# Patient Record
Sex: Male | Born: 1935 | Race: White | Hispanic: No | Marital: Married | Smoking: Former smoker
Health system: Southern US, Community
[De-identification: ages and names within clinical notes are randomized; demographics above are authoritative.]

## PROBLEM LIST (undated history)

## (undated) DIAGNOSIS — C61 Malignant neoplasm of prostate: Secondary | ICD-10-CM

## (undated) DIAGNOSIS — E1039 Type 1 diabetes mellitus with other diabetic ophthalmic complication: Secondary | ICD-10-CM

## (undated) DIAGNOSIS — I2510 Atherosclerotic heart disease of native coronary artery without angina pectoris: Secondary | ICD-10-CM

## (undated) DIAGNOSIS — I10 Essential (primary) hypertension: Secondary | ICD-10-CM

## (undated) DIAGNOSIS — E785 Hyperlipidemia, unspecified: Secondary | ICD-10-CM

## (undated) DIAGNOSIS — I6350 Cerebral infarction due to unspecified occlusion or stenosis of unspecified cerebral artery: Secondary | ICD-10-CM

## (undated) DIAGNOSIS — E875 Hyperkalemia: Secondary | ICD-10-CM

## (undated) DIAGNOSIS — K7689 Other specified diseases of liver: Secondary | ICD-10-CM

## (undated) DIAGNOSIS — N289 Disorder of kidney and ureter, unspecified: Secondary | ICD-10-CM

## (undated) DIAGNOSIS — I951 Orthostatic hypotension: Secondary | ICD-10-CM

## (undated) DIAGNOSIS — E11319 Type 2 diabetes mellitus with unspecified diabetic retinopathy without macular edema: Secondary | ICD-10-CM

## (undated) HISTORY — DX: Atherosclerotic heart disease of native coronary artery without angina pectoris: I25.10

## (undated) HISTORY — DX: Type 2 diabetes mellitus with unspecified diabetic retinopathy without macular edema: E11.319

## (undated) HISTORY — DX: Disorder of kidney and ureter, unspecified: N28.9

## (undated) HISTORY — PX: CATARACT EXTRACTION: SUR2

## (undated) HISTORY — PX: CHOLECYSTECTOMY: SHX55

## (undated) HISTORY — DX: Cerebral infarction due to unspecified occlusion or stenosis of unspecified cerebral artery: I63.50

## (undated) HISTORY — DX: Hyperlipidemia, unspecified: E78.5

## (undated) HISTORY — DX: Malignant neoplasm of prostate: C61

## (undated) HISTORY — PX: HERNIA REPAIR: SHX51

## (undated) HISTORY — DX: Hyperkalemia: E87.5

## (undated) HISTORY — DX: Type 1 diabetes mellitus with other diabetic ophthalmic complication: E10.39

## (undated) HISTORY — DX: Essential (primary) hypertension: I10

## (undated) HISTORY — DX: Other specified diseases of liver: K76.89

---

## 1991-06-28 HISTORY — PX: ESOPHAGOGASTRODUODENOSCOPY: SHX1529

## 1999-06-05 ENCOUNTER — Ambulatory Visit (HOSPITAL_COMMUNITY): Admission: RE | Admit: 1999-06-05 | Discharge: 1999-06-05 | Payer: Self-pay | Admitting: Cardiovascular Disease

## 2001-01-22 HISTORY — PX: OTHER SURGICAL HISTORY: SHX169

## 2001-04-10 ENCOUNTER — Encounter: Admission: RE | Admit: 2001-04-10 | Discharge: 2001-04-10 | Payer: Self-pay | Admitting: *Deleted

## 2002-12-03 ENCOUNTER — Encounter: Payer: Self-pay | Admitting: Endocrinology

## 2002-12-03 ENCOUNTER — Encounter: Admission: RE | Admit: 2002-12-03 | Discharge: 2002-12-03 | Payer: Self-pay | Admitting: Endocrinology

## 2002-12-15 ENCOUNTER — Encounter: Admission: RE | Admit: 2002-12-15 | Discharge: 2003-03-15 | Payer: Self-pay | Admitting: Endocrinology

## 2004-09-12 HISTORY — PX: OTHER SURGICAL HISTORY: SHX169

## 2004-10-23 ENCOUNTER — Ambulatory Visit: Payer: Self-pay | Admitting: Cardiology

## 2004-10-26 ENCOUNTER — Ambulatory Visit (HOSPITAL_COMMUNITY): Admission: RE | Admit: 2004-10-26 | Discharge: 2004-10-26 | Payer: Self-pay | Admitting: Cardiology

## 2004-10-29 ENCOUNTER — Ambulatory Visit (HOSPITAL_COMMUNITY): Admission: RE | Admit: 2004-10-29 | Discharge: 2004-10-29 | Payer: Self-pay | Admitting: Cardiology

## 2005-02-05 ENCOUNTER — Ambulatory Visit: Payer: Self-pay | Admitting: Endocrinology

## 2005-02-08 ENCOUNTER — Ambulatory Visit: Payer: Self-pay | Admitting: Endocrinology

## 2005-03-14 ENCOUNTER — Ambulatory Visit: Payer: Self-pay | Admitting: Internal Medicine

## 2005-04-03 ENCOUNTER — Ambulatory Visit: Payer: Self-pay | Admitting: Endocrinology

## 2005-05-15 ENCOUNTER — Ambulatory Visit: Payer: Self-pay | Admitting: Endocrinology

## 2005-05-27 ENCOUNTER — Ambulatory Visit: Admission: RE | Admit: 2005-05-27 | Discharge: 2005-08-28 | Payer: Self-pay | Admitting: Radiation Oncology

## 2005-06-26 ENCOUNTER — Ambulatory Visit: Payer: Self-pay | Admitting: Internal Medicine

## 2005-08-26 ENCOUNTER — Ambulatory Visit: Payer: Self-pay | Admitting: Internal Medicine

## 2005-09-02 ENCOUNTER — Ambulatory Visit: Payer: Self-pay | Admitting: Internal Medicine

## 2005-09-24 ENCOUNTER — Ambulatory Visit: Payer: Self-pay | Admitting: Endocrinology

## 2005-12-09 ENCOUNTER — Ambulatory Visit: Payer: Self-pay | Admitting: Endocrinology

## 2005-12-24 ENCOUNTER — Ambulatory Visit: Payer: Self-pay | Admitting: Endocrinology

## 2006-02-11 ENCOUNTER — Emergency Department (HOSPITAL_COMMUNITY): Admission: EM | Admit: 2006-02-11 | Discharge: 2006-02-11 | Payer: Self-pay | Admitting: Emergency Medicine

## 2006-02-14 ENCOUNTER — Ambulatory Visit: Payer: Self-pay | Admitting: Internal Medicine

## 2006-03-03 ENCOUNTER — Ambulatory Visit: Payer: Self-pay | Admitting: Internal Medicine

## 2006-04-08 ENCOUNTER — Ambulatory Visit: Payer: Self-pay | Admitting: Endocrinology

## 2006-04-16 ENCOUNTER — Ambulatory Visit: Payer: Self-pay | Admitting: Internal Medicine

## 2006-04-18 ENCOUNTER — Ambulatory Visit: Payer: Self-pay | Admitting: Endocrinology

## 2006-06-24 ENCOUNTER — Ambulatory Visit: Payer: Self-pay | Admitting: Endocrinology

## 2006-07-23 ENCOUNTER — Ambulatory Visit: Payer: Self-pay | Admitting: Internal Medicine

## 2006-08-20 ENCOUNTER — Ambulatory Visit: Payer: Self-pay | Admitting: Endocrinology

## 2006-09-29 ENCOUNTER — Ambulatory Visit: Payer: Self-pay | Admitting: Endocrinology

## 2006-09-29 LAB — CONVERTED CEMR LAB
ALT: 35 units/L (ref 0–40)
AST: 49 units/L — ABNORMAL HIGH (ref 0–37)
Albumin: 3.6 g/dL (ref 3.5–5.2)
Alkaline Phosphatase: 87 units/L (ref 39–117)
BUN: 34 mg/dL — ABNORMAL HIGH (ref 6–23)
Bilirubin, Direct: 0.2 mg/dL (ref 0.0–0.3)
CO2: 26 meq/L (ref 19–32)
Calcium: 8.9 mg/dL (ref 8.4–10.5)
Chloride: 112 meq/L (ref 96–112)
Chol/HDL Ratio, serum: 3
Cholesterol: 107 mg/dL (ref 0–200)
Creatinine, Ser: 1.7 mg/dL — ABNORMAL HIGH (ref 0.4–1.5)
GFR calc non Af Amer: 41 mL/min
Glomerular Filtration Rate, Af Am: 50 mL/min/{1.73_m2}
Glucose, Bld: 176 mg/dL — ABNORMAL HIGH (ref 70–99)
HDL: 35.7 mg/dL — ABNORMAL LOW (ref >39.0)
Hgb A1c MFr Bld: 7.4 % — ABNORMAL HIGH (ref 4.6–6.0)
LDL Cholesterol: 61 mg/dL (ref 0–99)
Potassium: 4.8 meq/L (ref 3.5–5.1)
Sodium: 143 meq/L (ref 135–145)
Total Bilirubin: 0.8 mg/dL (ref 0.3–1.2)
Total Protein: 7 g/dL (ref 6.0–8.3)
Triglyceride fasting, serum: 51 mg/dL (ref 0–149)
VLDL: 10 mg/dL (ref 0–40)

## 2006-10-08 ENCOUNTER — Ambulatory Visit: Payer: Self-pay | Admitting: Endocrinology

## 2006-10-31 ENCOUNTER — Ambulatory Visit: Payer: Self-pay | Admitting: Internal Medicine

## 2007-01-14 ENCOUNTER — Ambulatory Visit: Payer: Self-pay | Admitting: Endocrinology

## 2007-01-14 LAB — CONVERTED CEMR LAB
ALT: 27 units/L (ref 0–40)
Bilirubin, Direct: 0.1 mg/dL (ref 0.0–0.3)
HCV Ab: NEGATIVE
Total Bilirubin: 0.9 mg/dL (ref 0.3–1.2)
Total Protein: 7.2 g/dL (ref 6.0–8.3)

## 2007-01-27 ENCOUNTER — Ambulatory Visit: Payer: Self-pay | Admitting: Endocrinology

## 2007-04-15 ENCOUNTER — Ambulatory Visit: Payer: Self-pay | Admitting: Endocrinology

## 2007-04-27 ENCOUNTER — Ambulatory Visit: Payer: Self-pay | Admitting: Endocrinology

## 2007-06-13 ENCOUNTER — Encounter: Payer: Self-pay | Admitting: Endocrinology

## 2007-06-13 DIAGNOSIS — I10 Essential (primary) hypertension: Secondary | ICD-10-CM | POA: Insufficient documentation

## 2007-06-13 DIAGNOSIS — I2510 Atherosclerotic heart disease of native coronary artery without angina pectoris: Secondary | ICD-10-CM

## 2007-06-13 DIAGNOSIS — E785 Hyperlipidemia, unspecified: Secondary | ICD-10-CM

## 2007-06-13 HISTORY — DX: Hyperlipidemia, unspecified: E78.5

## 2007-06-13 HISTORY — DX: Atherosclerotic heart disease of native coronary artery without angina pectoris: I25.10

## 2007-06-13 HISTORY — DX: Essential (primary) hypertension: I10

## 2007-07-21 ENCOUNTER — Ambulatory Visit: Payer: Self-pay | Admitting: Endocrinology

## 2007-07-27 ENCOUNTER — Ambulatory Visit: Payer: Self-pay | Admitting: Endocrinology

## 2007-07-27 LAB — CONVERTED CEMR LAB
Bilirubin Urine: NEGATIVE
Leukocytes, UA: NEGATIVE
Specific Gravity, Urine: 1.01 (ref 1.000–1.03)
Total Protein, Urine: NEGATIVE mg/dL
Urobilinogen, UA: 1 (ref 0.0–1.0)
pH: 6.5 (ref 5.0–8.0)

## 2007-08-04 ENCOUNTER — Ambulatory Visit: Payer: Self-pay | Admitting: Endocrinology

## 2007-10-21 ENCOUNTER — Telehealth: Payer: Self-pay | Admitting: Endocrinology

## 2008-02-01 ENCOUNTER — Ambulatory Visit: Payer: Self-pay | Admitting: Endocrinology

## 2008-02-03 ENCOUNTER — Telehealth (INDEPENDENT_AMBULATORY_CARE_PROVIDER_SITE_OTHER): Payer: Self-pay | Admitting: *Deleted

## 2008-03-03 ENCOUNTER — Ambulatory Visit: Payer: Self-pay | Admitting: Endocrinology

## 2008-03-04 LAB — CONVERTED CEMR LAB
BUN: 34 mg/dL — ABNORMAL HIGH (ref 6–23)
CO2: 26 meq/L (ref 19–32)
Calcium: 9.3 mg/dL (ref 8.4–10.5)
Cholesterol: 114 mg/dL (ref 0–200)
Creatinine, Ser: 1.4 mg/dL (ref 0.4–1.5)
GFR calc Af Amer: 63 mL/min
Glucose, Bld: 114 mg/dL — ABNORMAL HIGH (ref 70–99)
HDL: 31.7 mg/dL — ABNORMAL LOW (ref >39.0)
Triglycerides: 98 mg/dL (ref 0–149)
VLDL: 20 mg/dL (ref 0–40)

## 2008-03-07 ENCOUNTER — Telehealth: Payer: Self-pay | Admitting: Endocrinology

## 2008-05-02 ENCOUNTER — Encounter: Payer: Self-pay | Admitting: Endocrinology

## 2008-06-01 ENCOUNTER — Ambulatory Visit: Payer: Self-pay | Admitting: Endocrinology

## 2008-06-01 DIAGNOSIS — C44309 Unspecified malignant neoplasm of skin of other parts of face: Secondary | ICD-10-CM | POA: Insufficient documentation

## 2008-06-01 DIAGNOSIS — Z8673 Personal history of transient ischemic attack (TIA), and cerebral infarction without residual deficits: Secondary | ICD-10-CM | POA: Insufficient documentation

## 2008-06-01 DIAGNOSIS — E1039 Type 1 diabetes mellitus with other diabetic ophthalmic complication: Secondary | ICD-10-CM

## 2008-06-01 DIAGNOSIS — E875 Hyperkalemia: Secondary | ICD-10-CM

## 2008-06-01 DIAGNOSIS — C61 Malignant neoplasm of prostate: Secondary | ICD-10-CM | POA: Insufficient documentation

## 2008-06-01 DIAGNOSIS — C44300 Unspecified malignant neoplasm of skin of unspecified part of face: Secondary | ICD-10-CM | POA: Insufficient documentation

## 2008-06-01 DIAGNOSIS — N289 Disorder of kidney and ureter, unspecified: Secondary | ICD-10-CM

## 2008-06-01 DIAGNOSIS — I6350 Cerebral infarction due to unspecified occlusion or stenosis of unspecified cerebral artery: Secondary | ICD-10-CM

## 2008-06-01 HISTORY — DX: Cerebral infarction due to unspecified occlusion or stenosis of unspecified cerebral artery: I63.50

## 2008-06-01 HISTORY — DX: Type 1 diabetes mellitus with other diabetic ophthalmic complication: E10.39

## 2008-06-01 HISTORY — DX: Malignant neoplasm of prostate: C61

## 2008-06-01 HISTORY — DX: Hyperkalemia: E87.5

## 2008-06-01 HISTORY — DX: Disorder of kidney and ureter, unspecified: N28.9

## 2008-06-01 LAB — CONVERTED CEMR LAB
Hgb A1c MFr Bld: 7.1 % — ABNORMAL HIGH (ref 4.6–6.0)
PSA: 0.3 ng/mL (ref 0.10–4.00)

## 2008-06-23 ENCOUNTER — Telehealth (INDEPENDENT_AMBULATORY_CARE_PROVIDER_SITE_OTHER): Payer: Self-pay | Admitting: *Deleted

## 2008-06-27 ENCOUNTER — Telehealth (INDEPENDENT_AMBULATORY_CARE_PROVIDER_SITE_OTHER): Payer: Self-pay | Admitting: *Deleted

## 2008-06-30 ENCOUNTER — Ambulatory Visit: Payer: Self-pay | Admitting: Endocrinology

## 2008-08-30 ENCOUNTER — Ambulatory Visit: Payer: Self-pay | Admitting: Endocrinology

## 2008-08-30 LAB — CONVERTED CEMR LAB: Hgb A1c MFr Bld: 7.2 % — ABNORMAL HIGH (ref 4.6–6.0)

## 2008-11-29 ENCOUNTER — Ambulatory Visit: Payer: Self-pay | Admitting: Endocrinology

## 2009-03-01 ENCOUNTER — Ambulatory Visit: Payer: Self-pay | Admitting: Endocrinology

## 2009-05-31 ENCOUNTER — Ambulatory Visit: Payer: Self-pay | Admitting: Endocrinology

## 2009-05-31 LAB — CONVERTED CEMR LAB
ALT: 36 units/L (ref 0–53)
BUN: 28 mg/dL — ABNORMAL HIGH (ref 6–23)
Bilirubin Urine: NEGATIVE
Bilirubin, Direct: 0.1 mg/dL (ref 0.0–0.3)
Chloride: 107 meq/L (ref 96–112)
Cholesterol: 125 mg/dL (ref 0–200)
Creatinine, Ser: 1.4 mg/dL (ref 0.4–1.5)
Creatinine,U: 102 mg/dL
Eosinophils Absolute: 0.1 10*3/uL (ref 0.0–0.7)
Eosinophils Relative: 2.5 % (ref 0.0–5.0)
HDL: 29 mg/dL — ABNORMAL LOW (ref >39.00)
Hgb A1c MFr Bld: 7.6 % — ABNORMAL HIGH (ref 4.6–6.5)
Ketones, ur: NEGATIVE mg/dL
LDL Cholesterol: 78 mg/dL (ref 0–99)
Leukocytes, UA: NEGATIVE
MCV: 92.4 fL (ref 78.0–100.0)
Microalb Creat Ratio: 108.8 mg/g — ABNORMAL HIGH (ref 0.0–30.0)
Microalb, Ur: 11.1 mg/dL — ABNORMAL HIGH (ref 0.0–1.9)
Monocytes Absolute: 0.4 10*3/uL (ref 0.1–1.0)
Neutrophils Relative %: 59.8 % (ref 43.0–77.0)
PSA: 0.34 ng/mL (ref 0.10–4.00)
Platelets: 116 10*3/uL — ABNORMAL LOW (ref 150.0–400.0)
Total Bilirubin: 0.7 mg/dL (ref 0.3–1.2)
Triglycerides: 90 mg/dL (ref 0.0–149.0)
Urobilinogen, UA: 1 (ref 0.0–1.0)
VLDL: 18 mg/dL (ref 0.0–40.0)
WBC: 4.5 10*3/uL (ref 4.5–10.5)

## 2009-06-01 ENCOUNTER — Telehealth (INDEPENDENT_AMBULATORY_CARE_PROVIDER_SITE_OTHER): Payer: Self-pay

## 2009-06-05 ENCOUNTER — Encounter: Payer: Self-pay | Admitting: Cardiovascular Disease

## 2009-06-05 ENCOUNTER — Ambulatory Visit: Payer: Self-pay

## 2009-06-05 ENCOUNTER — Encounter (INDEPENDENT_AMBULATORY_CARE_PROVIDER_SITE_OTHER): Payer: Self-pay

## 2009-06-19 ENCOUNTER — Encounter: Payer: Self-pay | Admitting: Internal Medicine

## 2009-06-19 ENCOUNTER — Ambulatory Visit: Payer: Self-pay | Admitting: Internal Medicine

## 2009-06-26 ENCOUNTER — Ambulatory Visit: Payer: Self-pay | Admitting: Internal Medicine

## 2009-06-27 LAB — CONVERTED CEMR LAB
BUN: 43 mg/dL — ABNORMAL HIGH (ref 6–23)
CO2: 28 meq/L (ref 19–32)
Chloride: 107 meq/L (ref 96–112)
Creatinine, Ser: 1.7 mg/dL — ABNORMAL HIGH (ref 0.4–1.5)

## 2009-07-03 ENCOUNTER — Ambulatory Visit: Payer: Self-pay | Admitting: Internal Medicine

## 2009-07-06 LAB — CONVERTED CEMR LAB
BUN: 40 mg/dL — ABNORMAL HIGH (ref 6–23)
CO2: 27 meq/L (ref 19–32)
Calcium: 8.9 mg/dL (ref 8.4–10.5)
GFR calc non Af Amer: 41.1 mL/min (ref >60)
Glucose, Bld: 134 mg/dL — ABNORMAL HIGH (ref 70–99)
Potassium: 5 meq/L (ref 3.5–5.1)

## 2009-07-10 ENCOUNTER — Telehealth (INDEPENDENT_AMBULATORY_CARE_PROVIDER_SITE_OTHER): Payer: Self-pay | Admitting: *Deleted

## 2009-08-30 ENCOUNTER — Ambulatory Visit: Payer: Self-pay | Admitting: Endocrinology

## 2009-11-25 ENCOUNTER — Emergency Department (HOSPITAL_COMMUNITY): Admission: EM | Admit: 2009-11-25 | Discharge: 2009-11-25 | Payer: Self-pay | Admitting: Family Medicine

## 2009-11-25 ENCOUNTER — Encounter: Payer: Self-pay | Admitting: Internal Medicine

## 2009-11-27 ENCOUNTER — Ambulatory Visit: Payer: Self-pay | Admitting: Internal Medicine

## 2009-11-27 DIAGNOSIS — J189 Pneumonia, unspecified organism: Secondary | ICD-10-CM | POA: Insufficient documentation

## 2009-12-13 ENCOUNTER — Ambulatory Visit: Payer: Self-pay | Admitting: Endocrinology

## 2010-02-06 ENCOUNTER — Encounter: Payer: Self-pay | Admitting: Endocrinology

## 2010-02-06 ENCOUNTER — Ambulatory Visit: Payer: Self-pay | Admitting: Endocrinology

## 2010-02-06 DIAGNOSIS — R05 Cough: Secondary | ICD-10-CM

## 2010-02-12 ENCOUNTER — Telehealth: Payer: Self-pay | Admitting: Endocrinology

## 2010-04-12 ENCOUNTER — Ambulatory Visit: Payer: Self-pay | Admitting: Endocrinology

## 2010-04-12 LAB — CONVERTED CEMR LAB
Microalb Creat Ratio: 4.3 mg/g (ref 0.0–30.0)
Microalb, Ur: 1.7 mg/dL (ref 0.0–1.9)

## 2010-04-24 ENCOUNTER — Telehealth: Payer: Self-pay | Admitting: Endocrinology

## 2010-05-04 ENCOUNTER — Encounter (INDEPENDENT_AMBULATORY_CARE_PROVIDER_SITE_OTHER): Payer: Self-pay | Admitting: *Deleted

## 2010-07-17 ENCOUNTER — Ambulatory Visit: Payer: Self-pay | Admitting: Endocrinology

## 2010-07-17 ENCOUNTER — Encounter: Payer: Self-pay | Admitting: Endocrinology

## 2010-07-17 DIAGNOSIS — K7689 Other specified diseases of liver: Secondary | ICD-10-CM

## 2010-07-17 DIAGNOSIS — K7581 Nonalcoholic steatohepatitis (NASH): Secondary | ICD-10-CM

## 2010-07-17 HISTORY — DX: Other specified diseases of liver: K76.89

## 2010-07-24 ENCOUNTER — Ambulatory Visit: Payer: Self-pay | Admitting: Pulmonary Disease

## 2010-10-16 ENCOUNTER — Ambulatory Visit: Payer: Self-pay | Admitting: Endocrinology

## 2010-10-16 LAB — CONVERTED CEMR LAB
BUN: 36 mg/dL — ABNORMAL HIGH (ref 6–23)
CO2: 29 meq/L (ref 19–32)
Calcium: 8.8 mg/dL (ref 8.4–10.5)
GFR calc non Af Amer: 44.91 mL/min (ref >60)
Glucose, Bld: 123 mg/dL — ABNORMAL HIGH (ref 70–99)
Potassium: 4.6 meq/L (ref 3.5–5.1)
Sodium: 140 meq/L (ref 135–145)

## 2010-11-26 ENCOUNTER — Ambulatory Visit
Admission: RE | Admit: 2010-11-26 | Discharge: 2010-11-26 | Payer: Self-pay | Source: Home / Self Care | Attending: Internal Medicine | Admitting: Internal Medicine

## 2010-12-16 LAB — CONVERTED CEMR LAB
BUN: 48 mg/dL — ABNORMAL HIGH (ref 6–23)
Basophils Absolute: 0 10*3/uL (ref 0.0–0.1)
Bilirubin Urine: NEGATIVE
Bilirubin, Direct: 0.2 mg/dL (ref 0.0–0.3)
Chloride: 102 meq/L (ref 96–112)
Cholesterol: 124 mg/dL (ref 0–200)
Creatinine, Ser: 2.2 mg/dL — ABNORMAL HIGH (ref 0.4–1.5)
Creatinine,U: 106.3 mg/dL
Eosinophils Absolute: 0.1 10*3/uL (ref 0.0–0.7)
Eosinophils Relative: 0.9 % (ref 0.0–5.0)
Glucose, Bld: 108 mg/dL — ABNORMAL HIGH (ref 70–99)
HCT: 39.5 % (ref 39.0–52.0)
Hemoglobin, Urine: NEGATIVE
Hgb A1c MFr Bld: 6.7 % — ABNORMAL HIGH (ref 4.6–6.5)
Ketones, ur: NEGATIVE mg/dL
LDL Cholesterol: 68 mg/dL (ref 0–99)
Leukocytes, UA: NEGATIVE
Lymphs Abs: 1.4 10*3/uL (ref 0.7–4.0)
MCHC: 34.3 g/dL (ref 30.0–36.0)
MCV: 91.6 fL (ref 78.0–100.0)
Microalb Creat Ratio: 2.5 mg/g (ref 0.0–30.0)
Microalb, Ur: 2.7 mg/dL — ABNORMAL HIGH (ref 0.0–1.9)
Monocytes Absolute: 0.6 10*3/uL (ref 0.1–1.0)
Neutrophils Relative %: 70.4 % (ref 43.0–77.0)
Nitrite: NEGATIVE
Platelets: 161 10*3/uL (ref 150.0–400.0)
RDW: 14.2 % (ref 11.5–14.6)
TSH: 2.52 microintl units/mL (ref 0.35–5.50)
Total Bilirubin: 0.7 mg/dL (ref 0.3–1.2)
Triglycerides: 93 mg/dL (ref 0.0–149.0)
WBC: 6.9 10*3/uL (ref 4.5–10.5)
pH: 5 (ref 5.0–8.0)

## 2010-12-20 NOTE — Assessment & Plan Note (Signed)
Summary: F1Y   Visit Type:  Follow-up Primary Provider:  ellison  CC:  no complaints.  History of Present Illness: Mr. Chad Mcmillan is an 77 year old gentleman. He has a known history of CAD. I last saw him back in 2007. He is normally followed by [name].  Since I saw him he says he has been doing okay. He denies chest pain. No significant shortness of breath. Note his last cardiac catheterization was back in 2000 (50% LAD; 70% LAD; 90% diagonal; 70-80% diffuse mid/distal circumflex; 40% p.o. SA. LVEF at that time was 70%.  Adenosine myoview showed small inferior infarct in the base/mid level and no ischemia.  LVEF was calculated at 45%  Current Medications (verified): 1)  Aspirin 325 Mg  Tbec (Aspirin) .... Take 1 By Mouth Qd 2)  Humulin N 100 Unit/ml  Susp (Insulin Isophane Human) .... 25 Units At Bedtime 3)  Pravastatin Sodium 40 Mg  Tabs (Pravastatin Sodium) .... Take 1 By Mouth Two Times A Day 4)  Nitrostat 0.4 Mg  Subl (Nitroglycerin) .... Take Prn 5)  Onetouch Ultrasoft Lancets  Misc (Lancets) .... Use Asd Two Times A Day As Needed 6)  Novolog 100 Unit/ml Soln (Insulin Aspart) .... Use Tid (Qac) 13-0-6 Units 7)  Cvs Insulin Syringe 30g X 1/2" 0.5 Ml Misc (Insulin Syringe-Needle U-100) .... Use Qid 8)  Furosemide 20 Mg Tabs (Furosemide) .Marland Kitchen.. 1 Qd 9)  Onetouch Test  Strp (Glucose Blood) .... Three Times A Day, and Lancets 250.01.  Variable Glucoses 10)  Amlodipine Besylate 2.5 Mg Tabs (Amlodipine Besylate) .Marland Kitchen.. 1 Tab Once Daily 11)  Hydrochlorothiazide 12.5 Mg Tabs (Hydrochlorothiazide) .Marland Kitchen.. 1 Tab Once Daily 12)  Metoprolol Tartrate 25 Mg Tabs (Metoprolol Tartrate) .... 1/2 Tab Two Times A Day  Allergies (verified): No Known Drug Allergies  Past History:  Past medical, surgical, family and social histories (including risk factors) reviewed, and no changes noted (except as noted below).  Past Medical History: Reviewed history from 06/13/2007 and no changes required. Coronary  artery disease Diabetes mellitus, type I Hyperlipidemia Hypertension DM Nepharopathy Increase potassium due to DM neparopathy DM Retinopathy Increase creatinine, no glucophage Prostate cancer Left neck skin cancer CVA x 2  Past Surgical History: Reviewed history from 06/13/2007 and no changes required. Cataract extraction Cholecystectomy Right Ing. hernia EDG (06/28/1991) Rest/Stress Cardiolite (01/22/2001) Carotid duplex (09/12/04) EKG (10/31/2006)  Family History: Reviewed history and no changes required.  Social History: Reviewed history from 07/17/2010 and no changes required. Social History: non-smoker, non-drinker Retired married no illegal drugs  Review of Systems       Reviewed.  Neg to the above problem except as noted above.  Vital Signs:  Patient profile:   77 year old male Height:      72 inches Weight:      200 pounds BMI:     27.22 Pulse rate:   65 / minute BP sitting:   168 / 78  (left arm) Cuff size:   regular  Vitals Entered By: Mignon Pine, RMA (November 26, 2010 2:34 PM)  Physical Exam  Additional Exam:  Patient is in NAD HEENT:  Normocephalic, atraumatic. EOMI, PERRLA.  Neck: JVP is normal. No thyromegaly. No bruits.  Lungs: clear to auscultation. No rales no wheezes.  Heart: Regular rate and rhythm. Normal S1, S2. No S3.   No significant murmurs. PMI not displaced.  Abdomen:  Supple, nontender. Normal bowel sounds. No masses. No hepatomegaly.  Extremities:   Good distal pulses throughout. No lower extremity  edema.  Musculoskeletal :moving all extremities.  Neuro:   alert and oriented x3.    Impression & Recommendations:  Problem # 1:  CORONARY ARTERY DISEASE (ICD-414.00) No symptoism to suggest angina.  His last myoview was Fall 2010.  He is remote from CABG.   I would wait to see him in 1 year.  Prob plan screening myoview at that time to eval graft patency.  Sooner if symptoms develop.  Problem # 2:  HYPERLIPIDEMIA  (UDA-370.4) LDL was 68, HDL 37.  On pravastatin  Problem # 3:  HYPERTENSION (ICD-401.9) BP is up some today.  Usually better.  I would not change regimen.  Note last cr was 1.6 His updated medication list for this problem includes:    Aspirin 325 Mg Tbec (Aspirin) .Marland Kitchen... Take 1 by mouth qd    Furosemide 20 Mg Tabs (Furosemide) .Marland Kitchen... 1 qd    Amlodipine Besylate 2.5 Mg Tabs (Amlodipine besylate) .Marland Kitchen... 1 tab once daily    Hydrochlorothiazide 12.5 Mg Tabs (Hydrochlorothiazide) .Marland Kitchen... 1 tab once daily    Metoprolol Tartrate 25 Mg Tabs (Metoprolol tartrate) .Marland Kitchen... 1/2 tab two times a day

## 2010-12-20 NOTE — Assessment & Plan Note (Signed)
Summary: YEARLY WELLNESS/MEDICARE/ TO COME FASTING/NWS  #   Vital Signs:  Patient profile:   77 year old male Height:      72 inches (182.88 cm) Weight:      202 pounds (91.82 kg) BMI:     27.50 O2 Sat:      96 % on Room air Temp:     97.6 degrees F (36.44 degrees C) oral Pulse rate:   60 / minute Pulse rhythm:   regular BP sitting:   100 / 62  (left arm) Cuff size:   large  Vitals Entered By: Rebeca Alert MA (July 17, 2010 8:43 AM)  O2 Flow:  Room air CC: Yearly wellness/pt is is no longer taking Mucinex or using Freestyle Lancets/aj Is Patient Diabetic? Yes   Primary Linnet Bottari:  ellison  CC:  Yearly wellness/pt is is no longer taking Mucinex or using Freestyle Lancets/aj.  History of Present Illness: pt is here for medicare welllness visit.  he denies depression.  he has mild memory loss.  he says he is able to perform activities of daily living without assistance.  he has no limitations to physical activity.   Current Medications (verified): 1)  Aspirin 325 Mg  Tbec (Aspirin) .... Take 1 By Mouth Qd 2)  Humulin N 100 Unit/ml  Susp (Insulin Isophane Human) .... 25 Units At Bedtime 3)  Hydrochlorothiazide 25 Mg  Tabs (Hydrochlorothiazide) .... Take 1 By Mouth Qd 4)  Pravastatin Sodium 40 Mg  Tabs (Pravastatin Sodium) .... Take 1 By Mouth Two Times A Day 5)  Nitrostat 0.4 Mg  Subl (Nitroglycerin) .... Take Prn 6)  Freestyle Lancets  Misc (Lancets) 7)  Onetouch Ultrasoft Lancets  Misc (Lancets) .... Use Asd Two Times A Day As Needed 8)  Norvasc 5 Mg  Tabs (Amlodipine Besylate) .... Qd 9)  Metoprolol Succinate 50 Mg Xr24h-Tab (Metoprolol Succinate) .... Take 1/2 By Mouth Qd 10)  Novolog 100 Unit/ml Soln (Insulin Aspart) .... Use Tid (Qac) 13-0-8 Units 11)  Cvs Insulin Syringe 30g X 1/2" 0.5 Ml Misc (Insulin Syringe-Needle U-100) .... Use Qid 12)  Furosemide 20 Mg Tabs (Furosemide) .Marland Kitchen.. 1 Qd 13)  Onetouch Test  Strp (Glucose Blood) .... Three Times A Day, and Lancets  250.01.  Variable Glucoses 14)  Mucinex  Allergies (verified): No Known Drug Allergies  Past History:  Past Medical History: Last updated: 06/13/2007 Coronary artery disease Diabetes mellitus, type I Hyperlipidemia Hypertension DM Nepharopathy Increase potassium due to DM neparopathy DM Retinopathy Increase creatinine, no glucophage Prostate cancer Left neck skin cancer CVA x 2  Social History: Reviewed history from 11/27/2009 and no changes required. Social History: non-smoker, non-drinker Retired married no illegal drugs  Physical Exam  Eyes:  sees opthal, who does visual acuity Rectal:  sees urology  Genitalia:  sees urology  Prostate:  sees urology  Msk:  pt easily and quickly performs "get-up-and-go" from a sitting position  Psych:  remembers 3/3 at 5 minutes.  excellent recall.  can easily read and write a sentence.  alert and oriented x 3  Additional Exam:  SEPARATE EVALUATION FOLLOWS--EACH PROBLEM HERE IS NEW, NOT RESPONDING TO TREATMENT, OR POSES SIGNIFICANT RISK TO THE PATIENT'S HEALTH: HISTORY OF THE PRESENT ILLNESS: pt has renal insufficiency.  creat has been running in the mid-1's, but pt has no sxs from this he takes bp meds as rx'ed.  no dizziness he brings a record of his cbg's which i have reviewed today.  all are in the low-100's.  it  is lowest at hs.  pt states he feels well in general. [place] reviewed and up to date today REVIEW OF SYSTEMS: no hypoglycemia.  he has lost a few lbs, due to his efforts PHYSICAL EXAMINATION: see vs page dorsalis pedis intact bilat.   no deformity.  no ulcer on the feet.  feet are of normal color and temp.  trace bilat leg edema.  there are bilat heavy callouses. LAB/XRAY RESULTS: IMPRESSION: renal insufficiency, worse dm, overcontrolled htn, overcontrolled PLAN: see instruction sheet   Impression & Recommendations:  Problem # 1:  ROUTINE GENERAL MEDICAL EXAM@HEALTH  CARE FACL  (ICD-V70.0)  Medications Added to Medication List This Visit: 1)  Novolog 100 Unit/ml Soln (Insulin aspart) .... Use tid (qac) 13-0-6 units 2)  Amlodipine Besylate 2.5 Mg Tabs (Amlodipine besylate) .Marland Kitchen.. 1 tab once daily 3)  Hydrochlorothiazide 12.5 Mg Tabs (Hydrochlorothiazide) .Marland Kitchen.. 1 tab once daily  Other Orders: EKG w/ Interpretation (93000) TLB-Lipid Panel (80061-LIPID) TLB-BMP (Basic Metabolic Panel-BMET) (29562-ZHYQMVH) TLB-CBC Platelet - w/Differential (85025-CBCD) TLB-Hepatic/Liver Function Pnl (80076-HEPATIC) TLB-TSH (Thyroid Stimulating Hormone) (84443-TSH) TLB-A1C / Hgb A1C (Glycohemoglobin) (83036-A1C) TLB-Microalbumin/Creat Ratio, Urine (82043-MALB) TLB-Udip w/ Micro (81001-URINE) TLB-PSA (Prostate Specific Antigen) (84153-PSA) Est. Patient Level IV (84696) Medicare -1st Annual Wellness Visit [phone])   Patient Instructions: 1)  cc labs dr Serita Butcher. 2)  reduce amlodipine to 2.5 mg once daily. 3)  please see dr Harrington Challenger soon. 4)  blood tests are being ordered for you today.  please call [phone] to hear your test results. 5)  pending the test results, please continue the same insulin for now. 6)  Please schedule a follow-up appointment in 3 months. 7)  (update: i left message on phone-tree:  reduce hctz to 12.5 mg once daily.  we'll recheck bmet upon return.  decrease supper novolog to 6 units). Prescriptions: HYDROCHLOROTHIAZIDE 12.5 MG TABS (HYDROCHLOROTHIAZIDE) 1 tab once daily  #90 x 3   Entered and Authorized by:   Donavan Foil MD   Signed by:   Donavan Foil MD on 07/17/2010   Method used:   [facility] to        [facility] [address] [phone]* (retail)       [address]       [place], [place]  [address]       Ph: [phone] or [phone]       Fax: [phone]   RxID:   [id number] AMLODIPINE BESYLATE 2.5 MG TABS (AMLODIPINE BESYLATE) 1 tab once daily  #30 x 11   Entered and Authorized by:   Donavan Foil MD   Signed by:    Donavan Foil MD on 07/17/2010   Method used:   [facility]        [facility] [address] [phone]* (retail)       [place] [address]       [place], [place]  [address]       Ph: [phone] or [phone]       Fax: [phone]   RxID:   [id number]

## 2010-12-20 NOTE — Assessment & Plan Note (Signed)
Summary: 3 MTH FU  STC   Vital Signs:  Patient profile:   77 year old male Height:      72 inches (182.88 cm) Weight:      205.50 pounds (93.41 kg) BMI:     27.97 O2 Sat:      98 % on Room air Temp:     97.8 degrees F (36.56 degrees C) oral Pulse rate:   61 / minute Pulse rhythm:   regular BP sitting:   132 / 70  (left arm) Cuff size:   large  Vitals Entered By: Rebeca Alert CMA Deborra Medina) (October 16, 2010 1:16 PM)  O2 Flow:  Room air CC: Follow-up visit/aj Is Patient Diabetic? Yes   Primary Provider:  ellison  CC:  Follow-up visit/aj.  History of Present Illness: no cbg record, but states cbg's are occasionally mildly low before supper, but otherwise well-controlled.  no dizziness.  he wants cheaper alternative to metoprolol-xr denies sob.  Current Medications (verified): 1)  Aspirin 325 Mg  Tbec (Aspirin) .... Take 1 By Mouth Qd 2)  Humulin N 100 Unit/ml  Susp (Insulin Isophane Human) .... 25 Units At Bedtime 3)  Pravastatin Sodium 40 Mg  Tabs (Pravastatin Sodium) .... Take 1 By Mouth Two Times A Day 4)  Nitrostat 0.4 Mg  Subl (Nitroglycerin) .... Take Prn 5)  Onetouch Ultrasoft Lancets  Misc (Lancets) .... Use Asd Two Times A Day As Needed 6)  Metoprolol Succinate 50 Mg Xr24h-Tab (Metoprolol Succinate) .... Take 1/2 By Mouth Qd 7)  Novolog 100 Unit/ml Soln (Insulin Aspart) .... Use Tid (Qac) 13-0-6 Units 8)  Cvs Insulin Syringe 30g X 1/2" 0.5 Ml Misc (Insulin Syringe-Needle U-100) .... Use Qid 9)  Furosemide 20 Mg Tabs (Furosemide) .Marland Kitchen.. 1 Qd 10)  Onetouch Test  Strp (Glucose Blood) .... Three Times A Day, and Lancets 250.01.  Variable Glucoses 11)  Amlodipine Besylate 2.5 Mg Tabs (Amlodipine Besylate) .Marland Kitchen.. 1 Tab Once Daily 12)  Hydrochlorothiazide 12.5 Mg Tabs (Hydrochlorothiazide) .Marland Kitchen.. 1 Tab Once Daily  Allergies (verified): No Known Drug Allergies  Past History:  Past Medical History: Last updated: 06/13/2007 Coronary artery disease Diabetes mellitus, type  I Hyperlipidemia Hypertension DM Nepharopathy Increase potassium due to DM neparopathy DM Retinopathy Increase creatinine, no glucophage Prostate cancer Left neck skin cancer CVA x 2  Review of Systems  The patient denies chest pain and syncope.    Physical Exam  General:  obese.  no distress  Extremities:  1+ right pedal edema and 1+ left pedal edema.   Additional Exam:  Hemoglobin A1C       [H]  6.6 %     Impression & Recommendations:  Problem # 1:  DIABETES MELLITUS, TYPE I, WITH OPHTHALMIC COMPLICATIONS (HUD-149.70) well-controlled  Problem # 2:  HYPERTENSION (ICD-401.9) well-controlled, but wants cheaper med.  Medications Added to Medication List This Visit: 1)  Metoprolol Tartrate 25 Mg Tabs (Metoprolol tartrate) .... 1/2 tab two times a day  Other Orders: TLB-BMP (Basic Metabolic Panel-BMET) (26378-HYIFOYD) TLB-A1C / Hgb A1C (Glycohemoglobin) (83036-A1C) Est. Patient Level III (74128)  Patient Instructions: 1)  blood tests are being ordered for you today.  please call [phone] to hear your test results. 2)  pending the test results, please change metoprolol to immediate-release, 1/2 of 25 mg two times a day. 3)  Please schedule a follow-up appointment in 3 months. 4)  (update: i left message on phone-tree:  rx as we discussed) Prescriptions: METOPROLOL TARTRATE 25 MG TABS (METOPROLOL TARTRATE) 1/2 tab  two times a day  #30 x 11   Entered and Authorized by:   Donavan Foil MD   Signed by:   Donavan Foil MD on 10/16/2010   Method used:   [facility]        [facility] [address] [phone]* (retail)       [address]       [place], [place]  [address]       Ph: [phone] or [phone]       Fax: [phone]   RxID:   [id number]    Orders Added: 1)  TLB-BMP (Basic Metabolic Panel-BMET) [42998-SYHNPMV] 2)  TLB-A1C / Hgb A1C (Glycohemoglobin) [83036-A1C] 3)  Est. Patient Level III [22773]

## 2010-12-20 NOTE — Assessment & Plan Note (Signed)
Summary: FLU vaccine given  Nurse Visit   Allergies: No Known Drug Allergies  Orders Added: 1)  Flu Vaccine 19yrs + MEDICARE PATIENTS [Q2039] 2)  Administration Flu vaccine - MCR [id number]   Flu Vaccine Consent Questions     Do you have a history of severe allergic reactions to this vaccine? no    Any prior history of allergic reactions to egg and/or gelatin? no    Do you have a sensitivity to the preservative Thimersol? no    Do you have a past history of Guillan-Barre Syndrome? no    Do you currently have an acute febrile illness? no    Have you ever had a severe reaction to latex? no    Vaccine information given and explained to patient? yes    Are you currently pregnant? no    Lot Number:[id number]   Exp Date:05/18/2011   Site Given  Left Deltoid IMu  Parke Poisson CNA/MA  July 24, 2010 11:51 AM

## 2010-12-20 NOTE — Assessment & Plan Note (Signed)
Summary: 3 MTH FU  STC   Vital Signs:  Patient profile:   77 year old male Height:      72 inches (182.88 cm) Weight:      215.25 pounds (97.84 kg) O2 Sat:      98 % on Room air Temp:     96.8 degrees F (36 degrees C) oral Pulse rate:   65 / minute BP sitting:   132 / 58  (left arm) Cuff size:   large  Vitals Entered By: Gardenia Phlegm CMA (December 13, 2009 10:25 AM)  O2 Flow:  Room air CC: 3 month follow up/ pt had pneumonia on 11/25/09 and would like an xray today to make sure its gone/ CF Is Patient Diabetic? Yes   Primary Provider:  Terran Hollenkamp  CC:  3 month follow up/ pt had pneumonia on 11/25/09 and would like an xray today to make sure its gone/ CF.  History of Present Illness: pt says he feels much better since his recent pneumonia. he brings a record of his cbg's which i have reviewed today.  it varies from 100 (afternoon) up to 180 (am).  Current Medications (verified): 1)  Aspirin 325 Mg  Tbec (Aspirin) .... Take 1 By Mouth Qd 2)  Humulin N 100 Unit/ml  Susp (Insulin Isophane Human) .... 20 Units Qhs 3)  Hydrochlorothiazide 25 Mg  Tabs (Hydrochlorothiazide) .... Take 1 By Mouth Qd 4)  Pravastatin Sodium 40 Mg  Tabs (Pravastatin Sodium) .... Take 1 By Mouth Two Times A Day 5)  Nitrostat 0.4 Mg  Subl (Nitroglycerin) .... Take Prn 6)  Freestyle Lancets  Misc (Lancets) 7)  Onetouch Ultrasoft Lancets  Misc (Lancets) .... Use Asd Two Times A Day As Needed 8)  Norvasc 5 Mg  Tabs (Amlodipine Besylate) .... Qd 9)  Metoprolol Succinate 50 Mg Xr24h-Tab (Metoprolol Succinate) .... Take 1/2 By Mouth Qd 10)  Novolog 100 Unit/ml Soln (Insulin Aspart) .... Use Tid (Qac) 13-0-9 Units 11)  Cvs Insulin Syringe 30g X 1/2" 0.5 Ml Misc (Insulin Syringe-Needle U-100) .... Use Qid 12)  Furosemide 20 Mg Tabs (Furosemide) .Marland Kitchen.. 1 Qd 13)  Lisinopril 10 Mg Tabs (Lisinopril) .... (1/2) Daily 14)  Onetouch Test  Strp (Glucose Blood) .... Test Up To 4 Times A Day  Allergies (verified): No Known  Drug Allergies  Past History:  Past Medical History: Last updated: 06/13/2007 Coronary artery disease Diabetes mellitus, type I Hyperlipidemia Hypertension DM Nepharopathy Increase potassium due to DM neparopathy DM Retinopathy Increase creatinine, no glucophage Prostate cancer Left neck skin cancer CVA x 2  Review of Systems  The patient denies hypoglycemia and dyspnea on exertion.    Physical Exam  General:  normal appearance.   Lungs:  Clear to auscultation bilaterally. Normal respiratory effort.  Additional Exam:   Hemoglobin A1C       [H]  7.7 %    cxr: much better   Impression & Recommendations:  Problem # 1:  DIABETES MELLITUS, TYPE I, WITH OPHTHALMIC COMPLICATIONS (123XX123) needs increased rx  Problem # 2:  PNEUMONIA, ORGANISM UNSPECIFIED (ICD-486) Assessment: Improved  Medications Added to Medication List This Visit: 1)  Humulin N 100 Unit/ml Susp (Insulin isophane human) .... 22 units qhs  Other Orders: T-2 View CXR ([id number]) TLB-A1C / Hgb A1C (Glycohemoglobin) (83036-A1C) Est. Patient Level III SJ:[id number])  Patient Instructions: 1)  Please schedule a follow-up appointment in 3 months. 2)  continue nph 20 units at night. 3)  continue novolog to (just before each meal) 13-0-9  4)  tests are being ordered for you today.  a few days after the test(s), please call [phone] to hear your test results.  5)  chest x ray and blood test today. 6)  (update: i left message on phone-tree:  increase nph to 22 units at bedtime)

## 2010-12-20 NOTE — Letter (Signed)
Summary: Appointment - [facility] [place] [place]  [address] [place]   [place], [place] [address]   Phone: [phone]  Fax: [phone]     May 04, 2010 MRN: [id number]   [facility] [place], [place]  [address]   Dear Mr. COMERFORD,  Our records indicate that it is time to schedule a follow-up appointment with [name]. It is very important that we reach you to schedule this appointment. We look forward to participating in your health care needs. Please contact us at the number listed above at your earliest convenience to schedule your appointment.  If you are unable to make an appointment at this time, give [place] a call so we can update our records.     Sincerely,    Darnell Level [facility] Scheduling Team

## 2010-12-20 NOTE — Letter (Signed)
Summary: Cough/Call-A-Nurse  Cough/Call-A-Nurse   Imported By: Bubba Hales 11/30/2009 12:05:03  _____________________________________________________________________  External Attachment:    Type:   Image     Comment:   External Document

## 2010-12-20 NOTE — Assessment & Plan Note (Signed)
Summary: COLD/COUGH/2WKS /NO FEVER /NWS   Vital Signs:  Patient profile:   77 year old male Height:      72 inches (182.88 cm) Weight:      212.25 pounds (96.48 kg) O2 Sat:      97 % on Room air Temp:     97.2 degrees F (36.22 degrees C) oral Pulse rate:   75 / minute BP sitting:   120 / 58  (left arm) Cuff size:   large  Vitals Entered By: Gardenia Phlegm RMA (February 06, 2010 1:13 PM)  O2 Flow:  Room air CC: Cold, Cough X2weeks/ CF Is Patient Diabetic? Yes   Primary Provider:  Rinda Mcmillan  CC:  Cold and Cough X2weeks/ CF.  History of Present Illness: pt states persistent dry cough, and slight doe.  he feels this is due to a sensation in the throat.   Current Medications (verified): 1)  Aspirin 325 Mg  Tbec (Aspirin) .... Take 1 By Mouth Qd 2)  Humulin N 100 Unit/ml  Susp (Insulin Isophane Human) .... 22 Units Qhs 3)  Hydrochlorothiazide 25 Mg  Tabs (Hydrochlorothiazide) .... Take 1 By Mouth Qd 4)  Pravastatin Sodium 40 Mg  Tabs (Pravastatin Sodium) .... Take 1 By Mouth Two Times A Day 5)  Nitrostat 0.4 Mg  Subl (Nitroglycerin) .... Take Prn 6)  Freestyle Lancets  Misc (Lancets) 7)  Onetouch Ultrasoft Lancets  Misc (Lancets) .... Use Asd Two Times A Day As Needed 8)  Norvasc 5 Mg  Tabs (Amlodipine Besylate) .... Qd 9)  Metoprolol Succinate 50 Mg Xr24h-Tab (Metoprolol Succinate) .... Take 1/2 By Mouth Qd 10)  Novolog 100 Unit/ml Soln (Insulin Aspart) .... Use Tid (Qac) 13-0-9 Units 11)  Cvs Insulin Syringe 30g X 1/2" 0.5 Ml Misc (Insulin Syringe-Needle U-100) .... Use Qid 12)  Furosemide 20 Mg Tabs (Furosemide) .Marland Kitchen.. 1 Qd 13)  Lisinopril 10 Mg Tabs (Lisinopril) .... (1/2) Daily 14)  Onetouch Test  Strp (Glucose Blood) .... Test Up To 4 Times A Day 15)  Robitussin 16)  Mucinex  Allergies (verified): No Known Drug Allergies  Past History:  Past Medical History: Last updated: 06/13/2007 Coronary artery disease Diabetes mellitus, type I Hyperlipidemia Hypertension DM  Nepharopathy Increase potassium due to DM neparopathy DM Retinopathy Increase creatinine, no glucophage Prostate cancer Left neck skin cancer CVA x 2  Review of Systems  The patient denies fever.         denies earache.  Physical Exam  General:  normal appearance.   Head:  head: no deformity eyes: no periorbital swelling, no proptosis external nose and ears are normal mouth: no lesion seen Lungs:  few exp wheezes. Additional Exam:  CHEST - 2 VIEW No acute disease.   Impression & Recommendations:  Problem # 1:  COUGH (ICD-786.2) due to acei and uri  Medications Added to Medication List This Visit: 1)  Robitussin  2)  Mucinex  3)  Azithromycin 500 Mg Tabs (Azithromycin) .Marland Kitchen.. 1 once daily 4)  Promethazine-codeine 6.25-10 Mg/73ml Syrp (Promethazine-codeine) .Marland Kitchen.. 1 teaspoon every 4 hrs as needed for cough  Other Orders: T-2 View CXR (71020TC) Est. Patient Level III [id number])  Patient Instructions: 1)  stop lisinopril 2)  here is a sample of symbicort-160, to take 1 puff two times a day.  rinse your mouth after using each time. 3)  azithromycin 500 mg once daily 4)  promethazine-codeine 1 teaspoon every 4 hrs as needed for cough. 5)  return next week if not better Prescriptions: PROMETHAZINE-CODEINE 6.25-10  MG/5ML SYRP (PROMETHAZINE-CODEINE) 1 teaspoon every 4 hrs as needed for cough  #8 oz x 1   Entered and Authorized by:   Donavan Foil MD   Signed by:   Donavan Foil MD on 02/06/2010   Method used:   Print then Give to Patient   RxID:   [id number] AZITHROMYCIN 500 MG TABS (AZITHROMYCIN) 1 once daily  #6 x 0   Entered and Authorized by:   Donavan Foil MD   Signed by:   Donavan Foil MD on 02/06/2010   Method used:   Print then Give to Patient   RxID:   [id number]

## 2010-12-20 NOTE — Assessment & Plan Note (Signed)
Summary: PER PT 4 MTH FU---STC   Vital Signs:  Patient profile:   77 year old male Height:      72 inches (182.88 cm) Weight:      221.13 pounds (100.51 kg) BMI:     30.10 O2 Sat:      95 % on Room air Temp:     98.0 degrees F (36.67 degrees C) oral Pulse rate:   65 / minute BP standing:   162 / 62  (left arm) Cuff size:   large  Vitals Entered By: Gardenia Phlegm RMA (Apr 12, 2010 10:31 AM)  O2 Flow:  Room air CC: 4 month follow up/ pt states he doesn't use the Freestyle Lancets/ pt states he is no longer taking azithromycin or Promethazine/ CF Is Patient Diabetic? Yes   Primary Provider:  Icelynn Onken  CC:  4 month follow up/ pt states he doesn't use the Freestyle Lancets/ pt states he is no longer taking azithromycin or Promethazine/ CF.  History of Present Illness: pt states he feels well in general.  he brings a record of his cbg's which i have reviewed today.  it varies from 90 (hs) to mid-100's (breakfast and lunch).  he says he does not eat hs-snack.    Current Medications (verified): 1)  Aspirin 325 Mg  Tbec (Aspirin) .... Take 1 By Mouth Qd 2)  Humulin N 100 Unit/ml  Susp (Insulin Isophane Human) .... 22 Units Qhs 3)  Hydrochlorothiazide 25 Mg  Tabs (Hydrochlorothiazide) .... Take 1 By Mouth Qd 4)  Pravastatin Sodium 40 Mg  Tabs (Pravastatin Sodium) .... Take 1 By Mouth Two Times A Day 5)  Nitrostat 0.4 Mg  Subl (Nitroglycerin) .... Take Prn 6)  Freestyle Lancets  Misc (Lancets) 7)  Onetouch Ultrasoft Lancets  Misc (Lancets) .... Use Asd Two Times A Day As Needed 8)  Norvasc 5 Mg  Tabs (Amlodipine Besylate) .... Qd 9)  Metoprolol Succinate 50 Mg Xr24h-Tab (Metoprolol Succinate) .... Take 1/2 By Mouth Qd 10)  Novolog 100 Unit/ml Soln (Insulin Aspart) .... Use Tid (Qac) 13-0-9 Units 11)  Cvs Insulin Syringe 30g X 1/2" 0.5 Ml Misc (Insulin Syringe-Needle U-100) .... Use Qid 12)  Furosemide 20 Mg Tabs (Furosemide) .Marland Kitchen.. 1 Qd 13)  Onetouch Test  Strp (Glucose Blood) .... Test  Up To 4 Times A Day 14)  Mucinex 15)  Azithromycin 500 Mg Tabs (Azithromycin) .Marland Kitchen.. 1 Once Daily 16)  Promethazine-Codeine 6.25-10 Mg/74ml Syrp (Promethazine-Codeine) .Marland Kitchen.. 1 Teaspoon Every 4 Hrs As Needed For Cough  Allergies (verified): No Known Drug Allergies  Past History:  Past Medical History: Last updated: 06/13/2007 Coronary artery disease Diabetes mellitus, type I Hyperlipidemia Hypertension DM Nepharopathy Increase potassium due to DM neparopathy DM Retinopathy Increase creatinine, no glucophage Prostate cancer Left neck skin cancer CVA x 2  Review of Systems  The patient denies hypoglycemia.    Physical Exam  General:  obese.  no distress  Pulses:  dorsalis pedis intact bilat.  Extremities:  no deformity.  no ulcer on the feet.  feet are of normal color and temp.  there is bilateral onychomycosis 1+ right pedal edema and 1+ left pedal edema.   Neurologic:  sensation is intact to touch on the feet.  Additional Exam:  Hemoglobin A1C       [H]  8.2 %    Impression & Recommendations:  Problem # 1:  DIABETES MELLITUS, TYPE I, WITH OPHTHALMIC COMPLICATIONS (123XX123) needs increased rx  Medications Added to Medication List This Visit: 1)  Humulin N 100 Unit/ml Susp (Insulin isophane human) .... 23 units at bedtime 2)  Novolog 100 Unit/ml Soln (Insulin aspart) .... Use tid (qac) 13-0-8 units 3)  Onetouch Test Strp (Glucose blood) .... Three times a day, and lancets 250.01.  variable glucoses  Other Orders: TLB-A1C / Hgb A1C (Glycohemoglobin) (83036-A1C) TLB-Microalbumin/Creat Ratio, Urine (82043-MALB) Est. Patient Level III [id number]) Prescription Created Electronically [phone])  Patient Instructions: 1)  blood tests are being ordered for you today.  please call [phone] to hear your test results. 2)  pending the test results, please: 3)  increase nph insulin to 23 units at bedtime 4)  reduce humalog to (just before each meal) 13-0-8 units 5)  Please  schedule a "medicare wellness" appointment in 3 months. 6)  (update: i left message on phone-tree:  rx as we discussed). Prescriptions: ONETOUCH TEST  STRP (GLUCOSE BLOOD) three times a day, and lancets 250.01.  variable glucoses  #100 x 11   Entered and Authorized by:   Donavan Foil MD   Signed by:   Donavan Foil MD on 04/12/2010   Method used:   Electronically to        [facility]  [address] [phone]* (retail)       [address]       [place], [place]  [address]       Ph: [phone] or [phone]       Fax: [phone]   RxID:   [id number]

## 2010-12-20 NOTE — Progress Notes (Signed)
Summary: Insulin increase?  Phone Note Call from Patient Call back at Home Phone [phone]   Caller: Patient Summary of Call: pt called stating that he heard message left on PT. Pt is requesting to have his Insulin slightly increased. CBGs have been increased per pt with not much fluctuation throughout the day. Range 140-185 x 1 week. Initial call taken by: Crissie Sickles, [place],  April 24, 2010 11:51 AM  Follow-up for Phone Call        increase nph to 25 units qhs Follow-up by: Donavan Foil MD,  April 24, 2010 11:58 AM  Additional Follow-up for Phone Call Additional follow up Details #1::        pt informed Additional Follow-up by: Crissie Sickles, CMA,  April 24, 2010 1:11 PM    New/Updated Medications: HUMULIN N 100 UNIT/ML  SUSP (INSULIN ISOPHANE HUMAN) 25 units at bedtime

## 2010-12-20 NOTE — Progress Notes (Signed)
Summary: cxr results  Phone Note Call from Patient   Caller: Patient Summary of Call: pt call requesting results back from CXR that was done 02/06/10. Pt states was not on PT Initial call taken by: Tomma Lightning,  February 12, 2010 11:06 AM  Follow-up for Phone Call        called pt back msg was left on PT. CXR was  normal Follow-up by: Tomma Lightning,  February 12, 2010 11:19 AM

## 2010-12-20 NOTE — Assessment & Plan Note (Signed)
Summary: WENT TO [facility] FOR PNEMONIA SAT/SAE'S PT /NWS   Vital Signs:  Patient profile:   77 year old male Height:      72 inches Weight:      216 pounds O2 Sat:      96 % on Room air Temp:     97.1 degrees F oral Pulse rate:   84 / minute Pulse rhythm:   regular Resp:     16 per minute BP sitting:   118 / 64  (left arm) Cuff size:   small  Vitals Entered By: [place] (November 27, 2009 9:07 AM)  O2 Flow:  Room air CC: follow-up visit//pneimonia Is Patient Diabetic? Yes Did you bring your meter with you today? No Pain Assessment Patient in pain? no        Primary Care Provider:  ellison  CC:  follow-up visit//pneimonia.  History of Present Illness: New to me he needs a follow-up on pneumonia, has been coughing  for 2 weeks and went to [facility] ER 2 days ago and Chest Xray showed a patchy density in the right middle lobe. He feels much better after 2 days of Avelox.  Preventive Screening-Counseling & Management  Alcohol-Tobacco     Alcohol drinks/day: 0     Smoking Status: quit  Current Medications (verified): 1)  Aspirin 325 Mg  Tbec (Aspirin) .... Take 1 By Mouth Qd 2)  Humulin N 100 Unit/ml  Susp (Insulin Isophane Human) .... 20 Units Qhs 3)  Hydrochlorothiazide 25 Mg  Tabs (Hydrochlorothiazide) .... Take 1 By Mouth Qd 4)  Pravastatin Sodium 40 Mg  Tabs (Pravastatin Sodium) .... Take 1 By Mouth Two Times A Day 5)  Nitrostat 0.4 Mg  Subl (Nitroglycerin) .... Take Prn 6)  Freestyle Lancets  Misc (Lancets) 7)  Onetouch Ultrasoft Lancets  Misc (Lancets) .... Use Asd Two Times A Day As Needed 8)  Norvasc 5 Mg  Tabs (Amlodipine Besylate) .... Qd 9)  Metoprolol Succinate 50 Mg Xr24h-Tab (Metoprolol Succinate) .... Take 1/2 By Mouth Qd 10)  Novolog 100 Unit/ml Soln (Insulin Aspart) .... Use Tid (Qac) 13-0-9 Units 11)  Cvs Insulin Syringe 30g X 1/2" 0.5 Ml Misc (Insulin Syringe-Needle U-100) .... Use Qid 12)  Furosemide 20 Mg Tabs (Furosemide) .Marland Kitchen.. 1 Qd 13)  Lisinopril  10 Mg Tabs (Lisinopril) .... (1/2) Daily 14)  Onetouch Test  Strp (Glucose Blood) .... Test Up To 4 Times A Day  Allergies (verified): No Known Drug Allergies  Past History:  Past Medical History: Reviewed history from 06/13/2007 and no changes required. Coronary artery disease Diabetes mellitus, type I Hyperlipidemia Hypertension DM Nepharopathy Increase potassium due to DM neparopathy DM Retinopathy Increase creatinine, no glucophage Prostate cancer Left neck skin cancer CVA x 2  Past Surgical History: Reviewed history from 06/13/2007 and no changes required. Cataract extraction Cholecystectomy Right Ing. hernia EDG (06/28/1991) Rest/Stress Cardiolite (01/22/2001) Carotid duplex (09/12/04) EKG (10/31/2006)  Social History: Reviewed history from 06/17/2009 and no changes required. Social History: non-smoker, non-drinker Retired  Review of Systems  The patient denies anorexia, fever, weight loss, weight gain, chest pain, syncope, dyspnea on exertion, peripheral edema, headaches, hemoptysis, abdominal pain, hematuria, enlarged lymph nodes, and angioedema.    Physical Exam  General:  alert, well-developed, well-nourished, well-hydrated, appropriate dress, normal appearance, healthy-appearing, cooperative to examination, and good hygiene.   Head:  normocephalic, atraumatic, no abnormalities observed, and no abnormalities palpated.   Eyes:  pink moist mm, no icterus Mouth:  Oral mucosa and oropharynx without lesions  or exudates.  Teeth in good repair. Neck:  supple, full ROM, no masses, no JVD, and no cervical lymphadenopathy.   Lungs:  normal respiratory effort, no intercostal retractions, no accessory muscle use, no dullness, no fremitus, no wheezes, and R base crackles.   Heart:  normal rate, regular rhythm, no murmur, no gallop, no rub, and no JVD.   Abdomen:  soft, non-tender, normal bowel sounds, no distention, no masses, no guarding, no rigidity, no hepatomegaly, and  no splenomegaly.   Msk:  normal ROM, no joint tenderness, no joint swelling, and no joint warmth.   Pulses:  dorsalis pedis intact bilat.  Extremities:  No clubbing, cyanosis, edema, or deformity noted with normal full range of motion of all joints.   Neurologic:  alert & oriented X3, cranial nerves II-XII intact, strength normal in all extremities, and gait normal.   Skin:  turgor normal, color normal, no rashes, no suspicious lesions, no ecchymoses, no petechiae, no purpura, no ulcerations, and no edema.   Cervical Nodes:  no anterior cervical adenopathy and no posterior cervical adenopathy.   Axillary Nodes:  no R axillary adenopathy and no L axillary adenopathy.   Psych:  normally interactive, good eye contact, not anxious appearing, not depressed appearing, easily distracted, poor concentration, and memory impairment.     Impression & Recommendations:  Problem # 1:  PNEUMONIA, ORGANISM UNSPECIFIED (ICD-486) Assessment Improved  His updated medication list for this problem includes:    Avelox 400 Mg Tabs (Moxifloxacin hcl) ..... One by mouth once daily for 10 days  nstructed patient to complete antibiotics, and call for worsened shortness of breath or new symptoms.   Problem # 2:  HYPERTENSION (ICD-401.9) Assessment: Improved  if cough doesn't improve then I advised him to stop the Lisinopril His updated medication list for this problem includes:    Hydrochlorothiazide 25 Mg Tabs (Hydrochlorothiazide) .Marland Kitchen... Take 1 by mouth qd    Norvasc 5 Mg Tabs (Amlodipine besylate) ..... Qd    Metoprolol Succinate 50 Mg Xr24h-tab (Metoprolol succinate) .Marland Kitchen... Take 1/2 by mouth qd    Furosemide 20 Mg Tabs (Furosemide) .Marland Kitchen... 1 qd    Lisinopril 10 Mg Tabs (Lisinopril) ..... (1/2) daily  BP today: 118/64 Prior BP: 108/44 (08/30/2009)  Labs Reviewed: K+: 5.0 (07/03/2009) Creat: : 1.7 (07/03/2009)   Chol: 125 (05/31/2009)   HDL: 29.00 (05/31/2009)   LDL: 78 (05/31/2009)   TG: 90.0  (05/31/2009)  Complete Medication List: 1)  Aspirin 325 Mg Tbec (Aspirin) .... Take 1 by mouth qd 2)  Humulin N 100 Unit/ml Susp (Insulin isophane human) .... 20 units qhs 3)  Hydrochlorothiazide 25 Mg Tabs (Hydrochlorothiazide) .... Take 1 by mouth qd 4)  Pravastatin Sodium 40 Mg Tabs (Pravastatin sodium) .... Take 1 by mouth two times a day 5)  Nitrostat 0.4 Mg Subl (Nitroglycerin) .... Take prn 6)  Freestyle Lancets Misc (Lancets) 7)  Onetouch Ultrasoft Lancets Misc (Lancets) .... Use asd two times a day as needed 8)  Norvasc 5 Mg Tabs (Amlodipine besylate) .... Qd 9)  Metoprolol Succinate 50 Mg Xr24h-tab (Metoprolol succinate) .... Take 1/2 by mouth qd 10)  Novolog 100 Unit/ml Soln (Insulin aspart) .... Use tid (qac) 13-0-9 units 11)  Cvs Insulin Syringe 30g X 1/2" 0.5 Ml Misc (Insulin syringe-needle u-100) .... Use qid 12)  Furosemide 20 Mg Tabs (Furosemide) .Marland Kitchen.. 1 qd 13)  Lisinopril 10 Mg Tabs (Lisinopril) .... (1/2) daily 14)  Onetouch Test Strp (Glucose blood) .... Test up to 4 times a day  15)  Avelox 400 Mg Tabs (Moxifloxacin hcl) .... One by mouth once daily for 10 days  Patient Instructions: 1)  Please schedule a follow-up appointment in 2 weeks. 2)  Check your Blood Pressure regularly. If it is above 130/80: you should make an appointment. 3)  Take your antibiotic as prescribed until ALL of it is gone, but stop if you develop a rash or swelling and contact our office as soon as possible. Prescriptions: AVELOX 400 MG TABS (MOXIFLOXACIN HCL) One by mouth once daily for 10 days  #10 x 0   Entered and Authorized by:   Janith Lima MD   Signed by:   Janith Lima MD on 11/27/2009   Method used:   Historical   [id number]

## 2010-12-21 NOTE — Miscellaneous (Signed)
Summary: [facility]   Imported By: Bubba Hales 02/15/2010 11:34:02  _____________________________________________________________________  External Attachment:    Type:   Image     Comment:   External Document

## 2011-01-16 ENCOUNTER — Encounter: Payer: Self-pay | Admitting: Endocrinology

## 2011-01-16 ENCOUNTER — Ambulatory Visit (INDEPENDENT_AMBULATORY_CARE_PROVIDER_SITE_OTHER): Payer: Medicare Other | Admitting: Endocrinology

## 2011-01-16 ENCOUNTER — Other Ambulatory Visit: Payer: Medicare Other

## 2011-01-16 ENCOUNTER — Other Ambulatory Visit: Payer: Self-pay | Admitting: Endocrinology

## 2011-01-16 DIAGNOSIS — E1039 Type 1 diabetes mellitus with other diabetic ophthalmic complication: Secondary | ICD-10-CM

## 2011-01-24 NOTE — Assessment & Plan Note (Signed)
Summary: 3 MO FU NWS #   Vital Signs:  Patient profile:   77 year old male Height:      72 inches (182.88 cm) Weight:      198.13 pounds (90.06 kg) BMI:     26.97 O2 Sat:      97 % on Room air Temp:     98.1 degrees F (36.72 degrees C) oral Pulse rate:   54 / minute Pulse rhythm:   regular BP sitting:   136 / 62  (left arm) Cuff size:   regular  Vitals Entered By: Rebeca Alert CMA Deborra Medina) (January 16, 2011 10:18 AM)  O2 Flow:  Room air CC: 3 month F/U/aj Is Patient Diabetic? Yes   Primary Provider:  Delontae Lamm  CC:  3 month F/U/aj.  History of Present Illness: no cbg record, but states cbg's are well-controlled, except it is in the 50's sometimes in the afternoon, with exertion.  it is highest in the morning.    Current Medications (verified): 1)  Aspirin 325 Mg  Tbec (Aspirin) .... Take 1 By Mouth Qd 2)  Humulin N 100 Unit/ml  Susp (Insulin Isophane Human) .... 25 Units At Bedtime 3)  Pravastatin Sodium 40 Mg  Tabs (Pravastatin Sodium) .... Take 1 By Mouth Two Times A Day 4)  Nitrostat 0.4 Mg  Subl (Nitroglycerin) .... Take Prn 5)  Onetouch Ultrasoft Lancets  Misc (Lancets) .... Use Asd Two Times A Day As Needed 6)  Novolog 100 Unit/ml Soln (Insulin Aspart) .... Use Tid (Qac) 13-0-6 Units 7)  Cvs Insulin Syringe 30g X 1/2" 0.5 Ml Misc (Insulin Syringe-Needle U-100) .... Use Qid 8)  Furosemide 20 Mg Tabs (Furosemide) .Marland Kitchen.. 1 Qd 9)  Onetouch Test  Strp (Glucose Blood) .... Three Times A Day, and Lancets 250.01.  Variable Glucoses 10)  Amlodipine Besylate 2.5 Mg Tabs (Amlodipine Besylate) .Marland Kitchen.. 1 Tab Once Daily 11)  Hydrochlorothiazide 12.5 Mg Tabs (Hydrochlorothiazide) .Marland Kitchen.. 1 Tab Once Daily 12)  Metoprolol Tartrate 25 Mg Tabs (Metoprolol Tartrate) .... 1/2 Tab Two Times A Day  Allergies (verified): No Known Drug Allergies  Past History:  Past Medical History: Last updated: 06/13/2007 Coronary artery disease Diabetes mellitus, type I Hyperlipidemia Hypertension DM  Nepharopathy Increase potassium due to DM neparopathy DM Retinopathy Increase creatinine, no glucophage Prostate cancer Left neck skin cancer CVA x 2  Review of Systems  The patient denies syncope.    Physical Exam  General:  obese.  no distress  Pulses:  dorsalis pedis intact bilat.  Extremities:  1+ right pedal edema and 1+ left pedal edema.   no deformity.  no ulcer on the feet.  feet are of normal color and temp.  the left great toenail has fallen off, and is regrowing.   Neurologic:  sensation is intact to touch on the feet.  Additional Exam:  Hemoglobin A1C       [H]  7.1 %     Impression & Recommendations:  Problem # 1:  DIABETES MELLITUS, TYPE I, WITH OPHTHALMIC COMPLICATIONS (123XX123) this is very good control, given his age and insulin requirement  Other Orders: TLB-A1C / Hgb A1C (Glycohemoglobin) (83036-A1C) Est. Patient Level III [id number])  Patient Instructions: 1)  blood tests are being ordered for you today.  please call [phone] to hear your test results. 2)  pending the test results, please continue the same medications for now. 3)  Please schedule a follow-up appointment in 3 months. 4)  (update: i left message on phone-tree:  rx as  we discussed)   Orders Added: 1)  TLB-A1C / Hgb A1C (Glycohemoglobin) [83036-A1C] 2)  Est. Patient Level III [id number]

## 2011-04-01 ENCOUNTER — Other Ambulatory Visit: Payer: Self-pay | Admitting: Endocrinology

## 2011-04-05 NOTE — Assessment & Plan Note (Signed)
[facility]                            CARDIOLOGY OFFICE NOTE   NAME:Chad Mcmillan, Chad Mcmillan                          MRN:          [id number]  DATE:10/31/2006                            DOB:          05-18-1924    IDENTIFICATION:  Chad Mcmillan is a 77 year old gentlemen with a history of  coronary artery disease, diabetes, dyslipidemia, and CV disease. I last  saw him back in May.   In the interval, he has been followed closely by Chad Mcmillan for  control of blood pressure, also diabetes, his sugars have been running a  little higher. His last few clinics, his blood pressure has always been  better in the 130-140 range.   Currently, the patient denies chest pain, no significant shortness of  breath.   CURRENT MEDICATIONS:  1. Aspirin 325 mg.  2. Toprol XL 25 daily.  3. Niaspan 1 gram daily.  4. Humalog insulin as directed.  5. Cardene SR 60 mg daily (new formulation).  6. Hydrochlorothiazide 25 mg daily.  7. Pravastatin 40 mg b.i.d.?   PHYSICAL EXAMINATION:  GENERAL: On exam, the patient is no distress.  VITAL SIGNS: Blood pressure on arrival 151/66, on my check 160/60, pulse  60, weight 212.  LUNGS: Clear.  CARDIAC: Regular rate and rhythm, S1, S2, no S3, no significant murmurs.  ABDOMEN: Benign.  EXTREMITIES: No edema.   IMPRESSION:  1. Hypertension. Not as good control now. He had been off the Cardene      for a week, because of the change in formulation. I told him to      continue to take it and call our clinic at the end of next week to      see how he is doing. Continue on this regimen for now.  2. Coronary artery disease. Last cardiac catheterization was in 2000,      50% LAD, 70% LAD, 90% diagonal, 80% circumflex, 40% RCA. Cardiolite      in 2002. No inducible ischemia. Clinically unchanged. Would      continue to follow.  3. Dyslipidemia. His HDL is better on the Niaspan. I would continue      with this. I have discussed with Chad Mcmillan,  and recommend      increasing his Humalog to get tighter sugar control.  4. Diabetes, as noted.   I will set to see the patient back in a few months, again he will call  with a blood pressure recording.   ADDENDUM   Cerebral vascular disease. Will review followup with Chad Mcmillan.     Chad Records, MD, [facility]  Electronically Signed    PVR/MedQ  DD: 10/31/2006  DT: 11/01/2006  Job #: [id number]   cc:   Hilliard Clark A. Loanne Drilling, MD

## 2011-04-05 NOTE — Assessment & Plan Note (Signed)
Chad Mcmillan                                 ON-CALL NOTE   NAME:CRUMEathen, Chad Mcmillan                            MRN:          [id number]  DATE:10/24/2007                            DOB:          01-Jan-1924    TIME OF CALL:  11:32am.   CALLER:  Patient.   PRIMARY CARE PHYSICIAN:  Dr. Loanne Drilling.   TELEPHONE NUMBER:  [phone]   The patient says that there is some sort of confusion about his  prescriptions at the pharmacy. The patient states that he feels fine and  has an adequate supply of his current medications. We advised him to  contact Dr. Loanne Drilling on Monday to get this straightened out.     Chad Mcmillan Holter. Sarajane Jews, MD  Electronically Signed    SAF/MedQ  DD: 10/24/2007  DT: 10/25/2007  Job #: [id number]

## 2011-04-16 ENCOUNTER — Other Ambulatory Visit (INDEPENDENT_AMBULATORY_CARE_PROVIDER_SITE_OTHER): Payer: Medicare Other

## 2011-04-16 ENCOUNTER — Encounter: Payer: Self-pay | Admitting: Endocrinology

## 2011-04-16 ENCOUNTER — Ambulatory Visit (INDEPENDENT_AMBULATORY_CARE_PROVIDER_SITE_OTHER): Payer: Medicare Other | Admitting: Endocrinology

## 2011-04-16 VITALS — BP 124/62 | HR 58 | Temp 98.5°F | Ht 72.0 in | Wt 197.8 lb

## 2011-04-16 DIAGNOSIS — E1039 Type 1 diabetes mellitus with other diabetic ophthalmic complication: Secondary | ICD-10-CM

## 2011-04-16 NOTE — Progress Notes (Signed)
  Subjective:    Patient ID: Chad Mcmillan, male    DOB: 24-Jun-1924, 77 y.o.   MRN: [id number]  HPI pt states he feels well in general.  no cbg record, but states cbg's are almost all i the low-100's.  He had 1 cbg of 55, after working in the garden.  He says cbg is higher at hs, than in am.   Past Medical History  Diagnosis Date  . CORONARY ARTERY DISEASE 06/13/2007  . DIABETES MELLITUS, TYPE I, WITH OPHTHALMIC COMPLICATIONS 123456  . HYPERLIPIDEMIA 06/13/2007  . HYPERTENSION 06/13/2007  . ADENOCARCINOMA, PROSTATE 06/01/2008  . NEOPLASM, MALIGNANT, SKIN, FACE 06/01/2008  . HYPERKALEMIA 06/01/2008    due to DM neparopathy  . CVA 06/01/2008    x 2  . Other chronic nonalcoholic liver disease A999333  . RENAL DISEASE 06/01/2008  . DM retinopathy     Past Surgical History  Procedure Date  . Cataract extraction   . Cholecystectomy   . Hernia repair     right Inguinal hernia  . Esophagogastroduodenoscopy 06/28/1991  . Rest/stress cardiolite 01/22/2001  . Carotid duplex 09/12/2004    History   Social History  . Marital Status: Married    Spouse Name: N/A    Number of Children: N/A  . Years of Education: N/A   Occupational History  . Retired    Social History Main Topics  . Smoking status: Former Smoker    Quit date: 11/18/1978  . Smokeless tobacco: Not on file  . Alcohol Use: No  . Drug Use: No  . Sexually Active: Not on file   Other Topics Concern  . Not on file   Social History Narrative  . No narrative on file    Current Outpatient Prescriptions on File Prior to Visit  Medication Sig Dispense Refill  . furosemide (LASIX) 20 MG tablet TAKE 1 TABLET EVERY DAY  30 tablet  4    No Known Allergies  No family history on file.  BP 124/62  Pulse 58  Temp(Src) 98.5 F (36.9 C) (Oral)  Ht 6' (1.829 m)  Wt 197 lb 12.8 oz (89.721 kg)  BMI 26.83 kg/m2  SpO2 96%    Review of Systems Denies loc.    Objective:   Physical Exam GENERAL: no  distress SKIN: Insulin injection sites at the anterior abdomen are normal.    Lab Results  Component Value Date   HGBA1C 7.2* 04/16/2011     Assessment & Plan:  Dm.  he needs only slight adjustments in his therapy

## 2011-04-16 NOTE — Patient Instructions (Addendum)
blood tests are being ordered for you today.  please call [phone] to hear your test results.  You will be prompted to enter the 9-digit "MRN" number that appears at the top left of this page, followed by #.  Then you will hear the message. pending the test results, please reduce nph insulin to 22 units at bedtime. Increase novolog to 3x a day (just before each meal) 13-0-10 units. your regular physical is due in September. (update: i left message on phone-tree:  rx as we discussed)

## 2011-05-02 ENCOUNTER — Other Ambulatory Visit: Payer: Self-pay | Admitting: *Deleted

## 2011-05-02 ENCOUNTER — Other Ambulatory Visit: Payer: Self-pay | Admitting: Endocrinology

## 2011-05-02 MED ORDER — PRAVASTATIN SODIUM 40 MG PO TABS: 40.0000 mg | ORAL_TABLET | Freq: Two times a day (BID) | ORAL | Status: DC

## 2011-05-02 NOTE — Telephone Encounter (Signed)
R'cd fax from [facility] for refill of pt's Pravastatin   Last OV-04/16/2011 Last filled-04/01/2011

## 2011-05-23 ENCOUNTER — Other Ambulatory Visit: Payer: Self-pay | Admitting: Endocrinology

## 2011-06-27 ENCOUNTER — Emergency Department (HOSPITAL_COMMUNITY): Payer: Medicare Other

## 2011-06-27 ENCOUNTER — Emergency Department (HOSPITAL_COMMUNITY)
Admission: EM | Admit: 2011-06-27 | Discharge: 2011-06-27 | Disposition: A | Discharge status: Home / Self Care | Payer: Medicare Other | Attending: Emergency Medicine | Admitting: Emergency Medicine

## 2011-06-27 DIAGNOSIS — W06XXXA Fall from bed, initial encounter: Secondary | ICD-10-CM | POA: Insufficient documentation

## 2011-06-27 DIAGNOSIS — I10 Essential (primary) hypertension: Secondary | ICD-10-CM | POA: Insufficient documentation

## 2011-06-27 DIAGNOSIS — N289 Disorder of kidney and ureter, unspecified: Secondary | ICD-10-CM | POA: Insufficient documentation

## 2011-06-27 DIAGNOSIS — I519 Heart disease, unspecified: Secondary | ICD-10-CM | POA: Insufficient documentation

## 2011-06-27 DIAGNOSIS — E7800 Pure hypercholesterolemia, unspecified: Secondary | ICD-10-CM | POA: Insufficient documentation

## 2011-06-27 DIAGNOSIS — Z7982 Long term (current) use of aspirin: Secondary | ICD-10-CM | POA: Insufficient documentation

## 2011-06-27 DIAGNOSIS — S42309A Unspecified fracture of shaft of humerus, unspecified arm, initial encounter for closed fracture: Secondary | ICD-10-CM | POA: Insufficient documentation

## 2011-06-27 DIAGNOSIS — M542 Cervicalgia: Secondary | ICD-10-CM | POA: Insufficient documentation

## 2011-06-27 DIAGNOSIS — E119 Type 2 diabetes mellitus without complications: Secondary | ICD-10-CM | POA: Insufficient documentation

## 2011-06-27 DIAGNOSIS — S0990XA Unspecified injury of head, initial encounter: Secondary | ICD-10-CM | POA: Insufficient documentation

## 2011-06-27 DIAGNOSIS — Y92009 Unspecified place in unspecified non-institutional (private) residence as the place of occurrence of the external cause: Secondary | ICD-10-CM | POA: Insufficient documentation

## 2011-06-27 DIAGNOSIS — Z8673 Personal history of transient ischemic attack (TIA), and cerebral infarction without residual deficits: Secondary | ICD-10-CM | POA: Insufficient documentation

## 2011-06-27 DIAGNOSIS — Y998 Other external cause status: Secondary | ICD-10-CM | POA: Insufficient documentation

## 2011-06-27 DIAGNOSIS — Z794 Long term (current) use of insulin: Secondary | ICD-10-CM | POA: Insufficient documentation

## 2011-06-27 DIAGNOSIS — Z79899 Other long term (current) drug therapy: Secondary | ICD-10-CM | POA: Insufficient documentation

## 2011-06-27 LAB — POCT I-STAT, CHEM 8
Glucose, Bld: 126 mg/dL — ABNORMAL HIGH (ref 70–99)
HCT: 38 % — ABNORMAL LOW (ref 39.0–52.0)
Hemoglobin: 12.9 g/dL — ABNORMAL LOW (ref 13.0–17.0)
Potassium: 4.4 mEq/L (ref 3.5–5.1)
Sodium: 141 mEq/L (ref 135–145)

## 2011-06-27 LAB — CBC
Hemoglobin: 12.3 g/dL — ABNORMAL LOW (ref 13.0–17.0)
MCHC: 33.2 g/dL (ref 30.0–36.0)
WBC: 4.9 10*3/uL (ref 4.0–10.5)

## 2011-06-27 LAB — GLUCOSE, CAPILLARY
Glucose-Capillary: 129 mg/dL — ABNORMAL HIGH (ref 70–99)
Glucose-Capillary: 125 mg/dL — ABNORMAL HIGH (ref 70–99)

## 2011-07-23 ENCOUNTER — Ambulatory Visit (INDEPENDENT_AMBULATORY_CARE_PROVIDER_SITE_OTHER): Payer: Medicare Other | Admitting: Endocrinology

## 2011-07-23 ENCOUNTER — Other Ambulatory Visit (INDEPENDENT_AMBULATORY_CARE_PROVIDER_SITE_OTHER): Payer: Medicare Other

## 2011-07-23 ENCOUNTER — Encounter: Payer: Self-pay | Admitting: Endocrinology

## 2011-07-23 DIAGNOSIS — Z79899 Other long term (current) drug therapy: Secondary | ICD-10-CM | POA: Insufficient documentation

## 2011-07-23 DIAGNOSIS — E1039 Type 1 diabetes mellitus with other diabetic ophthalmic complication: Secondary | ICD-10-CM

## 2011-07-23 DIAGNOSIS — N289 Disorder of kidney and ureter, unspecified: Secondary | ICD-10-CM

## 2011-07-23 DIAGNOSIS — Z0000 Encounter for general adult medical examination without abnormal findings: Secondary | ICD-10-CM

## 2011-07-23 DIAGNOSIS — E785 Hyperlipidemia, unspecified: Secondary | ICD-10-CM

## 2011-07-23 DIAGNOSIS — I10 Essential (primary) hypertension: Secondary | ICD-10-CM

## 2011-07-23 DIAGNOSIS — C61 Malignant neoplasm of prostate: Secondary | ICD-10-CM

## 2011-07-23 LAB — PSA: PSA: 0.55 ng/mL (ref 0.10–4.00)

## 2011-07-23 LAB — MICROALBUMIN / CREATININE URINE RATIO: Microalb Creat Ratio: 3.6 mg/g (ref 0.0–30.0)

## 2011-07-23 LAB — LIPID PANEL
Cholesterol: 120 mg/dL (ref 0–200)
LDL Cholesterol: 70 mg/dL (ref 0–99)
Triglycerides: 58 mg/dL (ref 0.0–149.0)

## 2011-07-23 LAB — HEPATIC FUNCTION PANEL
AST: 43 U/L — ABNORMAL HIGH (ref 0–37)
Total Bilirubin: 0.7 mg/dL (ref 0.3–1.2)

## 2011-07-23 LAB — HEMOGLOBIN A1C: Hgb A1c MFr Bld: 6.6 % — ABNORMAL HIGH (ref 4.6–6.5)

## 2011-07-23 NOTE — Patient Instructions (Addendum)
blood tests are being requested for you today.  please call [phone] to hear your test results.  You will be prompted to enter the 9-digit "MRN" number that appears at the top left of this page, followed by #.  Then you will hear the message. please consider these measures for your health:  minimize alcohol.  do not use tobacco products.  have a colonoscopy at least every 10 years from age 75.  Women should have an annual mammogram from age 28.  keep firearms safely stored.  always use seat belts.  have working smoke alarms in your home.  see an eye doctor and dentist regularly.  never drive under the influence of alcohol or drugs (including prescription drugs).  those with fair skin should take precautions against the sun. please let me know what your wishes would be, if artificial life support measures should become necessary.  it is critically important to prevent falling down (keep floor areas well-lit, dry, and free of loose objects) We'll recheck blood pressure when you come back in 3 months.   Reduce novolog to (just before each meal) 8-0-10 units. Continue nph, 22 units at bedtime.   good diet and exercise habits significanly improve the control of your diabetes.  please let me know if you wish to be referred to a dietician.  high blood sugar is very risky to your health.  you should see an eye doctor every year. controlling your blood pressure and cholesterol drastically reduces the damage diabetes does to your body.  this also applies to quitting smoking.  please discuss these with your doctor.  you should take an aspirin every day, unless you have been advised by a doctor not to. You should have a vaccine against shingles (a painful rash which results from the  chickenpox infection which most people had many years ago).  This vaccine reduces, but does not totally eliminate the risk of shingles.  Because this is a medicare part d benefit, there are 3 ways you can get it:  You can go to a pharmacy  and get the injection (I can give you a prescription), or I can give you a prescription to have filled at a pharmacy, and bring back here for [place] to give.  The other option is that you can pay up-front for it, and we'll give you a form to make a claim for reimbursement from your medicare part d carrier. (update: we discussed code status.  pt requests full code, but would not want to be started or maintained on artificial life-support measures if there was not a reasonable chance of recovery)

## 2011-07-23 NOTE — Progress Notes (Signed)
Subjective:    Patient ID: Chad Mcmillan, male    DOB: 10/23/1924, 77 y.o.   MRN: [id number]  HPI Subjective:   Patient here for Medicare annual wellness visit and management of other chronic and acute problems.  he says he has had pneumovax at the hospital in 2010.     Risk factors: advanced age    88 of Physicians Providing Medical Care to Patient: Ortho: norris Cardiol: ross Opthal: matthews   Activities of Daily Living: In your present state of health, do you have any difficulty performing the following activities?:  Preparing food and eating?: No  Bathing yourself: No  Getting dressed: No  Using the toilet:No  Moving around from place to place: No  In the past year have you fallen or had a near fall?: None, except a fall 3 weeks ago--fx left humerus Home Safety: Has smoke detector and wears seat belts.  Firearms are safely stored. No excess sun exposure.  Diet and Exercise  Current exercise habits:  Pt says good Dietary issues discussed: pt reports a healthy diet   Depression Screen  Q1: Over the past two weeks, have you felt down, depressed or hopeless? no  Q2: Over the past two weeks, have you felt little interest or pleasure in doing things? no   The following portions of the patient's history were reviewed and updated as appropriate: allergies, current medications, past family history, past medical history, past social history, past surgical history and problem list.  Past Medical History  Diagnosis Date  . CORONARY ARTERY DISEASE 06/13/2007  . DIABETES MELLITUS, TYPE I, WITH OPHTHALMIC COMPLICATIONS 123456  . HYPERLIPIDEMIA 06/13/2007  . HYPERTENSION 06/13/2007  . ADENOCARCINOMA, PROSTATE 06/01/2008  . NEOPLASM, MALIGNANT, SKIN, FACE 06/01/2008  . HYPERKALEMIA 06/01/2008    due to DM neparopathy  . CVA 06/01/2008    x 2  . Other chronic nonalcoholic liver disease A999333  . RENAL DISEASE 06/01/2008  . DM retinopathy     Past Surgical History  Procedure  Date  . Cataract extraction   . Cholecystectomy   . Hernia repair     right Inguinal hernia  . Esophagogastroduodenoscopy 06/28/1991  . Rest/stress cardiolite 01/22/2001  . Carotid duplex 09/12/2004    History   Social History  . Marital Status: Married    Spouse Name: N/A    Number of Children: N/A  . Years of Education: N/A   Occupational History  . Retired    Social History Main Topics  . Smoking status: Former Smoker    Quit date: 11/18/1978  . Smokeless tobacco: Not on file  . Alcohol Use: No  . Drug Use: No  . Sexually Active: Not on file   Other Topics Concern  . Not on file   Social History Narrative  . No narrative on file    Current Outpatient Prescriptions on File Prior to Visit  Medication Sig Dispense Refill  . amLODipine (NORVASC) 2.5 MG tablet Take 2.5 mg by mouth daily.        Marland Kitchen aspirin 325 MG tablet Take 325 mg by mouth daily.        . furosemide (LASIX) 20 MG tablet TAKE 1 TABLET EVERY DAY  30 tablet  4  . glucose blood (ONE TOUCH TEST STRIPS) test strip Test three times a day dx 250.01. Variable glucoses  100 each  5  . hydrochlorothiazide (HYDRODIURIL) 12.5 MG tablet Take 12.5 mg by mouth daily.        . insulin aspart (  NOVOLOG) 100 UNIT/ML injection Inject into the skin 3 (three) times daily before meals. 8-0-10 units      . insulin NPH (HUMULIN N) 100 UNIT/ML injection Inject 22 Units into the skin at bedtime.  10 mL  4  . Insulin Syringe-Needle U-100 ([employer] INSULIN SYRINGE .5CC/30G) 30G X 1/2" 0.5 ML MISC Use four times a day       . Lancets (ONETOUCH ULTRASOFT) lancets Test three times a day dx 250.01  100 each  5  . metoprolol tartrate (LOPRESSOR) 25 MG tablet 1/2 tablet two times a day       . nitroGLYCERIN (NITROSTAT) 0.4 MG SL tablet Take as needed       . pravastatin (PRAVACHOL) 40 MG tablet Take 1 tablet (40 mg total) by mouth 2 (two) times daily.  60 tablet  5    No Known Allergies  No family history on file.  BP 140/62  Pulse 63   Temp(Src) 97.9 F (36.6 C) (Oral)  Ht 6' (1.829 m)  Wt 199 lb 12.8 oz (90.629 kg)  BMI 27.10 kg/m2  SpO2 99%   Review of Systems  Denies hearing loss, and visual loss Objective:   Vision:  Sees opthalmologist Hearing: grossly normal Body mass index:  See vs page Msk: pt easily and quickly performs "get-up-and-go" from a sitting position Cognitive Impairment Assessment: cognition, memory and judgment appear normal.  remembers 3/3 at 5 minutes.  excellent recall.  can easily read and write a sentence.  alert and oriented x 3   Assessment:   Medicare wellness utd on preventive parameters    Plan:   During the course of the visit the patient was educated and counseled about appropriate screening and preventive services including:        Fall prevention   Screening mammography  Diabetes screening  Nutrition counseling   Vaccines / LABS Zostavax / Pnemonccoal Vaccine  today  PSA  Patient Instructions (the written plan) was given to the patient.         Review of Systems     Objective:   Physical Exam        Assessment & Plan:      SEPARATE EVALUATION FOLLOWS-- HISTORY OF THE PRESENT ILLNESS: The state of at least three ongoing medical problems is addressed today: Dm: he brings a record of his cbg's which i have reviewed today.  He has hypoglycemia before lunch, approx 1/wk.  It is otherwise in the low to mid-100's.  There is otherwise no trend throughout the day. Dyslipidemia: pt takes pravachol as rx'ed, and tolerates well. Htn: he takes bp meds as rx'ed, and tolerates well.  Denies chest pain PAST MEDICAL HISTORY reviewed and up to date today REVIEW OF SYSTEMS: Denies loc, sob, weight change PHYSICAL EXAMINATION: VS: see vital signs page Pulses: dorsalis pedis intact bilat.   Feet: no deformity.  no ulcer on the feet.  feet are of normal color and temp.  no edema.  onychomycosis of toenails Neuro: sensation is intact to touch on the feet LAB/XRAY  RESULTS: Lab Results  Component Value Date   CHOL 120 07/23/2011   CHOL 124 07/17/2010   CHOL 125 05/31/2009   Lab Results  Component Value Date   HDL 38.90* 07/23/2011   HDL 37.50* 07/17/2010   HDL 29.00* 05/31/2009   Lab Results  Component Value Date   LDLCALC 70 07/23/2011   LDLCALC 68 07/17/2010   LDLCALC 78 05/31/2009   Lab Results  Component Value Date  TRIG 58.0 07/23/2011   TRIG 93.0 07/17/2010   TRIG 90.0 05/31/2009   Lab Results  Component Value Date   CHOLHDL 3 07/23/2011   CHOLHDL 3 07/17/2010   CHOLHDL 4 05/31/2009   No results found for this basename: LDLDIRECT   Lab Results  Component Value Date   HGBA1C 6.6* 07/23/2011  IMPRESSION: Dm, well-controlled Dyslipidemia, well-controlled Htn, well-controlled PLAN: See instruction page

## 2011-07-24 LAB — PTH, INTACT AND CALCIUM: PTH: 68.2 pg/mL (ref 14.0–72.0)

## 2011-08-05 ENCOUNTER — Other Ambulatory Visit: Payer: Self-pay | Admitting: Endocrinology

## 2011-08-28 ENCOUNTER — Ambulatory Visit (INDEPENDENT_AMBULATORY_CARE_PROVIDER_SITE_OTHER): Payer: Medicare Other | Admitting: *Deleted

## 2011-08-28 DIAGNOSIS — Z23 Encounter for immunization: Secondary | ICD-10-CM

## 2011-09-03 ENCOUNTER — Other Ambulatory Visit: Payer: Self-pay | Admitting: Endocrinology

## 2011-10-02 ENCOUNTER — Other Ambulatory Visit: Payer: Self-pay | Admitting: Endocrinology

## 2011-10-07 ENCOUNTER — Other Ambulatory Visit: Payer: Self-pay | Admitting: *Deleted

## 2011-10-07 ENCOUNTER — Encounter (INDEPENDENT_AMBULATORY_CARE_PROVIDER_SITE_OTHER): Payer: Medicare Other | Admitting: Ophthalmology

## 2011-10-07 DIAGNOSIS — E11319 Type 2 diabetes mellitus with unspecified diabetic retinopathy without macular edema: Secondary | ICD-10-CM

## 2011-10-07 DIAGNOSIS — H43819 Vitreous degeneration, unspecified eye: Secondary | ICD-10-CM

## 2011-10-07 DIAGNOSIS — E1139 Type 2 diabetes mellitus with other diabetic ophthalmic complication: Secondary | ICD-10-CM

## 2011-10-07 DIAGNOSIS — H3530 Unspecified macular degeneration: Secondary | ICD-10-CM

## 2011-10-07 MED ORDER — INSULIN NPH (HUMAN) (ISOPHANE) 100 UNIT/ML ~~LOC~~ SUSP: 22.0000 [IU] | Freq: Every day | SUBCUTANEOUS | Status: DC

## 2011-10-07 NOTE — Telephone Encounter (Signed)
Called pt, line busy

## 2011-10-07 NOTE — Telephone Encounter (Signed)
Pt needs rx for Humulin N changed to Novolin N and sent to [facility] on [address]-he is completely out-okay to change?

## 2011-10-07 NOTE — Telephone Encounter (Signed)
Ok to [employer] dosage

## 2011-10-07 NOTE — Telephone Encounter (Signed)
Called pt, line unavailable (number busy).

## 2011-10-08 NOTE — Telephone Encounter (Signed)
Pt informed

## 2011-11-02 ENCOUNTER — Other Ambulatory Visit: Payer: Self-pay | Admitting: Endocrinology

## 2011-12-24 ENCOUNTER — Encounter: Payer: Self-pay | Admitting: Endocrinology

## 2011-12-24 ENCOUNTER — Other Ambulatory Visit (INDEPENDENT_AMBULATORY_CARE_PROVIDER_SITE_OTHER): Payer: Medicare Other

## 2011-12-24 ENCOUNTER — Ambulatory Visit (INDEPENDENT_AMBULATORY_CARE_PROVIDER_SITE_OTHER): Payer: Medicare Other | Admitting: Endocrinology

## 2011-12-24 VITALS — BP 178/90 | HR 80 | Temp 97.3°F | Resp 16 | Wt 206.0 lb

## 2011-12-24 DIAGNOSIS — I10 Essential (primary) hypertension: Secondary | ICD-10-CM

## 2011-12-24 DIAGNOSIS — E1039 Type 1 diabetes mellitus with other diabetic ophthalmic complication: Secondary | ICD-10-CM

## 2011-12-24 DIAGNOSIS — H579 Unspecified disorder of eye and adnexa: Secondary | ICD-10-CM

## 2011-12-24 DIAGNOSIS — R609 Edema, unspecified: Secondary | ICD-10-CM

## 2011-12-24 LAB — HEMOGLOBIN A1C: Hgb A1c MFr Bld: 7.4 % — ABNORMAL HIGH (ref 4.6–6.5)

## 2011-12-24 MED ORDER — ISOSORBIDE MONONITRATE ER 30 MG PO TB24: 30.0000 mg | ORAL_TABLET | Freq: Every day | ORAL | Status: DC

## 2011-12-24 NOTE — Progress Notes (Signed)
Subjective:    Patient ID: Chad Mcmillan, male    DOB: 09-02-1924, 78 y.o.   MRN: [id number]  HPI Pt returns for f/u of insulin-requiring DM (1989). he brings a record of his cbg's which i have reviewed today.  It varies from 111-160.  There is no trend throughout the day. Pt states few years of slight swelling of the legs, but no assoc sob.  Past Medical History  Diagnosis Date  . CORONARY ARTERY DISEASE 06/13/2007  . DIABETES MELLITUS, TYPE I, WITH OPHTHALMIC COMPLICATIONS 123456  . HYPERLIPIDEMIA 06/13/2007  . HYPERTENSION 06/13/2007  . ADENOCARCINOMA, PROSTATE 06/01/2008  . NEOPLASM, MALIGNANT, SKIN, FACE 06/01/2008  . HYPERKALEMIA 06/01/2008    due to DM neparopathy  . CVA 06/01/2008    x 2  . Other chronic nonalcoholic liver disease [id number]  . RENAL DISEASE 06/01/2008  . DM retinopathy     Past Surgical History  Procedure Date  . Cataract extraction   . Cholecystectomy   . Hernia repair     right Inguinal hernia  . Esophagogastroduodenoscopy 06/28/1991  . Rest/stress cardiolite 01/22/2001  . Carotid duplex 09/12/2004    History   Social History  . Marital Status: Married    Spouse Name: N/A    Number of Children: N/A  . Years of Education: N/A   Occupational History  . Retired    Social History Main Topics  . Smoking status: Former Smoker    Quit date: 11/18/1978  . Smokeless tobacco: Not on file  . Alcohol Use: No  . Drug Use: No  . Sexually Active: Not on file   Other Topics Concern  . Not on file   Social History Narrative  . No narrative on file    Current Outpatient Prescriptions on File Prior to Visit  Medication Sig Dispense Refill  . amLODipine (NORVASC) 2.5 MG tablet TAKE 1 TABLET BY MOUTH EVERY DAY  30 tablet  11  . aspirin 325 MG tablet Take 325 mg by mouth daily.        . B-D INS SYR ULTRAFINE .3CC/30G 30G X 1/2" 0.3 ML MISC USE AS DIRECTED FOUR TIMES DAILY  200 each  5  . furosemide (LASIX) 20 MG tablet TAKE 1 TABLET EVERY DAY  30 tablet   10  . glucose blood (ONE TOUCH TEST STRIPS) test strip Test three times a day dx 250.01. Variable glucoses  100 each  5  . hydrochlorothiazide (HYDRODIURIL) 12.5 MG tablet Take 12.5 mg by mouth daily.        . hydrochlorothiazide (MICROZIDE) 12.5 MG capsule TAKE ONE CAPSULE BY MOUTH EVERY DAY  90 capsule  3  . HYDROcodone-acetaminophen (NORCO) 5-325 MG per tablet 1 tablet every 4 to 6 hours as needed       . ibuprofen (ADVIL,MOTRIN) 200 MG tablet 1-2 tablets every 4 to 6 hours as needed       . insulin aspart (NOVOLOG) 100 UNIT/ML injection Inject 3 times a day before meals 8-0-10 units  10 mL  5  . insulin NPH (NOVOLIN N) 100 UNIT/ML injection Inject 22 Units into the skin at bedtime.  10 mL  4  . Insulin Syringe-Needle U-100 ([employer] INSULIN SYRINGE .5CC/30G) 30G X 1/2" 0.5 ML MISC Use four times a day       . Lancets (ONETOUCH ULTRASOFT) lancets Test three times a day dx 250.01  100 each  5  . metoprolol tartrate (LOPRESSOR) 25 MG tablet 1/2 TAB TWO TIMES A DAY  30  tablet  8  . nitroGLYCERIN (NITROSTAT) 0.4 MG SL tablet Take as needed       . pravastatin (PRAVACHOL) 40 MG tablet TAKE ONE TABLET BY MOUTH TWICE DAILY  60 tablet  5    No Known Allergies  No family history on file.  BP 178/90  Pulse 80  Temp(Src) 97.3 F (36.3 C) (Oral)  Resp 16  Wt 206 lb (93.441 kg)   Review of Systems denies hypoglycemia and weight change    Objective:   Physical Exam VITAL SIGNS:  See vs page GENERAL: no distress Pulses: dorsalis pedis intact bilat.   Feet: no deformity.  no ulcer on the feet.  feet are of normal color and temp.  1+ bilat leg edema Neuro: sensation is intact to touch on the feet  Lab Results  Component Value Date   HGBA1C 7.4* 12/24/2011      Assessment & Plan:  HTN: Low-normal pulse noted at last ov is relative contraindication to increasing b-blocker Renal insuff is relative contraindication to acei or arb Edema is relative contraindication to increasing norvasc. DM.   We can't increase insulin due to variable cbg's

## 2011-12-24 NOTE — Patient Instructions (Addendum)
continue novolog (just before each meal) 8-0-10 units. Continue nph, 22 units at bedtime.   blood tests are being requested for you today.  please call [phone] to hear your test results.  You will be prompted to enter the 9-digit "MRN" number that appears at the top left of this page, followed by #.  Then you will hear the message. check your blood sugar 2 times a day.  vary the time of day when you check, between before the 3 meals, and at bedtime.  also check if you have symptoms of your blood sugar being too high or too low.  please keep a record of the readings and bring it to your next appointment here.  please call us sooner if your blood sugar goes below 70, or if it stays over 200.  Add isosorbide, 30 mg daily.   Please come back for a follow-up appointment for 1 month.  Please make an appointment.  (update: i left message on phone-tree:  rx as we discussed).

## 2012-01-21 ENCOUNTER — Ambulatory Visit: Payer: Medicare Other | Admitting: Endocrinology

## 2012-02-04 ENCOUNTER — Encounter: Payer: Self-pay | Admitting: Endocrinology

## 2012-02-04 ENCOUNTER — Ambulatory Visit (INDEPENDENT_AMBULATORY_CARE_PROVIDER_SITE_OTHER): Payer: Medicare Other | Admitting: Endocrinology

## 2012-02-04 VITALS — BP 144/78 | HR 52 | Temp 97.0°F | Ht 72.0 in | Wt 210.0 lb

## 2012-02-04 DIAGNOSIS — I10 Essential (primary) hypertension: Secondary | ICD-10-CM

## 2012-02-04 MED ORDER — HYDRALAZINE HCL 10 MG PO TABS: 10.0000 mg | ORAL_TABLET | Freq: Three times a day (TID) | ORAL | Status: DC

## 2012-02-04 NOTE — Progress Notes (Signed)
Subjective:    Patient ID: Chad Mcmillan, male    DOB: 1924/07/01, 78 y.o.   MRN: [id number]  HPI Pt returns for f/u of HTN.  rx has been limited by edema, renal insuff, and bradycardia.  He takes meds as rx'ed.   Past Medical History  Diagnosis Date  . CORONARY ARTERY DISEASE 06/13/2007  . DIABETES MELLITUS, TYPE I, WITH OPHTHALMIC COMPLICATIONS 03/10/5360  . HYPERLIPIDEMIA 06/13/2007  . HYPERTENSION 06/13/2007  . ADENOCARCINOMA, PROSTATE 06/01/2008  . NEOPLASM, MALIGNANT, SKIN, FACE 06/01/2008  . HYPERKALEMIA 06/01/2008    due to DM neparopathy  . CVA 06/01/2008    x 2  . Other chronic nonalcoholic liver disease 4/43/1540  . RENAL DISEASE 06/01/2008  . DM retinopathy     Past Surgical History  Procedure Date  . Cataract extraction   . Cholecystectomy   . Hernia repair     right Inguinal hernia  . Esophagogastroduodenoscopy 06/28/1991  . Rest/stress cardiolite 01/22/2001  . Carotid duplex 09/12/2004    History   Social History  . Marital Status: Married    Spouse Name: N/A    Number of Children: N/A  . Years of Education: N/A   Occupational History  . Retired    Social History Main Topics  . Smoking status: Former Smoker    Quit date: 11/18/1978  . Smokeless tobacco: Not on file  . Alcohol Use: No  . Drug Use: No  . Sexually Active: Not on file   Other Topics Concern  . Not on file   Social History Narrative  . No narrative on file    Current Outpatient Prescriptions on File Prior to Visit  Medication Sig Dispense Refill  . amLODipine (NORVASC) 2.5 MG tablet TAKE 1 TABLET BY MOUTH EVERY DAY  30 tablet  11  . aspirin 325 MG tablet Take 325 mg by mouth daily.        . B-D INS SYR ULTRAFINE .3CC/30G 30G X 1/2" 0.3 ML MISC USE AS DIRECTED FOUR TIMES DAILY  200 each  5  . furosemide (LASIX) 20 MG tablet TAKE 1 TABLET EVERY DAY  30 tablet  10  . glucose blood (ONE TOUCH TEST STRIPS) test strip Test three times a day dx 250.01. Variable glucoses  100 each  5  .  hydrochlorothiazide (MICROZIDE) 12.5 MG capsule TAKE ONE CAPSULE BY MOUTH EVERY DAY  90 capsule  3  . HYDROcodone-acetaminophen (NORCO) 5-325 MG per tablet 1 tablet every 4 to 6 hours as needed       . ibuprofen (ADVIL,MOTRIN) 200 MG tablet 1-2 tablets every 4 to 6 hours as needed       . insulin aspart (NOVOLOG) 100 UNIT/ML injection Inject 3 times a day before meals 8-0-10 units  10 mL  5  . insulin NPH (NOVOLIN N) 100 UNIT/ML injection Inject 22 Units into the skin at bedtime.  10 mL  4  . isosorbide mononitrate (IMDUR) 30 MG 24 hr tablet Take 1 tablet (30 mg total) by mouth daily.  30 tablet  11  . Lancets (ONETOUCH ULTRASOFT) lancets Test three times a day dx 250.01  100 each  5  . metoprolol tartrate (LOPRESSOR) 25 MG tablet 1/2 TAB TWO TIMES A DAY  30 tablet  8  . nitroGLYCERIN (NITROSTAT) 0.4 MG SL tablet Take as needed       . pravastatin (PRAVACHOL) 40 MG tablet TAKE ONE TABLET BY MOUTH TWICE DAILY  60 tablet  5    No Known  Allergies  No family history on file.  BP 144/78  Pulse 52  Temp(Src) 97 F (36.1 C) (Oral)  Ht 6' (1.829 m)  Wt 210 lb (95.255 kg)  BMI 28.48 kg/m2  SpO2 98%  Review of Systems Denies sob    Objective:   Physical Exam VITAL SIGNS:  See vs page GENERAL: no distress Ext: 1+ bilat leg edema.         Assessment & Plan:  HTN, needs increased rx

## 2012-02-04 NOTE — Patient Instructions (Addendum)
Add hydralazine 10 mg 3x a day (just before each meal).   Please come back for a follow-up appointment for 2 months.

## 2012-04-06 ENCOUNTER — Ambulatory Visit (INDEPENDENT_AMBULATORY_CARE_PROVIDER_SITE_OTHER): Payer: Medicare Other | Admitting: Endocrinology

## 2012-04-06 ENCOUNTER — Encounter: Payer: Self-pay | Admitting: Endocrinology

## 2012-04-06 ENCOUNTER — Other Ambulatory Visit (INDEPENDENT_AMBULATORY_CARE_PROVIDER_SITE_OTHER): Payer: Medicare Other

## 2012-04-06 VITALS — BP 142/62 | HR 60 | Temp 98.0°F | Ht 72.0 in | Wt 210.0 lb

## 2012-04-06 DIAGNOSIS — H579 Unspecified disorder of eye and adnexa: Secondary | ICD-10-CM

## 2012-04-06 DIAGNOSIS — E1039 Type 1 diabetes mellitus with other diabetic ophthalmic complication: Secondary | ICD-10-CM

## 2012-04-06 LAB — HEMOGLOBIN A1C: Hgb A1c MFr Bld: 7.5 % — ABNORMAL HIGH (ref 4.6–6.5)

## 2012-04-06 NOTE — Patient Instructions (Addendum)
check your blood sugar 2 times a day.  vary the time of day when you check, between before the 3 meals, and at bedtime.  also check if you have symptoms of your blood sugar being too high or too low.  please keep a record of the readings and bring it to your next appointment here.  please call us sooner if your blood sugar goes below 70, or if it stays over 200. Please come back for a "medicare wellness"  appointment after 07/22/12.  continue novolog (just before meals) 8-0-10 units.  reduce nph to 20 units at bedtime.

## 2012-04-06 NOTE — Progress Notes (Signed)
Subjective:    Patient ID: Chad Mcmillan, male    DOB: October 09, 1924, 78 y.o.   MRN: [id number]  HPI Pt returns for f/u of insulin-requiring DM (dx'ed 8416, complicated by renal insuff, CAD, retinopathy, and CVA). he brings a record of his cbg's which i have reviewed today.  It is well-controlled.  it is highest in the afternoon, and lowest in am. Past Medical History  Diagnosis Date  . CORONARY ARTERY DISEASE 06/13/2007  . DIABETES MELLITUS, TYPE I, WITH OPHTHALMIC COMPLICATIONS 04/24/3015  . HYPERLIPIDEMIA 06/13/2007  . HYPERTENSION 06/13/2007  . ADENOCARCINOMA, PROSTATE 06/01/2008  . NEOPLASM, MALIGNANT, SKIN, FACE 06/01/2008  . HYPERKALEMIA 06/01/2008    due to DM neparopathy  . CVA 06/01/2008    x 2  . Other chronic nonalcoholic liver disease 0/08/9322  . RENAL DISEASE 06/01/2008  . DM retinopathy     Past Surgical History  Procedure Date  . Cataract extraction   . Cholecystectomy   . Hernia repair     right Inguinal hernia  . Esophagogastroduodenoscopy 06/28/1991  . Rest/stress cardiolite 01/22/2001  . Carotid duplex 09/12/2004    History   Social History  . Marital Status: Married    Spouse Name: N/A    Number of Children: N/A  . Years of Education: N/A   Occupational History  . Retired    Social History Main Topics  . Smoking status: Former Smoker    Quit date: 11/18/1978  . Smokeless tobacco: Not on file  . Alcohol Use: No  . Drug Use: No  . Sexually Active: Not on file   Other Topics Concern  . Not on file   Social History Narrative  . No narrative on file    Current Outpatient Prescriptions on File Prior to Visit  Medication Sig Dispense Refill  . amLODipine (NORVASC) 2.5 MG tablet TAKE 1 TABLET BY MOUTH EVERY DAY  30 tablet  11  . aspirin 325 MG tablet Take 325 mg by mouth daily.        . B-D INS SYR ULTRAFINE .3CC/30G 30G X 1/2" 0.3 ML MISC USE AS DIRECTED FOUR TIMES DAILY  200 each  5  . furosemide (LASIX) 20 MG tablet TAKE 1 TABLET EVERY DAY  30  tablet  10  . glucose blood (ONE TOUCH TEST STRIPS) test strip Test three times a day dx 250.01. Variable glucoses  100 each  5  . hydrALAZINE (APRESOLINE) 10 MG tablet Take 1 tablet (10 mg total) by mouth 3 (three) times daily.  90 tablet  11  . hydrochlorothiazide (MICROZIDE) 12.5 MG capsule TAKE ONE CAPSULE BY MOUTH EVERY DAY  90 capsule  3  . HYDROcodone-acetaminophen (NORCO) 5-325 MG per tablet 1 tablet every 4 to 6 hours as needed       . ibuprofen (ADVIL,MOTRIN) 200 MG tablet 1-2 tablets every 4 to 6 hours as needed       . insulin aspart (NOVOLOG) 100 UNIT/ML injection Inject 3 times a day before meals 8-0-10 units  10 mL  5  . insulin NPH (HUMULIN N,NOVOLIN N) 100 UNIT/ML injection Inject 20 Units into the skin at bedtime.      . isosorbide mononitrate (IMDUR) 30 MG 24 hr tablet Take 1 tablet (30 mg total) by mouth daily.  30 tablet  11  . Lancets (ONETOUCH ULTRASOFT) lancets Test three times a day dx 250.01  100 each  5  . metoprolol tartrate (LOPRESSOR) 25 MG tablet 1/2 TAB TWO TIMES A DAY  30  tablet  8  . nitroGLYCERIN (NITROSTAT) 0.4 MG SL tablet Take as needed       . pravastatin (PRAVACHOL) 40 MG tablet TAKE ONE TABLET BY MOUTH TWICE DAILY  60 tablet  5    No Known Allergies  No family history on file.  BP 142/62  Pulse 60  Temp(Src) 98 F (36.7 C) (Oral)  Ht 6' (1.829 m)  Wt 210 lb (95.255 kg)  BMI 28.48 kg/m2  SpO2 97%  Review of Systems denies hypoglycemia    Objective:   Physical Exam VITAL SIGNS:  See vs page GENERAL: no distress PSYCH: Alert and oriented x 3.  Does not appear anxious nor depressed.  Lab Results  Component Value Date   HGBA1C 7.5* 04/06/2012      Assessment & Plan:  DM.  The pattern of his cbg's indicates he needs some adjustment in his therapy

## 2012-04-07 ENCOUNTER — Telehealth: Payer: Self-pay | Admitting: *Deleted

## 2012-04-07 NOTE — Telephone Encounter (Signed)
Called pt to inform of lab results, pt informed (letter also mailed to pt). 

## 2012-04-30 ENCOUNTER — Other Ambulatory Visit: Payer: Self-pay | Admitting: Endocrinology

## 2012-05-18 ENCOUNTER — Ambulatory Visit (INDEPENDENT_AMBULATORY_CARE_PROVIDER_SITE_OTHER): Payer: Medicare Other | Admitting: Internal Medicine

## 2012-05-18 ENCOUNTER — Encounter: Payer: Self-pay | Admitting: Internal Medicine

## 2012-05-18 VITALS — BP 148/76 | HR 59 | Ht 69.0 in | Wt 208.0 lb

## 2012-05-18 DIAGNOSIS — I2510 Atherosclerotic heart disease of native coronary artery without angina pectoris: Secondary | ICD-10-CM

## 2012-05-18 NOTE — Progress Notes (Addendum)
HPI Patient is an 78 year old with a history of remote Cath.  Last cath in 2000.  (LAD 50%, 70%, Diag 90% LCX 70 to 80%.  LVEF 70.  I saw him  Last in January 2012.  He had a myoview in Fall 2010. Lpid panel in September 2012 LDL was 70, HDL 39  Since seen breathing OK  No CP.  NOt walking lately due to flu.   Doing a little.  No DOE. Glu  Running good .  Not dizzy.  No Known Allergies  Current Outpatient Prescriptions  Medication Sig Dispense Refill  . amLODipine (NORVASC) 2.5 MG tablet TAKE 1 TABLET BY MOUTH EVERY DAY  30 tablet  11  . aspirin 325 MG tablet Take 325 mg by mouth daily.        . B-D INS SYR ULTRAFINE .3CC/30G 30G X 1/2" 0.3 ML MISC USE AS DIRECTED FOUR TIMES DAILY  200 each  5  . furosemide (LASIX) 20 MG tablet TAKE 1 TABLET EVERY DAY  30 tablet  10  . glucose blood (ONE TOUCH TEST STRIPS) test strip Test three times a day dx 250.01. Variable glucoses  100 each  5  . hydrALAZINE (APRESOLINE) 10 MG tablet Take 1 tablet (10 mg total) by mouth 3 (three) times daily.  90 tablet  11  . hydrochlorothiazide (MICROZIDE) 12.5 MG capsule TAKE ONE CAPSULE BY MOUTH EVERY DAY  90 capsule  2  . HYDROcodone-acetaminophen (NORCO) 5-325 MG per tablet 1 tablet every 4 to 6 hours as needed       . ibuprofen (ADVIL,MOTRIN) 200 MG tablet 1-2 tablets every 4 to 6 hours as needed       . insulin aspart (NOVOLOG) 100 UNIT/ML injection Inject 3 times a day before meals 8-0-10 units  10 mL  5  . insulin NPH (HUMULIN N,NOVOLIN N) 100 UNIT/ML injection Inject 20 Units into the skin at bedtime.      . isosorbide mononitrate (IMDUR) 30 MG 24 hr tablet Take 1 tablet (30 mg total) by mouth daily.  30 tablet  11  . Lancets (ONETOUCH ULTRASOFT) lancets Test three times a day dx 250.01  100 each  5  . metoprolol tartrate (LOPRESSOR) 25 MG tablet 1/2 TAB TWO TIMES A DAY  30 tablet  8  . nitroGLYCERIN (NITROSTAT) 0.4 MG SL tablet Take as needed       . pravastatin (PRAVACHOL) 40 MG tablet TAKE ONE TABLET BY  MOUTH TWICE DAILY  60 tablet  5    Past Medical History  Diagnosis Date  . CORONARY ARTERY DISEASE 06/13/2007  . DIABETES MELLITUS, TYPE I, WITH OPHTHALMIC COMPLICATIONS 7/51/0258  . HYPERLIPIDEMIA 06/13/2007  . HYPERTENSION 06/13/2007  . ADENOCARCINOMA, PROSTATE 06/01/2008  . NEOPLASM, MALIGNANT, SKIN, FACE 06/01/2008  . HYPERKALEMIA 06/01/2008    due to DM neparopathy  . CVA 06/01/2008    x 2  . Other chronic nonalcoholic liver disease 04/14/7823  . RENAL DISEASE 06/01/2008  . DM retinopathy     Past Surgical History  Procedure Date  . Cataract extraction   . Cholecystectomy   . Hernia repair     right Inguinal hernia  . Esophagogastroduodenoscopy 06/28/1991  . Rest/stress cardiolite 01/22/2001  . Carotid duplex 09/12/2004    No family history on file.  History   Social History  . Marital Status: Married    Spouse Name: N/A    Number of Children: N/A  . Years of Education: N/A   Occupational History  .  Retired    Social History Main Topics  . Smoking status: Former Smoker    Quit date: 11/18/1978  . Smokeless tobacco: Not on file  . Alcohol Use: No  . Drug Use: No  . Sexually Active: Not on file   Other Topics Concern  . Not on file   Social History Narrative  . No narrative on file    Review of Systems:  All systems reviewed.  They are negative to the above problem except as previously stated.  Vital Signs: BP 148/76  Pulse 59  Ht 5' 9"  (1.753 m)  Wt 208 lb (94.348 kg)  BMI 30.72 kg/m2  Physical Exam Patient is in NAD HEENT:  Normocephalic, atraumatic. EOMI, PERRLA.  Neck: JVP is normal. No thyromegaly. No bruits.  Lungs: clear to auscultation. No rales no wheezes.  Heart: Regular rate and rhythm. Normal S1, S2. No S3.   No significant murmurs. PMI not displaced.  Abdomen:  Supple, nontender. Normal bowel sounds. No masses. No hepatomegaly.  Extremities:   Good distal pulses throughout. No lower extremity edema.  Musculoskeletal :moving all  extremities.  Neuro:   alert and oriented x3.  CN II-XII grossly intact.  EKG:  SB  59 bpm.  First degree AVB  PR 274 msec.  LBBB Assessment and Plan:  1.  CAD>  Cath in 2000.  Myoview in 2010.  Patient asymptomatic.  I would not change regimen.  2.  HTN.  Fair control.  No change  3.  HL  Lipids in September were good.  F/u 1 year.

## 2012-05-18 NOTE — Progress Notes (Signed)
Patient ID: Chad Mcmillan, male   DOB: 01/19/1924, 77 y.o.   MRN: [id number]

## 2012-05-23 DIAGNOSIS — S61409A Unspecified open wound of unspecified hand, initial encounter: Secondary | ICD-10-CM | POA: Insufficient documentation

## 2012-05-23 DIAGNOSIS — I2510 Atherosclerotic heart disease of native coronary artery without angina pectoris: Secondary | ICD-10-CM | POA: Insufficient documentation

## 2012-05-23 DIAGNOSIS — Z8673 Personal history of transient ischemic attack (TIA), and cerebral infarction without residual deficits: Secondary | ICD-10-CM | POA: Insufficient documentation

## 2012-05-23 DIAGNOSIS — W1809XA Striking against other object with subsequent fall, initial encounter: Secondary | ICD-10-CM | POA: Insufficient documentation

## 2012-05-23 DIAGNOSIS — Z794 Long term (current) use of insulin: Secondary | ICD-10-CM | POA: Insufficient documentation

## 2012-05-23 DIAGNOSIS — S42309A Unspecified fracture of shaft of humerus, unspecified arm, initial encounter for closed fracture: Secondary | ICD-10-CM | POA: Insufficient documentation

## 2012-05-23 DIAGNOSIS — R079 Chest pain, unspecified: Secondary | ICD-10-CM | POA: Insufficient documentation

## 2012-05-23 DIAGNOSIS — E785 Hyperlipidemia, unspecified: Secondary | ICD-10-CM | POA: Insufficient documentation

## 2012-05-23 DIAGNOSIS — E11319 Type 2 diabetes mellitus with unspecified diabetic retinopathy without macular edema: Secondary | ICD-10-CM | POA: Insufficient documentation

## 2012-05-23 DIAGNOSIS — E1039 Type 1 diabetes mellitus with other diabetic ophthalmic complication: Secondary | ICD-10-CM | POA: Insufficient documentation

## 2012-05-23 DIAGNOSIS — I10 Essential (primary) hypertension: Secondary | ICD-10-CM | POA: Insufficient documentation

## 2012-05-24 ENCOUNTER — Emergency Department (HOSPITAL_COMMUNITY): Payer: Medicare Other

## 2012-05-24 ENCOUNTER — Encounter (HOSPITAL_COMMUNITY): Payer: Self-pay | Admitting: *Deleted

## 2012-05-24 ENCOUNTER — Emergency Department (HOSPITAL_COMMUNITY)
Admission: EM | Admit: 2012-05-24 | Discharge: 2012-05-24 | Disposition: A | Discharge status: Home / Self Care | Payer: Medicare Other | Attending: Emergency Medicine | Admitting: Emergency Medicine

## 2012-05-24 DIAGNOSIS — IMO0002 Reserved for concepts with insufficient information to code with codable children: Secondary | ICD-10-CM

## 2012-05-24 DIAGNOSIS — S42309A Unspecified fracture of shaft of humerus, unspecified arm, initial encounter for closed fracture: Secondary | ICD-10-CM

## 2012-05-24 DIAGNOSIS — W19XXXA Unspecified fall, initial encounter: Secondary | ICD-10-CM

## 2012-05-24 NOTE — ED Provider Notes (Signed)
History     CSN: [id number]  Arrival date & time 05/23/12  2345   First MD Initiated Contact with Patient 05/24/12 0117      Chief Complaint  Patient presents with  . Fall    (Consider location/radiation/quality/duration/timing/severity/associated sxs/prior treatment) HPI Comments: Patient had a mechanical fall, while pulling a Lucianne Lei striking his left side on a table.  He now has skin tears to his left hand.  His left forearm has a nonhealing humerus fracture.  At that he bumped, and he, feels a strange sensation in that area, as well as pain to his left ribs without shortness of breath, or bruising  Patient is a 78 y.o. male presenting with fall. The history is provided by the patient.  Fall The accident occurred 1 to 2 hours ago. The fall occurred while walking. He fell from a height of 3 to 5 ft. He landed on a hard floor. Pertinent negatives include no fever, no numbness, no nausea and no headaches.    Past Medical History  Diagnosis Date  . CORONARY ARTERY DISEASE 06/13/2007  . DIABETES MELLITUS, TYPE I, WITH OPHTHALMIC COMPLICATIONS 123456  . HYPERLIPIDEMIA 06/13/2007  . HYPERTENSION 06/13/2007  . ADENOCARCINOMA, PROSTATE 06/01/2008  . NEOPLASM, MALIGNANT, SKIN, FACE 06/01/2008  . HYPERKALEMIA 06/01/2008    due to DM neparopathy  . CVA 06/01/2008    x 2  . Other chronic nonalcoholic liver disease A999333  . RENAL DISEASE 06/01/2008  . DM retinopathy     Past Surgical History  Procedure Date  . Cataract extraction   . Cholecystectomy   . Hernia repair     right Inguinal hernia  . Esophagogastroduodenoscopy 06/28/1991  . Rest/stress cardiolite 01/22/2001  . Carotid duplex 09/12/2004    History reviewed. No pertinent family history.  History  Substance Use Topics  . Smoking status: Former Smoker    Quit date: 11/18/1978  . Smokeless tobacco: Not on file  . Alcohol Use: No      Review of Systems  Constitutional: Negative for fever.  Gastrointestinal:  Negative for nausea.  Musculoskeletal: Negative for back pain.  Skin: Positive for wound.  Neurological: Negative for dizziness, weakness, numbness and headaches.    Allergies  Review of patient's allergies indicates no known allergies.  Home Medications   Current Outpatient Rx  Name Route Sig Dispense Refill  . AMLODIPINE BESYLATE 2.5 MG PO TABS Oral Take 2.5 mg by mouth daily.    . ASPIRIN 325 MG PO TABS Oral Take 325 mg by mouth daily.      . FUROSEMIDE 20 MG PO TABS Oral Take 20 mg by mouth daily.    Marland Kitchen HYDROCHLOROTHIAZIDE 12.5 MG PO CAPS Oral Take 12.5 mg by mouth daily.    . IBUPROFEN 200 MG PO TABS Oral Take 100-200 mg by mouth every 6 (six) hours as needed. For pain    . INSULIN ASPART 100 UNIT/ML [place] SOLN Subcutaneous Inject 8-10 Units into the skin 2 (two) times daily. 10 units in the morning, 8 units in the evening    . INSULIN LISPRO (HUMAN) 100 UNIT/ML  SOLN Subcutaneous Inject 22 Units into the skin at bedtime.    Marland Kitchen METOPROLOL TARTRATE 25 MG PO TABS Oral Take 25 mg by mouth daily.    Marland Kitchen PRAVASTATIN SODIUM 40 MG PO TABS Oral Take 40 mg by mouth every evening.    . BD INSULIN SYRINGE ULTRAFINE 30G X 1/2" 0.3 ML MISC  USE AS DIRECTED FOUR TIMES DAILY 200 each 5  .  GLUCOSE BLOOD VI STRP  Test three times a day dx 250.01. Variable glucoses 100 each 5  . ONETOUCH ULTRASOFT LANCETS MISC  Test three times a day dx 250.01 100 each 5  . NITROGLYCERIN 0.4 MG SL SUBL Sublingual Place 0.4 mg under the tongue every 5 (five) minutes as needed. For chest pain      BP 174/70  Pulse 62  Temp 98.1 F (36.7 C) (Oral)  SpO2 98%  Physical Exam  Constitutional: He appears well-developed.  Eyes: Pupils are equal, round, and reactive to light.  Neck: Normal range of motion.  Cardiovascular: Normal rate.   Pulmonary/Chest: Effort normal and breath sounds normal. He has no wheezes.  Musculoskeletal: Normal range of motion.       Arms:   ED Course  Procedures (including critical care  time)  Labs Reviewed - No data to display No results found.   No diagnosis found.    MDM   Skin tears have been washed Steri-Strips applied, and a dressing, will x-ray, humerus and ribs  Discussed radiology findings with the son, patient and his wife, who will followup with Dr. Veverly Fells on Monday      Garald Balding, NP 05/24/12 [phone]

## 2012-05-24 NOTE — ED Provider Notes (Signed)
Medical screening examination/treatment/procedure(s) were performed by non-physician practitioner and as supervising physician I was immediately available for consultation/collaboration.  Kalman Drape, MD 05/24/12 2151

## 2012-05-24 NOTE — ED Notes (Signed)
Pt states that he tripped over a rug in his living room. Pt landed on left arm, pt has skin abrassions/skin tears to left forearm and hand/ pt denies syncope, loc,blacking out or passing out. Pt ambulatory.

## 2012-05-28 ENCOUNTER — Other Ambulatory Visit: Payer: Self-pay | Admitting: Endocrinology

## 2012-06-29 ENCOUNTER — Other Ambulatory Visit: Payer: Self-pay | Admitting: Endocrinology

## 2012-07-02 ENCOUNTER — Other Ambulatory Visit: Payer: Self-pay | Admitting: Endocrinology

## 2012-07-03 ENCOUNTER — Other Ambulatory Visit: Payer: Self-pay | Admitting: Endocrinology

## 2012-07-06 ENCOUNTER — Other Ambulatory Visit: Payer: Self-pay | Admitting: Endocrinology

## 2012-07-06 MED ORDER — GLUCOSE BLOOD VI STRP: ORAL_STRIP | Status: DC

## 2012-07-06 NOTE — Telephone Encounter (Signed)
Rx faxed to [facility] per their request.

## 2012-07-27 ENCOUNTER — Encounter: Payer: Self-pay | Admitting: Endocrinology

## 2012-07-27 ENCOUNTER — Ambulatory Visit (INDEPENDENT_AMBULATORY_CARE_PROVIDER_SITE_OTHER): Payer: Medicare Other | Admitting: Endocrinology

## 2012-07-27 ENCOUNTER — Other Ambulatory Visit (INDEPENDENT_AMBULATORY_CARE_PROVIDER_SITE_OTHER): Payer: Medicare Other

## 2012-07-27 VITALS — BP 152/62 | HR 56 | Temp 96.7°F | Ht 69.5 in | Wt 210.0 lb

## 2012-07-27 DIAGNOSIS — E1039 Type 1 diabetes mellitus with other diabetic ophthalmic complication: Secondary | ICD-10-CM

## 2012-07-27 DIAGNOSIS — H579 Unspecified disorder of eye and adnexa: Secondary | ICD-10-CM

## 2012-07-27 DIAGNOSIS — Z125 Encounter for screening for malignant neoplasm of prostate: Secondary | ICD-10-CM

## 2012-07-27 DIAGNOSIS — E119 Type 2 diabetes mellitus without complications: Secondary | ICD-10-CM

## 2012-07-27 DIAGNOSIS — N289 Disorder of kidney and ureter, unspecified: Secondary | ICD-10-CM

## 2012-07-27 DIAGNOSIS — Z7189 Other specified counseling: Secondary | ICD-10-CM | POA: Insufficient documentation

## 2012-07-27 DIAGNOSIS — I10 Essential (primary) hypertension: Secondary | ICD-10-CM

## 2012-07-27 DIAGNOSIS — E785 Hyperlipidemia, unspecified: Secondary | ICD-10-CM

## 2012-07-27 DIAGNOSIS — K7689 Other specified diseases of liver: Secondary | ICD-10-CM

## 2012-07-27 DIAGNOSIS — D61818 Other pancytopenia: Secondary | ICD-10-CM

## 2012-07-27 DIAGNOSIS — Z0000 Encounter for general adult medical examination without abnormal findings: Secondary | ICD-10-CM

## 2012-07-27 DIAGNOSIS — Z79899 Other long term (current) drug therapy: Secondary | ICD-10-CM

## 2012-07-27 DIAGNOSIS — D649 Anemia, unspecified: Secondary | ICD-10-CM

## 2012-07-27 LAB — URINALYSIS, ROUTINE W REFLEX MICROSCOPIC
Bilirubin Urine: NEGATIVE
Nitrite: NEGATIVE
Total Protein, Urine: NEGATIVE
Urine Glucose: NEGATIVE
pH: 5.5 (ref 5.0–8.0)

## 2012-07-27 LAB — CBC WITH DIFFERENTIAL/PLATELET
Basophils Relative: 0.5 % (ref 0.0–3.0)
Eosinophils Relative: 1.7 % (ref 0.0–5.0)
HCT: 35.8 % — ABNORMAL LOW (ref 39.0–52.0)
Hemoglobin: 11.6 g/dL — ABNORMAL LOW (ref 13.0–17.0)
Lymphs Abs: 1.3 10*3/uL (ref 0.7–4.0)
MCV: 94.1 fl (ref 78.0–100.0)
Monocytes Absolute: 0.5 10*3/uL (ref 0.1–1.0)
Monocytes Relative: 10.1 % (ref 3.0–12.0)
Neutro Abs: 2.7 10*3/uL (ref 1.4–7.7)
Platelets: 108 10*3/uL — ABNORMAL LOW (ref 150.0–400.0)
RBC: 3.81 Mil/uL — ABNORMAL LOW (ref 4.22–5.81)
WBC: 4.6 10*3/uL (ref 4.5–10.5)

## 2012-07-27 LAB — BASIC METABOLIC PANEL
CO2: 25 mEq/L (ref 19–32)
Chloride: 105 mEq/L (ref 96–112)
Glucose, Bld: 226 mg/dL — ABNORMAL HIGH (ref 70–99)
Sodium: 138 mEq/L (ref 135–145)

## 2012-07-27 LAB — MICROALBUMIN / CREATININE URINE RATIO: Microalb Creat Ratio: 7.8 mg/g (ref 0.0–30.0)

## 2012-07-27 LAB — HEPATIC FUNCTION PANEL
Albumin: 3.9 g/dL (ref 3.5–5.2)
Total Bilirubin: 0.7 mg/dL (ref 0.3–1.2)

## 2012-07-27 LAB — LIPID PANEL
Cholesterol: 121 mg/dL (ref 0–200)
HDL: 36.6 mg/dL — ABNORMAL LOW (ref >39.00)
LDL Cholesterol: 67 mg/dL (ref 0–99)
Triglycerides: 85 mg/dL (ref 0.0–149.0)

## 2012-07-27 LAB — PSA, MEDICARE: PSA: 0.69 ng/ml (ref 0.10–4.00)

## 2012-07-27 NOTE — Patient Instructions (Addendum)
good diet and exercise habits significanly improve the control of your diabetes.  please let me know if you wish to be referred to a dietician.  high blood sugar is very risky to your health.  you should see an eye doctor every year.  You are at higher than average risk for pneumonia and hepatitis-B.  You should be vaccinated against both.   please consider these measures for your health:  minimize alcohol.  do not use tobacco products.  have a colonoscopy at least every 10 years from age 32.  keep firearms safely stored.  always use seat belts.  have working smoke alarms in your home.  see an eye doctor and dentist regularly.  never drive under the influence of alcohol or drugs (including prescription drugs).  those with fair skin should take precautions against the sun. please let me know what your wishes would be, if artificial life support measures should become necessary.  it is critically important to prevent falling down (keep floor areas well-lit, dry, and free of loose objects.  If you have a cane, walker, or wheelchair, you should use it, even for short trips around the house.  Also, try not to rush).   blood tests are being requested for you today.  You will receive a letter with results.   You should have a vaccine against shingles (a painful rash which results from the  chickenpox infection which most people had many years ago).  This vaccine reduces, but does not totally eliminate the risk of shingles.  Because this is a medicare part d benefit, you should get it at a pharmacy.   Please come back for a follow-up appointment in 4 months Update: we discussed code status.  pt requests full code, but would not want to be started or maintained on artificial life-support measures if there was not a reasonable chance of recovery)

## 2012-07-27 NOTE — Progress Notes (Signed)
Subjective:    Patient ID: Chad Mcmillan, male    DOB: 25-Nov-1923, 78 y.o.   MRN: [id number]  HPI The state of at least three ongoing medical problems is addressed today: Pt returns for f/u of insulin-requiring DM (dx'ed 123XX123, complicated by renal insuff, CAD, retinopathy, and CVA). no cbg record, but states cbg's are well-controlled.  denies hypoglycemia Anemia: denies brbpr and hematuria.   D]renal failure: denies weight change.   Past Medical History  Diagnosis Date  . CORONARY ARTERY DISEASE 06/13/2007  . DIABETES MELLITUS, TYPE I, WITH OPHTHALMIC COMPLICATIONS 123456  . HYPERLIPIDEMIA 06/13/2007  . HYPERTENSION 06/13/2007  . ADENOCARCINOMA, PROSTATE 06/01/2008  . NEOPLASM, MALIGNANT, SKIN, FACE 06/01/2008  . HYPERKALEMIA 06/01/2008    due to DM neparopathy  . CVA 06/01/2008    x 2  . Other chronic nonalcoholic liver disease A999333  . RENAL DISEASE 06/01/2008  . DM retinopathy     Past Surgical History  Procedure Date  . Cataract extraction   . Cholecystectomy   . Hernia repair     right Inguinal hernia  . Esophagogastroduodenoscopy 06/28/1991  . Rest/stress cardiolite 01/22/2001  . Carotid duplex 09/12/2004    History   Social History  . Marital Status: Married    Spouse Name: N/A    Number of Children: N/A  . Years of Education: N/A   Occupational History  . Retired    Social History Main Topics  . Smoking status: Former Smoker    Quit date: 11/18/1978  . Smokeless tobacco: Not on file  . Alcohol Use: No  . Drug Use: No  . Sexually Active: Not on file   Other Topics Concern  . Not on file   Social History Narrative  . No narrative on file    Current Outpatient Prescriptions on File Prior to Visit  Medication Sig Dispense Refill  . amLODipine (NORVASC) 2.5 MG tablet Take 2.5 mg by mouth daily.      Marland Kitchen aspirin 325 MG tablet Take 325 mg by mouth daily.        . B-D INS SYR ULTRAFINE .3CC/30G 30G X 1/2" 0.3 ML MISC USE AS DIRECTED FOUR TIMES DAILY  200  each  5  . furosemide (LASIX) 20 MG tablet Take 20 mg by mouth daily.      Marland Kitchen glucose blood (ONE TOUCH ULTRA TEST) test strip Use as directed to check blood sugar twice daily dx 250.51  200 each  3  . hydrochlorothiazide (MICROZIDE) 12.5 MG capsule Take 12.5 mg by mouth daily.      Marland Kitchen ibuprofen (ADVIL,MOTRIN) 200 MG tablet Take 100-200 mg by mouth every 6 (six) hours as needed. For pain      . insulin aspart (NOVOLOG) 100 UNIT/ML injection 10 units in the morning, 10 units in the evening      . insulin NPH (NOVOLIN N RELION) 100 UNIT/ML injection Inject 20 Units into the skin at bedtime.  10 mL  4  . Lancets (ONETOUCH ULTRASOFT) lancets Use as directed to check blood sugar twice daily dx 250.01  200 each  3  . metoprolol tartrate (LOPRESSOR) 25 MG tablet Take 25 mg by mouth daily.      . nitroGLYCERIN (NITROSTAT) 0.4 MG SL tablet Place 0.4 mg under the tongue every 5 (five) minutes as needed. For chest pain      . pravastatin (PRAVACHOL) 40 MG tablet TAKE ONE TABLET BY MOUTH TWICE DAILY  60 tablet  5    No Known Allergies  No family history on file.  BP 152/62  Pulse 56  Temp 96.7 F (35.9 C) (Oral)  Ht 5' 9.5" (1.765 m)  Wt 210 lb (95.255 kg)  BMI 30.57 kg/m2  SpO2 97%    Review of Systems Denies chest pain and sob    Objective:   Physical Exam VITAL SIGNS:  See vs page GENERAL: no distress LUNGS:  Clear to auscultation HEART:  Regular rate and rhythm without murmurs noted. Normal S1,S2.   Pulses: dorsalis pedis intact bilat.   Feet: no deformity.  no ulcer on the feet.  feet are of normal color and temp.  1+ bilat leg edema. There is bilateral onychomycosis, and varicosities Neuro: sensation is intact to touch on the feet, but decreased from normal.      Lab Results  Component Value Date   WBC 4.6 07/27/2012   HGB 11.6* 07/27/2012   HCT 35.8* 07/27/2012   PLT 108.0* 07/27/2012   GLUCOSE 226* 07/27/2012   CHOL 121 07/27/2012   TRIG 85.0 07/27/2012   HDL 36.60* 07/27/2012   LDLCALC  67 07/27/2012   ALT 22 07/27/2012   AST 29 07/27/2012   NA 138 07/27/2012   K 4.9 07/27/2012   CL 105 07/27/2012   CREATININE 2.0* 07/27/2012   BUN 58* 07/27/2012   CO2 25 07/27/2012   TSH 2.84 07/27/2012   PSA 0.69 07/27/2012   HGBA1C 7.5* 04/06/2012   MICROALBUR 3.6* 07/27/2012      Assessment & Plan:  Renal insuff, unchanged Anemia, unchanged DM, apparently well-controlled   Subjective:   Patient here for Medicare annual wellness visit and management of other chronic and acute problems.     Risk factors: advanced age    28 of Physicians Providing Medical Care to Patient:  See "snapshot"   Activities of Daily Living: In your present state of health, do you have any difficulty performing the following activities?:  Preparing food and eating?: No  Bathing yourself: No  Getting dressed: No  Using the toilet:No  Moving around from place to place: No  In the past year have you fallen or had a near fall?: yes  Home Safety: Has smoke detector and wears seat belts. No firearms. No excess sun exposure.  Diet and Exercise  Current exercise habits: pt says good Dietary issues discussed: pt reports a healthy diet   Depression Screen  Q1: Over the past two weeks, have you felt down, depressed or hopeless? no  Q2: Over the past two weeks, have you felt little interest or pleasure in doing things? no   The following portions of the patient's history were reviewed and updated as appropriate: allergies, current medications, past family history, past medical history, past social history, past surgical history and problem list.  Past Medical History  Diagnosis Date  . CORONARY ARTERY DISEASE 06/13/2007  . DIABETES MELLITUS, TYPE I, WITH OPHTHALMIC COMPLICATIONS 123456  . HYPERLIPIDEMIA 06/13/2007  . HYPERTENSION 06/13/2007  . ADENOCARCINOMA, PROSTATE 06/01/2008  . NEOPLASM, MALIGNANT, SKIN, FACE 06/01/2008  . HYPERKALEMIA 06/01/2008    due to DM neparopathy  . CVA 06/01/2008    x 2  . Other chronic  nonalcoholic liver disease A999333  . RENAL DISEASE 06/01/2008  . DM retinopathy     Past Surgical History  Procedure Date  . Cataract extraction   . Cholecystectomy   . Hernia repair     right Inguinal hernia  . Esophagogastroduodenoscopy 06/28/1991  . Rest/stress cardiolite 01/22/2001  . Carotid duplex 09/12/2004  History   Social History  . Marital Status: Married    Spouse Name: N/A    Number of Children: N/A  . Years of Education: N/A   Occupational History  . Retired    Social History Main Topics  . Smoking status: Former Smoker    Quit date: 11/18/1978  . Smokeless tobacco: Not on file  . Alcohol Use: No  . Drug Use: No  . Sexually Active: Not on file   Other Topics Concern  . Not on file   Social History Narrative  . No narrative on file    Current Outpatient Prescriptions on File Prior to Visit  Medication Sig Dispense Refill  . amLODipine (NORVASC) 2.5 MG tablet Take 2.5 mg by mouth daily.      Marland Kitchen aspirin 325 MG tablet Take 325 mg by mouth daily.        . B-D INS SYR ULTRAFINE .3CC/30G 30G X 1/2" 0.3 ML MISC USE AS DIRECTED FOUR TIMES DAILY  200 each  5  . furosemide (LASIX) 20 MG tablet Take 20 mg by mouth daily.      Marland Kitchen glucose blood (ONE TOUCH ULTRA TEST) test strip Use as directed to check blood sugar twice daily dx 250.51  200 each  3  . hydrochlorothiazide (MICROZIDE) 12.5 MG capsule Take 12.5 mg by mouth daily.      Marland Kitchen ibuprofen (ADVIL,MOTRIN) 200 MG tablet Take 100-200 mg by mouth every 6 (six) hours as needed. For pain      . insulin aspart (NOVOLOG) 100 UNIT/ML injection 10 units in the morning, 10 units in the evening      . insulin NPH (NOVOLIN N RELION) 100 UNIT/ML injection Inject 20 Units into the skin at bedtime.  10 mL  4  . Lancets (ONETOUCH ULTRASOFT) lancets Use as directed to check blood sugar twice daily dx 250.01  200 each  3  . metoprolol tartrate (LOPRESSOR) 25 MG tablet Take 25 mg by mouth daily.      . nitroGLYCERIN  (NITROSTAT) 0.4 MG SL tablet Place 0.4 mg under the tongue every 5 (five) minutes as needed. For chest pain      . pravastatin (PRAVACHOL) 40 MG tablet TAKE ONE TABLET BY MOUTH TWICE DAILY  60 tablet  5    No Known Allergies  No family history on file.  BP 152/62  Pulse 56  Temp 96.7 F (35.9 C) (Oral)  Ht 5' 9.5" (1.765 m)  Wt 210 lb (95.255 kg)  BMI 30.57 kg/m2  SpO2 97%   Review of Systems  Denies hearing loss, and visual loss Objective:   Vision:  Sees opthalmologist Hearing: grossly normal Body mass index:  See vs page Msk: pt easily and quickly performs "get-up-and-go" from a sitting position Cognitive Impairment Assessment: cognition, memory and judgment appear normal.  remembers 3/3 at 5 minutes.  excellent recall.  can easily read and write a sentence.  alert and oriented x 3   Assessment:   Medicare wellness utd on preventive parameters    Plan:   During the course of the visit the patient was educated and counseled about appropriate screening and preventive services including:        Fall prevention Diabetes screening  Nutrition counseling   Vaccines / LABS Zostavax / Pnemonccoal Vaccine  today  PSA  Patient Instructions (the written plan) was given to the patient.

## 2012-07-28 ENCOUNTER — Encounter: Payer: Self-pay | Admitting: Endocrinology

## 2012-07-29 ENCOUNTER — Other Ambulatory Visit: Payer: Self-pay | Admitting: Endocrinology

## 2012-07-30 ENCOUNTER — Telehealth: Payer: Self-pay | Admitting: *Deleted

## 2012-07-30 NOTE — Telephone Encounter (Signed)
Called pt to inform of lab results, pt informed (letter also mailed to pt). 

## 2012-08-07 ENCOUNTER — Other Ambulatory Visit: Payer: Self-pay | Admitting: Endocrinology

## 2012-08-22 ENCOUNTER — Other Ambulatory Visit: Payer: Self-pay | Admitting: Endocrinology

## 2012-08-24 ENCOUNTER — Ambulatory Visit (INDEPENDENT_AMBULATORY_CARE_PROVIDER_SITE_OTHER): Payer: Medicare Other

## 2012-08-24 DIAGNOSIS — Z23 Encounter for immunization: Secondary | ICD-10-CM

## 2012-08-24 NOTE — Telephone Encounter (Signed)
Refill request for Novolog. This is a historical med. Pt last seen on 07/27/12. Please advise.

## 2012-08-25 DIAGNOSIS — Z23 Encounter for immunization: Secondary | ICD-10-CM

## 2012-10-12 ENCOUNTER — Encounter (INDEPENDENT_AMBULATORY_CARE_PROVIDER_SITE_OTHER): Payer: Medicare Other | Admitting: Ophthalmology

## 2012-10-12 DIAGNOSIS — H35039 Hypertensive retinopathy, unspecified eye: Secondary | ICD-10-CM

## 2012-10-12 DIAGNOSIS — E11319 Type 2 diabetes mellitus with unspecified diabetic retinopathy without macular edema: Secondary | ICD-10-CM

## 2012-10-12 DIAGNOSIS — I10 Essential (primary) hypertension: Secondary | ICD-10-CM

## 2012-10-12 DIAGNOSIS — E1165 Type 2 diabetes mellitus with hyperglycemia: Secondary | ICD-10-CM

## 2012-10-12 DIAGNOSIS — H43819 Vitreous degeneration, unspecified eye: Secondary | ICD-10-CM

## 2012-10-27 ENCOUNTER — Other Ambulatory Visit: Payer: Self-pay | Admitting: *Deleted

## 2012-10-27 MED ORDER — GLUCOSE BLOOD VI STRP: ORAL_STRIP | Status: DC

## 2012-10-27 NOTE — Telephone Encounter (Signed)
rx printed for test strips to be faxed.

## 2012-11-30 ENCOUNTER — Ambulatory Visit (INDEPENDENT_AMBULATORY_CARE_PROVIDER_SITE_OTHER): Payer: Medicare Other | Admitting: Endocrinology

## 2012-11-30 ENCOUNTER — Other Ambulatory Visit: Payer: Self-pay | Admitting: Endocrinology

## 2012-11-30 ENCOUNTER — Encounter: Payer: Self-pay | Admitting: Endocrinology

## 2012-11-30 VITALS — BP 128/76 | HR 59 | Temp 98.7°F | Wt 209.0 lb

## 2012-11-30 DIAGNOSIS — E119 Type 2 diabetes mellitus without complications: Secondary | ICD-10-CM

## 2012-11-30 DIAGNOSIS — D649 Anemia, unspecified: Secondary | ICD-10-CM

## 2012-11-30 LAB — CBC WITH DIFFERENTIAL/PLATELET
Basophils Relative: 0.4 % (ref 0.0–3.0)
Eosinophils Relative: 1.5 % (ref 0.0–5.0)
HCT: 36.8 % — ABNORMAL LOW (ref 39.0–52.0)
Lymphs Abs: 1.2 10*3/uL (ref 0.7–4.0)
MCV: 90.7 fl (ref 78.0–100.0)
Monocytes Absolute: 0.4 10*3/uL (ref 0.1–1.0)
Neutro Abs: 2.9 10*3/uL (ref 1.4–7.7)
RBC: 4.05 Mil/uL — ABNORMAL LOW (ref 4.22–5.81)
WBC: 4.6 10*3/uL (ref 4.5–10.5)

## 2012-11-30 NOTE — Patient Instructions (Addendum)
blood tests are being requested for you today.  We'll contact you with results. check your blood sugar twice a day.  vary the time of day when you check, between before the 3 meals, and at bedtime.  also check if you have symptoms of your blood sugar being too high or too low.  please keep a record of the readings and bring it to your next appointment here.  please call us sooner if your blood sugar goes below 70, or if you have a lot of readings over 200. Please come back for a follow-up appointment in 3 months. 

## 2012-11-30 NOTE — Progress Notes (Signed)
Subjective:    Patient ID: Chad Mcmillan, male    DOB: 09-07-1924, 79 y.o.   MRN: [id number]  HPI Pt returns for f/u of insulin-requiring DM (dx'ed 123XX123, complicated by renal insuff, CAD, retinopathy, and CVA). no cbg record, but states cbg's are well-controlled, except slightly high at lunch.  pt states he feels well in general.   Past Medical History  Diagnosis Date  . CORONARY ARTERY DISEASE 06/13/2007  . DIABETES MELLITUS, TYPE I, WITH OPHTHALMIC COMPLICATIONS 123456  . HYPERLIPIDEMIA 06/13/2007  . HYPERTENSION 06/13/2007  . ADENOCARCINOMA, PROSTATE 06/01/2008  . NEOPLASM, MALIGNANT, SKIN, FACE 06/01/2008  . HYPERKALEMIA 06/01/2008    due to DM neparopathy  . CVA 06/01/2008    x 2  . Other chronic nonalcoholic liver disease A999333  . RENAL DISEASE 06/01/2008  . DM retinopathy     Past Surgical History  Procedure Date  . Cataract extraction   . Cholecystectomy   . Hernia repair     right Inguinal hernia  . Esophagogastroduodenoscopy 06/28/1991  . Rest/stress cardiolite 01/22/2001  . Carotid duplex 09/12/2004    History   Social History  . Marital Status: Married    Spouse Name: N/A    Number of Children: N/A  . Years of Education: N/A   Occupational History  . Retired    Social History Main Topics  . Smoking status: Former Smoker    Quit date: 11/18/1978  . Smokeless tobacco: Not on file  . Alcohol Use: No  . Drug Use: No  . Sexually Active: Not on file   Other Topics Concern  . Not on file   Social History Narrative  . No narrative on file    Current Outpatient Prescriptions on File Prior to Visit  Medication Sig Dispense Refill  . amLODipine (NORVASC) 2.5 MG tablet TAKE 1 TABLET BY MOUTH EVERY DAY  30 tablet  11  . aspirin 325 MG tablet Take 325 mg by mouth daily.        . B-D INS SYR ULTRAFINE .3CC/30G 30G X 1/2" 0.3 ML MISC USE AS DIRECTED FOUR TIMES DAILY  200 each  5  . furosemide (LASIX) 20 MG tablet Take 20 mg by mouth daily.      . furosemide  (LASIX) 20 MG tablet TAKE 1 TABLET EVERY DAY  30 tablet  10  . glucose blood (ONE TOUCH ULTRA TEST) test strip Use as directed to check blood sugar twice daily dx 250.51  200 each  3  . hydrochlorothiazide (MICROZIDE) 12.5 MG capsule Take 12.5 mg by mouth daily.      Marland Kitchen ibuprofen (ADVIL,MOTRIN) 200 MG tablet Take 100-200 mg by mouth every 6 (six) hours as needed. For pain      . insulin aspart (NOVOLOG) 100 UNIT/ML injection 15 units with breakfast, and 10 units with the evening meal      . insulin NPH (NOVOLIN N RELION) 100 UNIT/ML injection Inject 20 Units into the skin at bedtime.  10 mL  4  . isosorbide mononitrate (IMDUR) 30 MG 24 hr tablet Take 30 mg by mouth daily.       . Lancets (ONETOUCH ULTRASOFT) lancets Use as directed to check blood sugar twice daily dx 250.01  200 each  3  . metoprolol tartrate (LOPRESSOR) 25 MG tablet Take 25 mg by mouth daily.      . metoprolol tartrate (LOPRESSOR) 25 MG tablet 1/2 TAB TWO TIMES A DAY  30 tablet  5  . nitroGLYCERIN (NITROSTAT) 0.4 MG  SL tablet Place 0.4 mg under the tongue every 5 (five) minutes as needed. For chest pain      . NOVOLOG 100 UNIT/ML injection INJECT 3 TIMES A DAY BEFORE MEALS 8-0-10 UNITS  10 mL  5    No Known Allergies  No family history on file.  BP 128/76  Pulse 59  Temp 98.7 F (37.1 C) (Oral)  Wt 209 lb (94.802 kg)  SpO2 97%  Review of Systems denies hypoglycemia    Objective:   Physical Exam VITAL SIGNS:  See vs page GENERAL: no distress SKIN:  Insulin injection sites at the anterior abdomen are normal.     Lab Results  Component Value Date   HGBA1C 8.9* 11/30/2012      Assessment & Plan:  DM: needs increased rx

## 2012-12-23 ENCOUNTER — Telehealth: Payer: Self-pay | Admitting: *Deleted

## 2012-12-23 NOTE — Telephone Encounter (Signed)
Please advise ov 

## 2012-12-24 NOTE — Telephone Encounter (Signed)
The patient is scheduled for 3:15pm on Friday 12/25/12.

## 2012-12-25 ENCOUNTER — Ambulatory Visit (INDEPENDENT_AMBULATORY_CARE_PROVIDER_SITE_OTHER): Payer: Medicare Other | Admitting: Endocrinology

## 2012-12-25 VITALS — BP 126/70 | HR 80 | Temp 98.7°F | Wt 211.0 lb

## 2012-12-25 DIAGNOSIS — R05 Cough: Secondary | ICD-10-CM

## 2012-12-25 MED ORDER — FLUTICASONE-SALMETEROL 100-50 MCG/DOSE IN AEPB: 1.0000 | INHALATION_SPRAY | Freq: Two times a day (BID) | RESPIRATORY_TRACT | Status: DC

## 2012-12-25 MED ORDER — CEFUROXIME AXETIL 250 MG PO TABS: 250.0000 mg | ORAL_TABLET | Freq: Two times a day (BID) | ORAL | Status: AC

## 2012-12-25 MED ORDER — PROMETHAZINE-CODEINE 6.25-10 MG/5ML PO SYRP: 5.0000 mL | ORAL_SOLUTION | ORAL | Status: AC | PRN

## 2012-12-25 NOTE — Progress Notes (Signed)
Subjective:    Patient ID: Chad Mcmillan, male    DOB: Jul 02, 1924, 79 y.o.   MRN: [id number]  HPI Pt states few days of moderate prod-quality cough in the chest, and assoc nasal congestion. He says cbg's are now well-controlled. Past Medical History  Diagnosis Date  . CORONARY ARTERY DISEASE 06/13/2007  . DIABETES MELLITUS, TYPE I, WITH OPHTHALMIC COMPLICATIONS 123456  . HYPERLIPIDEMIA 06/13/2007  . HYPERTENSION 06/13/2007  . ADENOCARCINOMA, PROSTATE 06/01/2008  . NEOPLASM, MALIGNANT, SKIN, FACE 06/01/2008  . HYPERKALEMIA 06/01/2008    due to DM neparopathy  . CVA 06/01/2008    x 2  . Other chronic nonalcoholic liver disease [id number]  . RENAL DISEASE 06/01/2008  . DM retinopathy     Past Surgical History  Procedure Date  . Cataract extraction   . Cholecystectomy   . Hernia repair     right Inguinal hernia  . Esophagogastroduodenoscopy 06/28/1991  . Rest/stress cardiolite 01/22/2001  . Carotid duplex 09/12/2004    History   Social History  . Marital Status: Married    Spouse Name: N/A    Number of Children: N/A  . Years of Education: N/A   Occupational History  . Retired    Social History Main Topics  . Smoking status: Former Smoker    Quit date: 11/18/1978  . Smokeless tobacco: Not on file  . Alcohol Use: No  . Drug Use: No  . Sexually Active: Not on file   Other Topics Concern  . Not on file   Social History Narrative  . No narrative on file    Current Outpatient Prescriptions on File Prior to Visit  Medication Sig Dispense Refill  . amLODipine (NORVASC) 2.5 MG tablet TAKE 1 TABLET BY MOUTH EVERY DAY  30 tablet  11  . aspirin 325 MG tablet Take 325 mg by mouth daily.        . B-D INS SYR ULTRAFINE .3CC/30G 30G X 1/2" 0.3 ML MISC USE AS DIRECTED FOUR TIMES DAILY  200 each  5  . furosemide (LASIX) 20 MG tablet Take 20 mg by mouth daily.      . furosemide (LASIX) 20 MG tablet TAKE 1 TABLET EVERY DAY  30 tablet  10  . glucose blood (ONE TOUCH ULTRA TEST)  test strip Use as directed to check blood sugar twice daily dx 250.51  200 each  3  . hydrochlorothiazide (MICROZIDE) 12.5 MG capsule Take 12.5 mg by mouth daily.      Marland Kitchen ibuprofen (ADVIL,MOTRIN) 200 MG tablet Take 100-200 mg by mouth every 6 (six) hours as needed. For pain      . insulin aspart (NOVOLOG) 100 UNIT/ML injection 15 units with breakfast, and 10 units with the evening meal      . insulin NPH (NOVOLIN N RELION) 100 UNIT/ML injection Inject 20 Units into the skin at bedtime.  10 mL  4  . isosorbide mononitrate (IMDUR) 30 MG 24 hr tablet Take 30 mg by mouth daily.       . Lancets (ONETOUCH ULTRASOFT) lancets Use as directed to check blood sugar twice daily dx 250.01  200 each  3  . metoprolol tartrate (LOPRESSOR) 25 MG tablet Take 25 mg by mouth daily.      . metoprolol tartrate (LOPRESSOR) 25 MG tablet 1/2 TAB TWO TIMES A DAY  30 tablet  5  . nitroGLYCERIN (NITROSTAT) 0.4 MG SL tablet Place 0.4 mg under the tongue every 5 (five) minutes as needed. For chest pain      .  NOVOLOG 100 UNIT/ML injection INJECT 3 TIMES A DAY BEFORE MEALS 8-0-10 UNITS  10 mL  5  . pravastatin (PRAVACHOL) 40 MG tablet TAKE ONE TABLET BY MOUTH TWICE DAILY  60 tablet  4    No Known Allergies  No family history on file.  BP 126/70  Pulse 80  Temp 98.7 F (37.1 C) (Oral)  Wt 211 lb (95.709 kg)  SpO2 96%  Review of Systems Denies fever and sob, but he has wheezing    Objective:   Physical Exam VITAL SIGNS:  See vs page GENERAL: no distress head: no deformity eyes: no periorbital swelling, no proptosis external nose and ears are normal mouth: no lesion seen Both eac's and tm's are normal LUNGS:  Clear to auscultation     Assessment & Plan:  Acute bronchitis, new Renal failure.  This affects choice of abx DM, with improved control.

## 2012-12-25 NOTE — Patient Instructions (Addendum)
Here is a prescription for the cough syrup. i have sent 2 other prescriptions to your pharmacy. I hope you feel better soon.  If you don't feel better by next week, please call back.  Please call sooner if you get worse.

## 2013-01-04 ENCOUNTER — Telehealth: Payer: Self-pay | Admitting: Endocrinology

## 2013-01-04 MED ORDER — ISOSORBIDE MONONITRATE ER 30 MG PO TB24: 30.0000 mg | ORAL_TABLET | Freq: Every day | ORAL | Status: DC

## 2013-01-04 NOTE — Telephone Encounter (Signed)
The patient states his rx for Imdur 68m was denied.  The patient would like to know why and would like to know if he can get a new rx for the same medication.  The patient may be reached at [phone] needed.

## 2013-03-01 ENCOUNTER — Other Ambulatory Visit: Payer: Self-pay | Admitting: *Deleted

## 2013-03-01 ENCOUNTER — Other Ambulatory Visit: Payer: Self-pay | Admitting: Endocrinology

## 2013-03-01 MED ORDER — HYDRALAZINE HCL 10 MG PO TABS: 10.0000 mg | ORAL_TABLET | Freq: Three times a day (TID) | ORAL | Status: DC

## 2013-03-01 MED ORDER — "INSULIN SYRINGE-NEEDLE U-100 30G X 1/2"" 0.3 ML MISC": Status: DC

## 2013-03-01 MED ORDER — METOPROLOL TARTRATE 25 MG PO TABS: ORAL_TABLET | ORAL | Status: DC

## 2013-03-01 MED ORDER — INSULIN NPH (HUMAN) (ISOPHANE) 100 UNIT/ML ~~LOC~~ SUSP: 20.0000 [IU] | Freq: Every day | SUBCUTANEOUS | Status: DC

## 2013-03-02 ENCOUNTER — Other Ambulatory Visit: Payer: Self-pay | Admitting: *Deleted

## 2013-03-02 MED ORDER — "INSULIN SYRINGE-NEEDLE U-100 30G X 1/2"" 0.3 ML MISC": Status: DC

## 2013-03-02 NOTE — Telephone Encounter (Signed)
Rx sent to wrong pharmacy. Resending to [facility] on [address]

## 2013-03-03 ENCOUNTER — Other Ambulatory Visit: Payer: Self-pay | Admitting: *Deleted

## 2013-03-03 MED ORDER — HYDRALAZINE HCL 10 MG PO TABS: 10.0000 mg | ORAL_TABLET | Freq: Three times a day (TID) | ORAL | Status: DC

## 2013-03-03 NOTE — Telephone Encounter (Signed)
Rx did not go through electronically. Resending.  

## 2013-03-09 ENCOUNTER — Encounter: Payer: Self-pay | Admitting: Endocrinology

## 2013-03-09 ENCOUNTER — Ambulatory Visit (INDEPENDENT_AMBULATORY_CARE_PROVIDER_SITE_OTHER): Payer: Medicare Other | Admitting: Endocrinology

## 2013-03-09 VITALS — BP 128/70 | HR 63 | Wt 218.0 lb

## 2013-03-09 DIAGNOSIS — E1039 Type 1 diabetes mellitus with other diabetic ophthalmic complication: Secondary | ICD-10-CM

## 2013-03-09 DIAGNOSIS — E119 Type 2 diabetes mellitus without complications: Secondary | ICD-10-CM

## 2013-03-09 LAB — HEMOGLOBIN A1C: Hgb A1c MFr Bld: 8.9 % — ABNORMAL HIGH (ref 4.6–6.5)

## 2013-03-09 NOTE — Progress Notes (Signed)
Subjective:    Patient ID: Chad Mcmillan, male    DOB: 06/06/1924, 79 y.o.   MRN: [id number]  HPI Pt returns for f/u of insulin-requiring DM (dx'ed 123XX123, complicated by renal insuff, CAD, retinopathy, and CVA; he has never had severe hypoglycemia or DKA).  he brings a record of his cbg's which i have reviewed today.  It varies from 100-200.  It is highest in the afternoon, and lowest in am and lunch. pt states he feels well in general.  Past Medical History  Diagnosis Date  . CORONARY ARTERY DISEASE 06/13/2007  . DIABETES MELLITUS, TYPE I, WITH OPHTHALMIC COMPLICATIONS 123456  . HYPERLIPIDEMIA 06/13/2007  . HYPERTENSION 06/13/2007  . ADENOCARCINOMA, PROSTATE 06/01/2008  . NEOPLASM, MALIGNANT, SKIN, FACE 06/01/2008  . HYPERKALEMIA 06/01/2008    due to DM neparopathy  . CVA 06/01/2008    x 2  . Other chronic nonalcoholic liver disease [id number]  . RENAL DISEASE 06/01/2008  . DM retinopathy     Past Surgical History  Procedure Laterality Date  . Cataract extraction    . Cholecystectomy    . Hernia repair      right Inguinal hernia  . Esophagogastroduodenoscopy  06/28/1991  . Rest/stress cardiolite  01/22/2001  . Carotid duplex  09/12/2004    History   Social History  . Marital Status: Married    Spouse Name: N/A    Number of Children: N/A  . Years of Education: N/A   Occupational History  . Retired    Social History Main Topics  . Smoking status: Former Smoker    Quit date: 11/18/1978  . Smokeless tobacco: Not on file  . Alcohol Use: No  . Drug Use: No  . Sexually Active: Not on file   Other Topics Concern  . Not on file   Social History Narrative  . No narrative on file    Current Outpatient Prescriptions on File Prior to Visit  Medication Sig Dispense Refill  . amLODipine (NORVASC) 2.5 MG tablet TAKE 1 TABLET BY MOUTH EVERY DAY  30 tablet  11  . aspirin 325 MG tablet Take 325 mg by mouth daily.        . Fluticasone-Salmeterol (ADVAIR) 100-50 MCG/DOSE AEPB  Inhale 1 puff into the lungs 2 (two) times daily.  1 each  3  . furosemide (LASIX) 20 MG tablet Take 20 mg by mouth daily.      Marland Kitchen glucose blood (ONE TOUCH ULTRA TEST) test strip Use as directed to check blood sugar twice daily dx 250.51  200 each  3  . hydrALAZINE (APRESOLINE) 10 MG tablet Take 1 tablet (10 mg total) by mouth 3 (three) times daily.  90 tablet  4  . hydrochlorothiazide (MICROZIDE) 12.5 MG capsule Take 12.5 mg by mouth daily.      Marland Kitchen ibuprofen (ADVIL,MOTRIN) 200 MG tablet Take 100-200 mg by mouth every 6 (six) hours as needed. For pain      . insulin aspart (NOVOLOG) 100 UNIT/ML injection 15 units with breakfast, and 10 units with the evening meal      . insulin NPH (NOVOLIN N RELION) 100 UNIT/ML injection Inject 20 Units into the skin at bedtime.  10 mL  4  . Insulin Syringe-Needle U-100 (B-D INS SYR ULTRAFINE .3CC/30G) 30G X 1/2" 0.3 ML MISC USE AS DIRECTED 4 (FOUR) TIMES DAILY  200 each  4  . isosorbide mononitrate (IMDUR) 30 MG 24 hr tablet Take 1 tablet (30 mg total) by mouth daily.  30 tablet  6  . Lancets (ONETOUCH ULTRASOFT) lancets Use as directed to check blood sugar twice daily dx 250.01  200 each  3  . metoprolol tartrate (LOPRESSOR) 25 MG tablet Take 25 mg by mouth daily.      . metoprolol tartrate (LOPRESSOR) 25 MG tablet TAKE 1/2 TABLET 2 (TWO) TIMES A DAY  30 tablet  4  . nitroGLYCERIN (NITROSTAT) 0.4 MG SL tablet Place 0.4 mg under the tongue every 5 (five) minutes as needed. For chest pain      . NOVOLOG 100 UNIT/ML injection INJECT 3 TIMES A DAY BEFORE MEALS 8-0-10 UNITS  10 mL  5  . pravastatin (PRAVACHOL) 40 MG tablet TAKE ONE TABLET BY MOUTH TWICE DAILY  60 tablet  4   No current facility-administered medications on file prior to visit.    No Known Allergies  No family history on file.  BP 128/70  Pulse 63  Wt 218 lb (98.884 kg)  BMI 31.74 kg/m2  SpO2 98%  Review of Systems denies hypoglycemia    Objective:   Physical Exam VITAL SIGNS:  See vs  page GENERAL: no distress Pulses: dorsalis pedis intact bilat.   Feet: no deformity.  no ulcer on the feet.  feet are of normal color and temp.  1+ bilat leg edema.  onychomycosis of toenails. Neuro: sensation is intact to touch on the feet.  Lab Results  Component Value Date   HGBA1C 8.9* 03/09/2013      Assessment & Plan:  DM: needs increased rx

## 2013-03-09 NOTE — Patient Instructions (Addendum)
blood tests are being requested for you today.  We'll contact you with results.   If this blood test is high, we'll add 5 units of novolog at lunch. check your blood sugar twice a day.  vary the time of day when you check, between before the 3 meals, and at bedtime.  also check if you have symptoms of your blood sugar being too high or too low.  please keep a record of the readings and bring it to your next appointment here.  please call us sooner if your blood sugar goes below 70, or if you have a lot of readings over 200.   Please come back for a follow-up appointment in 3 months.

## 2013-03-11 ENCOUNTER — Telehealth: Payer: Self-pay | Admitting: *Deleted

## 2013-03-11 NOTE — Telephone Encounter (Signed)
Chad Mcmillan, please tell him to take 5 units if he eats dinner, but if he does not eat a whole dinner he can skip the insulin for tonight's dinner.

## 2013-03-11 NOTE — Telephone Encounter (Signed)
Called pt and LVM per Dr Cruzita Lederer for him to take 5 units if he eats dinner, bu if he does not eat a whole dinner then he can skip the insulin for tonight's dinner. If he has any problems or further questions to call our office before 5 pm.

## 2013-03-11 NOTE — Telephone Encounter (Signed)
please call patient: D/c lunch novolog

## 2013-03-11 NOTE — Telephone Encounter (Signed)
Pt called stating that he is taking 15 units in the am of Novolog, 5 at lunch and 10 at dinner. He checked his bg and it was 70. He is feeling weak and is afraid that if he takes 10 at dinner he is going to be in trouble and pass out. He states he does not eat a lot at dinner and is concerned taking the 10 units will be too much. Please advise.

## 2013-03-12 NOTE — Telephone Encounter (Signed)
LVM pt to call if any questions

## 2013-05-03 ENCOUNTER — Other Ambulatory Visit: Payer: Self-pay

## 2013-05-03 MED ORDER — HYDROCHLOROTHIAZIDE 12.5 MG PO CAPS: 12.5000 mg | ORAL_CAPSULE | Freq: Every day | ORAL | Status: DC

## 2013-05-04 ENCOUNTER — Other Ambulatory Visit: Payer: Self-pay | Admitting: *Deleted

## 2013-05-04 MED ORDER — PRAVASTATIN SODIUM 40 MG PO TABS: ORAL_TABLET | ORAL | Status: DC

## 2013-05-14 ENCOUNTER — Other Ambulatory Visit: Payer: Self-pay | Admitting: *Deleted

## 2013-05-14 MED ORDER — PRAVASTATIN SODIUM 40 MG PO TABS: ORAL_TABLET | ORAL | Status: DC

## 2013-05-14 NOTE — Telephone Encounter (Signed)
Refill

## 2013-05-20 ENCOUNTER — Other Ambulatory Visit: Payer: Self-pay | Admitting: *Deleted

## 2013-05-20 MED ORDER — PRAVASTATIN SODIUM 40 MG PO TABS: ORAL_TABLET | ORAL | Status: DC

## 2013-05-26 ENCOUNTER — Other Ambulatory Visit: Payer: Self-pay | Admitting: *Deleted

## 2013-05-26 MED ORDER — PRAVASTATIN SODIUM 40 MG PO TABS: ORAL_TABLET | ORAL | Status: DC

## 2013-05-26 NOTE — Telephone Encounter (Signed)
Pt wants sent to [facility]. Resending rx.

## 2013-05-28 ENCOUNTER — Encounter: Payer: Self-pay | Admitting: Internal Medicine

## 2013-05-28 ENCOUNTER — Ambulatory Visit (INDEPENDENT_AMBULATORY_CARE_PROVIDER_SITE_OTHER): Payer: Medicare Other | Admitting: Internal Medicine

## 2013-05-28 VITALS — BP 140/60 | HR 68 | Ht 69.5 in | Wt 212.0 lb

## 2013-05-28 DIAGNOSIS — E785 Hyperlipidemia, unspecified: Secondary | ICD-10-CM

## 2013-05-28 DIAGNOSIS — I2510 Atherosclerotic heart disease of native coronary artery without angina pectoris: Secondary | ICD-10-CM

## 2013-05-28 NOTE — Patient Instructions (Addendum)
Your physician wants you to follow-up in: 1 year with [name].  You will receive a reminder letter in the mail two months in advance. If you don't receive a letter, please call our office to schedule the follow-up appointment.   NO changes were made to your medications today

## 2013-05-28 NOTE — Progress Notes (Signed)
HPI Patient is an 79 year old with a history of remote Cath. Last cath in 2000. (LAD 50%, 70%, Diag 90% LCX 70 to 80%. LVEF 70. I saw him Last in January 2012. He had a myoview in Fall 2010.  Lpid panel in September 2013 LDL was 67  Since ssen he continues to do well  Limited some by arthritis Denies CP  No SOB.    No Known Allergies  Current Outpatient Prescriptions  Medication Sig Dispense Refill  . amLODipine (NORVASC) 2.5 MG tablet TAKE 1 TABLET BY MOUTH EVERY DAY  30 tablet  11  . aspirin 325 MG tablet Take 325 mg by mouth daily.        Marland Kitchen glucose blood (ONE TOUCH ULTRA TEST) test strip Use as directed to check blood sugar twice daily dx 250.51  200 each  3  . hydrALAZINE (APRESOLINE) 10 MG tablet Take 1 tablet (10 mg total) by mouth 3 (three) times daily.  90 tablet  4  . hydrochlorothiazide (MICROZIDE) 12.5 MG capsule Take 1 capsule (12.5 mg total) by mouth daily.  30 capsule  3  . ibuprofen (ADVIL,MOTRIN) 200 MG tablet Take 100-200 mg by mouth every 6 (six) hours as needed. For pain      . insulin aspart (NOVOLOG) 100 UNIT/ML injection 15 units with breakfast, and 10 units with the evening meal      . insulin NPH (NOVOLIN N RELION) 100 UNIT/ML injection Inject 20 Units into the skin at bedtime.  10 mL  4  . Insulin Syringe-Needle U-100 (B-D INS SYR ULTRAFINE .3CC/30G) 30G X 1/2" 0.3 ML MISC USE AS DIRECTED 4 (FOUR) TIMES DAILY  200 each  4  . isosorbide mononitrate (IMDUR) 30 MG 24 hr tablet Take 1 tablet (30 mg total) by mouth daily.  30 tablet  6  . Lancets (ONETOUCH ULTRASOFT) lancets Use as directed to check blood sugar twice daily dx 250.01  200 each  3  . metoprolol tartrate (LOPRESSOR) 25 MG tablet TAKE 1/2 TABLET 2 (TWO) TIMES A DAY  30 tablet  4  . nitroGLYCERIN (NITROSTAT) 0.4 MG SL tablet Place 0.4 mg under the tongue every 5 (five) minutes as needed. For chest pain      . NOVOLOG 100 UNIT/ML injection INJECT 3 TIMES A DAY BEFORE MEALS 8-0-10 UNITS  10 mL  5  . pravastatin  (PRAVACHOL) 40 MG tablet Take 40 mg by mouth daily.      . furosemide (LASIX) 20 MG tablet Take 20 mg by mouth daily.       No current facility-administered medications for this visit.    Past Medical History  Diagnosis Date  . CORONARY ARTERY DISEASE 06/13/2007  . DIABETES MELLITUS, TYPE I, WITH OPHTHALMIC COMPLICATIONS 9/44/9675  . HYPERLIPIDEMIA 06/13/2007  . HYPERTENSION 06/13/2007  . ADENOCARCINOMA, PROSTATE 06/01/2008  . NEOPLASM, MALIGNANT, SKIN, FACE 06/01/2008  . HYPERKALEMIA 06/01/2008    due to DM neparopathy  . CVA 06/01/2008    x 2  . Other chronic nonalcoholic liver disease 08/03/3845  . RENAL DISEASE 06/01/2008  . DM retinopathy     Past Surgical History  Procedure Laterality Date  . Cataract extraction    . Cholecystectomy    . Hernia repair      right Inguinal hernia  . Esophagogastroduodenoscopy  06/28/1991  . Rest/stress cardiolite  01/22/2001  . Carotid duplex  09/12/2004    No family history on file.  History   Social History  . Marital Status: Married  Spouse Name: N/A    Number of Children: N/A  . Years of Education: N/A   Occupational History  . Retired    Social History Main Topics  . Smoking status: Former Smoker    Quit date: 11/18/1978  . Smokeless tobacco: Not on file  . Alcohol Use: No  . Drug Use: No  . Sexually Active: Not on file   Other Topics Concern  . Not on file   Social History Narrative  . No narrative on file    Review of Systems:  All systems reviewed.  They are negative to the above problem except as previously stated.  Vital Signs: BP 140/60  Pulse 68  Ht 5' 9.5" (1.765 m)  Wt 212 lb (96.163 kg)  BMI 30.87 kg/m2  Physical Exam Patient is in NAD HEENT:  Normocephalic, atraumatic. EOMI, PERRLA.  Neck: JVP is normal.  No bruits.  Lungs: clear to auscultation. No rales no wheezes.  Heart: Regular rate and rhythm. Normal S1, S2. No S3.   No significant murmurs. PMI not displaced.  Abdomen:  Supple, nontender.  Normal bowel sounds. No masses. No hepatomegaly.  Extremities:   Good distal pulses throughout. No lower extremity edema.  Musculoskeletal :moving all extremities.  Neuro:   alert and oriented x3.  CN II-XII grossly intact.   Assessment and Plan:  1.  CAD No symptoms of angina.    2.  HL  Lipids good  WIll have checked im primary care  F/U in 1 year.

## 2013-06-08 ENCOUNTER — Encounter: Payer: Self-pay | Admitting: Endocrinology

## 2013-06-08 ENCOUNTER — Ambulatory Visit (INDEPENDENT_AMBULATORY_CARE_PROVIDER_SITE_OTHER): Payer: Medicare Other | Admitting: Endocrinology

## 2013-06-08 VITALS — BP 140/54 | HR 61 | Temp 97.9°F | Wt 211.6 lb

## 2013-06-08 DIAGNOSIS — E119 Type 2 diabetes mellitus without complications: Secondary | ICD-10-CM

## 2013-06-08 DIAGNOSIS — Z79899 Other long term (current) drug therapy: Secondary | ICD-10-CM

## 2013-06-08 LAB — HEPATIC FUNCTION PANEL
ALT: 38 U/L (ref 0–53)
Total Bilirubin: 0.6 mg/dL (ref 0.3–1.2)

## 2013-06-08 MED ORDER — TERBINAFINE HCL 250 MG PO TABS: 250.0000 mg | ORAL_TABLET | Freq: Every day | ORAL | Status: DC

## 2013-06-08 NOTE — Progress Notes (Signed)
Subjective:    Patient ID: Chad Mcmillan, male    DOB: Mar 19, 1924, 79 y.o.   MRN: [id number]  HPI Pt returns for f/u of insulin-requiring DM (dx'ed 1989; he has mild if any neuropathy of the lower extremities.complicated by renal insuff, CAD, retinopathy, and CVA; he has never had severe hypoglycemia or DKA).  he brings a record of his cbg's which i have reviewed today.  It varies from 72-200.  It is highest in am and highest in the afternoon. pt states he feels well in general.  Pt states few years of moderate thickening of the toenails, but no assoc pain.   Past Medical History  Diagnosis Date  . CORONARY ARTERY DISEASE 06/13/2007  . DIABETES MELLITUS, TYPE I, WITH OPHTHALMIC COMPLICATIONS 123456  . HYPERLIPIDEMIA 06/13/2007  . HYPERTENSION 06/13/2007  . ADENOCARCINOMA, PROSTATE 06/01/2008  . NEOPLASM, MALIGNANT, SKIN, FACE 06/01/2008  . HYPERKALEMIA 06/01/2008    due to DM neparopathy  . CVA 06/01/2008    x 2  . Other chronic nonalcoholic liver disease A999333  . RENAL DISEASE 06/01/2008  . DM retinopathy     Past Surgical History  Procedure Laterality Date  . Cataract extraction    . Cholecystectomy    . Hernia repair      right Inguinal hernia  . Esophagogastroduodenoscopy  06/28/1991  . Rest/stress cardiolite  01/22/2001  . Carotid duplex  09/12/2004    History   Social History  . Marital Status: Married    Spouse Name: N/A    Number of Children: N/A  . Years of Education: N/A   Occupational History  . Retired    Social History Main Topics  . Smoking status: Former Smoker    Quit date: 11/18/1978  . Smokeless tobacco: Not on file  . Alcohol Use: No  . Drug Use: No  . Sexually Active: Not on file   Other Topics Concern  . Not on file   Social History Narrative  . No narrative on file    Current Outpatient Prescriptions on File Prior to Visit  Medication Sig Dispense Refill  . amLODipine (NORVASC) 2.5 MG tablet TAKE 1 TABLET BY MOUTH EVERY DAY  30 tablet   11  . aspirin 325 MG tablet Take 325 mg by mouth daily.        . furosemide (LASIX) 20 MG tablet Take 20 mg by mouth daily.      Marland Kitchen glucose blood (ONE TOUCH ULTRA TEST) test strip Use as directed to check blood sugar twice daily dx 250.51  200 each  3  . hydrALAZINE (APRESOLINE) 10 MG tablet Take 1 tablet (10 mg total) by mouth 3 (three) times daily.  90 tablet  4  . hydrochlorothiazide (MICROZIDE) 12.5 MG capsule Take 1 capsule (12.5 mg total) by mouth daily.  30 capsule  3  . ibuprofen (ADVIL,MOTRIN) 200 MG tablet Take 100-200 mg by mouth every 6 (six) hours as needed. For pain      . insulin aspart (NOVOLOG) 100 UNIT/ML injection 15 units with breakfast, 10 units with lunch, and 10 units with the evening meal      . insulin NPH (NOVOLIN N RELION) 100 UNIT/ML injection Inject 20 Units into the skin at bedtime.  10 mL  4  . Insulin Syringe-Needle U-100 (B-D INS SYR ULTRAFINE .3CC/30G) 30G X 1/2" 0.3 ML MISC USE AS DIRECTED 4 (FOUR) TIMES DAILY  200 each  4  . isosorbide mononitrate (IMDUR) 30 MG 24 hr tablet Take 1 tablet (  30 mg total) by mouth daily.  30 tablet  6  . Lancets (ONETOUCH ULTRASOFT) lancets Use as directed to check blood sugar twice daily dx 250.01  200 each  3  . metoprolol tartrate (LOPRESSOR) 25 MG tablet TAKE 1/2 TABLET 2 (TWO) TIMES A DAY  30 tablet  4  . nitroGLYCERIN (NITROSTAT) 0.4 MG SL tablet Place 0.4 mg under the tongue every 5 (five) minutes as needed. For chest pain      . NOVOLOG 100 UNIT/ML injection INJECT 3 TIMES A DAY BEFORE MEALS 8-0-10 UNITS  10 mL  5  . pravastatin (PRAVACHOL) 40 MG tablet Take 40 mg by mouth daily.       No current facility-administered medications on file prior to visit.    No Known Allergies  History reviewed. No pertinent family history.  BP 140/54  Pulse 61  Temp(Src) 97.9 F (36.6 C) (Oral)  Wt 211 lb 9.6 oz (95.981 kg)  BMI 30.81 kg/m2  SpO2 97%    Review of Systems denies hypoglycemia and weight change    Objective:    Physical Exam VITAL SIGNS:  See vs page GENERAL: no distress    Lab Results  Component Value Date   HGBA1C 8.1* 06/08/2013      Assessment & Plan:  DM: This insulin regimen was chosen from multiple options, as it best matches his insulin to his changing requirements throughout the day.  The benefits of glycemic control must be weighed against the risks of hypoglycemia.  He needs increased rx. Onychomycosis.  He requests rx

## 2013-06-08 NOTE — Patient Instructions (Addendum)
blood tests are being requested for you today.  We'll contact you with results.   Please increase the lunch insulin to 10 units. check your blood sugar twice a day.  vary the time of day when you check, between before the 3 meals, and at bedtime.  also check if you have symptoms of your blood sugar being too high or too low.  please keep a record of the readings and bring it to your next appointment here.  please call us sooner if your blood sugar goes below 70, or if you have a lot of readings over 200.   Please come back for a "medicare wellness" appointment in 3 months.   i have sent a prescription to your pharmacy, for the toenail fungus.

## 2013-06-28 ENCOUNTER — Other Ambulatory Visit: Payer: Self-pay | Admitting: *Deleted

## 2013-06-28 MED ORDER — FUROSEMIDE 20 MG PO TABS: 20.0000 mg | ORAL_TABLET | Freq: Every day | ORAL | Status: DC

## 2013-06-29 ENCOUNTER — Other Ambulatory Visit: Payer: Self-pay

## 2013-06-29 MED ORDER — FUROSEMIDE 20 MG PO TABS: 20.0000 mg | ORAL_TABLET | Freq: Every day | ORAL | Status: DC

## 2013-07-02 ENCOUNTER — Other Ambulatory Visit: Payer: Self-pay

## 2013-07-02 MED ORDER — FUROSEMIDE 20 MG PO TABS: 20.0000 mg | ORAL_TABLET | Freq: Every day | ORAL | Status: DC

## 2013-07-27 ENCOUNTER — Other Ambulatory Visit: Payer: Self-pay

## 2013-07-27 MED ORDER — AMLODIPINE BESYLATE 2.5 MG PO TABS: ORAL_TABLET | ORAL | Status: DC

## 2013-07-27 MED ORDER — METOPROLOL TARTRATE 25 MG PO TABS: ORAL_TABLET | ORAL | Status: DC

## 2013-07-29 ENCOUNTER — Other Ambulatory Visit: Payer: Self-pay | Admitting: *Deleted

## 2013-07-29 MED ORDER — AMLODIPINE BESYLATE 2.5 MG PO TABS: ORAL_TABLET | ORAL | Status: DC

## 2013-07-29 MED ORDER — ISOSORBIDE MONONITRATE ER 30 MG PO TB24: 30.0000 mg | ORAL_TABLET | Freq: Every day | ORAL | Status: DC

## 2013-08-02 ENCOUNTER — Other Ambulatory Visit: Payer: Self-pay

## 2013-08-02 MED ORDER — ISOSORBIDE MONONITRATE ER 30 MG PO TB24: 30.0000 mg | ORAL_TABLET | Freq: Every day | ORAL | Status: DC

## 2013-08-03 ENCOUNTER — Other Ambulatory Visit: Payer: Self-pay

## 2013-08-26 ENCOUNTER — Other Ambulatory Visit: Payer: Self-pay

## 2013-08-26 MED ORDER — HYDROCHLOROTHIAZIDE 12.5 MG PO CAPS: 12.5000 mg | ORAL_CAPSULE | Freq: Every day | ORAL | Status: DC

## 2013-09-08 ENCOUNTER — Ambulatory Visit (INDEPENDENT_AMBULATORY_CARE_PROVIDER_SITE_OTHER): Payer: Medicare Other | Admitting: Endocrinology

## 2013-09-08 ENCOUNTER — Encounter: Payer: Self-pay | Admitting: Endocrinology

## 2013-09-08 VITALS — BP 130/72 | HR 68 | Ht 72.0 in | Wt 212.0 lb

## 2013-09-08 DIAGNOSIS — D61818 Other pancytopenia: Secondary | ICD-10-CM

## 2013-09-08 DIAGNOSIS — Z79899 Other long term (current) drug therapy: Secondary | ICD-10-CM

## 2013-09-08 DIAGNOSIS — N289 Disorder of kidney and ureter, unspecified: Secondary | ICD-10-CM

## 2013-09-08 DIAGNOSIS — I10 Essential (primary) hypertension: Secondary | ICD-10-CM

## 2013-09-08 DIAGNOSIS — E785 Hyperlipidemia, unspecified: Secondary | ICD-10-CM

## 2013-09-08 DIAGNOSIS — E1039 Type 1 diabetes mellitus with other diabetic ophthalmic complication: Secondary | ICD-10-CM

## 2013-09-08 DIAGNOSIS — Z23 Encounter for immunization: Secondary | ICD-10-CM

## 2013-09-08 DIAGNOSIS — D649 Anemia, unspecified: Secondary | ICD-10-CM

## 2013-09-08 DIAGNOSIS — E119 Type 2 diabetes mellitus without complications: Secondary | ICD-10-CM

## 2013-09-08 DIAGNOSIS — K7689 Other specified diseases of liver: Secondary | ICD-10-CM

## 2013-09-08 DIAGNOSIS — C61 Malignant neoplasm of prostate: Secondary | ICD-10-CM

## 2013-09-08 DIAGNOSIS — E875 Hyperkalemia: Secondary | ICD-10-CM

## 2013-09-08 LAB — URINALYSIS, ROUTINE W REFLEX MICROSCOPIC
Hgb urine dipstick: NEGATIVE
Leukocytes, UA: NEGATIVE
Nitrite: NEGATIVE
RBC / HPF: NONE SEEN (ref 0–?)
Specific Gravity, Urine: 1.01 (ref 1.000–1.030)
Total Protein, Urine: NEGATIVE
Urine Glucose: NEGATIVE
Urobilinogen, UA: 0.2 (ref 0.0–1.0)
pH: 6 (ref 5.0–8.0)

## 2013-09-08 LAB — HEMOGLOBIN A1C: Hgb A1c MFr Bld: 7.9 % — ABNORMAL HIGH (ref 4.6–6.5)

## 2013-09-08 LAB — BASIC METABOLIC PANEL
CO2: 28 mEq/L (ref 19–32)
Calcium: 8.9 mg/dL (ref 8.4–10.5)
GFR: 35.15 mL/min — ABNORMAL LOW (ref >60.00)
Potassium: 4.7 mEq/L (ref 3.5–5.1)
Sodium: 137 mEq/L (ref 135–145)

## 2013-09-08 LAB — CBC WITH DIFFERENTIAL/PLATELET
Basophils Absolute: 0 10*3/uL (ref 0.0–0.1)
Hemoglobin: 12.5 g/dL — ABNORMAL LOW (ref 13.0–17.0)
Lymphocytes Relative: 27 % (ref 12.0–46.0)
MCHC: 34.2 g/dL (ref 30.0–36.0)
Monocytes Absolute: 0.4 10*3/uL (ref 0.1–1.0)
Monocytes Relative: 8.2 % (ref 3.0–12.0)
Neutro Abs: 3.3 10*3/uL (ref 1.4–7.7)
Neutrophils Relative %: 62.7 % (ref 43.0–77.0)
RBC: 4.03 Mil/uL — ABNORMAL LOW (ref 4.22–5.81)
RDW: 13.5 % (ref 11.5–14.6)

## 2013-09-08 LAB — LIPID PANEL
Cholesterol: 126 mg/dL (ref 0–200)
Total CHOL/HDL Ratio: 3

## 2013-09-08 LAB — MICROALBUMIN / CREATININE URINE RATIO
Microalb Creat Ratio: 21.4 mg/g (ref 0.0–30.0)
Microalb, Ur: 8.7 mg/dL — ABNORMAL HIGH (ref 0.0–1.9)

## 2013-09-08 LAB — HEPATIC FUNCTION PANEL
Albumin: 3.8 g/dL (ref 3.5–5.2)
Alkaline Phosphatase: 84 U/L (ref 39–117)
Total Bilirubin: 0.6 mg/dL (ref 0.3–1.2)

## 2013-09-08 LAB — PSA: PSA: 0.89 ng/mL (ref 0.10–4.00)

## 2013-09-08 MED ORDER — HYDRALAZINE HCL 10 MG PO TABS: 10.0000 mg | ORAL_TABLET | Freq: Three times a day (TID) | ORAL | Status: DC

## 2013-09-08 NOTE — Progress Notes (Signed)
Subjective:    Patient ID: REYNOLDO Mcmillan, male    DOB: [birth year], 79 y.o.   MRN: [id number]  HPI Pt returns for f/u of insulin-requiring DM (dx'ed 1989, on a routine blood test; he has been on insulin since approx 2004; he has mild if any neuropathy of the lower extremities.complicated by renal insuff, CAD, retinopathy, and CVA; he has never had severe hypoglycemia or DKA).  he brings a record of his cbg's which i have reviewed today.  It varies from 70-160.  There is no trend throughout the day.  pt states he feels well in general.  He denies hypoglycemia.   Past Medical History  Diagnosis Date  . CORONARY ARTERY DISEASE 06/13/2007  . DIABETES MELLITUS, TYPE I, WITH OPHTHALMIC COMPLICATIONS 123456  . HYPERLIPIDEMIA 06/13/2007  . HYPERTENSION 06/13/2007  . ADENOCARCINOMA, PROSTATE 06/01/2008  . NEOPLASM, MALIGNANT, SKIN, FACE 06/01/2008  . HYPERKALEMIA 06/01/2008    due to DM neparopathy  . CVA 06/01/2008    x 2  . Other chronic nonalcoholic liver disease [id number]  . RENAL DISEASE 06/01/2008  . DM retinopathy     Past Surgical History  Procedure Laterality Date  . Cataract extraction    . Cholecystectomy    . Hernia repair      right Inguinal hernia  . Esophagogastroduodenoscopy  06/28/1991  . Rest/stress cardiolite  01/22/2001  . Carotid duplex  09/12/2004    History   Social History  . Marital Status: Married    Spouse Name: N/A    Number of Children: N/A  . Years of Education: N/A   Occupational History  . Retired    Social History Main Topics  . Smoking status: Former Smoker    Quit date: 11/18/1978  . Smokeless tobacco: Not on file  . Alcohol Use: No  . Drug Use: No  . Sexual Activity: Not on file   Other Topics Concern  . Not on file   Social History Narrative  . No narrative on file    Current Outpatient Prescriptions on File Prior to Visit  Medication Sig Dispense Refill  . amLODipine (NORVASC) 2.5 MG tablet TAKE 1 TABLET BY MOUTH EVERY DAY  30 tablet   6  . aspirin 325 MG tablet Take 325 mg by mouth daily.        . furosemide (LASIX) 20 MG tablet Take 1 tablet (20 mg total) by mouth daily.  30 tablet  10  . glucose blood (ONE TOUCH ULTRA TEST) test strip Use as directed to check blood sugar twice daily dx 250.51  200 each  3  . hydrochlorothiazide (MICROZIDE) 12.5 MG capsule Take 1 capsule (12.5 mg total) by mouth daily.  30 capsule  3  . ibuprofen (ADVIL,MOTRIN) 200 MG tablet Take 100-200 mg by mouth every 6 (six) hours as needed. For pain      . insulin aspart (NOVOLOG) 100 UNIT/ML injection 15 units with breakfast, 10 units with lunch, and 10 units with the evening meal      . insulin NPH (NOVOLIN N RELION) 100 UNIT/ML injection Inject 20 Units into the skin at bedtime.  10 mL  4  . Insulin Syringe-Needle U-100 (B-D INS SYR ULTRAFINE .3CC/30G) 30G X 1/2" 0.3 ML MISC USE AS DIRECTED 4 (FOUR) TIMES DAILY  200 each  4  . isosorbide mononitrate (IMDUR) 30 MG 24 hr tablet Take 1 tablet (30 mg total) by mouth daily.  30 tablet  6  . Lancets (ONETOUCH ULTRASOFT) lancets Use as directed  to check blood sugar twice daily dx 250.01  200 each  3  . metoprolol tartrate (LOPRESSOR) 25 MG tablet TAKE 1/2 TABLET 2 (TWO) TIMES A DAY  30 tablet  4  . nitroGLYCERIN (NITROSTAT) 0.4 MG SL tablet Place 0.4 mg under the tongue every 5 (five) minutes as needed. For chest pain      . NOVOLOG 100 UNIT/ML injection INJECT 3 TIMES A DAY BEFORE MEALS 8-0-10 UNITS  10 mL  5  . pravastatin (PRAVACHOL) 40 MG tablet Take 40 mg by mouth daily.      Marland Kitchen terbinafine (LAMISIL) 250 MG tablet Take 1 tablet (250 mg total) by mouth daily.  90 tablet  3   No current facility-administered medications on file prior to visit.    No Known Allergies  No family history on file.  BP 130/72  Pulse 68  Ht 6' (1.829 m)  Wt 212 lb (96.163 kg)  BMI 28.75 kg/m2  SpO2 98%  Review of Systems Denies chest pain and sob    Objective:   Physical Exam VITAL SIGNS:  See vs page GENERAL:  no distress   Lab Results  Component Value Date   WBC 5.2 09/08/2013   HGB 12.5* 09/08/2013   HCT 36.6* 09/08/2013   PLT 116.0* 09/08/2013   GLUCOSE 205* 09/08/2013   CHOL 126 09/08/2013   TRIG 129.0 09/08/2013   HDL 36.20* 09/08/2013   LDLCALC 64 09/08/2013   ALT 29 09/08/2013   AST 34 09/08/2013   NA 137 09/08/2013   K 4.7 09/08/2013   CL 101 09/08/2013   CREATININE 1.9* 09/08/2013   BUN 48* 09/08/2013   CO2 28 09/08/2013   TSH 2.85 09/08/2013   PSA 0.89 09/08/2013   HGBA1C 7.9* 09/08/2013   MICROALBUR 8.7* 09/08/2013   i reviewed electrocardiogram: no change since 2013.      Assessment & Plan:  DM: Due to variable cbg's, we can't increase insulin now. Renal insufficiency: stable.  We'll follow. Anemia: improved.  We'll follow.   Subjective:   Patient here for Medicare annual wellness visit and management of other chronic and acute problems.     Risk factors: advanced age    69 of Physicians Providing Medical Care to Patient:  See "snapshot"   Activities of Daily Living: In your present state of health, do you have any difficulty performing the following activities?:  Preparing food and eating?: No  Bathing yourself: No  Getting dressed: No  Using the toilet:No  Moving around from place to place: No  In the past year have you fallen or had a near fall?: No    Home Safety: Has smoke detector and wears seat belts. Firearms are safely stored. No excess sun exposure.    Diet and Exercise  Current exercise habits:   Dietary issues discussed: pt reports a healthy diet   Depression Screen  Q1: Over the past two weeks, have you felt down, depressed or hopeless? no  Q2: Over the past two weeks, have you felt little interest or pleasure in doing things? no   The following portions of the patient's history were reviewed and updated as appropriate: allergies, current medications, past family history, past medical history, past social history, past surgical  history and problem list.  Past Medical History  Diagnosis Date  . CORONARY ARTERY DISEASE 06/13/2007  . DIABETES MELLITUS, TYPE I, WITH OPHTHALMIC COMPLICATIONS 123456  . HYPERLIPIDEMIA 06/13/2007  . HYPERTENSION 06/13/2007  . ADENOCARCINOMA, PROSTATE 06/01/2008  . NEOPLASM, MALIGNANT, SKIN,  [place] 06/01/2008  . HYPERKALEMIA 06/01/2008    due to DM neparopathy  . CVA 06/01/2008    x 2  . Other chronic nonalcoholic liver disease [id number]  . RENAL DISEASE 06/01/2008  . DM retinopathy     Past Surgical History  Procedure Laterality Date  . Cataract extraction    . Cholecystectomy    . Hernia repair      right Inguinal hernia  . Esophagogastroduodenoscopy  06/28/1991  . Rest/stress cardiolite  01/22/2001  . Carotid duplex  09/12/2004    History   Social History  . Marital Status: Married    Spouse Name: N/A    Number of Children: N/A  . Years of Education: N/A   Occupational History  . Retired    Social History Main Topics  . Smoking status: Former Smoker    Quit date: 11/18/1978  . Smokeless tobacco: Not on file  . Alcohol Use: No  . Drug Use: No  . Sexual Activity: Not on file   Other Topics Concern  . Not on file   Social History Narrative  . No narrative on file    Current Outpatient Prescriptions on File Prior to Visit  Medication Sig Dispense Refill  . amLODipine (NORVASC) 2.5 MG tablet TAKE 1 TABLET BY MOUTH EVERY DAY  30 tablet  6  . aspirin 325 MG tablet Take 325 mg by mouth daily.        . furosemide (LASIX) 20 MG tablet Take 1 tablet (20 mg total) by mouth daily.  30 tablet  10  . glucose blood (ONE TOUCH ULTRA TEST) test strip Use as directed to check blood sugar twice daily dx 250.51  200 each  3  . hydrALAZINE (APRESOLINE) 10 MG tablet Take 1 tablet (10 mg total) by mouth 3 (three) times daily.  90 tablet  4  . hydrochlorothiazide (MICROZIDE) 12.5 MG capsule Take 1 capsule (12.5 mg total) by mouth daily.  30 capsule  3  . ibuprofen (ADVIL,MOTRIN)  200 MG tablet Take 100-200 mg by mouth every 6 (six) hours as needed. For pain      . insulin aspart (NOVOLOG) 100 UNIT/ML injection 15 units with breakfast, 10 units with lunch, and 10 units with the evening meal      . insulin NPH (NOVOLIN N RELION) 100 UNIT/ML injection Inject 20 Units into the skin at bedtime.  10 mL  4  . Insulin Syringe-Needle U-100 (B-D INS SYR ULTRAFINE .3CC/30G) 30G X 1/2" 0.3 ML MISC USE AS DIRECTED 4 (FOUR) TIMES DAILY  200 each  4  . isosorbide mononitrate (IMDUR) 30 MG 24 hr tablet Take 1 tablet (30 mg total) by mouth daily.  30 tablet  6  . Lancets (ONETOUCH ULTRASOFT) lancets Use as directed to check blood sugar twice daily dx 250.01  200 each  3  . metoprolol tartrate (LOPRESSOR) 25 MG tablet TAKE 1/2 TABLET 2 (TWO) TIMES A DAY  30 tablet  4  . nitroGLYCERIN (NITROSTAT) 0.4 MG SL tablet Place 0.4 mg under the tongue every 5 (five) minutes as needed. For chest pain      . NOVOLOG 100 UNIT/ML injection INJECT 3 TIMES A DAY BEFORE MEALS 8-0-10 UNITS  10 mL  5  . pravastatin (PRAVACHOL) 40 MG tablet Take 40 mg by mouth daily.      Marland Kitchen terbinafine (LAMISIL) 250 MG tablet Take 1 tablet (250 mg total) by mouth daily.  90 tablet  3   No current facility-administered medications on  file prior to visit.   No Known Allergies  No family history on file.  BP 130/72  Pulse 68  Ht 6' (1.829 m)  Wt 212 lb (96.163 kg)  BMI 28.75 kg/m2  SpO2 98%  Review of Systems  Denies hearing loss, and visual loss Objective:   Vision:  Sees opthalmologist Hearing: grossly normal Body mass index:  See vs page Msk: pt easily and quickly performs "get-up-and-go" from a sitting position Cognitive Impairment Assessment: cognition, memory and judgment appear normal.  remembers 3/3 at 5 minutes.  excellent recall.  can easily read and write a sentence.  alert and oriented x 3  Assessment:   Medicare wellness utd on preventive parameters    Plan:   During the course of the visit the  patient was educated and counseled about appropriate screening and preventive services including:        Fall prevention   Diabetes screening  Nutrition counseling   Vaccines / LABS Zostavax / Pneumococcal Vaccine  today  PSA  Patient Instructions (the written plan) was given to the patient.   we discussed code status.  pt requests full code, but would not want to be started or maintained on artificial life-support measures if there was not a reasonable chance of recovery

## 2013-09-08 NOTE — Patient Instructions (Addendum)
please consider these measures for your health:  minimize alcohol.  do not use tobacco products.  have a colonoscopy at least every 10 years from age 79.  keep firearms safely stored.  always use seat belts.  have working smoke alarms in your home.  see an eye doctor and dentist regularly.  never drive under the influence of alcohol or drugs (including prescription drugs).  those with fair skin should take precautions against the sun. it is critically important to prevent falling down (keep floor areas well-lit, dry, and free of loose objects.  If you have a cane, walker, or wheelchair, you should use it, even for short trips around the house.  Also, try not to rush). blood tests are being requested for you today.  We'll contact you with results. Please come back for a follow-up appointment in 3 months

## 2013-10-11 ENCOUNTER — Other Ambulatory Visit: Payer: Self-pay | Admitting: *Deleted

## 2013-10-11 MED ORDER — "INSULIN SYRINGE-NEEDLE U-100 30G X 1/2"" 0.3 ML MISC": Status: DC

## 2013-10-11 MED ORDER — GLUCOSE BLOOD VI STRP: ORAL_STRIP | Status: DC

## 2013-10-11 MED ORDER — INSULIN ASPART 100 UNIT/ML ~~LOC~~ SOLN: SUBCUTANEOUS | Status: DC

## 2013-10-11 MED ORDER — ONETOUCH ULTRASOFT LANCETS MISC: Status: DC

## 2013-10-12 ENCOUNTER — Other Ambulatory Visit: Payer: Self-pay | Admitting: *Deleted

## 2013-10-12 MED ORDER — GLUCOSE BLOOD VI STRP: ORAL_STRIP | Status: DC

## 2013-10-12 MED ORDER — ONETOUCH ULTRASOFT LANCETS MISC: Status: DC

## 2013-10-13 ENCOUNTER — Other Ambulatory Visit: Payer: Self-pay | Admitting: *Deleted

## 2013-10-18 ENCOUNTER — Ambulatory Visit (INDEPENDENT_AMBULATORY_CARE_PROVIDER_SITE_OTHER): Payer: Medicare Other | Admitting: Ophthalmology

## 2013-10-18 DIAGNOSIS — I10 Essential (primary) hypertension: Secondary | ICD-10-CM

## 2013-10-18 DIAGNOSIS — E11319 Type 2 diabetes mellitus with unspecified diabetic retinopathy without macular edema: Secondary | ICD-10-CM

## 2013-10-18 DIAGNOSIS — H3530 Unspecified macular degeneration: Secondary | ICD-10-CM

## 2013-10-18 DIAGNOSIS — H43819 Vitreous degeneration, unspecified eye: Secondary | ICD-10-CM

## 2013-10-18 DIAGNOSIS — E1139 Type 2 diabetes mellitus with other diabetic ophthalmic complication: Secondary | ICD-10-CM

## 2013-10-18 DIAGNOSIS — H35039 Hypertensive retinopathy, unspecified eye: Secondary | ICD-10-CM

## 2013-10-26 ENCOUNTER — Other Ambulatory Visit: Payer: Self-pay | Admitting: Endocrinology

## 2013-11-25 ENCOUNTER — Other Ambulatory Visit: Payer: Self-pay | Admitting: Endocrinology

## 2013-12-07 ENCOUNTER — Telehealth: Payer: Self-pay | Admitting: Endocrinology

## 2013-12-07 NOTE — Telephone Encounter (Signed)
Pt informed and he stated that he would be happy not to change medications.

## 2013-12-07 NOTE — Telephone Encounter (Signed)
please call patient: i received letter from insurance. You can buy this for $4 per month at many pharmacies. This may be better than changing meds.  Is this ok with you?

## 2013-12-14 ENCOUNTER — Ambulatory Visit: Payer: Medicare Other | Admitting: Endocrinology

## 2013-12-15 ENCOUNTER — Encounter: Payer: Self-pay | Admitting: Endocrinology

## 2013-12-15 ENCOUNTER — Ambulatory Visit (INDEPENDENT_AMBULATORY_CARE_PROVIDER_SITE_OTHER): Payer: Medicare Other | Admitting: Endocrinology

## 2013-12-15 VITALS — BP 114/60 | HR 58 | Temp 97.8°F | Ht 72.0 in | Wt 213.0 lb

## 2013-12-15 DIAGNOSIS — E119 Type 2 diabetes mellitus without complications: Secondary | ICD-10-CM

## 2013-12-15 DIAGNOSIS — E1039 Type 1 diabetes mellitus with other diabetic ophthalmic complication: Secondary | ICD-10-CM

## 2013-12-15 LAB — HEMOGLOBIN A1C: HEMOGLOBIN A1C: 8.1 % — AB (ref 4.6–6.5)

## 2013-12-15 NOTE — Patient Instructions (Signed)
check your blood sugar twice a day.  vary the time of day when you check, between before the 3 meals, and at bedtime.  also check if you have symptoms of your blood sugar being too high or too low.  please keep a record of the readings and bring it to your next appointment here.  You can write it on any piece of paper.  please call us sooner if your blood sugar goes below 70, or if you have a lot of readings over 200. A diabetes blood tests is requested for you today.  We'll contact you with results. Please come back for a follow-up appointment in 3 months.

## 2013-12-15 NOTE — Progress Notes (Signed)
Subjective:    Patient ID: Chad Mcmillan, male    DOB: 10-Jun-1924, 80 y.o.   MRN: [id number]  HPI Pt returns for f/u of insulin-requiring DM (dx'ed 1989, on a routine blood test; he has been on insulin since approx 2004; he takes multiple daily injections; he has mild if any neuropathy of the lower extremities, but he has associated renal insuff, CAD, retinopathy, and CVA; he has never had severe hypoglycemia or DKA).  he brings a record of his cbg's which i have reviewed today.  It varies from 75-170.  It is lowest in the afternoon.   Past Medical History  Diagnosis Date  . CORONARY ARTERY DISEASE 06/13/2007  . DIABETES MELLITUS, TYPE I, WITH OPHTHALMIC COMPLICATIONS 123456  . HYPERLIPIDEMIA 06/13/2007  . HYPERTENSION 06/13/2007  . ADENOCARCINOMA, PROSTATE 06/01/2008  . NEOPLASM, MALIGNANT, SKIN, FACE 06/01/2008  . HYPERKALEMIA 06/01/2008    due to DM neparopathy  . CVA 06/01/2008    x 2  . Other chronic nonalcoholic liver disease [id number]  . RENAL DISEASE 06/01/2008  . DM retinopathy     Past Surgical History  Procedure Laterality Date  . Cataract extraction    . Cholecystectomy    . Hernia repair      right Inguinal hernia  . Esophagogastroduodenoscopy  06/28/1991  . Rest/stress cardiolite  01/22/2001  . Carotid duplex  09/12/2004    History   Social History  . Marital Status: Married    Spouse Name: N/A    Number of Children: N/A  . Years of Education: N/A   Occupational History  . Retired    Social History Main Topics  . Smoking status: Former Smoker    Quit date: 11/18/1978  . Smokeless tobacco: Not on file  . Alcohol Use: No  . Drug Use: No  . Sexual Activity: Not on file   Other Topics Concern  . Not on file   Social History Narrative  . No narrative on file    Current Outpatient Prescriptions on File Prior to Visit  Medication Sig Dispense Refill  . amLODipine (NORVASC) 2.5 MG tablet TAKE 1 TABLET BY MOUTH EVERY DAY  30 tablet  6  . aspirin 325 MG  tablet Take 325 mg by mouth daily.        . furosemide (LASIX) 20 MG tablet Take 1 tablet (20 mg total) by mouth daily.  30 tablet  10  . glucose blood (ONE TOUCH ULTRA TEST) test strip Use as directed to check blood sugar twice daily Dx code: 250.51  200 each  3  . hydrALAZINE (APRESOLINE) 10 MG tablet Take 1 tablet (10 mg total) by mouth 3 (three) times daily.  90 tablet  11  . hydrochlorothiazide (MICROZIDE) 12.5 MG capsule Take 1 capsule (12.5 mg total) by mouth daily.  30 capsule  3  . ibuprofen (ADVIL,MOTRIN) 200 MG tablet Take 100-200 mg by mouth every 6 (six) hours as needed. For pain      . Insulin Syringe-Needle U-100 (B-D INS SYR ULTRAFINE .3CC/30G) 30G X 1/2" 0.3 ML MISC USE AS DIRECTED 4 (FOUR) TIMES DAILY  300 each  4  . isosorbide mononitrate (IMDUR) 30 MG 24 hr tablet Take 1 tablet (30 mg total) by mouth daily.  30 tablet  6  . Lancets (ONETOUCH ULTRASOFT) lancets Use as directed to check blood sugar twice daily. Dx code: 250.01  200 each  3  . metoprolol tartrate (LOPRESSOR) 25 MG tablet TAKE 1/2 TABLET 2 (TWO) TIMES A  DAY  30 tablet  4  . nitroGLYCERIN (NITROSTAT) 0.4 MG SL tablet Place 0.4 mg under the tongue every 5 (five) minutes as needed. For chest pain      . NOVOLIN N RELION 100 UNIT/ML injection INJECT 20 UNITS INTO THE SKIN AT BEDTIME  10 mL  3  . NOVOLOG 100 UNIT/ML injection INJECT 3 TIMES A DAY BEFORE MEALS 8-0-10 UNITS  10 mL  5  . pravastatin (PRAVACHOL) 40 MG tablet TAKE ONE TABLET BY MOUTH TWICE DAILY.  60 tablet  2  . terbinafine (LAMISIL) 250 MG tablet Take 1 tablet (250 mg total) by mouth daily.  90 tablet  3   No current facility-administered medications on file prior to visit.    No Known Allergies  No family history on file.  BP 114/60  Pulse 58  Temp(Src) 97.8 F (36.6 C) (Oral)  Ht 6' (1.829 m)  Wt 213 lb (96.616 kg)  BMI 28.88 kg/m2  SpO2 97%  Review of Systems denies hypoglycemia and weight change.    Objective:   Physical Exam VITAL  SIGNS:  See vs page GENERAL: no distress Pulses: no carotid bruit NECK: There is no palpable thyroid enlargement.  No thyroid nodule is palpable.  No palpable lymphadenopathy at the anterior neck. LUNGS:  Clear to auscultation. HEART:  Regular rate and rhythm without murmurs noted. Normal S1,S2.    Lab Results  Component Value Date   HGBA1C 8.1* 12/15/2013      Assessment & Plan:  DM:This insulin regimen was chosen from multiple options, as it best matches his insulin to his changing requirements throughout the day.  The benefits of glycemic control must be weighed against the risks of hypoglycemia.  He needs increased rx. Renal insufficiency: stable.  In this setting, he may not need basal insulin.

## 2013-12-26 ENCOUNTER — Other Ambulatory Visit: Payer: Self-pay | Admitting: Endocrinology

## 2013-12-27 ENCOUNTER — Other Ambulatory Visit: Payer: Self-pay

## 2013-12-27 MED ORDER — METOPROLOL TARTRATE 25 MG PO TABS: ORAL_TABLET | ORAL | Status: DC

## 2014-01-23 ENCOUNTER — Other Ambulatory Visit: Payer: Self-pay | Admitting: Endocrinology

## 2014-01-24 ENCOUNTER — Telehealth: Payer: Self-pay

## 2014-01-24 MED ORDER — PRAVASTATIN SODIUM 80 MG PO TABS: 80.0000 mg | ORAL_TABLET | Freq: Every day | ORAL | Status: DC

## 2014-01-24 NOTE — Telephone Encounter (Signed)
Pharmacy called stating that Pravachol is not going to be covered 2 times daily. Pharmacist stated that options are to changed mg and to one time daily or to another statin medication. Pt is fine with whatever Dr agrees to.   Please advise,  Thanks!

## 2014-01-24 NOTE — Telephone Encounter (Signed)
i have changed to 80 mg qd. Please advise patient.

## 2014-01-25 NOTE — Telephone Encounter (Signed)
Pt informed

## 2014-02-16 ENCOUNTER — Telehealth: Payer: Self-pay | Admitting: Endocrinology

## 2014-02-16 MED ORDER — INSULIN ASPART 100 UNIT/ML ~~LOC~~ SOLN: SUBCUTANEOUS | Status: DC

## 2014-02-16 NOTE — Telephone Encounter (Signed)
Pt needs Novolog called in he is runningout already because of his increase in units

## 2014-02-16 NOTE — Telephone Encounter (Signed)
Medication refilled

## 2014-02-23 ENCOUNTER — Other Ambulatory Visit: Payer: Self-pay | Admitting: Endocrinology

## 2014-03-15 ENCOUNTER — Encounter: Payer: Self-pay | Admitting: Endocrinology

## 2014-03-15 ENCOUNTER — Ambulatory Visit (INDEPENDENT_AMBULATORY_CARE_PROVIDER_SITE_OTHER): Payer: Medicare Other | Admitting: Endocrinology

## 2014-03-15 VITALS — BP 116/70 | HR 62 | Temp 97.8°F | Ht 72.0 in | Wt 215.0 lb

## 2014-03-15 DIAGNOSIS — E119 Type 2 diabetes mellitus without complications: Secondary | ICD-10-CM

## 2014-03-15 DIAGNOSIS — E1039 Type 1 diabetes mellitus with other diabetic ophthalmic complication: Secondary | ICD-10-CM

## 2014-03-15 LAB — HEMOGLOBIN A1C: Hgb A1c MFr Bld: 7.1 % — ABNORMAL HIGH (ref 4.6–6.5)

## 2014-03-15 NOTE — Progress Notes (Signed)
Subjective:    Patient ID: Chad Mcmillan, male    DOB: 08/25/1924, 80 y.o.   MRN: [id number]  HPI Pt returns for f/u of insulin-requiring DM (dx'ed 1989, on a routine blood test; he has been on insulin since approx 2004; he takes multiple daily injections; he has mild if any neuropathy of the lower extremities, but he has associated renal insuff, CAD, retinopathy, and CVA; he has never had severe hypoglycemia or DKA).  he brings a record of his cbg's which i have reviewed today.  It varies from 76-169.  On his record, there is at least 1 cbg below 100 at all times of day except HS.   Past Medical History  Diagnosis Date  . CORONARY ARTERY DISEASE 06/13/2007  . DIABETES MELLITUS, TYPE I, WITH OPHTHALMIC COMPLICATIONS 123456  . HYPERLIPIDEMIA 06/13/2007  . HYPERTENSION 06/13/2007  . ADENOCARCINOMA, PROSTATE 06/01/2008  . NEOPLASM, MALIGNANT, SKIN, FACE 06/01/2008  . HYPERKALEMIA 06/01/2008    due to DM neparopathy  . CVA 06/01/2008    x 2  . Other chronic nonalcoholic liver disease [id number]  . RENAL DISEASE 06/01/2008  . DM retinopathy     Past Surgical History  Procedure Laterality Date  . Cataract extraction    . Cholecystectomy    . Hernia repair      right Inguinal hernia  . Esophagogastroduodenoscopy  06/28/1991  . Rest/stress cardiolite  01/22/2001  . Carotid duplex  09/12/2004    History   Social History  . Marital Status: Married    Spouse Name: N/A    Number of Children: N/A  . Years of Education: N/A   Occupational History  . Retired    Social History Main Topics  . Smoking status: Former Smoker    Quit date: 11/18/1978  . Smokeless tobacco: Not on file  . Alcohol Use: No  . Drug Use: No  . Sexual Activity: Not on file   Other Topics Concern  . Not on file   Social History Narrative  . No narrative on file    Current Outpatient Prescriptions on File Prior to Visit  Medication Sig Dispense Refill  . amLODipine (NORVASC) 2.5 MG tablet TAKE 1 TABLET BY  MOUTH EVERY DAY  30 tablet  6  . aspirin 325 MG tablet Take 325 mg by mouth daily.        . furosemide (LASIX) 20 MG tablet Take 1 tablet (20 mg total) by mouth daily.  30 tablet  10  . glucose blood (ONE TOUCH ULTRA TEST) test strip Use as directed to check blood sugar twice daily Dx code: 250.51  200 each  3  . hydrALAZINE (APRESOLINE) 10 MG tablet Take 1 tablet (10 mg total) by mouth 3 (three) times daily.  90 tablet  11  . hydrochlorothiazide (MICROZIDE) 12.5 MG capsule TAKE ONE CAPSULE BY MOUTH ONCE DAILY.  30 capsule  2  . ibuprofen (ADVIL,MOTRIN) 200 MG tablet Take 100-200 mg by mouth every 6 (six) hours as needed. For pain      . insulin aspart (NOVOLOG) 100 UNIT/ML injection 18 units with breakfast, 10 units with lunch, and 13 units with the evening meal  2 vial  2  . Insulin Syringe-Needle U-100 (B-D INS SYR ULTRAFINE .3CC/30G) 30G X 1/2" 0.3 ML MISC USE AS DIRECTED 4 (FOUR) TIMES DAILY  300 each  4  . isosorbide mononitrate (IMDUR) 30 MG 24 hr tablet Take 1 tablet (30 mg total) by mouth daily.  30 tablet  6  .  isosorbide mononitrate (IMDUR) 30 MG 24 hr tablet TAKE ONE TABLET BY MOUTH ONCE DAILY  30 tablet  2  . Lancets (ONETOUCH ULTRASOFT) lancets Use as directed to check blood sugar twice daily. Dx code: 250.01  200 each  3  . metoprolol tartrate (LOPRESSOR) 25 MG tablet TAKE 1/2 TABLET 2 (TWO) TIMES A DAY  30 tablet  4  . nitroGLYCERIN (NITROSTAT) 0.4 MG SL tablet Place 0.4 mg under the tongue every 5 (five) minutes as needed. For chest pain      . NOVOLIN N RELION 100 UNIT/ML injection INJECT 20 UNITS INTO THE SKIN AT BEDTIME  10 mL  3  . NOVOLOG 100 UNIT/ML injection INJECT 3 TIMES A DAY BEFORE MEALS 8-0-10 UNITS  10 mL  5  . pravastatin (PRAVACHOL) 80 MG tablet Take 1 tablet (80 mg total) by mouth daily.  90 tablet  3  . terbinafine (LAMISIL) 250 MG tablet Take 1 tablet (250 mg total) by mouth daily.  90 tablet  3   No current facility-administered medications on file prior to  visit.    No Known Allergies  No family history on file.  BP 116/70  Pulse 62  Temp(Src) 97.8 F (36.6 C) (Oral)  Ht 6' (1.829 m)  Wt 215 lb (97.523 kg)  BMI 29.15 kg/m2  SpO2 97%  Review of Systems He denies hypoglycemia and weight change.    Objective:   Physical Exam VITAL SIGNS:  See vs page GENERAL: no distress.  Lab Results  Component Value Date   HGBA1C 7.1* 03/15/2014      Assessment & Plan:  DM: control is improved.  Renal insufficiency: stable.  In this setting, he may not need basal insulin.

## 2014-03-15 NOTE — Patient Instructions (Addendum)
check your blood sugar twice a day.  vary the time of day when you check, between before the 3 meals, and at bedtime.  also check if you have symptoms of your blood sugar being too high or too low.  please keep a record of the readings and bring it to your next appointment here.  You can write it on any piece of paper.  please call us sooner if your blood sugar goes below 70, or if you have a lot of readings over 200. A diabetes blood test is requested for you today.  We'll contact you with results. Please come back for a follow-up appointment in 3 months.

## 2014-05-25 ENCOUNTER — Other Ambulatory Visit: Payer: Self-pay | Admitting: Endocrinology

## 2014-06-08 ENCOUNTER — Encounter: Payer: Self-pay | Admitting: Endocrinology

## 2014-06-08 ENCOUNTER — Ambulatory Visit (INDEPENDENT_AMBULATORY_CARE_PROVIDER_SITE_OTHER): Payer: Medicare Other | Admitting: Endocrinology

## 2014-06-08 VITALS — BP 120/66 | HR 60 | Temp 98.0°F | Ht 72.0 in | Wt 213.0 lb

## 2014-06-08 DIAGNOSIS — E1039 Type 1 diabetes mellitus with other diabetic ophthalmic complication: Secondary | ICD-10-CM

## 2014-06-08 DIAGNOSIS — N289 Disorder of kidney and ureter, unspecified: Secondary | ICD-10-CM

## 2014-06-08 LAB — BASIC METABOLIC PANEL
BUN: 43 mg/dL — ABNORMAL HIGH (ref 6–23)
CALCIUM: 8.9 mg/dL (ref 8.4–10.5)
CO2: 24 mEq/L (ref 19–32)
CREATININE: 2 mg/dL — AB (ref 0.4–1.5)
Chloride: 101 mEq/L (ref 96–112)
GFR: 34.47 mL/min — ABNORMAL LOW (ref >60.00)
Glucose, Bld: 139 mg/dL — ABNORMAL HIGH (ref 70–99)
Potassium: 4.9 mEq/L (ref 3.5–5.1)
Sodium: 135 mEq/L (ref 135–145)

## 2014-06-08 LAB — HEMOGLOBIN A1C: HEMOGLOBIN A1C: 7.4 % — AB (ref 4.6–6.5)

## 2014-06-08 NOTE — Patient Instructions (Addendum)
check your blood sugar twice a day.  vary the time of day when you check, between before the 3 meals, and at bedtime.  also check if you have symptoms of your blood sugar being too high or too low.  please keep a record of the readings and bring it to your next appointment here.  You can write it on any piece of paper.  please call us sooner if your blood sugar goes below 70, or if you have a lot of readings over 200. A diabetes blood test is requested for you today.  We'll contact you with results.  Please come back for a "medicare wellness" appointment in 3 months (must be after 09/08/14).

## 2014-06-08 NOTE — Progress Notes (Signed)
Subjective:    Patient ID: Chad Mcmillan, male    DOB: [birth year], 80 y.o.   MRN: [id number]  HPI Pt returns for f/u of insulin-requiring DM (dx'ed 1989, on a routine blood test; he has been on insulin since approx 2004; he takes multiple daily injections; he has mild if any neuropathy of the lower extremities, but he has associated renal insuff, CAD, retinopathy, and CVA; he has never had severe hypoglycemia or DKA).  no cbg record, but states cbg's vary from 80-155.  It is highest in the afternoon, and lowest at hs.  pt states he feels well in general. Past Medical History  Diagnosis Date  . CORONARY ARTERY DISEASE 06/13/2007  . DIABETES MELLITUS, TYPE I, WITH OPHTHALMIC COMPLICATIONS 123456  . HYPERLIPIDEMIA 06/13/2007  . HYPERTENSION 06/13/2007  . ADENOCARCINOMA, PROSTATE 06/01/2008  . NEOPLASM, MALIGNANT, SKIN, FACE 06/01/2008  . HYPERKALEMIA 06/01/2008    due to DM neparopathy  . CVA 06/01/2008    x 2  . Other chronic nonalcoholic liver disease [id number]  . RENAL DISEASE 06/01/2008  . DM retinopathy     Past Surgical History  Procedure Laterality Date  . Cataract extraction    . Cholecystectomy    . Hernia repair      right Inguinal hernia  . Esophagogastroduodenoscopy  06/28/1991  . Rest/stress cardiolite  01/22/2001  . Carotid duplex  09/12/2004    History   Social History  . Marital Status: Married    Spouse Name: N/A    Number of Children: N/A  . Years of Education: N/A   Occupational History  . Retired    Social History Main Topics  . Smoking status: Former Smoker    Quit date: 11/18/1978  . Smokeless tobacco: Not on file  . Alcohol Use: No  . Drug Use: No  . Sexual Activity: Not on file   Other Topics Concern  . Not on file   Social History Narrative  . No narrative on file    Current Outpatient Prescriptions on File Prior to Visit  Medication Sig Dispense Refill  . amLODipine (NORVASC) 2.5 MG tablet TAKE 1 TABLET BY MOUTH EVERY DAY  30 tablet  6    . aspirin 325 MG tablet Take 325 mg by mouth daily.        . furosemide (LASIX) 20 MG tablet Take 1 tablet (20 mg total) by mouth daily.  30 tablet  10  . glucose blood (ONE TOUCH ULTRA TEST) test strip Use as directed to check blood sugar twice daily Dx code: 250.51  200 each  3  . hydrALAZINE (APRESOLINE) 10 MG tablet Take 1 tablet (10 mg total) by mouth 3 (three) times daily.  90 tablet  11  . hydrochlorothiazide (MICROZIDE) 12.5 MG capsule TAKE ONE CAPSULE BY MOUTH ONCE DAILY  30 capsule  0  . ibuprofen (ADVIL,MOTRIN) 200 MG tablet Take 100-200 mg by mouth every 6 (six) hours as needed. For pain      . Insulin Syringe-Needle U-100 (B-D INS SYR ULTRAFINE .3CC/30G) 30G X 1/2" 0.3 ML MISC USE AS DIRECTED 4 (FOUR) TIMES DAILY  300 each  4  . isosorbide mononitrate (IMDUR) 30 MG 24 hr tablet Take 1 tablet (30 mg total) by mouth daily.  30 tablet  6  . isosorbide mononitrate (IMDUR) 30 MG 24 hr tablet TAKE ONE TABLET BY MOUTH ONCE DAILY  30 tablet  0  . Lancets (ONETOUCH ULTRASOFT) lancets Use as directed to check blood sugar twice daily.  Dx code: 250.01  200 each  3  . metoprolol tartrate (LOPRESSOR) 25 MG tablet TAKE ONE-HALF TABLET BY MOUTH TWICE DAILY  30 tablet  0  . nitroGLYCERIN (NITROSTAT) 0.4 MG SL tablet Place 0.4 mg under the tongue every 5 (five) minutes as needed. For chest pain      . NOVOLIN N RELION 100 UNIT/ML injection INJECT 20 UNITS INTO THE SKIN AT BEDTIME  10 mL  3  . NOVOLOG 100 UNIT/ML injection INJECT 3 TIMES A DAY BEFORE MEALS 8-0-10 UNITS  10 mL  5  . pravastatin (PRAVACHOL) 80 MG tablet Take 1 tablet (80 mg total) by mouth daily.  90 tablet  3  . terbinafine (LAMISIL) 250 MG tablet Take 1 tablet (250 mg total) by mouth daily.  90 tablet  3   No current facility-administered medications on file prior to visit.    No Known Allergies  No family history on file.  BP 120/66  Pulse 60  Temp(Src) 98 F (36.7 C) (Oral)  Ht 6' (1.829 m)  Wt 213 lb (96.616 kg)  BMI  28.88 kg/m2  SpO2 97%  Review of Systems He denies hypoglycemia and weight change.     Objective:   Physical Exam Pulses: dorsalis pedis intact bilat.  Feet: no deformity. no ulcer on the feet. 1+ bilat leg edema, cyanosis, and varicosities. onychomycosis of toenails. Left great toenail is absent.  Neuro: sensation is intact to touch on the feet.    Lab Results  Component Value Date   HGBA1C 7.4* 06/08/2014   Lab Results  Component Value Date   CREATININE 2.0* 06/08/2014   BUN 43* 06/08/2014   NA 135 06/08/2014   K 4.9 06/08/2014   CL 101 06/08/2014   CO2 24 06/08/2014       Assessment & Plan:  DM: mild exacerbation. Noncompliance with cbg recording: I'll work around this as best I can Renal failure: stable: we'll follow.     Patient is advised the following: Patient Instructions  check your blood sugar twice a day.  vary the time of day when you check, between before the 3 meals, and at bedtime.  also check if you have symptoms of your blood sugar being too high or too low.  please keep a record of the readings and bring it to your next appointment here.  You can write it on any piece of paper.  please call us sooner if your blood sugar goes below 70, or if you have a lot of readings over 200. A diabetes blood test is requested for you today.  We'll contact you with results.  Please come back for a "medicare wellness" appointment in 3 months (must be after 09/08/14).

## 2014-06-23 ENCOUNTER — Other Ambulatory Visit: Payer: Self-pay | Admitting: Endocrinology

## 2014-06-24 ENCOUNTER — Other Ambulatory Visit: Payer: Self-pay | Admitting: *Deleted

## 2014-06-24 MED ORDER — HYDROCHLOROTHIAZIDE 12.5 MG PO CAPS: 12.5000 mg | ORAL_CAPSULE | Freq: Every day | ORAL | Status: DC

## 2014-06-24 MED ORDER — METOPROLOL TARTRATE 25 MG PO TABS: 12.5000 mg | ORAL_TABLET | Freq: Two times a day (BID) | ORAL | Status: DC

## 2014-07-11 ENCOUNTER — Ambulatory Visit (INDEPENDENT_AMBULATORY_CARE_PROVIDER_SITE_OTHER): Payer: Medicare Other | Admitting: Internal Medicine

## 2014-07-11 VITALS — BP 150/60 | HR 57 | Ht 72.0 in | Wt 211.0 lb

## 2014-07-11 DIAGNOSIS — I2510 Atherosclerotic heart disease of native coronary artery without angina pectoris: Secondary | ICD-10-CM

## 2014-07-11 MED ORDER — NITROGLYCERIN 0.4 MG SL SUBL: 0.4000 mg | SUBLINGUAL_TABLET | SUBLINGUAL | Status: DC | PRN

## 2014-07-11 NOTE — Patient Instructions (Signed)
Your physician wants you to follow-up in: 1 YEAR with Dr Harrington Challenger.  You will receive a reminder letter in the mail two months in advance. If you don't receive a letter, please call our office to schedule the follow-up appointment.  Your physician recommends that you continue on your current medications as directed. Please refer to the Current Medication list given to you today.

## 2014-07-11 NOTE — Progress Notes (Signed)
HPI Patient is an 80 year old with a history of remote Cath. Last cath in 2000. (LAD 50%, 70%, Diag 90% LCX 70 to 80%. LVEF 70.  He had a myoview in Fall 2010.  He was last in clinic in April 2014 SInce seen denies dizziness.  No SOB  No CP Walks about 3x per week  1-1.4 miles.  [place]  No Known Allergies  Current Outpatient Prescriptions  Medication Sig Dispense Refill  . amLODipine (NORVASC) 2.5 MG tablet TAKE 1 TABLET BY MOUTH EVERY DAY  30 tablet  6  . aspirin 325 MG tablet Take 325 mg by mouth daily.        . furosemide (LASIX) 20 MG tablet Take 1 tablet (20 mg total) by mouth daily.  30 tablet  10  . glucose blood (ONE TOUCH ULTRA TEST) test strip Use as directed to check blood sugar twice daily Dx code: 250.51  200 each  3  . hydrALAZINE (APRESOLINE) 10 MG tablet Take 1 tablet (10 mg total) by mouth 3 (three) times daily.  90 tablet  11  . hydrochlorothiazide (MICROZIDE) 12.5 MG capsule Take 1 capsule (12.5 mg total) by mouth daily.  30 capsule  2  . ibuprofen (ADVIL,MOTRIN) 200 MG tablet Take 100-200 mg by mouth every 6 (six) hours as needed. For pain      . Insulin Syringe-Needle U-100 (B-D INS SYR ULTRAFINE .3CC/30G) 30G X 1/2" 0.3 ML MISC USE AS DIRECTED 4 (FOUR) TIMES DAILY  300 each  4  . isosorbide mononitrate (IMDUR) 30 MG 24 hr tablet TAKE ONE TABLET BY MOUTH ONCE DAILY  30 tablet  0  . Lancets (ONETOUCH ULTRASOFT) lancets Use as directed to check blood sugar twice daily. Dx code: 250.01  200 each  3  . metoprolol tartrate (LOPRESSOR) 25 MG tablet Take 0.5 tablets (12.5 mg total) by mouth 2 (two) times daily.  30 tablet  2  . nitroGLYCERIN (NITROSTAT) 0.4 MG SL tablet Place 1 tablet (0.4 mg total) under the tongue every 5 (five) minutes as needed. For chest pain  25 tablet  2  . NOVOLIN N RELION 100 UNIT/ML injection INJECT 20 UNITS INTO THE SKIN AT BEDTIME  10 mL  3  . NOVOLOG 100 UNIT/ML injection INJECT 3 TIMES A DAY BEFORE MEALS 8-0-10 UNITS  10 mL  5  . pravastatin  (PRAVACHOL) 80 MG tablet Take 1 tablet (80 mg total) by mouth daily.  90 tablet  3  . terbinafine (LAMISIL) 250 MG tablet Take 1 tablet (250 mg total) by mouth daily.  90 tablet  3   No current facility-administered medications for this visit.    Past Medical History  Diagnosis Date  . CORONARY ARTERY DISEASE 06/13/2007  . DIABETES MELLITUS, TYPE I, WITH OPHTHALMIC COMPLICATIONS 07/16/5620  . HYPERLIPIDEMIA 06/13/2007  . HYPERTENSION 06/13/2007  . ADENOCARCINOMA, PROSTATE 06/01/2008  . NEOPLASM, MALIGNANT, SKIN, FACE 06/01/2008  . HYPERKALEMIA 06/01/2008    due to DM neparopathy  . CVA 06/01/2008    x 2  . Other chronic nonalcoholic liver disease 01/23/6577  . RENAL DISEASE 06/01/2008  . DM retinopathy     Past Surgical History  Procedure Laterality Date  . Cataract extraction    . Cholecystectomy    . Hernia repair      right Inguinal hernia  . Esophagogastroduodenoscopy  06/28/1991  . Rest/stress cardiolite  01/22/2001  . Carotid duplex  09/12/2004    No family history on file.  History   Social  History  . Marital Status: Married    Spouse Name: N/A    Number of Children: N/A  . Years of Education: N/A   Occupational History  . Retired    Social History Main Topics  . Smoking status: Former Smoker    Quit date: 11/18/1978  . Smokeless tobacco: Not on file  . Alcohol Use: No  . Drug Use: No  . Sexual Activity: Not on file   Other Topics Concern  . Not on file   Social History Narrative  . No narrative on file    Review of Systems:  All systems reviewed.  They are negative to the above problem except as previously stated.  Vital Signs: BP 150/60  Pulse 57  Ht 6' (1.829 m)  Wt 211 lb (95.709 kg)  BMI 28.61 kg/m2  Physical Exam Patient is in NAD HEENT:  Normocephalic, atraumatic. EOMI, PERRLA.  Neck: JVP is normal.  No bruits.  Lungs: clear to auscultation. No rales no wheezes.  Heart: Regular rate and rhythm. Normal S1, S2. No S3.   No significant  murmurs. PMI not displaced.  Abdomen:  Supple, nontender. Normal bowel sounds. No masses. No hepatomegaly.  Extremities:   Good distal pulses throughout. No lower extremity edema.  Musculoskeletal :moving all extremities.  Neuro:   alert and oriented x3.  CN II-XII grossly intact.  EKG  SB 57 First degree AV block  PR 320.  LBBB Assessment and Plan:  1.  CAD No symptoms of angina.    2.  HL  LDL in October 64     Encouraged him to stay active F/U in 1 year    F/U in 1 year.

## 2014-07-21 ENCOUNTER — Other Ambulatory Visit: Payer: Self-pay | Admitting: Endocrinology

## 2014-07-22 ENCOUNTER — Other Ambulatory Visit: Payer: Self-pay

## 2014-07-22 MED ORDER — INSULIN ASPART 100 UNIT/ML ~~LOC~~ SOLN: SUBCUTANEOUS | Status: DC

## 2014-07-26 ENCOUNTER — Other Ambulatory Visit: Payer: Self-pay | Admitting: Endocrinology

## 2014-08-23 ENCOUNTER — Other Ambulatory Visit: Payer: Self-pay | Admitting: Endocrinology

## 2014-08-25 ENCOUNTER — Telehealth: Payer: Self-pay | Admitting: *Deleted

## 2014-08-25 NOTE — Telephone Encounter (Signed)
Please refill per protocol.

## 2014-08-26 ENCOUNTER — Other Ambulatory Visit: Payer: Self-pay | Admitting: Endocrinology

## 2014-08-26 MED ORDER — ISOSORBIDE MONONITRATE ER 30 MG PO TB24: ORAL_TABLET | ORAL | Status: DC

## 2014-08-26 MED ORDER — HYDRALAZINE HCL 10 MG PO TABS: 10.0000 mg | ORAL_TABLET | Freq: Three times a day (TID) | ORAL | Status: DC

## 2014-08-26 NOTE — Telephone Encounter (Signed)
Rx sent to pharmacy   

## 2014-09-07 ENCOUNTER — Ambulatory Visit (INDEPENDENT_AMBULATORY_CARE_PROVIDER_SITE_OTHER): Payer: Medicare Other | Admitting: Endocrinology

## 2014-09-07 ENCOUNTER — Encounter: Payer: Self-pay | Admitting: Endocrinology

## 2014-09-07 VITALS — BP 130/52 | HR 61 | Temp 97.7°F | Wt 221.0 lb

## 2014-09-07 DIAGNOSIS — E875 Hyperkalemia: Secondary | ICD-10-CM | POA: Diagnosis not present

## 2014-09-07 DIAGNOSIS — D61818 Other pancytopenia: Secondary | ICD-10-CM

## 2014-09-07 DIAGNOSIS — I10 Essential (primary) hypertension: Secondary | ICD-10-CM | POA: Diagnosis not present

## 2014-09-07 DIAGNOSIS — E785 Hyperlipidemia, unspecified: Secondary | ICD-10-CM

## 2014-09-07 DIAGNOSIS — E1039 Type 1 diabetes mellitus with other diabetic ophthalmic complication: Secondary | ICD-10-CM | POA: Diagnosis not present

## 2014-09-07 DIAGNOSIS — N189 Chronic kidney disease, unspecified: Secondary | ICD-10-CM

## 2014-09-07 DIAGNOSIS — C61 Malignant neoplasm of prostate: Secondary | ICD-10-CM

## 2014-09-07 DIAGNOSIS — I2510 Atherosclerotic heart disease of native coronary artery without angina pectoris: Secondary | ICD-10-CM

## 2014-09-07 DIAGNOSIS — E1122 Type 2 diabetes mellitus with diabetic chronic kidney disease: Secondary | ICD-10-CM

## 2014-09-07 DIAGNOSIS — Z125 Encounter for screening for malignant neoplasm of prostate: Secondary | ICD-10-CM

## 2014-09-07 DIAGNOSIS — K7581 Nonalcoholic steatohepatitis (NASH): Secondary | ICD-10-CM

## 2014-09-07 LAB — URINALYSIS, ROUTINE W REFLEX MICROSCOPIC
Bilirubin Urine: NEGATIVE
Hgb urine dipstick: NEGATIVE
Ketones, ur: NEGATIVE
Leukocytes, UA: NEGATIVE
Nitrite: NEGATIVE
SPECIFIC GRAVITY, URINE: 1.01 (ref 1.000–1.030)
Total Protein, Urine: NEGATIVE
UROBILINOGEN UA: 0.2 (ref 0.0–1.0)
Urine Glucose: NEGATIVE
pH: 5.5 (ref 5.0–8.0)

## 2014-09-07 LAB — TSH: TSH: 3.18 u[IU]/mL (ref 0.35–4.50)

## 2014-09-07 LAB — CBC WITH DIFFERENTIAL/PLATELET
BASOS PCT: 0.4 % (ref 0.0–3.0)
Basophils Absolute: 0 10*3/uL (ref 0.0–0.1)
EOS PCT: 2.2 % (ref 0.0–5.0)
Eosinophils Absolute: 0.1 10*3/uL (ref 0.0–0.7)
HCT: 37.7 % — ABNORMAL LOW (ref 39.0–52.0)
Hemoglobin: 12.3 g/dL — ABNORMAL LOW (ref 13.0–17.0)
Lymphocytes Relative: 25.5 % (ref 12.0–46.0)
Lymphs Abs: 1.3 10*3/uL (ref 0.7–4.0)
MCHC: 32.5 g/dL (ref 30.0–36.0)
MCV: 93.3 fl (ref 78.0–100.0)
MONO ABS: 0.5 10*3/uL (ref 0.1–1.0)
MONOS PCT: 9.9 % (ref 3.0–12.0)
Neutro Abs: 3.1 10*3/uL (ref 1.4–7.7)
Neutrophils Relative %: 62 % (ref 43.0–77.0)
PLATELETS: 105 10*3/uL — AB (ref 150.0–400.0)
RBC: 4.04 Mil/uL — AB (ref 4.22–5.81)
RDW: 14.2 % (ref 11.5–15.5)
WBC: 4.9 10*3/uL (ref 4.0–10.5)

## 2014-09-07 LAB — BASIC METABOLIC PANEL
BUN: 46 mg/dL — AB (ref 6–23)
CALCIUM: 8.7 mg/dL (ref 8.4–10.5)
CHLORIDE: 105 meq/L (ref 96–112)
CO2: 22 mEq/L (ref 19–32)
Creatinine, Ser: 2.1 mg/dL — ABNORMAL HIGH (ref 0.4–1.5)
GFR: 32.53 mL/min — ABNORMAL LOW (ref >60.00)
Glucose, Bld: 86 mg/dL (ref 70–99)
Potassium: 4.7 mEq/L (ref 3.5–5.1)
Sodium: 140 mEq/L (ref 135–145)

## 2014-09-07 LAB — HEPATIC FUNCTION PANEL
ALK PHOS: 86 U/L (ref 39–117)
ALT: 24 U/L (ref 0–53)
AST: 34 U/L (ref 0–37)
Albumin: 3.2 g/dL — ABNORMAL LOW (ref 3.5–5.2)
Bilirubin, Direct: 0.1 mg/dL (ref 0.0–0.3)
TOTAL PROTEIN: 6.7 g/dL (ref 6.0–8.3)
Total Bilirubin: 0.8 mg/dL (ref 0.2–1.2)

## 2014-09-07 LAB — PSA: PSA: 0.92 ng/mL (ref 0.10–4.00)

## 2014-09-07 LAB — LIPID PANEL
Cholesterol: 106 mg/dL (ref 0–200)
HDL: 30.4 mg/dL — ABNORMAL LOW (ref >39.00)
LDL CALC: 49 mg/dL (ref 0–99)
NONHDL: 75.6
TRIGLYCERIDES: 131 mg/dL (ref 0.0–149.0)
Total CHOL/HDL Ratio: 3
VLDL: 26.2 mg/dL (ref 0.0–40.0)

## 2014-09-07 LAB — MICROALBUMIN / CREATININE URINE RATIO
CREATININE, U: 40.6 mg/dL
Microalb Creat Ratio: 11.6 mg/g (ref 0.0–30.0)
Microalb, Ur: 4.7 mg/dL — ABNORMAL HIGH (ref 0.0–1.9)

## 2014-09-07 LAB — IBC PANEL
IRON: 52 ug/dL (ref 42–165)
Saturation Ratios: 15.3 % — ABNORMAL LOW (ref 20.0–50.0)
TRANSFERRIN: 243.1 mg/dL (ref 212.0–360.0)

## 2014-09-07 LAB — HEMOGLOBIN A1C: HEMOGLOBIN A1C: 7 % — AB (ref 4.6–6.5)

## 2014-09-07 MED ORDER — INSULIN ASPART 100 UNIT/ML ~~LOC~~ SOLN: SUBCUTANEOUS | Status: DC

## 2014-09-07 NOTE — Progress Notes (Signed)
Subjective:    Patient ID: Chad Mcmillan, male    DOB: Aug 29, 1924, 80 y.o.   MRN: [id number]  HPI Pt returns for f/u of diabetes mellitus: DM type: Insulin-requiring type 2 Dx'ed: 123XX123 Complications: renal insuff, CAD, retinopathy, and CVA Therapy: insulin since 2004 DKA: never Severe hypoglycemia: never Pancreatitis: never Other: he takes multiple daily injections Interval history: He denies hypoglycemia.  no cbg record, but states cbg's vary from 65 (afternoon) up to 148 (other times). Past Medical History  Diagnosis Date  . CORONARY ARTERY DISEASE 06/13/2007  . DIABETES MELLITUS, TYPE I, WITH OPHTHALMIC COMPLICATIONS 123456  . HYPERLIPIDEMIA 06/13/2007  . HYPERTENSION 06/13/2007  . ADENOCARCINOMA, PROSTATE 06/01/2008  . NEOPLASM, MALIGNANT, SKIN, FACE 06/01/2008  . HYPERKALEMIA 06/01/2008    due to DM neparopathy  . CVA 06/01/2008    x 2  . Other chronic nonalcoholic liver disease [id number]  . RENAL DISEASE 06/01/2008  . DM retinopathy     Past Surgical History  Procedure Laterality Date  . Cataract extraction    . Cholecystectomy    . Hernia repair      right Inguinal hernia  . Esophagogastroduodenoscopy  06/28/1991  . Rest/stress cardiolite  01/22/2001  . Carotid duplex  09/12/2004    History   Social History  . Marital Status: Married    Spouse Name: N/A    Number of Children: N/A  . Years of Education: N/A   Occupational History  . Retired    Social History Main Topics  . Smoking status: Former Smoker    Quit date: 11/18/1978  . Smokeless tobacco: Not on file  . Alcohol Use: No  . Drug Use: No  . Sexual Activity: Not on file   Other Topics Concern  . Not on file   Social History Narrative  . No narrative on file    Current Outpatient Prescriptions on File Prior to Visit  Medication Sig Dispense Refill  . amLODipine (NORVASC) 2.5 MG tablet TAKE ONE TABLET BY MOUTH ONCE DAILY  30 tablet  0  . aspirin 325 MG tablet Take 325 mg by mouth daily.         . furosemide (LASIX) 20 MG tablet TAKE ONE TABLET BY MOUTH ONCE DAILY  30 tablet  0  . glucose blood (ONE TOUCH ULTRA TEST) test strip Use as directed to check blood sugar twice daily Dx code: 250.51  200 each  3  . hydrALAZINE (APRESOLINE) 10 MG tablet Take 1 tablet (10 mg total) by mouth 3 (three) times daily.  270 tablet  1  . hydrochlorothiazide (MICROZIDE) 12.5 MG capsule TAKE ONE CAPSULE BY MOUTH ONCE DAILY  30 capsule  0  . ibuprofen (ADVIL,MOTRIN) 200 MG tablet Take 100-200 mg by mouth every 6 (six) hours as needed. For pain      . Insulin Syringe-Needle U-100 (B-D INS SYR ULTRAFINE .3CC/30G) 30G X 1/2" 0.3 ML MISC USE AS DIRECTED 4 (FOUR) TIMES DAILY  300 each  4  . isosorbide mononitrate (IMDUR) 30 MG 24 hr tablet TAKE ONE TABLET BY MOUTH ONCE DAILY  90 tablet  1  . Lancets (ONETOUCH ULTRASOFT) lancets Use as directed to check blood sugar twice daily. Dx code: 250.01  200 each  3  . metoprolol tartrate (LOPRESSOR) 25 MG tablet Take 0.5 tablets (12.5 mg total) by mouth 2 (two) times daily.  30 tablet  2  . nitroGLYCERIN (NITROSTAT) 0.4 MG SL tablet Place 1 tablet (0.4 mg total) under the tongue every 5 (  five) minutes as needed. For chest pain  25 tablet  2  . NOVOLIN N RELION 100 UNIT/ML injection INJECT 20 UNITS INTO THE SKIN AT BEDTIME  10 mL  2  . pravastatin (PRAVACHOL) 80 MG tablet Take 1 tablet (80 mg total) by mouth daily.  90 tablet  3  . terbinafine (LAMISIL) 250 MG tablet Take 1 tablet (250 mg total) by mouth daily.  90 tablet  3   No current facility-administered medications on file prior to visit.    No Known Allergies  No family history on file.  BP 130/52  Pulse 61  Temp(Src) 97.7 F (36.5 C) (Oral)  Wt 221 lb (100.245 kg)  SpO2 97%  Review of Systems Denies weight change and sob    Objective:   Physical Exam VITAL SIGNS:  See vs page GENERAL: no distress Pulses: dorsalis pedis intact bilat.  Feet: no deformity. no ulcer on the feet. 1+ bilat leg edema,  cyanosis, and varicosities. onychomycosis of toenails. Left great toenail is absent.  Neuro: sensation is intact to touch on the feet, but decreased from normal.   Lab Results  Component Value Date   WBC 4.9 09/07/2014   HGB 12.3* 09/07/2014   HCT 37.7* 09/07/2014   PLT 105.0* 09/07/2014   GLUCOSE 86 09/07/2014   CHOL 106 09/07/2014   TRIG 131.0 09/07/2014   HDL 30.40* 09/07/2014   LDLCALC 49 09/07/2014   ALT 24 09/07/2014   AST 34 09/07/2014   NA 140 09/07/2014   K 4.7 09/07/2014   CL 105 09/07/2014   CREATININE 2.1* 09/07/2014   BUN 46* 09/07/2014   CO2 22 09/07/2014   TSH 3.18 09/07/2014   PSA 0.92 09/07/2014   HGBA1C 7.0* 09/07/2014   MICROALBUR 4.7* 09/07/2014       Assessment & Plan:  DM: slightly overcontrolled, given his advanced age. Pancytopenia, stable Renal insuff, stable.  Patient is advised the following: Patient Instructions  Please come back for a "medicare wellness" appointment in 3 months. blood and urine tests are being requested for you today.  We'll contact you with results. check your blood sugar twice a day.  vary the time of day when you check, between before the 3 meals, and at bedtime.  also check if you have symptoms of your blood sugar being too high or too low.  please keep a record of the readings and bring it to your next appointment here.  You can write it on any piece of paper.  please call us sooner if your blood sugar goes below 70, or if you have a lot of readings over 200.  Please reduce the lunch novolog to 8 units.

## 2014-09-07 NOTE — Patient Instructions (Addendum)
Please come back for a "medicare wellness" appointment in 3 months. blood and urine tests are being requested for you today.  We'll contact you with results. check your blood sugar twice a day.  vary the time of day when you check, between before the 3 meals, and at bedtime.  also check if you have symptoms of your blood sugar being too high or too low.  please keep a record of the readings and bring it to your next appointment here.  You can write it on any piece of paper.  please call us sooner if your blood sugar goes below 70, or if you have a lot of readings over 200.  Please reduce the lunch novolog to 8 units.

## 2014-09-08 LAB — PTH, INTACT AND CALCIUM
CALCIUM: 8.8 mg/dL (ref 8.4–10.5)
PTH: 96 pg/mL — ABNORMAL HIGH (ref 14–64)

## 2014-09-25 ENCOUNTER — Other Ambulatory Visit: Payer: Self-pay | Admitting: Endocrinology

## 2014-10-24 ENCOUNTER — Other Ambulatory Visit: Payer: Self-pay | Admitting: Endocrinology

## 2014-11-04 ENCOUNTER — Telehealth: Payer: Self-pay | Admitting: Endocrinology

## 2014-11-04 ENCOUNTER — Encounter (HOSPITAL_COMMUNITY): Payer: Self-pay | Admitting: *Deleted

## 2014-11-04 ENCOUNTER — Emergency Department (INDEPENDENT_AMBULATORY_CARE_PROVIDER_SITE_OTHER)
Admission: EM | Admit: 2014-11-04 | Discharge: 2014-11-04 | Disposition: A | Discharge status: Home / Self Care | Payer: Medicare Other | Source: Home / Self Care | Attending: Family Medicine | Admitting: Family Medicine

## 2014-11-04 DIAGNOSIS — K529 Noninfective gastroenteritis and colitis, unspecified: Secondary | ICD-10-CM

## 2014-11-04 MED ORDER — PROMETHAZINE HCL 12.5 MG PO TABS: 6.2500 mg | ORAL_TABLET | Freq: Four times a day (QID) | ORAL | Status: DC | PRN

## 2014-11-04 NOTE — Discharge Instructions (Signed)
Try Imodium, an over the counter diarrhea medicine. Follow the instructions on the package.    Food Choices to Help Relieve Diarrhea When you have diarrhea, the foods you eat and your eating habits are very important. Choosing the right foods and drinks can help relieve diarrhea. Also, because diarrhea can last up to 7 days, you need to replace lost fluids and electrolytes (such as sodium, potassium, and chloride) in order to help prevent dehydration.  WHAT GENERAL GUIDELINES DO I NEED TO FOLLOW?  Slowly drink 1 cup (8 oz) of fluid for each episode of diarrhea. If you are getting enough fluid, your urine will be clear or pale yellow.  Eat starchy foods. Some good choices include white rice, white toast, pasta, low-fiber cereal, baked potatoes (without the skin), saltine crackers, and bagels.  Avoid large servings of any cooked vegetables.  Limit fruit to two servings per day. A serving is  cup or 1 small piece.  Choose foods with less than 2 g of fiber per serving.  Limit fats to less than 8 tsp (38 g) per day.  Avoid fried foods.  Eat foods that have probiotics in them. Probiotics can be found in certain dairy products.  Avoid foods and beverages that may increase the speed at which food moves through the stomach and intestines (gastrointestinal tract). Things to avoid include:  High-fiber foods, such as dried fruit, raw fruits and vegetables, nuts, seeds, and whole grain foods.  Spicy foods and high-fat foods.  Foods and beverages sweetened with high-fructose corn syrup, honey, or sugar alcohols such as xylitol, sorbitol, and mannitol. WHAT FOODS ARE RECOMMENDED? Grains White rice. White, [place], or pita breads (fresh or toasted), including plain rolls, buns, or bagels. White pasta. Saltine, soda, or graham crackers. Pretzels. Low-fiber cereal. Cooked cereals made with water (such as cornmeal, farina, or cream cereals). Plain muffins. Matzo. Melba toast. Zwieback.   Vegetables Potatoes (without the skin). Strained tomato and vegetable juices. Most well-cooked and canned vegetables without seeds. Tender lettuce. Fruits Cooked or canned applesauce, apricots, cherries, fruit cocktail, grapefruit, peaches, pears, or plums. Fresh bananas, apples without skin, cherries, grapes, cantaloupe, grapefruit, peaches, oranges, or plums.  Meat and Other Protein Products Baked or boiled chicken. Eggs. Tofu. Fish. Seafood. Smooth peanut butter. Ground or well-cooked tender beef, ham, veal, lamb, pork, or poultry.  Dairy Plain yogurt, kefir, and unsweetened liquid yogurt. Lactose-free milk, buttermilk, or soy milk. Plain hard cheese. Beverages Sport drinks. Clear broths. Diluted fruit juices (except prune). Regular, caffeine-free sodas such as ginger ale. Water. Decaffeinated teas. Oral rehydration solutions. Sugar-free beverages not sweetened with sugar alcohols. Other Bouillon, broth, or soups made from recommended foods.  The items listed above may not be a complete list of recommended foods or beverages. Contact your dietitian for more options. WHAT FOODS ARE NOT RECOMMENDED? Grains Whole grain, whole wheat, bran, or rye breads, rolls, pastas, crackers, and cereals. Wild or brown rice. Cereals that contain more than 2 g of fiber per serving. Corn tortillas or taco shells. Cooked or dry oatmeal. Granola. Popcorn. Vegetables Raw vegetables. Cabbage, broccoli, Brussels sprouts, artichokes, baked beans, beet greens, corn, kale, legumes, peas, sweet potatoes, and yams. Potato skins. Cooked spinach and cabbage. Fruits Dried fruit, including raisins and dates. Raw fruits. Stewed or dried prunes. Fresh apples with skin, apricots, mangoes, pears, raspberries, and strawberries.  Meat and Other Protein Products Chunky peanut butter. Nuts and seeds. Beans and lentils. Berniece Salines.  Dairy High-fat cheeses. Milk, chocolate milk, and beverages made with milk,  such as milk shakes. Cream.  Ice cream. Sweets and Desserts Sweet rolls, doughnuts, and sweet breads. Pancakes and waffles. Fats and Oils Butter. Cream sauces. Margarine. Salad oils. Plain salad dressings. Olives. Avocados.  Beverages Caffeinated beverages (such as coffee, tea, soda, or energy drinks). Alcoholic beverages. Fruit juices with pulp. Prune juice. Soft drinks sweetened with high-fructose corn syrup or sugar alcohols. Other Coconut. Hot sauce. Chili powder. Mayonnaise. Gravy. Cream-based or milk-based soups.  The items listed above may not be a complete list of foods and beverages to avoid. Contact your dietitian for more information. WHAT SHOULD I DO IF I BECOME DEHYDRATED? Diarrhea can sometimes lead to dehydration. Signs of dehydration include dark urine and dry mouth and skin. If you think you are dehydrated, you should rehydrate with an oral rehydration solution. These solutions can be purchased at pharmacies, retail stores, or online.  Drink -1 cup (120-240 mL) of oral rehydration solution each time you have an episode of diarrhea. If drinking this amount makes your diarrhea worse, try drinking smaller amounts more often. For example, drink 1-3 tsp (5-15 mL) every 5-10 minutes.  A general rule for staying hydrated is to drink 1-2 L of fluid per day. Talk to your health care provider about the specific amount you should be drinking each day. Drink enough fluids to keep your urine clear or pale yellow. Document Released: 01/25/2004 Document Revised: 11/09/2013 Document Reviewed: 09/27/2013 [facility] Patient Information 2015 [place], [place]. This information is not intended to replace advice given to you by your health care provider. Make sure you discuss any questions you have with your health care provider.

## 2014-11-04 NOTE — ED Provider Notes (Signed)
CSN: [id number]     Arrival date & time 11/04/14  1101 History   First MD Initiated Contact with Patient 11/04/14 1217     Chief Complaint  Patient presents with  . Diarrhea   (Consider location/radiation/quality/duration/timing/severity/associated sxs/prior Treatment) HPI Comments: Pt here with wife who has similar sx. Onset last night. Emesis x2. Able to keep down coffee and cereal this morning but still feels nauseated.   Patient is a 80 y.o. male presenting with diarrhea. The history is provided by the patient.  Diarrhea Quality:  Watery Severity:  Moderate Onset quality:  Sudden Number of episodes:  7 Duration:  1 day Timing:  Intermittent Progression:  Unchanged Relieved by:  None tried Worsened by:  Nothing tried Ineffective treatments:  None tried Associated symptoms: vomiting   Associated symptoms: no abdominal pain, no chills and no fever     Past Medical History  Diagnosis Date  . CORONARY ARTERY DISEASE 06/13/2007  . DIABETES MELLITUS, TYPE I, WITH OPHTHALMIC COMPLICATIONS 123456  . HYPERLIPIDEMIA 06/13/2007  . HYPERTENSION 06/13/2007  . ADENOCARCINOMA, PROSTATE 06/01/2008  . NEOPLASM, MALIGNANT, SKIN, FACE 06/01/2008  . HYPERKALEMIA 06/01/2008    due to DM neparopathy  . CVA 06/01/2008    x 2  . Other chronic nonalcoholic liver disease [id number]  . RENAL DISEASE 06/01/2008  . DM retinopathy    Past Surgical History  Procedure Laterality Date  . Cataract extraction    . Cholecystectomy    . Hernia repair      right Inguinal hernia  . Esophagogastroduodenoscopy  06/28/1991  . Rest/stress cardiolite  01/22/2001  . Carotid duplex  09/12/2004   History reviewed. No pertinent family history. History  Substance Use Topics  . Smoking status: Former Smoker    Quit date: 11/18/1978  . Smokeless tobacco: Not on file  . Alcohol Use: No    Review of Systems  Constitutional: Negative for fever and chills.  Gastrointestinal: Positive for nausea, vomiting and  diarrhea. Negative for abdominal pain.    Allergies  Review of patient's allergies indicates no known allergies.  Home Medications   Prior to Admission medications   Medication Sig Start Date End Date Taking? Authorizing Provider  amLODipine (NORVASC) 2.5 MG tablet TAKE ONE TABLET BY MOUTH ONCE DAILY. 10/24/14  Yes [name], MD  aspirin 325 MG tablet Take 325 mg by mouth daily.     Yes Historical Provider, MD  furosemide (LASIX) 20 MG tablet TAKE ONE TABLET BY MOUTH ONCE DAILY 10/24/14  Yes [name], MD  glucose blood (ONE TOUCH ULTRA TEST) test strip Use as directed to check blood sugar twice daily Dx code: 250.51 10/12/13  Yes [name], MD  hydrALAZINE (APRESOLINE) 10 MG tablet Take 1 tablet (10 mg total) by mouth 3 (three) times daily. 08/26/14  Yes [name], MD  hydrochlorothiazide (MICROZIDE) 12.5 MG capsule TAKE ONE CAPSULE BY MOUTH ONCE DAILY 10/24/14  Yes [name], MD  insulin aspart (NOVOLOG) 100 UNIT/ML injection INJECT 18 UNITS UNDER THE SKIN WITH BREAKFAST, 8 UNITS WITH LUNCH, AND 13 UNITS WITH THE EVENING MEAL 09/07/14  Yes [name], MD  isosorbide mononitrate (IMDUR) 30 MG 24 hr tablet TAKE ONE TABLET BY MOUTH ONCE DAILY 08/26/14  Yes [name], MD  Lancets [facility] ULTRASOFT) lancets Use as directed to check blood sugar twice daily. Dx code: 250.01 10/12/13  Yes [name], MD  metoprolol tartrate (LOPRESSOR) 25 MG tablet TAKE 1/2 TABLET BY MOUTH TWICE DAILY 10/24/14  Yes [name], MD  NOVOLIN N RELION 100 UNIT/ML injection INJECT 20 UNITS INTO THE SKIN AT BEDTIME 08/23/14  Yes [name], MD  pravastatin (PRAVACHOL) 80 MG tablet Take 1 tablet (80 mg total) by mouth daily. 01/24/14  Yes [name], MD  ibuprofen (ADVIL,MOTRIN) 200 MG tablet Take 100-200 mg by mouth every 6 (six) hours as needed. For pain    Historical Provider, MD  Insulin Syringe-Needle U-100 (B-D INS SYR ULTRAFINE .3CC/30G) 30G X 1/2" 0.3 ML MISC USE AS DIRECTED 4 (FOUR) TIMES  DAILY 10/11/13   [name], MD  nitroGLYCERIN (NITROSTAT) 0.4 MG SL tablet Place 1 tablet (0.4 mg total) under the tongue every 5 (five) minutes as needed. For chest pain 07/11/14   [name], MD  promethazine (PHENERGAN) 12.5 MG tablet Take 0.5 tablets (6.25 mg total) by mouth every 6 (six) hours as needed for nausea or vomiting. 11/04/14   Carvel Getting, NP   BP 134/58 mmHg  Pulse 86  Temp(Src) 97.4 F (36.3 C) (Oral)  Resp 16  SpO2 97% Physical Exam  Constitutional: He appears well-developed and well-nourished. No distress.  Cardiovascular: Normal rate and regular rhythm.   Pulmonary/Chest: Effort normal and breath sounds normal.  Abdominal: Soft. Normal appearance. He exhibits distension. Bowel sounds are increased. There is no tenderness.    ED Course  Procedures (including critical care time) Labs Review Labs Reviewed - No data to display  Imaging Review No results found.   MDM   1. Gastroenteritis    Rx phenergan 12.5mg ; 1/2 q6hrs prn nausea #14. Suggested imodium. Gave diet instructions.     Carvel Getting, NP 11/04/14 [phone]

## 2014-11-04 NOTE — ED Notes (Addendum)
Onset last night diarrhea--he has had 7 watery stools since then with nausea and vomiting this AM  He denies fever  He has had 2 cups of coffee and water today and cereal.

## 2014-11-14 ENCOUNTER — Ambulatory Visit (INDEPENDENT_AMBULATORY_CARE_PROVIDER_SITE_OTHER): Payer: Medicare Other | Admitting: Ophthalmology

## 2014-11-14 DIAGNOSIS — H43813 Vitreous degeneration, bilateral: Secondary | ICD-10-CM

## 2014-11-14 DIAGNOSIS — E11339 Type 2 diabetes mellitus with moderate nonproliferative diabetic retinopathy without macular edema: Secondary | ICD-10-CM

## 2014-11-14 DIAGNOSIS — H3531 Nonexudative age-related macular degeneration: Secondary | ICD-10-CM

## 2014-11-14 DIAGNOSIS — E11331 Type 2 diabetes mellitus with moderate nonproliferative diabetic retinopathy with macular edema: Secondary | ICD-10-CM

## 2014-11-14 DIAGNOSIS — H35033 Hypertensive retinopathy, bilateral: Secondary | ICD-10-CM

## 2014-11-14 DIAGNOSIS — E11311 Type 2 diabetes mellitus with unspecified diabetic retinopathy with macular edema: Secondary | ICD-10-CM

## 2014-11-14 DIAGNOSIS — I10 Essential (primary) hypertension: Secondary | ICD-10-CM

## 2014-11-21 ENCOUNTER — Other Ambulatory Visit: Payer: Self-pay | Admitting: Endocrinology

## 2014-11-23 ENCOUNTER — Other Ambulatory Visit: Payer: Self-pay | Admitting: Endocrinology

## 2014-11-24 ENCOUNTER — Telehealth: Payer: Self-pay

## 2014-11-24 MED ORDER — INSULIN NPH (HUMAN) (ISOPHANE) 100 UNIT/ML ~~LOC~~ SUSP: SUBCUTANEOUS | Status: DC

## 2014-11-24 NOTE — Telephone Encounter (Signed)
Pt's wife advised of note below. Pt will call back to discuss once he returns home.

## 2014-11-24 NOTE — Telephone Encounter (Signed)
ok 

## 2014-11-24 NOTE — Telephone Encounter (Signed)
Received a notice from pt's pharmacy stating that the Novolin N is no longer cover by her insurance. Humulin is now the preferred.  Ok to change? Thanks!

## 2014-12-07 ENCOUNTER — Encounter: Payer: Self-pay | Admitting: Endocrinology

## 2014-12-07 ENCOUNTER — Ambulatory Visit (INDEPENDENT_AMBULATORY_CARE_PROVIDER_SITE_OTHER): Payer: Medicare Other | Admitting: Endocrinology

## 2014-12-07 VITALS — BP 128/54 | HR 64 | Temp 97.9°F | Wt 213.0 lb

## 2014-12-07 DIAGNOSIS — D649 Anemia, unspecified: Secondary | ICD-10-CM

## 2014-12-07 DIAGNOSIS — E1122 Type 2 diabetes mellitus with diabetic chronic kidney disease: Secondary | ICD-10-CM

## 2014-12-07 DIAGNOSIS — I10 Essential (primary) hypertension: Secondary | ICD-10-CM

## 2014-12-07 DIAGNOSIS — Z23 Encounter for immunization: Secondary | ICD-10-CM

## 2014-12-07 DIAGNOSIS — N189 Chronic kidney disease, unspecified: Secondary | ICD-10-CM

## 2014-12-07 DIAGNOSIS — E1039 Type 1 diabetes mellitus with other diabetic ophthalmic complication: Secondary | ICD-10-CM

## 2014-12-07 DIAGNOSIS — N289 Disorder of kidney and ureter, unspecified: Secondary | ICD-10-CM

## 2014-12-07 DIAGNOSIS — Z0000 Encounter for general adult medical examination without abnormal findings: Secondary | ICD-10-CM

## 2014-12-07 LAB — CBC WITH DIFFERENTIAL/PLATELET
Basophils Absolute: 0 10*3/uL (ref 0.0–0.1)
Basophils Relative: 0.4 % (ref 0.0–3.0)
EOS ABS: 0.1 10*3/uL (ref 0.0–0.7)
Eosinophils Relative: 2 % (ref 0.0–5.0)
HEMATOCRIT: 35.1 % — AB (ref 39.0–52.0)
Hemoglobin: 12 g/dL — ABNORMAL LOW (ref 13.0–17.0)
LYMPHS ABS: 1.4 10*3/uL (ref 0.7–4.0)
Lymphocytes Relative: 26.7 % (ref 12.0–46.0)
MCHC: 34.1 g/dL (ref 30.0–36.0)
MCV: 90.3 fl (ref 78.0–100.0)
Monocytes Absolute: 0.5 10*3/uL (ref 0.1–1.0)
Monocytes Relative: 8.6 % (ref 3.0–12.0)
NEUTROS PCT: 62.3 % (ref 43.0–77.0)
Neutro Abs: 3.4 10*3/uL (ref 1.4–7.7)
Platelets: 115 10*3/uL — ABNORMAL LOW (ref 150.0–400.0)
RBC: 3.89 Mil/uL — ABNORMAL LOW (ref 4.22–5.81)
RDW: 14.2 % (ref 11.5–15.5)
WBC: 5.4 10*3/uL (ref 4.0–10.5)

## 2014-12-07 LAB — BASIC METABOLIC PANEL
BUN: 51 mg/dL — AB (ref 6–23)
CHLORIDE: 101 meq/L (ref 96–112)
CO2: 30 meq/L (ref 19–32)
CREATININE: 2.03 mg/dL — AB (ref 0.40–1.50)
Calcium: 8.7 mg/dL (ref 8.4–10.5)
GFR: 33.06 mL/min — ABNORMAL LOW (ref >60.00)
Glucose, Bld: 200 mg/dL — ABNORMAL HIGH (ref 70–99)
Potassium: 4.9 mEq/L (ref 3.5–5.1)
Sodium: 134 mEq/L — ABNORMAL LOW (ref 135–145)

## 2014-12-07 LAB — TSH: TSH: 3.3 u[IU]/mL (ref 0.35–4.50)

## 2014-12-07 LAB — HEMOGLOBIN A1C: HEMOGLOBIN A1C: 7.1 % — AB (ref 4.6–6.5)

## 2014-12-07 NOTE — Patient Instructions (Addendum)
Please come back for a follow-up appointment in 3 months.   blood tests are requested for you today.  We'll contact you with results. Please reduce the lunch insulin to 5 units. check your blood sugar twice a day.  vary the time of day when you check, between before the 3 meals, and at bedtime.  also check if you have symptoms of your blood sugar being too high or too low.  please keep a record of the readings and bring it to your next appointment here.  You can write it on any piece of paper.  please call us sooner if your blood sugar goes below 70, or if you have a lot of readings over 200.  please consider these measures for your health:  minimize alcohol.  do not use tobacco products.  have a colonoscopy at least every 10 years from age 27.  keep firearms safely stored.  always use seat belts.  have working smoke alarms in your home.  see an eye doctor and dentist regularly.  never drive under the influence of alcohol or drugs (including prescription drugs).  those with fair skin should take precautions against the sun. good diet and exercise habits significanly improve the control of your diabetes.  please let me know if you wish to be referred to a dietician.  high blood sugar is very risky to your health.  you should see an eye doctor and dentist every year.  It is very important to get all recommended vaccinations.  it is critically important to prevent falling down (keep floor areas well-lit, dry, and free of loose objects.  If you have a cane, walker, or wheelchair, you should use it, even for short trips around the house.  Also, try not to rush).

## 2014-12-07 NOTE — Progress Notes (Signed)
we discussed code status.  pt requests full code, but would not want to be started or maintained on artificial life-support measures if there was not a reasonable chance of recovery 

## 2014-12-07 NOTE — Progress Notes (Signed)
Subjective:    Patient ID: Chad Mcmillan, male    DOB: [birth year], 81 y.o.   MRN: [id number]  HPI Pt returns for f/u of diabetes mellitus:  DM type: Insulin-requiring type 2 Dx'ed: 123XX123 Complications: renal insuff, CAD, retinopathy, polyneuropathy, and CVA Therapy: insulin since 2004 DKA: never Severe hypoglycemia: never Pancreatitis: never Other: he takes multiple daily injections; he prefers human insulin due to cost. Interval history: He denies hypoglycemia.  no cbg record, but states cbg's are well-controlled.  He says cbg's are still lowest in the afternoon (60's).  pt states he feels well in general.  Past Medical History  Diagnosis Date  . CORONARY ARTERY DISEASE 06/13/2007  . DIABETES MELLITUS, TYPE I, WITH OPHTHALMIC COMPLICATIONS 123456  . HYPERLIPIDEMIA 06/13/2007  . HYPERTENSION 06/13/2007  . ADENOCARCINOMA, PROSTATE 06/01/2008  . NEOPLASM, MALIGNANT, SKIN, FACE 06/01/2008  . HYPERKALEMIA 06/01/2008    due to DM neparopathy  . CVA 06/01/2008    x 2  . Other chronic nonalcoholic liver disease [id number]  . RENAL DISEASE 06/01/2008  . DM retinopathy     Past Surgical History  Procedure Laterality Date  . Cataract extraction    . Cholecystectomy    . Hernia repair      right Inguinal hernia  . Esophagogastroduodenoscopy  06/28/1991  . Rest/stress cardiolite  01/22/2001  . Carotid duplex  09/12/2004    History   Social History  . Marital Status: Married    Spouse Name: N/A    Number of Children: N/A  . Years of Education: N/A   Occupational History  . Retired    Social History Main Topics  . Smoking status: Former Smoker    Quit date: 11/18/1978  . Smokeless tobacco: Not on file  . Alcohol Use: No  . Drug Use: No  . Sexual Activity: Not on file   Other Topics Concern  . Not on file   Social History Narrative    Current Outpatient Prescriptions on File Prior to Visit  Medication Sig Dispense Refill  . amLODipine (NORVASC) 2.5 MG tablet TAKE ONE  TABLET BY MOUTH ONCE DAILY 30 tablet 0  . aspirin 325 MG tablet Take 325 mg by mouth daily.      . furosemide (LASIX) 20 MG tablet TAKE ONE TABLET BY MOUTH ONCE DAILY 30 tablet 0  . glucose blood (ONE TOUCH ULTRA TEST) test strip Use as directed to check blood sugar twice daily Dx code: 250.51 200 each 3  . hydrALAZINE (APRESOLINE) 10 MG tablet Take 1 tablet (10 mg total) by mouth 3 (three) times daily. 270 tablet 1  . hydrochlorothiazide (MICROZIDE) 12.5 MG capsule TAKE ONE CAPSULE BY MOUTH ONCE DAILY 30 capsule 0  . ibuprofen (ADVIL,MOTRIN) 200 MG tablet Take 100-200 mg by mouth every 6 (six) hours as needed. For pain    . insulin aspart (NOVOLOG) 100 UNIT/ML injection INJECT 18 UNITS UNDER THE SKIN WITH BREAKFAST, 8 UNITS WITH LUNCH, AND 13 UNITS WITH THE EVENING MEAL (Patient taking differently: INJECT 18 UNITS UNDER THE SKIN WITH BREAKFAST, 5 UNITS WITH LUNCH, AND 13 UNITS WITH THE EVENING MEAL) 20 mL 11  . insulin NPH Human (HUMULIN N) 100 UNIT/ML injection Inject 20 units at bed time. 10 mL 2  . Insulin Syringe-Needle U-100 (B-D INS SYR ULTRAFINE .3CC/30G) 30G X 1/2" 0.3 ML MISC USE AS DIRECTED 4 (FOUR) TIMES DAILY 300 each 4  . isosorbide mononitrate (IMDUR) 30 MG 24 hr tablet TAKE ONE TABLET BY MOUTH ONCE DAILY 90  tablet 1  . Lancets (ONETOUCH ULTRASOFT) lancets Use as directed to check blood sugar twice daily. Dx code: 250.01 200 each 3  . metoprolol tartrate (LOPRESSOR) 25 MG tablet TAKE ONE-HALF TABLET BY MOUTH TWICE DAILY 30 tablet 0  . nitroGLYCERIN (NITROSTAT) 0.4 MG SL tablet Place 1 tablet (0.4 mg total) under the tongue every 5 (five) minutes as needed. For chest pain 25 tablet 2  . pravastatin (PRAVACHOL) 80 MG tablet Take 1 tablet (80 mg total) by mouth daily. 90 tablet 3  . promethazine (PHENERGAN) 12.5 MG tablet Take 0.5 tablets (6.25 mg total) by mouth every 6 (six) hours as needed for nausea or vomiting. 14 tablet 0   No current facility-administered medications on file prior  to visit.    No Known Allergies  No family history on file.  BP 128/54 mmHg  Pulse 64  Temp(Src) 97.9 F (36.6 C) (Oral)  Wt 213 lb (96.616 kg)  SpO2 97%    Review of Systems He denies LOC and weight change.     Objective:   Physical Exam VITAL SIGNS:  See vs page.   GENERAL: no distress. Pulses: dorsalis pedis intact bilat.  Feet: no deformity. no ulcer on the feet. 1+ bilat leg edema, cyanosis, and varicosities. onychomycosis of toenails. Left great toenail is absent.  Neuro: sensation is intact to touch on the feet, but slightly decreasedfrom normal.   Lab Results  Component Value Date   HGBA1C 7.1* 12/07/2014   Lab Results  Component Value Date   CREATININE 2.03* 12/07/2014   BUN 51* 12/07/2014   NA 134* 12/07/2014   K 4.9 12/07/2014   CL 101 12/07/2014   CO2 30 12/07/2014   Lab Results  Component Value Date   TSH 3.30 12/07/2014   Lab Results  Component Value Date   WBC 5.4 12/07/2014   HGB 12.0* 12/07/2014   HCT 35.1* 12/07/2014   MCV 90.3 12/07/2014   PLT 115.0* 12/07/2014       Assessment & Plan:  Anemia: mild.  Further w/u not indicated, given advanced age, but we'll follow DM: aggressive glycemic control not indicated at advanced age.  reduce the lunch insulin to 5 units. Renal failure: stable.  We'll follow.   Thrombocytopenia: stable.   We'll follow.    Subjective:   Patient here for Medicare annual wellness visit and management of other chronic and acute problems.     Risk factors: advanced age    46 of Physicians Providing Medical Care to Patient:  See "snapshot"   Activities of Daily Living: In your present state of health, do you have any difficulty performing the following activities?:  Preparing food and eating?: No  Bathing yourself: No  Getting dressed: No  Using the toilet:No  Moving around from place to place: No  In the past year have you fallen or had a near fall?:No    Home Safety: Has smoke detector and wears  seat belts. Firearms are safely stored. No excess sun exposure.  Diet and Exercise  Current exercise habits: pt says good, as limited by health problems and advanced age Dietary issues discussed: pt reports a healthy diet   Depression Screen  Q1: Over the past two weeks, have you felt down, depressed or hopeless? no  Q2: Over the past two weeks, have you felt little interest or pleasure in doing things? no   The following portions of the patient's history were reviewed and updated as appropriate: allergies, current medications, past family history, past  medical history, past social history, past surgical history and problem list.   Review of Systems  Denies hearing loss, and visual loss Objective:   Vision:  Sees opthalmologist Hearing: grossly normal Body mass index:  See vs page Msk: pt easily slowly performs "get-up-and-go" from a sitting position.   Cognitive Impairment Assessment: cognition, memory and judgment appear normal.  remembers 3/3 at 5 minutes.  excellent recall.  can easily read and write a sentence.  alert and oriented x 3.     Assessment:   Medicare wellness utd on preventive parameters    Plan:   During the course of the visit the patient was educated and counseled about appropriate screening and preventive services including:        Fall prevention    Diabetes screening  Nutrition counseling   Vaccines / LABS Zostavax / Pneumococcal Vaccine  today  PSA  Patient Instructions (the written plan) was given to the patient.

## 2014-12-22 ENCOUNTER — Other Ambulatory Visit: Payer: Self-pay | Admitting: Endocrinology

## 2015-01-22 ENCOUNTER — Other Ambulatory Visit: Payer: Self-pay | Admitting: Endocrinology

## 2015-01-23 ENCOUNTER — Other Ambulatory Visit: Payer: Self-pay | Admitting: *Deleted

## 2015-01-23 MED ORDER — METOPROLOL TARTRATE 25 MG PO TABS: 12.5000 mg | ORAL_TABLET | Freq: Two times a day (BID) | ORAL | Status: DC

## 2015-01-23 MED ORDER — FUROSEMIDE 20 MG PO TABS: 20.0000 mg | ORAL_TABLET | Freq: Every day | ORAL | Status: DC

## 2015-01-23 MED ORDER — ISOSORBIDE MONONITRATE ER 30 MG PO TB24: ORAL_TABLET | ORAL | Status: DC

## 2015-01-23 MED ORDER — HYDROCHLOROTHIAZIDE 12.5 MG PO CAPS: 12.5000 mg | ORAL_CAPSULE | Freq: Every day | ORAL | Status: DC

## 2015-01-23 MED ORDER — AMLODIPINE BESYLATE 2.5 MG PO TABS: 2.5000 mg | ORAL_TABLET | Freq: Every day | ORAL | Status: DC

## 2015-02-27 NOTE — Telephone Encounter (Signed)
Error

## 2015-03-08 ENCOUNTER — Encounter: Payer: Self-pay | Admitting: Endocrinology

## 2015-03-08 ENCOUNTER — Ambulatory Visit (INDEPENDENT_AMBULATORY_CARE_PROVIDER_SITE_OTHER): Payer: Medicare Other | Admitting: Endocrinology

## 2015-03-08 VITALS — BP 134/62 | HR 58 | Temp 97.8°F | Ht 72.0 in | Wt 207.0 lb

## 2015-03-08 DIAGNOSIS — N289 Disorder of kidney and ureter, unspecified: Secondary | ICD-10-CM | POA: Diagnosis not present

## 2015-03-08 DIAGNOSIS — E1122 Type 2 diabetes mellitus with diabetic chronic kidney disease: Secondary | ICD-10-CM | POA: Diagnosis not present

## 2015-03-08 DIAGNOSIS — T887XXA Unspecified adverse effect of drug or medicament, initial encounter: Secondary | ICD-10-CM | POA: Diagnosis not present

## 2015-03-08 DIAGNOSIS — N189 Chronic kidney disease, unspecified: Secondary | ICD-10-CM

## 2015-03-08 DIAGNOSIS — T50905A Adverse effect of unspecified drugs, medicaments and biological substances, initial encounter: Secondary | ICD-10-CM

## 2015-03-08 LAB — HEPATIC FUNCTION PANEL
ALK PHOS: 88 U/L (ref 39–117)
ALT: 22 U/L (ref 0–53)
AST: 30 U/L (ref 0–37)
Albumin: 3.8 g/dL (ref 3.5–5.2)
Bilirubin, Direct: 0.1 mg/dL (ref 0.0–0.3)
TOTAL PROTEIN: 7 g/dL (ref 6.0–8.3)
Total Bilirubin: 0.5 mg/dL (ref 0.2–1.2)

## 2015-03-08 LAB — HEMOGLOBIN A1C: HEMOGLOBIN A1C: 7.2 % — AB (ref 4.6–6.5)

## 2015-03-08 MED ORDER — TERBINAFINE HCL 250 MG PO TABS: 250.0000 mg | ORAL_TABLET | Freq: Every day | ORAL | Status: DC

## 2015-03-08 MED ORDER — INSULIN ASPART 100 UNIT/ML ~~LOC~~ SOLN: SUBCUTANEOUS | Status: DC

## 2015-03-08 NOTE — Progress Notes (Signed)
Subjective:    Patient ID: Chad Mcmillan, male    DOB: 1924/07/31, 81 y.o.   MRN: [id number]  HPI Pt returns for f/u of diabetes mellitus:  DM type: Insulin-requiring type 2 Dx'ed: 123XX123 Complications: renal insuff, CAD, retinopathy, polyneuropathy, and CVA Therapy: insulin since 2004. DKA: never Severe hypoglycemia: never Pancreatitis: never Other: he takes multiple daily injections; he prefers human insulin due to cost. Interval history: He denies hypoglycemia.  no cbg record, but states cbg's vary from 61 (am) to 176 (afternoon).  Past Medical History  Diagnosis Date  . CORONARY ARTERY DISEASE 06/13/2007  . DIABETES MELLITUS, TYPE I, WITH OPHTHALMIC COMPLICATIONS 123456  . HYPERLIPIDEMIA 06/13/2007  . HYPERTENSION 06/13/2007  . ADENOCARCINOMA, PROSTATE 06/01/2008  . NEOPLASM, MALIGNANT, SKIN, FACE 06/01/2008  . HYPERKALEMIA 06/01/2008    due to DM neparopathy  . CVA 06/01/2008    x 2  . Other chronic nonalcoholic liver disease [id number]  . RENAL DISEASE 06/01/2008  . DM retinopathy     Past Surgical History  Procedure Laterality Date  . Cataract extraction    . Cholecystectomy    . Hernia repair      right Inguinal hernia  . Esophagogastroduodenoscopy  06/28/1991  . Rest/stress cardiolite  01/22/2001  . Carotid duplex  09/12/2004    History   Social History  . Marital Status: Married    Spouse Name: N/A  . Number of Children: N/A  . Years of Education: N/A   Occupational History  . Retired    Social History Main Topics  . Smoking status: Former Smoker    Quit date: 11/18/1978  . Smokeless tobacco: Not on file  . Alcohol Use: No  . Drug Use: No  . Sexual Activity: Not on file   Other Topics Concern  . Not on file   Social History Narrative    Current Outpatient Prescriptions on File Prior to Visit  Medication Sig Dispense Refill  . amLODipine (NORVASC) 2.5 MG tablet Take 1 tablet (2.5 mg total) by mouth daily. 30 tablet 2  . aspirin 325 MG tablet  Take 325 mg by mouth daily.      . furosemide (LASIX) 20 MG tablet Take 1 tablet (20 mg total) by mouth daily. 30 tablet 2  . glucose blood (ONE TOUCH ULTRA TEST) test strip Use as directed to check blood sugar twice daily Dx code: 250.51 200 each 3  . hydrALAZINE (APRESOLINE) 10 MG tablet Take 1 tablet (10 mg total) by mouth 3 (three) times daily. 270 tablet 1  . hydrochlorothiazide (MICROZIDE) 12.5 MG capsule Take 1 capsule (12.5 mg total) by mouth daily. 30 capsule 2  . ibuprofen (ADVIL,MOTRIN) 200 MG tablet Take 100-200 mg by mouth every 6 (six) hours as needed. For pain    . insulin NPH Human (HUMULIN N) 100 UNIT/ML injection Inject 20 units at bed time. (Patient taking differently: Inject 18 Units into the skin at bedtime. ) 10 mL 2  . Insulin Syringe-Needle U-100 (B-D INS SYR ULTRAFINE .3CC/30G) 30G X 1/2" 0.3 ML MISC USE AS DIRECTED 4 (FOUR) TIMES DAILY 300 each 4  . isosorbide mononitrate (IMDUR) 30 MG 24 hr tablet TAKE ONE TABLET BY MOUTH ONCE DAILY 30 tablet 2  . Lancets (ONETOUCH ULTRASOFT) lancets Use as directed to check blood sugar twice daily. Dx code: 250.01 200 each 3  . metoprolol tartrate (LOPRESSOR) 25 MG tablet Take 0.5 tablets (12.5 mg total) by mouth 2 (two) times daily. 60 tablet 2  . nitroGLYCERIN (  NITROSTAT) 0.4 MG SL tablet Place 1 tablet (0.4 mg total) under the tongue every 5 (five) minutes as needed. For chest pain 25 tablet 2  . pravastatin (PRAVACHOL) 80 MG tablet Take 1 tablet (80 mg total) by mouth daily. 90 tablet 3   No current facility-administered medications on file prior to visit.    No Known Allergies  No family history on file.  BP 134/62 mmHg  Pulse 58  Temp(Src) 97.8 F (36.6 C) (Oral)  Ht 6' (1.829 m)  Wt 207 lb (93.895 kg)  BMI 28.07 kg/m2  SpO2 98%    Review of Systems He denies LOC and weight change    Objective:   Physical Exam VITAL SIGNS:  See vs page GENERAL: no distress Pulses: dorsalis pedis intact bilat.   MSK: no  deformity of the feet CV: no leg edema Skin:  no ulcer on the feet.  normal color and temp on the feet. Neuro: sensation is intact to touch on the feet Ext: There is severe bilateral onychomycosis of the toenails   Lab Results  Component Value Date   HGBA1C 7.2* 03/08/2015   Lab Results  Component Value Date   ALT 22 03/08/2015   AST 30 03/08/2015   ALKPHOS 88 03/08/2015   BILITOT 0.5 03/08/2015       Assessment & Plan:  Onychomycosis, worse DM: therapy limited by hypoglycemia.  He is not a candidate for a1c < 7%.  Patient is advised the following: Patient Instructions  Please come back for a follow-up appointment in 3 months.   blood tests are requested for you today.  We'll contact you with results. i have sent a prescription to your pharmacy, for the toenail fungus. If today's liver test is normal, please start taking. Please reduce the bedtime insulin to 18 units. check your blood sugar twice a day.  vary the time of day when you check, between before the 3 meals, and at bedtime.  also check if you have symptoms of your blood sugar being too high or too low.  please keep a record of the readings and bring it to your next appointment here.  You can write it on any piece of paper.  please call us sooner if your blood sugar goes below 70, or if you have a lot of readings over 200.

## 2015-03-08 NOTE — Patient Instructions (Addendum)
Please come back for a follow-up appointment in 3 months.   blood tests are requested for you today.  We'll contact you with results. i have sent a prescription to your pharmacy, for the toenail fungus. If today's liver test is normal, please start taking. Please reduce the bedtime insulin to 18 units. check your blood sugar twice a day.  vary the time of day when you check, between before the 3 meals, and at bedtime.  also check if you have symptoms of your blood sugar being too high or too low.  please keep a record of the readings and bring it to your next appointment here.  You can write it on any piece of paper.  please call us sooner if your blood sugar goes below 70, or if you have a lot of readings over 200.

## 2015-03-22 ENCOUNTER — Other Ambulatory Visit: Payer: Self-pay | Admitting: Endocrinology

## 2015-04-07 ENCOUNTER — Other Ambulatory Visit: Payer: Self-pay | Admitting: Endocrinology

## 2015-04-07 NOTE — Telephone Encounter (Signed)
Please advise if ok to refill Rx. Medication is no on current list.  Thanks!

## 2015-04-23 ENCOUNTER — Other Ambulatory Visit: Payer: Self-pay | Admitting: Endocrinology

## 2015-05-23 ENCOUNTER — Other Ambulatory Visit: Payer: Self-pay | Admitting: Endocrinology

## 2015-06-07 ENCOUNTER — Ambulatory Visit (INDEPENDENT_AMBULATORY_CARE_PROVIDER_SITE_OTHER): Payer: Medicare Other | Admitting: Endocrinology

## 2015-06-07 ENCOUNTER — Encounter: Payer: Self-pay | Admitting: Endocrinology

## 2015-06-07 VITALS — BP 130/82 | HR 54 | Temp 97.7°F | Resp 16 | Ht 72.0 in | Wt 207.0 lb

## 2015-06-07 DIAGNOSIS — N189 Chronic kidney disease, unspecified: Secondary | ICD-10-CM | POA: Diagnosis not present

## 2015-06-07 DIAGNOSIS — E1122 Type 2 diabetes mellitus with diabetic chronic kidney disease: Secondary | ICD-10-CM | POA: Diagnosis not present

## 2015-06-07 LAB — POCT GLYCOSYLATED HEMOGLOBIN (HGB A1C): HEMOGLOBIN A1C: 7.3

## 2015-06-07 MED ORDER — ONETOUCH ULTRA 2 W/DEVICE KIT: 1.0000 | PACK | Freq: Once | Status: DC

## 2015-06-07 NOTE — Progress Notes (Signed)
Subjective:    Patient ID: Chad Mcmillan, male    DOB: 10-Jan-1924, 81 y.o.   MRN: [id number]  HPI Pt returns for f/u of diabetes mellitus:  DM type: Insulin-requiring type 2 Dx'ed: [id number] Complications: renal insuff, CAD, retinopathy, polyneuropathy, and CVA.   Therapy: insulin since 2004. DKA: never. Severe hypoglycemia: never Pancreatitis: never. Other: he takes multiple daily injections; he prefers human insulin due to cost. Interval history: no cbg record, but states cbg's vary from 97 (am) to 162 (lunch).  pt states he feels well in general.  Past Medical History  Diagnosis Date  . CORONARY ARTERY DISEASE 06/13/2007  . DIABETES MELLITUS, TYPE I, WITH OPHTHALMIC COMPLICATIONS [id number]  . HYPERLIPIDEMIA 06/13/2007  . HYPERTENSION 06/13/2007  . ADENOCARCINOMA, PROSTATE 06/01/2008  . NEOPLASM, MALIGNANT, SKIN, FACE 06/01/2008  . HYPERKALEMIA 06/01/2008    due to DM neparopathy  . CVA 06/01/2008    x 2  . Other chronic nonalcoholic liver disease [id number]  . RENAL DISEASE 06/01/2008  . DM retinopathy     Past Surgical History  Procedure Laterality Date  . Cataract extraction    . Cholecystectomy    . Hernia repair      right Inguinal hernia  . Esophagogastroduodenoscopy  06/28/1991  . Rest/stress cardiolite  01/22/2001  . Carotid duplex  09/12/2004    History   Social History  . Marital Status: Married    Spouse Name: N/A  . Number of Children: N/A  . Years of Education: N/A   Occupational History  . Retired    Social History Main Topics  . Smoking status: Former Smoker    Quit date: 11/18/1978  . Smokeless tobacco: Not on file  . Alcohol Use: No  . Drug Use: No  . Sexual Activity: Not on file   Other Topics Concern  . Not on file   Social History Narrative    Current Outpatient Prescriptions on File Prior to Visit  Medication Sig Dispense Refill  . amLODipine (NORVASC) 2.5 MG tablet TAKE ONE TABLET BY MOUTH ONCE DAILY. 30 tablet 0  . aspirin 325 MG  tablet Take 325 mg by mouth daily.      . B-D INS SYR ULTRAFINE .3CC/30G 30G X 1/2" 0.3 ML MISC USE AS DIRECTED 4 TIMES DAILY 300 each 1  . furosemide (LASIX) 20 MG tablet TAKE ONE TABLET BY MOUTH ONCE DAILY. 30 tablet 0  . HUMALOG 100 UNIT/ML injection INJECT 18 UNITS SUBCUTANEOUSLY WITH BREAKFAST; 8 UNITS WITH LUNCH AND 13 UNITS WITH THE EVENING MEAL 20 mL 11  . hydrALAZINE (APRESOLINE) 10 MG tablet Take 1 tablet (10 mg total) by mouth 3 (three) times daily. 270 tablet 1  . hydrochlorothiazide (MICROZIDE) 12.5 MG capsule TAKE ONE CAPSULE BY MOUTH ONCE DAILY. 30 capsule 0  . ibuprofen (ADVIL,MOTRIN) 200 MG tablet Take 100-200 mg by mouth every 6 (six) hours as needed. For pain    . insulin aspart (NOVOLOG) 100 UNIT/ML injection INJECT 18 UNITS UNDER THE SKIN WITH BREAKFAST, 5 UNITS WITH LUNCH, AND 13 UNITS WITH THE EVENING MEAL 20 mL 11  . insulin NPH Human (HUMULIN N) 100 UNIT/ML injection Inject 20 units at bed time. (Patient taking differently: Inject 18 Units into the skin at bedtime. ) 10 mL 2  . isosorbide mononitrate (IMDUR) 30 MG 24 hr tablet TAKE ONE TABLET BY MOUTH ONCE DAILY 30 tablet 2  . isosorbide mononitrate (IMDUR) 30 MG 24 hr tablet TAKE ONE TABLET BY MOUTH ONCE DAILY. 90 tablet 0  .  Lancets (ONETOUCH ULTRASOFT) lancets USE AS DIRECTED TO CHECK BLOOD SUGAR TWICE DAILY 200 each 0  . metoprolol tartrate (LOPRESSOR) 25 MG tablet Take 0.5 tablets (12.5 mg total) by mouth 2 (two) times daily. 60 tablet 2  . nitroGLYCERIN (NITROSTAT) 0.4 MG SL tablet Place 1 tablet (0.4 mg total) under the tongue every 5 (five) minutes as needed. For chest pain 25 tablet 2  . ONE TOUCH ULTRA TEST test strip USE AS DIRECTED TO CHECK BLOOD SUGAR TWICE DAILY 200 each 1  . pravastatin (PRAVACHOL) 80 MG tablet TAKE ONE TABLET BY MOUTH ONCE DAILY 90 tablet 0  . terbinafine (LAMISIL) 250 MG tablet Take 1 tablet (250 mg total) by mouth daily. 90 tablet 0   No current facility-administered medications on file  prior to visit.    No Known Allergies  No family history on file.  BP 130/82 mmHg  Pulse 54  Temp(Src) 97.7 F (36.5 C) (Oral)  Resp 16  Ht 6' (1.829 m)  Wt 207 lb (93.895 kg)  BMI 28.07 kg/m2  SpO2 98%  Review of Systems He denies hypoglycemia.     Objective:   Physical Exam VITAL SIGNS:  See vs page GENERAL: no distress Pulses: dorsalis pedis intact bilat.   MSK: no deformity of the feet  CV: trace bilat leg edema  Skin: no ulcer on the feet. normal color and temp on the feet.  Neuro: sensation is intact to touch on the feet  Ext: There is severe bilateral onychomycosis of the toenails   A1c=7.3%    Assessment & Plan:  DM: well-controlled.     Patient is advised the following: Patient Instructions  Please come back for a follow-up appointment in 3-4 months.   check your blood sugar twice a day.  vary the time of day when you check, between before the 3 meals, and at bedtime.  also check if you have symptoms of your blood sugar being too high or too low.  please keep a record of the readings and bring it to your next appointment here.  You can write it on any piece of paper.  please call us sooner if your blood sugar goes below 70, or if you have a lot of readings over 200.  Please continue the same insulins.

## 2015-06-07 NOTE — Patient Instructions (Addendum)
Please come back for a follow-up appointment in 3-4 months.   check your blood sugar twice a day.  vary the time of day when you check, between before the 3 meals, and at bedtime.  also check if you have symptoms of your blood sugar being too high or too low.  please keep a record of the readings and bring it to your next appointment here.  You can write it on any piece of paper.  please call us sooner if your blood sugar goes below 70, or if you have a lot of readings over 200.  Please continue the same insulins.

## 2015-06-08 ENCOUNTER — Telehealth: Payer: Self-pay | Admitting: Endocrinology

## 2015-06-08 ENCOUNTER — Other Ambulatory Visit: Payer: Self-pay

## 2015-06-08 MED ORDER — ONETOUCH ULTRA 2 W/DEVICE KIT: PACK | Status: DC

## 2015-06-08 MED ORDER — ONETOUCH ULTRA 2 W/DEVICE KIT
1.0000 | PACK | Freq: Once | Status: DC
Start: 2015-06-08 — End: 2015-06-08

## 2015-06-08 NOTE — Telephone Encounter (Signed)
Pt requesting new rx for meter one touch ultra called in to [facility] on [address] I saw the rx was called in but they are telling the pt they never got it

## 2015-06-09 ENCOUNTER — Telehealth: Payer: Self-pay

## 2015-06-09 ENCOUNTER — Other Ambulatory Visit: Payer: Self-pay

## 2015-06-09 MED ORDER — ONETOUCH ULTRA 2 W/DEVICE KIT: PACK | Status: DC

## 2015-06-09 NOTE — Telephone Encounter (Signed)
Could you please resend in a new RX for the one touch ultra 2 with a new Dx code.the E11.22 is not being covered for by his insurance.He is to check blood sugars twice a day.

## 2015-06-09 NOTE — Telephone Encounter (Signed)
I contacted the pt's pharmacy and advised we have resubmitted his One touch Ultra meter. The pharmacy tech stated they were able to get the prescription to go through and they would contacted the pt.

## 2015-06-22 ENCOUNTER — Other Ambulatory Visit: Payer: Self-pay | Admitting: Endocrinology

## 2015-07-20 ENCOUNTER — Other Ambulatory Visit: Payer: Self-pay | Admitting: Endocrinology

## 2015-08-14 ENCOUNTER — Encounter: Payer: Self-pay | Admitting: Internal Medicine

## 2015-08-14 ENCOUNTER — Ambulatory Visit (INDEPENDENT_AMBULATORY_CARE_PROVIDER_SITE_OTHER): Payer: Medicare Other | Admitting: Internal Medicine

## 2015-08-14 VITALS — BP 138/76 | HR 58 | Ht 72.0 in | Wt 206.0 lb

## 2015-08-14 DIAGNOSIS — I10 Essential (primary) hypertension: Secondary | ICD-10-CM

## 2015-08-14 LAB — BASIC METABOLIC PANEL
BUN: 52 mg/dL — ABNORMAL HIGH (ref 6–23)
CO2: 29 mEq/L (ref 19–32)
CREATININE: 1.73 mg/dL — AB (ref 0.40–1.50)
Calcium: 9.4 mg/dL (ref 8.4–10.5)
Chloride: 101 mEq/L (ref 96–112)
GFR: 39.7 mL/min — ABNORMAL LOW (ref >60.00)
GLUCOSE: 337 mg/dL — AB (ref 70–99)
Potassium: 5 mEq/L (ref 3.5–5.1)
Sodium: 137 mEq/L (ref 135–145)

## 2015-08-14 NOTE — Patient Instructions (Signed)
Your physician recommends that you continue on your current medications as directed. Please refer to the Current Medication list given to you today. Your physician recommends that you return for lab work in: today [place])  Your physician wants you to follow-up in: AUG/SEPT 2017 [place].  You will receive a reminder letter in the mail two months in advance. If you don't receive a letter, please call our office to schedule the follow-up appointment.

## 2015-08-14 NOTE — Progress Notes (Addendum)
Cardiology Office Note   Date:  08/14/2015   ID:  Chad Mcmillan, DOB [birth year], MRN [id number]  PCP:  [name], MD  Cardiologist:   Dorris Carnes, MD   No chief complaint on file.     History of Present Illness: Chad Mcmillan is a 81 y.o. male with a history of CAD  Last cath in 2000. (LAD 50%, 70%, Diag 90% LCX 70 to 80%. LVEF 70. He had a myoview in Fall 2010.  I saw him in Aug of last year.  Since seen he denies CP  Breathing is stable  No change in activity level    Current Outpatient Prescriptions  Medication Sig Dispense Refill  . amLODipine (NORVASC) 2.5 MG tablet TAKE ONE TABLET BY MOUTH ONCE DAILY 30 tablet 5  . aspirin 325 MG tablet Take 325 mg by mouth daily.      . B-D INS SYR ULTRAFINE .3CC/30G 30G X 1/2" 0.3 ML MISC USE AS DIRECTED 4 TIMES DAILY 300 each 1  . Blood Glucose Monitoring Suppl (ONE TOUCH ULTRA 2) W/DEVICE KIT Use to check blood sugar 2 times per day. Dx Code E11.22 1 each 2  . furosemide (LASIX) 20 MG tablet TAKE ONE TABLET BY MOUTH ONCE DAILY 30 tablet 5  . HUMALOG 100 UNIT/ML injection INJECT 18 UNITS SUBCUTANEOUSLY WITH BREAKFAST; 8 UNITS WITH LUNCH AND 13 UNITS WITH THE EVENING MEAL 20 mL 11  . hydrALAZINE (APRESOLINE) 10 MG tablet TAKE ONE TABLET BY MOUTH THREE TIMES DAILY. 270 tablet 0  . hydrochlorothiazide (MICROZIDE) 12.5 MG capsule TAKE ONE CAPSULE BY MOUTH ONCE DAILY 30 capsule 5  . ibuprofen (ADVIL,MOTRIN) 200 MG tablet Take 100-200 mg by mouth every 6 (six) hours as needed. For pain    . insulin aspart (NOVOLOG) 100 UNIT/ML injection INJECT 18 UNITS UNDER THE SKIN WITH BREAKFAST, 5 UNITS WITH LUNCH, AND 13 UNITS WITH THE EVENING MEAL 20 mL 11  . insulin NPH Human (HUMULIN N) 100 UNIT/ML injection Inject 20 units at bed time. (Patient taking differently: Inject 18 Units into the skin at bedtime. ) 10 mL 2  . isosorbide mononitrate (IMDUR) 30 MG 24 hr tablet TAKE ONE TABLET BY MOUTH ONCE DAILY 30 tablet 2  . Lancets (ONETOUCH ULTRASOFT)  lancets USE AS DIRECTED TO CHECK BLOOD SUGAR TWICE DAILY 200 each 0  . metoprolol tartrate (LOPRESSOR) 25 MG tablet Take 0.5 tablets (12.5 mg total) by mouth 2 (two) times daily. 60 tablet 2  . nitroGLYCERIN (NITROSTAT) 0.4 MG SL tablet Place 1 tablet (0.4 mg total) under the tongue every 5 (five) minutes as needed. For chest pain 25 tablet 2  . ONE TOUCH ULTRA TEST test strip USE AS DIRECTED TO CHECK BLOOD SUGAR TWICE DAILY 200 each 1  . pravastatin (PRAVACHOL) 80 MG tablet TAKE ONE TABLET BY MOUTH ONCE DAILY 90 tablet 0  . terbinafine (LAMISIL) 250 MG tablet Take 1 tablet (250 mg total) by mouth daily. 90 tablet 0   No current facility-administered medications for this visit.    Allergies:   Review of patient's allergies indicates no known allergies.   Past Medical History  Diagnosis Date  . CORONARY ARTERY DISEASE 06/13/2007  . DIABETES MELLITUS, TYPE I, WITH OPHTHALMIC COMPLICATIONS 4/31/5400  . HYPERLIPIDEMIA 06/13/2007  . HYPERTENSION 06/13/2007  . ADENOCARCINOMA, PROSTATE 06/01/2008  . NEOPLASM, MALIGNANT, SKIN, FACE 06/01/2008  . HYPERKALEMIA 06/01/2008    due to DM neparopathy  . CVA 06/01/2008    x 2  . Other  chronic nonalcoholic liver disease 08/08/1940  . RENAL DISEASE 06/01/2008  . DM retinopathy     Past Surgical History  Procedure Laterality Date  . Cataract extraction    . Cholecystectomy    . Hernia repair      right Inguinal hernia  . Esophagogastroduodenoscopy  06/28/1991  . Rest/stress cardiolite  01/22/2001  . Carotid duplex  09/12/2004     Social History:  The patient  reports that he quit smoking about 36 years ago. He does not have any smokeless tobacco history on file. He reports that he does not drink alcohol or use illicit drugs.   Family History:  The patient's family history is not on file.    ROS:  Please see the history of present illness. All other systems are reviewed and  Negative to the above problem except as noted.    PHYSICAL EXAM: VS:   BP 138/76 mmHg  Pulse 58  Ht 6' (1.829 m)  Wt 206 lb (93.441 kg)  BMI 27.93 kg/m2  GEN: Well nourished, well developed, in no acute distress HEENT: normal Neck: no JVD, carotid bruits, or masses Cardiac: RRR; no murmurs, rubs, or gallops,no edema  Respiratory:  clear to auscultation bilaterally, normal work of breathing GI: soft, nontender, nondistended, + BS  No hepatomegaly  MS: no deformity Moving all extremities   Skin: warm and dry, no rash Neuro:  Strength and sensation are intact Psych: euthymic mood, full affect   EKG:  EKG is  ordered today.  SB  52 bpm  First degree AV block  PR 302 msec  LBBB  Unchanged from previous     Lipid Panel    Component Value Date/Time   CHOL 106 09/07/2014 1334   TRIG 131.0 09/07/2014 1334   TRIG 51 09/29/2006 0801   HDL 30.40* 09/07/2014 1334   CHOLHDL 3 09/07/2014 1334   CHOLHDL 3.0 CALC 09/29/2006 0801   VLDL 26.2 09/07/2014 1334   LDLCALC 49 09/07/2014 1334      Wt Readings from Last 3 Encounters:  08/14/15 206 lb (93.441 kg)  06/07/15 207 lb (93.895 kg)  03/08/15 207 lb (93.895 kg)      ASSESSMENT AND PLAN:  1.  CAD  No symptoms of angina  2.  HL  Keep on statin    3. HTN  Keep on current regimen     Signed, Dorris Carnes, MD  08/14/2015 3:30 PM    [address], [place], [place]  [address] Phone: [phone]; Fax: [phone]

## 2015-08-20 ENCOUNTER — Other Ambulatory Visit: Payer: Self-pay | Admitting: Endocrinology

## 2015-08-31 ENCOUNTER — Ambulatory Visit (INDEPENDENT_AMBULATORY_CARE_PROVIDER_SITE_OTHER): Payer: Medicare Other

## 2015-08-31 DIAGNOSIS — Z23 Encounter for immunization: Secondary | ICD-10-CM | POA: Diagnosis not present

## 2015-10-09 ENCOUNTER — Encounter: Payer: Self-pay | Admitting: Endocrinology

## 2015-10-09 ENCOUNTER — Ambulatory Visit (INDEPENDENT_AMBULATORY_CARE_PROVIDER_SITE_OTHER): Payer: Medicare Other | Admitting: Endocrinology

## 2015-10-09 VITALS — BP 134/70 | HR 63 | Temp 97.7°F | Ht 72.0 in | Wt 206.0 lb

## 2015-10-09 DIAGNOSIS — E1122 Type 2 diabetes mellitus with diabetic chronic kidney disease: Secondary | ICD-10-CM

## 2015-10-09 LAB — POCT GLYCOSYLATED HEMOGLOBIN (HGB A1C): Hemoglobin A1C: 8.3

## 2015-10-09 MED ORDER — INSULIN NPH (HUMAN) (ISOPHANE) 100 UNIT/ML ~~LOC~~ SUSP: 18.0000 [IU] | Freq: Every day | SUBCUTANEOUS | Status: DC

## 2015-10-09 NOTE — Progress Notes (Signed)
Subjective:    Patient ID: Chad Mcmillan, male    DOB: 05-Jun-1924, 81 y.o.   MRN: [id number]  HPI Pt returns for f/u of diabetes mellitus:  DM type: Insulin-requiring type 2 Dx'ed: 5465 Complications: renal insuff, CAD, retinopathy, polyneuropathy, and CVA.   Therapy: insulin since 2004.   DKA: never. Severe hypoglycemia: never.  Pancreatitis: never. Other: he takes multiple daily injections; he prefers human insulin due to cost. Interval history: no cbg record, but states cbg's vary from 71 (am) to 131 (afternoon).  pt states he feels well in general.  He takes NPH, 20 units qhs, and humalog 3 times a day (just before each meal), 18-5-13 units Past Medical History  Diagnosis Date  . CORONARY ARTERY DISEASE 06/13/2007  . DIABETES MELLITUS, TYPE I, WITH OPHTHALMIC COMPLICATIONS 6/81/2751  . HYPERLIPIDEMIA 06/13/2007  . HYPERTENSION 06/13/2007  . ADENOCARCINOMA, PROSTATE 06/01/2008  . NEOPLASM, MALIGNANT, SKIN, FACE 06/01/2008  . HYPERKALEMIA 06/01/2008    due to DM neparopathy  . CVA 06/01/2008    x 2  . Other chronic nonalcoholic liver disease 7/00/1749  . RENAL DISEASE 06/01/2008  . DM retinopathy [facility])     Past Surgical History  Procedure Laterality Date  . Cataract extraction    . Cholecystectomy    . Hernia repair      right Inguinal hernia  . Esophagogastroduodenoscopy  06/28/1991  . Rest/stress cardiolite  01/22/2001  . Carotid duplex  09/12/2004    Social History   Social History  . Marital Status: Married    Spouse Name: N/A  . Number of Children: N/A  . Years of Education: N/A   Occupational History  . Retired    Social History Main Topics  . Smoking status: Former Smoker    Quit date: 11/18/1978  . Smokeless tobacco: Not on file  . Alcohol Use: No  . Drug Use: No  . Sexual Activity: Not on file   Other Topics Concern  . Not on file   Social History Narrative    Current Outpatient Prescriptions on File Prior to Visit  Medication Sig Dispense  Refill  . amLODipine (NORVASC) 2.5 MG tablet TAKE ONE TABLET BY MOUTH ONCE DAILY 30 tablet 5  . aspirin 325 MG tablet Take 325 mg by mouth daily.      . B-D INS SYR ULTRAFINE .3CC/30G 30G X 1/2" 0.3 ML MISC USE AS DIRECTED 4 TIMES DAILY 300 each 1  . Blood Glucose Monitoring Suppl (ONE TOUCH ULTRA 2) W/DEVICE KIT Use to check blood sugar 2 times per day. Dx Code E11.22 1 each 2  . furosemide (LASIX) 20 MG tablet TAKE ONE TABLET BY MOUTH ONCE DAILY 30 tablet 5  . hydrALAZINE (APRESOLINE) 10 MG tablet TAKE ONE TABLET BY MOUTH THREE TIMES DAILY. 270 tablet 0  . hydrochlorothiazide (MICROZIDE) 12.5 MG capsule TAKE ONE CAPSULE BY MOUTH ONCE DAILY 30 capsule 5  . ibuprofen (ADVIL,MOTRIN) 200 MG tablet Take 100-200 mg by mouth every 6 (six) hours as needed. For pain    . isosorbide mononitrate (IMDUR) 30 MG 24 hr tablet TAKE ONE TABLET BY MOUTH ONCE DAILY 30 tablet 2  . Lancets (ONETOUCH ULTRASOFT) lancets USE AS DIRECTED TO CHECK BLOOD SUGAR TWICE DAILY 200 each 0  . metoprolol tartrate (LOPRESSOR) 25 MG tablet TAKE ONE-HALF TABLET BY MOUTH TWICE DAILY. 60 tablet 0  . nitroGLYCERIN (NITROSTAT) 0.4 MG SL tablet Place 1 tablet (0.4 mg total) under the tongue every 5 (five) minutes as needed. For chest  pain 25 tablet 2  . ONE TOUCH ULTRA TEST test strip USE AS DIRECTED TO CHECK BLOOD SUGAR TWICE DAILY 200 each 1  . pravastatin (PRAVACHOL) 80 MG tablet TAKE ONE TABLET BY MOUTH ONCE DAILY 90 tablet 0   No current facility-administered medications on file prior to visit.    No Known Allergies  No family history on file.  BP 134/70 mmHg  Pulse 63  Temp(Src) 97.7 F (36.5 C) (Oral)  Ht 6' (1.829 m)  Wt 206 lb (93.441 kg)  BMI 27.93 kg/m2  SpO2 97%  Review of Systems He denies hypoglycemia.      Objective:   Physical Exam VITAL SIGNS:  See vs page GENERAL: no distress  SKIN:  Insulin injection sites at the anterior abdomen are normal    A1c=8.3%    Assessment & Plan:  DM: The pattern  of his cbg's indicates he needs some adjustment in his therapy.  Patient is advised the following: Patient Instructions  Please come back for a regular physical appointment in 3 months.  check your blood sugar twice a day.  vary the time of day when you check, between before the 3 meals, and at bedtime.  also check if you have symptoms of your blood sugar being too high or too low.  please keep a record of the readings and bring it to your next appointment here.  You can write it on any piece of paper.  please call us sooner if your blood sugar goes below 70, or if you have a lot of readings over 200.  Please reduce the NPH to 18 units at bedtime, and take humalog 3 times a day (just before each meal), 18-7-13 units.

## 2015-10-09 NOTE — Patient Instructions (Addendum)
Please come back for a regular physical appointment in 3 months.  check your blood sugar twice a day.  vary the time of day when you check, between before the 3 meals, and at bedtime.  also check if you have symptoms of your blood sugar being too high or too low.  please keep a record of the readings and bring it to your next appointment here.  You can write it on any piece of paper.  please call us sooner if your blood sugar goes below 70, or if you have a lot of readings over 200.  Please reduce the NPH to 18 units at bedtime, and take humalog 3 times a day (just before each meal), 18-7-13 units.

## 2015-10-20 ENCOUNTER — Other Ambulatory Visit: Payer: Self-pay | Admitting: Endocrinology

## 2015-11-20 ENCOUNTER — Other Ambulatory Visit: Payer: Self-pay | Admitting: Endocrinology

## 2015-11-29 ENCOUNTER — Ambulatory Visit (INDEPENDENT_AMBULATORY_CARE_PROVIDER_SITE_OTHER): Payer: Medicare Other | Admitting: Ophthalmology

## 2015-11-29 DIAGNOSIS — E11319 Type 2 diabetes mellitus with unspecified diabetic retinopathy without macular edema: Secondary | ICD-10-CM | POA: Diagnosis not present

## 2015-11-29 DIAGNOSIS — H353134 Nonexudative age-related macular degeneration, bilateral, advanced atrophic with subfoveal involvement: Secondary | ICD-10-CM | POA: Diagnosis not present

## 2015-11-29 DIAGNOSIS — H43813 Vitreous degeneration, bilateral: Secondary | ICD-10-CM

## 2015-11-29 DIAGNOSIS — H35033 Hypertensive retinopathy, bilateral: Secondary | ICD-10-CM | POA: Diagnosis not present

## 2015-11-29 DIAGNOSIS — I10 Essential (primary) hypertension: Secondary | ICD-10-CM

## 2015-11-29 DIAGNOSIS — E113293 Type 2 diabetes mellitus with mild nonproliferative diabetic retinopathy without macular edema, bilateral: Secondary | ICD-10-CM | POA: Diagnosis not present

## 2015-12-14 ENCOUNTER — Other Ambulatory Visit: Payer: Self-pay | Admitting: Endocrinology

## 2016-01-09 ENCOUNTER — Encounter: Payer: Self-pay | Admitting: Endocrinology

## 2016-01-09 ENCOUNTER — Ambulatory Visit (INDEPENDENT_AMBULATORY_CARE_PROVIDER_SITE_OTHER): Payer: Medicare Other | Admitting: Endocrinology

## 2016-01-09 VITALS — BP 128/74 | HR 62 | Temp 98.0°F | Resp 16 | Ht 72.0 in | Wt 202.2 lb

## 2016-01-09 DIAGNOSIS — E1122 Type 2 diabetes mellitus with diabetic chronic kidney disease: Secondary | ICD-10-CM | POA: Diagnosis not present

## 2016-01-09 DIAGNOSIS — E119 Type 2 diabetes mellitus without complications: Secondary | ICD-10-CM | POA: Diagnosis not present

## 2016-01-09 DIAGNOSIS — N183 Chronic kidney disease, stage 3 unspecified: Secondary | ICD-10-CM

## 2016-01-09 DIAGNOSIS — Z794 Long term (current) use of insulin: Secondary | ICD-10-CM | POA: Diagnosis not present

## 2016-01-09 LAB — POCT GLYCOSYLATED HEMOGLOBIN (HGB A1C): HEMOGLOBIN A1C: 8.3

## 2016-01-09 MED ORDER — INSULIN NPH (HUMAN) (ISOPHANE) 100 UNIT/ML ~~LOC~~ SUSP: 15.0000 [IU] | Freq: Every day | SUBCUTANEOUS | Status: DC

## 2016-01-09 NOTE — Patient Instructions (Addendum)
Please come back for a regular physical appointment in 2 months.  check your blood sugar twice a day.  vary the time of day when you check, between before the 3 meals, and at bedtime.  also check if you have symptoms of your blood sugar being too high or too low.  please keep a record of the readings and bring it to your next appointment here.  You can write it on any piece of paper.  please call us sooner if your blood sugar goes below 70, or if you have a lot of readings over 200.  Please reduce the NPH to 15 units at bedtime, and take humalog 3 times a day (just before each meal), 19-8-14 units. Please keep the foot scrape covered with antibiotic ointment and a large bandaid.  Please call if it gets worse, or is not better in 2 weeks.

## 2016-01-09 NOTE — Progress Notes (Signed)
Subjective:    Patient ID: Chad Mcmillan, male    DOB: Aug 14, 1924, 82 y.o.   MRN: [id number]  HPI Pt returns for f/u of diabetes mellitus:  DM type: Insulin-requiring type 2 Dx'ed: 8921 Complications: renal insuff, CAD, retinopathy, polyneuropathy, and CVA.   Therapy: insulin since 2004.   DKA: never.  Severe hypoglycemia: never.  Pancreatitis: never.  Other: he takes multiple daily injections; he prefers human insulin due to cost.  Interval history: he brings a record of his cbg's which i have reviewed today.  It varies from 73-167.  It is lowest in am.   He reports 1 week of slight ulcer at the left foot, and slight assoc pain.  Past Medical History  Diagnosis Date  . CORONARY ARTERY DISEASE 06/13/2007  . DIABETES MELLITUS, TYPE I, WITH OPHTHALMIC COMPLICATIONS 1/94/1740  . HYPERLIPIDEMIA 06/13/2007  . HYPERTENSION 06/13/2007  . ADENOCARCINOMA, PROSTATE 06/01/2008  . NEOPLASM, MALIGNANT, SKIN, FACE 06/01/2008  . HYPERKALEMIA 06/01/2008    due to DM neparopathy  . CVA 06/01/2008    x 2  . Other chronic nonalcoholic liver disease 07/01/4817  . RENAL DISEASE 06/01/2008  . DM retinopathy [facility])     Past Surgical History  Procedure Laterality Date  . Cataract extraction    . Cholecystectomy    . Hernia repair      right Inguinal hernia  . Esophagogastroduodenoscopy  06/28/1991  . Rest/stress cardiolite  01/22/2001  . Carotid duplex  09/12/2004    Social History   Social History  . Marital Status: Married    Spouse Name: N/A  . Number of Children: N/A  . Years of Education: N/A   Occupational History  . Retired    Social History Main Topics  . Smoking status: Former Smoker    Quit date: 11/18/1978  . Smokeless tobacco: Not on file  . Alcohol Use: No  . Drug Use: No  . Sexual Activity: Not on file   Other Topics Concern  . Not on file   Social History Narrative    Current Outpatient Prescriptions on File Prior to Visit  Medication Sig Dispense Refill  .  amLODipine (NORVASC) 2.5 MG tablet TAKE ONE TABLET BY MOUTH ONCE DAILY 30 tablet 5  . aspirin 325 MG tablet Take 325 mg by mouth daily.      . B-D INS SYR ULTRAFINE .3CC/30G 30G X 1/2" 0.3 ML MISC USE AS DIRECTED 4 TIMES DAILY 300 each 1  . Blood Glucose Monitoring Suppl (ONE TOUCH ULTRA 2) W/DEVICE KIT Use to check blood sugar 2 times per day. Dx Code E11.22 1 each 2  . furosemide (LASIX) 20 MG tablet TAKE ONE TABLET BY MOUTH ONCE DAILY 30 tablet 5  . hydrALAZINE (APRESOLINE) 10 MG tablet TAKE ONE TABLET BY MOUTH THREE TIMES DAILY 270 tablet 0  . hydrochlorothiazide (MICROZIDE) 12.5 MG capsule TAKE ONE CAPSULE BY MOUTH ONCE DAILY 30 capsule 5  . ibuprofen (ADVIL,MOTRIN) 200 MG tablet Take 100-200 mg by mouth every 6 (six) hours as needed. For pain    . insulin lispro (HUMALOG) 100 UNIT/ML injection Inject into the skin 3 (three) times daily before meals. 3 times a day (just before each meal), 19-8-14 units    . isosorbide mononitrate (IMDUR) 30 MG 24 hr tablet TAKE ONE TABLET BY MOUTH ONCE DAILY. 30 tablet 0  . Lancets (ONETOUCH ULTRASOFT) lancets USE AS DIRECTED TO CHECK BLOOD SUGAR TWICE DAILY 200 each 0  . metoprolol tartrate (LOPRESSOR) 25 MG tablet TAKE  ONE-HALF TABLET BY MOUTH TWICE DAILY. 60 tablet 0  . nitroGLYCERIN (NITROSTAT) 0.4 MG SL tablet Place 1 tablet (0.4 mg total) under the tongue every 5 (five) minutes as needed. For chest pain 25 tablet 2  . ONE TOUCH ULTRA TEST test strip USE AS DIRECTED TO CHECK BLOOD SUGAR TWICE DAILY 200 each 1  . pravastatin (PRAVACHOL) 80 MG tablet TAKE ONE TABLET BY MOUTH ONCE DAILY 90 tablet 0   No current facility-administered medications on file prior to visit.    No Known Allergies  No family history on file.  BP 128/74 mmHg  Pulse 62  Temp(Src) 98 F (36.7 C)  Resp 16  Ht 6' (1.829 m)  Wt 202 lb 3.2 oz (91.717 kg)  BMI 27.42 kg/m2  SpO2 98%  Review of Systems He denies hypoglycemia.     Objective:   Physical Exam VITAL SIGNS:   See vs page GENERAL: no distress Pulses: dorsalis pedis intact bilat.   MSK: no deformity of the feet  CV: trace bilat leg edema  Skin: there is a 2 cm superficial ulcer at the plantar aspect of the left foot. normal color and temp on the feet.  Neuro: sensation is intact to touch on the feet.  Ext: There is severe bilateral onychomycosis of the toenails  Lab Results  Component Value Date   HGBA1C 8.3 01/09/2016       Assessment & Plan:  DM: The pattern of his cbg's indicates he needs some adjustment in his therapy.   Foot ulcer, new.    Patient is advised the following: Patient Instructions  Please come back for a regular physical appointment in 2 months.  check your blood sugar twice a day.  vary the time of day when you check, between before the 3 meals, and at bedtime.  also check if you have symptoms of your blood sugar being too high or too low.  please keep a record of the readings and bring it to your next appointment here.  You can write it on any piece of paper.  please call us sooner if your blood sugar goes below 70, or if you have a lot of readings over 200.  Please reduce the NPH to 15 units at bedtime, and take humalog 3 times a day (just before each meal), 19-8-14 units. Please keep the foot scrape covered with antibiotic ointment and a large bandaid.  Please call if it gets worse, or is not better in 2 weeks.

## 2016-01-15 ENCOUNTER — Other Ambulatory Visit: Payer: Self-pay | Admitting: Endocrinology

## 2016-01-16 ENCOUNTER — Telehealth: Payer: Self-pay | Admitting: Endocrinology

## 2016-01-16 MED ORDER — FUROSEMIDE 20 MG PO TABS: 20.0000 mg | ORAL_TABLET | Freq: Every day | ORAL | Status: DC

## 2016-01-16 MED ORDER — HYDROCHLOROTHIAZIDE 12.5 MG PO CAPS: 12.5000 mg | ORAL_CAPSULE | Freq: Every day | ORAL | Status: DC

## 2016-01-16 MED ORDER — PRAVASTATIN SODIUM 80 MG PO TABS: 80.0000 mg | ORAL_TABLET | Freq: Every day | ORAL | Status: DC

## 2016-01-16 MED ORDER — AMLODIPINE BESYLATE 2.5 MG PO TABS: 2.5000 mg | ORAL_TABLET | Freq: Every day | ORAL | Status: DC

## 2016-01-16 MED ORDER — ISOSORBIDE MONONITRATE ER 30 MG PO TB24: 30.0000 mg | ORAL_TABLET | Freq: Every day | ORAL | Status: DC

## 2016-01-16 NOTE — Telephone Encounter (Signed)
Rx submitted per pt's request.

## 2016-01-16 NOTE — Telephone Encounter (Signed)
Pt needs [place] to call in to [facility] 5 meds please amlodipine, isosorb, hydrochloride, furosemide, pravastatin

## 2016-01-31 ENCOUNTER — Ambulatory Visit (INDEPENDENT_AMBULATORY_CARE_PROVIDER_SITE_OTHER): Payer: Medicare Other | Admitting: Endocrinology

## 2016-01-31 ENCOUNTER — Encounter: Payer: Self-pay | Admitting: Endocrinology

## 2016-01-31 VITALS — BP 118/64 | HR 93 | Temp 97.7°F | Resp 14 | Wt 203.0 lb

## 2016-01-31 DIAGNOSIS — D61818 Other pancytopenia: Secondary | ICD-10-CM

## 2016-01-31 DIAGNOSIS — Z794 Long term (current) use of insulin: Secondary | ICD-10-CM

## 2016-01-31 DIAGNOSIS — E875 Hyperkalemia: Secondary | ICD-10-CM

## 2016-01-31 DIAGNOSIS — E1122 Type 2 diabetes mellitus with diabetic chronic kidney disease: Secondary | ICD-10-CM

## 2016-01-31 DIAGNOSIS — N183 Chronic kidney disease, stage 3 unspecified: Secondary | ICD-10-CM

## 2016-01-31 DIAGNOSIS — K7581 Nonalcoholic steatohepatitis (NASH): Secondary | ICD-10-CM

## 2016-01-31 DIAGNOSIS — R42 Dizziness and giddiness: Secondary | ICD-10-CM | POA: Insufficient documentation

## 2016-01-31 LAB — BASIC METABOLIC PANEL
BUN: 45 mg/dL — AB (ref 6–23)
CHLORIDE: 100 meq/L (ref 96–112)
CO2: 28 mEq/L (ref 19–32)
Calcium: 9.1 mg/dL (ref 8.4–10.5)
Creatinine, Ser: 1.72 mg/dL — ABNORMAL HIGH (ref 0.40–1.50)
GFR: 39.92 mL/min — AB (ref >60.00)
GLUCOSE: 248 mg/dL — AB (ref 70–99)
POTASSIUM: 4.5 meq/L (ref 3.5–5.1)
Sodium: 135 mEq/L (ref 135–145)

## 2016-01-31 LAB — HEPATIC FUNCTION PANEL
ALBUMIN: 3.9 g/dL (ref 3.5–5.2)
ALK PHOS: 97 U/L (ref 39–117)
ALT: 25 U/L (ref 0–53)
AST: 29 U/L (ref 0–37)
Bilirubin, Direct: 0.1 mg/dL (ref 0.0–0.3)
Total Bilirubin: 0.7 mg/dL (ref 0.2–1.2)
Total Protein: 7.1 g/dL (ref 6.0–8.3)

## 2016-01-31 LAB — CBC WITH DIFFERENTIAL/PLATELET
Basophils Absolute: 0 10*3/uL (ref 0.0–0.1)
Basophils Relative: 0.2 % (ref 0.0–3.0)
EOS PCT: 1.6 % (ref 0.0–5.0)
Eosinophils Absolute: 0.1 10*3/uL (ref 0.0–0.7)
HCT: 36.3 % — ABNORMAL LOW (ref 39.0–52.0)
HEMOGLOBIN: 12.3 g/dL — AB (ref 13.0–17.0)
Lymphocytes Relative: 26.8 % (ref 12.0–46.0)
Lymphs Abs: 1.5 10*3/uL (ref 0.7–4.0)
MCHC: 33.9 g/dL (ref 30.0–36.0)
MCV: 90.8 fl (ref 78.0–100.0)
MONOS PCT: 8.6 % (ref 3.0–12.0)
Monocytes Absolute: 0.5 10*3/uL (ref 0.1–1.0)
Neutro Abs: 3.5 10*3/uL (ref 1.4–7.7)
Neutrophils Relative %: 62.8 % (ref 43.0–77.0)
Platelets: 119 10*3/uL — ABNORMAL LOW (ref 150.0–400.0)
RBC: 4 Mil/uL — ABNORMAL LOW (ref 4.22–5.81)
RDW: 14.2 % (ref 11.5–15.5)
WBC: 5.6 10*3/uL (ref 4.0–10.5)

## 2016-01-31 LAB — LIPID PANEL
CHOLESTEROL: 110 mg/dL (ref 0–200)
HDL: 32.1 mg/dL — ABNORMAL LOW (ref >39.00)
LDL CALC: 47 mg/dL (ref 0–99)
NonHDL: 78.06
TRIGLYCERIDES: 153 mg/dL — AB (ref 0.0–149.0)
Total CHOL/HDL Ratio: 3
VLDL: 30.6 mg/dL (ref 0.0–40.0)

## 2016-01-31 LAB — TSH: TSH: 3.77 u[IU]/mL (ref 0.35–4.50)

## 2016-01-31 MED ORDER — AMLODIPINE BESYLATE 2.5 MG PO TABS: 2.5000 mg | ORAL_TABLET | Freq: Every day | ORAL | Status: DC

## 2016-01-31 NOTE — Patient Instructions (Addendum)
blood tests are requested for you today.  We'll let you know about the results. Please stop taking the hydralazine. Let's check an MRI.  you will receive a phone call, about a day and time for an appointment.  Go to the ER if this gets worse. It is critically important to prevent falling down (keep floor areas well-lit, dry, and free of loose objects.  If you have a cane, walker, or wheelchair, you should use it, even for short trips around the house.  Wear flat-soled shoes.  Also, try not to rush)

## 2016-01-31 NOTE — Progress Notes (Signed)
Subjective:    Patient ID: Chad Mcmillan, male    DOB: 1924-02-18, 82 y.o.   MRN: [id number]  HPI Pt states few days of moderate non-postural dizziness sensation in the head, and slight assoc headache.  He has had this in the past, but it is worse now.   Past Medical History  Diagnosis Date  . CORONARY ARTERY DISEASE 06/13/2007  . DIABETES MELLITUS, TYPE I, WITH OPHTHALMIC COMPLICATIONS 05/09/2978  . HYPERLIPIDEMIA 06/13/2007  . HYPERTENSION 06/13/2007  . ADENOCARCINOMA, PROSTATE 06/01/2008  . NEOPLASM, MALIGNANT, SKIN, FACE 06/01/2008  . HYPERKALEMIA 06/01/2008    due to DM neparopathy  . CVA 06/01/2008    x 2  . Other chronic nonalcoholic liver disease 8/92/1194  . RENAL DISEASE 06/01/2008  . DM retinopathy [facility])     Past Surgical History  Procedure Laterality Date  . Cataract extraction    . Cholecystectomy    . Hernia repair      right Inguinal hernia  . Esophagogastroduodenoscopy  06/28/1991  . Rest/stress cardiolite  01/22/2001  . Carotid duplex  09/12/2004    Social History   Social History  . Marital Status: Married    Spouse Name: N/A  . Number of Children: N/A  . Years of Education: N/A   Occupational History  . Retired    Social History Main Topics  . Smoking status: Former Smoker    Quit date: 11/18/1978  . Smokeless tobacco: Not on file  . Alcohol Use: No  . Drug Use: No  . Sexual Activity: Not on file   Other Topics Concern  . Not on file   Social History Narrative    Current Outpatient Prescriptions on File Prior to Visit  Medication Sig Dispense Refill  . aspirin 325 MG tablet Take 325 mg by mouth daily.      . B-D INS SYR ULTRAFINE .3CC/30G 30G X 1/2" 0.3 ML MISC USE AS DIRECTED 4 TIMES DAILY 300 each 1  . Blood Glucose Monitoring Suppl (ONE TOUCH ULTRA 2) W/DEVICE KIT Use to check blood sugar 2 times per day. Dx Code E11.22 1 each 2  . furosemide (LASIX) 20 MG tablet Take 1 tablet (20 mg total) by mouth daily. 30 tablet 5  .  hydrochlorothiazide (MICROZIDE) 12.5 MG capsule Take 1 capsule (12.5 mg total) by mouth daily. 30 capsule 5  . ibuprofen (ADVIL,MOTRIN) 200 MG tablet Take 100-200 mg by mouth every 6 (six) hours as needed. For pain    . insulin lispro (HUMALOG) 100 UNIT/ML injection Inject into the skin 3 (three) times daily before meals. 3 times a day (just before each meal), 19-8-14 units    . insulin NPH Human (HUMULIN N) 100 UNIT/ML injection Inject 0.15 mLs (15 Units total) into the skin at bedtime. 10 mL 2  . isosorbide mononitrate (IMDUR) 30 MG 24 hr tablet Take 1 tablet (30 mg total) by mouth daily. 30 tablet 5  . Lancets (ONETOUCH ULTRASOFT) lancets USE AS DIRECTED TO CHECK BLOOD SUGAR TWICE DAILY 200 each 0  . metoprolol tartrate (LOPRESSOR) 25 MG tablet TAKE ONE-HALF TABLET BY MOUTH TWICE DAILY. 60 tablet 0  . nitroGLYCERIN (NITROSTAT) 0.4 MG SL tablet Place 1 tablet (0.4 mg total) under the tongue every 5 (five) minutes as needed. For chest pain 25 tablet 2  . ONE TOUCH ULTRA TEST test strip USE AS DIRECTED TO CHECK BLOOD SUGAR TWICE DAILY 200 each 1  . pravastatin (PRAVACHOL) 80 MG tablet Take 1 tablet (80 mg total) by  mouth daily. 30 tablet 5   No current facility-administered medications on file prior to visit.    No Known Allergies  No family history on file.  BP 118/64 mmHg  Pulse 93  Temp(Src) 97.7 F (36.5 C) (Oral)  Resp 14  Wt 203 lb (92.08 kg)  SpO2 97%  Review of Systems Denies LOC, visual loss, and n/v    Objective:   Physical Exam VS: see vs page GEN: no distress HEAD: head: no deformity eyes: no periorbital swelling, no proptosis external nose and ears are normal mouth: no lesion seen Both eac's and tm's are normal.  CV: reg rate and rhythm, no murmur.  MSK: gait is steady with a cane.   PULSES: no carotid bruit NEURO:  cn 2-12 are intact bilaterally.   readily moves all 4's.  sensation is intact to touch on all 4's PSYCH: alert, well-oriented.  Does not appear  anxious nor depressed.   Lab Results  Component Value Date   WBC 5.6 01/31/2016   HGB 12.3* 01/31/2016   HCT 36.3* 01/31/2016   PLT 119.0* 01/31/2016   GLUCOSE 248* 01/31/2016   CHOL 110 01/31/2016   TRIG 153.0* 01/31/2016   HDL 32.10* 01/31/2016   LDLCALC 47 01/31/2016   ALT 25 01/31/2016   AST 29 01/31/2016   NA 135 01/31/2016   K 4.5 01/31/2016   CL 100 01/31/2016   CREATININE 1.72* 01/31/2016   BUN 45* 01/31/2016   CO2 28 01/31/2016   TSH 3.77 01/31/2016   PSA 0.92 09/07/2014   HGBA1C 8.3 01/09/2016   MICROALBUR 4.7* 09/07/2014      Assessment & Plan:  Dizziness, recurrent, uncertain etiology Renal insuff: stable: we'll follow. HTN: slightly overcontrolled.  This could contribute to sxs. Anemia, mild: we'll follow.  Patient is advised the following: Patient Instructions  blood tests are requested for you today.  We'll let you know about the results. Please stop taking the hydralazine. Let's check an MRI.  you will receive a phone call, about a day and time for an appointment.  Go to the ER if this gets worse. It is critically important to prevent falling down (keep floor areas well-lit, dry, and free of loose objects.  If you have a cane, walker, or wheelchair, you should use it, even for short trips around the house.  Wear flat-soled shoes.  Also, try not to rush)

## 2016-02-05 ENCOUNTER — Telehealth: Payer: Self-pay | Admitting: Endocrinology

## 2016-02-05 NOTE — Telephone Encounter (Signed)
I contacted the pt. Pt called wanting to know the status of the MRI referral. I advised the pt I would contact our [facility] to schedule this and if he has not heard back by Friday to call our office and let us know.

## 2016-02-05 NOTE — Telephone Encounter (Signed)
Patient would like for Chad Mcmillan to call him   Call back: [phone]  Thank you

## 2016-02-07 ENCOUNTER — Telehealth: Payer: Self-pay | Admitting: Endocrinology

## 2016-02-07 DIAGNOSIS — R42 Dizziness and giddiness: Secondary | ICD-10-CM

## 2016-02-07 NOTE — Telephone Encounter (Signed)
Noted. [facility] notified.

## 2016-02-07 NOTE — Telephone Encounter (Signed)
i put the order back in for [facility]

## 2016-02-12 ENCOUNTER — Other Ambulatory Visit: Payer: Self-pay | Admitting: Endocrinology

## 2016-02-15 ENCOUNTER — Ambulatory Visit
Admission: RE | Admit: 2016-02-15 | Discharge: 2016-02-15 | Disposition: A | Discharge status: Home / Self Care | Payer: Medicare Other | Source: Ambulatory Visit | Attending: Endocrinology | Admitting: Endocrinology

## 2016-02-15 DIAGNOSIS — R42 Dizziness and giddiness: Secondary | ICD-10-CM

## 2016-02-19 ENCOUNTER — Telehealth: Payer: Self-pay | Admitting: Endocrinology

## 2016-02-19 NOTE — Telephone Encounter (Signed)
I contacted the pt and advised of his MRI report. Pt's message fwd to MD via result notes.

## 2016-02-19 NOTE — Telephone Encounter (Signed)
Patient is returning your call.  

## 2016-03-06 ENCOUNTER — Ambulatory Visit (INDEPENDENT_AMBULATORY_CARE_PROVIDER_SITE_OTHER): Payer: Medicare Other | Admitting: Endocrinology

## 2016-03-06 ENCOUNTER — Encounter: Payer: Self-pay | Admitting: Endocrinology

## 2016-03-06 VITALS — BP 132/70 | HR 60 | Temp 97.6°F | Ht 72.0 in | Wt 199.0 lb

## 2016-03-06 DIAGNOSIS — R200 Anesthesia of skin: Secondary | ICD-10-CM | POA: Diagnosis not present

## 2016-03-06 DIAGNOSIS — Z0000 Encounter for general adult medical examination without abnormal findings: Secondary | ICD-10-CM

## 2016-03-06 LAB — VITAMIN B12: Vitamin B-12: 257 pg/mL (ref 211–911)

## 2016-03-06 NOTE — Progress Notes (Signed)
we discussed code status.  pt requests full code, but would not want to be started or maintained on artificial life-support measures if there was not a reasonable chance of recovery 

## 2016-03-06 NOTE — Progress Notes (Signed)
Subjective:    Patient ID: Chad Mcmillan, male    DOB: 02-04-1924, 82 y.o.   MRN: [id number]  HPI HTN: he stopped ismo, due to dizziness.  sxs then resolved. Past Medical History  Diagnosis Date  . CORONARY ARTERY DISEASE 06/13/2007  . DIABETES MELLITUS, TYPE I, WITH OPHTHALMIC COMPLICATIONS 8/46/6599  . HYPERLIPIDEMIA 06/13/2007  . HYPERTENSION 06/13/2007  . ADENOCARCINOMA, PROSTATE 06/01/2008  . NEOPLASM, MALIGNANT, SKIN, FACE 06/01/2008  . HYPERKALEMIA 06/01/2008    due to DM neparopathy  . CVA 06/01/2008    x 2  . Other chronic nonalcoholic liver disease 3/57/0177  . RENAL DISEASE 06/01/2008  . DM retinopathy [facility])     Past Surgical History  Procedure Laterality Date  . Cataract extraction    . Cholecystectomy    . Hernia repair      right Inguinal hernia  . Esophagogastroduodenoscopy  06/28/1991  . Rest/stress cardiolite  01/22/2001  . Carotid duplex  09/12/2004    Social History   Social History  . Marital Status: Married    Spouse Name: N/A  . Number of Children: N/A  . Years of Education: N/A   Occupational History  . Retired    Social History Main Topics  . Smoking status: Former Smoker    Quit date: 11/18/1978  . Smokeless tobacco: Not on file  . Alcohol Use: No  . Drug Use: No  . Sexual Activity: Not on file   Other Topics Concern  . Not on file   Social History Narrative    Current Outpatient Prescriptions on File Prior to Visit  Medication Sig Dispense Refill  . amLODipine (NORVASC) 2.5 MG tablet Take 1 tablet (2.5 mg total) by mouth daily. 30 tablet 5  . aspirin 325 MG tablet Take 325 mg by mouth daily.      . B-D INS SYR ULTRAFINE .3CC/30G 30G X 1/2" 0.3 ML MISC USE AS DIRECTED 4 TIMES DAILY 300 each 1  . Blood Glucose Monitoring Suppl (ONE TOUCH ULTRA 2) W/DEVICE KIT Use to check blood sugar 2 times per day. Dx Code E11.22 1 each 2  . furosemide (LASIX) 20 MG tablet Take 1 tablet (20 mg total) by mouth daily. 30 tablet 5  . hydrochlorothiazide  (MICROZIDE) 12.5 MG capsule Take 1 capsule (12.5 mg total) by mouth daily. 30 capsule 5  . ibuprofen (ADVIL,MOTRIN) 200 MG tablet Take 100-200 mg by mouth every 6 (six) hours as needed. For pain    . insulin lispro (HUMALOG) 100 UNIT/ML injection Inject into the skin 3 (three) times daily before meals. 3 times a day (just before each meal), 19-8-14 units    . insulin NPH Human (HUMULIN N) 100 UNIT/ML injection Inject 0.15 mLs (15 Units total) into the skin at bedtime. 10 mL 2  . Lancets (ONETOUCH ULTRASOFT) lancets USE AS DIRECTED TO CHECK BLOOD SUGAR TWICE DAILY 200 each 0  . metoprolol tartrate (LOPRESSOR) 25 MG tablet TAKE ONE-HALF TABLET BY MOUTH TWICE DAILY 60 tablet 0  . nitroGLYCERIN (NITROSTAT) 0.4 MG SL tablet Place 1 tablet (0.4 mg total) under the tongue every 5 (five) minutes as needed. For chest pain 25 tablet 2  . ONE TOUCH ULTRA TEST test strip USE AS DIRECTED TO CHECK BLOOD SUGAR TWICE DAILY 200 each 1  . pravastatin (PRAVACHOL) 80 MG tablet Take 1 tablet (80 mg total) by mouth daily. 30 tablet 5   No current facility-administered medications on file prior to visit.    No Known Allergies  No family history on file.  BP 132/70 mmHg  Pulse 60  Temp(Src) 97.6 F (36.4 C) (Oral)  Ht 6' (1.829 m)  Wt 199 lb (90.266 kg)  BMI 26.98 kg/m2  SpO2 97%    Review of Systems He has slight numbness of the feet.      Objective:   Physical Exam VS: see vs page GEN: no distress.   NECK: supple, thyroid is not enlarged CHEST WALL: no deformity LUNGS: clear to auscultation BREASTS:  No gynecomastia CV: reg rate and rhythm, no murmur MUSCULOSKELETAL: muscle bulk and strength are grossly normal.  no obvious joint swelling.  gait is steady, with a cane.  EXTEMITIES: no deformity.  no ulcer on the feet.  feet are of normal color and temp.  no edema, but there are bilat varicosities of the feet.  There is severe bilateral onychomycosis of the toenails PULSES: dorsalis pedis intact  bilat.  no carotid bruit NEURO:  cn 2-12 grossly intact.   readily moves all 4's.  sensation is intact to touch on the feet SKIN:  Normal texture and temperature.  No rash or suspicious lesion is visible.   NODES:  None palpable at the neck PSYCH: alert, well-oriented.  Does not appear anxious nor depressed.     Lab Results  Component Value Date   WBC 5.6 01/31/2016   HGB 12.3* 01/31/2016   HCT 36.3* 01/31/2016   PLT 119.0* 01/31/2016   GLUCOSE 248* 01/31/2016   CHOL 110 01/31/2016   TRIG 153.0* 01/31/2016   HDL 32.10* 01/31/2016   LDLCALC 47 01/31/2016   ALT 25 01/31/2016   AST 29 01/31/2016   NA 135 01/31/2016   K 4.5 01/31/2016   CL 100 01/31/2016   CREATININE 1.72* 01/31/2016   BUN 45* 01/31/2016   CO2 28 01/31/2016   TSH 3.77 01/31/2016   PSA 0.92 09/07/2014   HGBA1C 8.3 01/09/2016   MICROALBUR 4.7* 09/07/2014      Assessment & Plan:  HTN: well-controlled.  Please continue the same medication Renal failure: stable: we'll follow.  Anemia: further w/u of no value at this age, so we'll follow.    Subjective:   Patient here for Medicare annual wellness visit and management of other chronic and acute problems.     Risk factors: advanced age    67 of Physicians Providing Medical Care to Patient:  See "snapshot"   Activities of Daily Living: In your present state of health, do you have any difficulty performing the following activities?:  Preparing food and eating?: No  Bathing yourself: No  Getting dressed: No  Using the toilet:No  Moving around from place to place: No  In the past year have you fallen or had a near fall?: No    Home Safety: Has smoke detector and wears seat belts. Firearms are safely stored. No excess sun exposure.   Diet and Exercise  Current exercise habits: limited by health problems Dietary issues discussed: pt reports a healthy diet   Depression Screen  Q1: Over the past two weeks, have you felt down, depressed or hopeless? no    Q2: Over the past two weeks, have you felt little interest or pleasure in doing things? no   The following portions of the patient's history were reviewed and updated as appropriate: allergies, current medications, past family history, past medical history, past social history, past surgical history and problem list.   Review of Systems  Denies hearing loss, and visual loss Objective:   Vision:  Sees opthalmologist dr Zigmund Daniel.  Pt declines to recheck today.  Hearing: grossly normal Body mass index:  See vs page Msk: pt slowly performs "get-up-and-go" from a sitting position. Cognitive Impairment Assessment: cognition, memory and judgment appear normal.  remembers 3/3 at 5 minutes.  excellent recall.  can easily read and write a sentence.  alert and oriented x 3 (except he says it is 02/20/16).     Assessment:   Medicare wellness utd on preventive parameters.     Plan:   During the course of the visit the patient was educated and counseled about appropriate screening and preventive services including:        Fall prevention   Diabetes screening  Nutrition counseling   Vaccines / LABS Zostavax / Pneumococcal Vaccine  today  PSA is deferred due to age  Patient Instructions (the written plan) was given to the patient.

## 2016-03-06 NOTE — Patient Instructions (Addendum)
blood tests are requested for you today.  We'll let you know about the results. good diet and exercise significantly improve the control of your diabetes.  please let me know if you wish to be referred to a dietician.  high blood sugar is very risky to your health.  you should see an eye doctor and dentist every year.  It is very important to get all recommended vaccinations.   please consider these measures for your health:  minimize alcohol.  do not use tobacco products.  have a colonoscopy at least every 10 years from age 9.  keep firearms safely stored.  always use seat belts.  have working smoke alarms in your home.  see an eye doctor and dentist regularly.  never drive under the influence of alcohol or drugs (including prescription drugs).  those with fair skin should take precautions against the sun. it is critically important to prevent falling down (keep floor areas well-lit, dry, and free of loose objects.  If you have a cane, walker, or wheelchair, you should use it, even for short trips around the house.  Wear flat-soled shoes.  Also, try not to rush) Please come back for a follow-up appointment in 3 months.

## 2016-04-15 ENCOUNTER — Other Ambulatory Visit: Payer: Self-pay | Admitting: Endocrinology

## 2016-04-23 ENCOUNTER — Ambulatory Visit (INDEPENDENT_AMBULATORY_CARE_PROVIDER_SITE_OTHER): Payer: Medicare Other | Admitting: Endocrinology

## 2016-04-23 ENCOUNTER — Ambulatory Visit
Admission: RE | Admit: 2016-04-23 | Discharge: 2016-04-23 | Disposition: A | Discharge status: Home / Self Care | Payer: Medicare Other | Source: Ambulatory Visit | Attending: Endocrinology | Admitting: Endocrinology

## 2016-04-23 ENCOUNTER — Encounter: Payer: Self-pay | Admitting: Endocrinology

## 2016-04-23 VITALS — BP 128/64 | HR 80 | Temp 97.6°F | Ht 72.0 in | Wt 191.0 lb

## 2016-04-23 DIAGNOSIS — N183 Chronic kidney disease, stage 3 unspecified: Secondary | ICD-10-CM

## 2016-04-23 DIAGNOSIS — R05 Cough: Secondary | ICD-10-CM

## 2016-04-23 DIAGNOSIS — R059 Cough, unspecified: Secondary | ICD-10-CM

## 2016-04-23 DIAGNOSIS — Z794 Long term (current) use of insulin: Secondary | ICD-10-CM

## 2016-04-23 DIAGNOSIS — E1122 Type 2 diabetes mellitus with diabetic chronic kidney disease: Secondary | ICD-10-CM

## 2016-04-23 LAB — POCT GLYCOSYLATED HEMOGLOBIN (HGB A1C): HEMOGLOBIN A1C: 8.7

## 2016-04-23 MED ORDER — FLUTICASONE-SALMETEROL 100-50 MCG/DOSE IN AEPB: 1.0000 | INHALATION_SPRAY | Freq: Two times a day (BID) | RESPIRATORY_TRACT | Status: DC

## 2016-04-23 MED ORDER — PROMETHAZINE-CODEINE 6.25-10 MG/5ML PO SYRP: 5.0000 mL | ORAL_SOLUTION | ORAL | Status: DC | PRN

## 2016-04-23 MED ORDER — AZITHROMYCIN 500 MG PO TABS: 500.0000 mg | ORAL_TABLET | Freq: Every day | ORAL | Status: DC

## 2016-04-23 NOTE — Progress Notes (Signed)
Subjective:    Patient ID: Chad Mcmillan, male    DOB: 09-14-1924, 82 y.o.   MRN: [id number]  HPI  Pt returns for f/u of diabetes mellitus:  DM type: Insulin-requiring type 2 Dx'ed: 3545 Complications: renal insuff, CAD, retinopathy, polyneuropathy, and CVA.   Therapy: insulin since 2004.   DKA: never.  Severe hypoglycemia: never.  Pancreatitis: never.  Other: he takes multiple daily injections; he prefers human insulin due to cost.  Interval history: no cbg record, but states cbg's vary from 90-250.  It is in general higher as the day goes on.   Pt states 2 weeks of moderate prod-quality cough in the chest, and assoc wheezing.   Past Medical History  Diagnosis Date  . CORONARY ARTERY DISEASE 06/13/2007  . DIABETES MELLITUS, TYPE I, WITH OPHTHALMIC COMPLICATIONS 05/12/6388  . HYPERLIPIDEMIA 06/13/2007  . HYPERTENSION 06/13/2007  . ADENOCARCINOMA, PROSTATE 06/01/2008  . NEOPLASM, MALIGNANT, SKIN, FACE 06/01/2008  . HYPERKALEMIA 06/01/2008    due to DM neparopathy  . CVA 06/01/2008    x 2  . Other chronic nonalcoholic liver disease 3/73/4287  . RENAL DISEASE 06/01/2008  . DM retinopathy [facility])     Past Surgical History  Procedure Laterality Date  . Cataract extraction    . Cholecystectomy    . Hernia repair      right Inguinal hernia  . Esophagogastroduodenoscopy  06/28/1991  . Rest/stress cardiolite  01/22/2001  . Carotid duplex  09/12/2004    Social History   Social History  . Marital Status: Married    Spouse Name: N/A  . Number of Children: N/A  . Years of Education: N/A   Occupational History  . Retired    Social History Main Topics  . Smoking status: Former Smoker    Quit date: 11/18/1978  . Smokeless tobacco: Not on file  . Alcohol Use: No  . Drug Use: No  . Sexual Activity: Not on file   Other Topics Concern  . Not on file   Social History Narrative    Current Outpatient Prescriptions on File Prior to Visit  Medication Sig Dispense Refill  .  amLODipine (NORVASC) 2.5 MG tablet Take 1 tablet (2.5 mg total) by mouth daily. 30 tablet 5  . aspirin 325 MG tablet Take 325 mg by mouth daily.      . B-D INS SYR ULTRAFINE .3CC/30G 30G X 1/2" 0.3 ML MISC USE AS DIRECTED 4 TIMES DAILY 300 each 1  . Blood Glucose Monitoring Suppl (ONE TOUCH ULTRA 2) W/DEVICE KIT Use to check blood sugar 2 times per day. Dx Code E11.22 1 each 2  . furosemide (LASIX) 20 MG tablet Take 1 tablet (20 mg total) by mouth daily. 30 tablet 5  . hydrochlorothiazide (MICROZIDE) 12.5 MG capsule Take 1 capsule (12.5 mg total) by mouth daily. 30 capsule 5  . ibuprofen (ADVIL,MOTRIN) 200 MG tablet Take 100-200 mg by mouth every 6 (six) hours as needed. For pain    . insulin lispro (HUMALOG) 100 UNIT/ML injection Inject into the skin 3 (three) times daily before meals. 3 times a day (just before each meal), 22-10-16 units    . insulin NPH Human (HUMULIN N) 100 UNIT/ML injection Inject 0.15 mLs (15 Units total) into the skin at bedtime. 10 mL 2  . Lancets (ONETOUCH ULTRASOFT) lancets USE AS DIRECTED TO CHECK BLOOD SUGAR TWICE DAILY 200 each 0  . metoprolol tartrate (LOPRESSOR) 25 MG tablet TAKE ONE-HALF TABLET BY MOUTH TWICE DAILY 60 tablet 0  .  nitroGLYCERIN (NITROSTAT) 0.4 MG SL tablet Place 1 tablet (0.4 mg total) under the tongue every 5 (five) minutes as needed. For chest pain 25 tablet 2  . ONE TOUCH ULTRA TEST test strip USE AS DIRECTED TO CHECK BLOOD SUGAR TWICE DAILY 200 each 1  . pravastatin (PRAVACHOL) 80 MG tablet Take 1 tablet (80 mg total) by mouth daily. 30 tablet 5   No current facility-administered medications on file prior to visit.    No Known Allergies  No family history on file.  BP 128/64 mmHg  Pulse 80  Temp(Src) 97.6 F (36.4 C)  Ht 6' (1.829 m)  Wt 191 lb (86.637 kg)  BMI 25.90 kg/m2  SpO2 97%   Review of Systems Denies sob and fever.    Objective:   Physical Exam VITAL SIGNS:  See vs page GENERAL: no distress head: no deformity eyes:  no periorbital swelling, no proptosis external nose and ears are normal mouth: no lesion seen Both eac's and tm's are normal LUNGS:  Clear to auscultation   A1c=8.7%     Assessment & Plan:  Acute bronchitis, new Insulin-requiring type 2 DM: he needs increased rx.  Patient is advised the following: Patient Instructions  Please increase the humalog to 3 times a day (just before each meal), 22-10-16 units Please continue the same NPH at bedtime.  check your blood sugar twice a day.  vary the time of day when you check, between before the 3 meals, and at bedtime.  also check if you have symptoms of your blood sugar being too high or too low.  please keep a record of the readings and bring it to your next appointment here (or you can bring the meter itself).  You can write it on any piece of paper.  please call us sooner if your blood sugar goes below 70, or if you have a lot of readings over 200. A chest x-ray is requested for you today.  We'll let you know about the results. i have sent 2 prescription to your pharmacy: antibiotic and inhaler.  Here is a prescription for cough syrup.  I hope you feel better soon.  If you don't feel better by next week, please call back.  Please call sooner if you get worse. Please come back for a follow-up appointment in 3 months.    [name], MD

## 2016-04-23 NOTE — Patient Instructions (Addendum)
Please increase the humalog to 3 times a day (just before each meal), 22-10-16 units Please continue the same NPH at bedtime.  check your blood sugar twice a day.  vary the time of day when you check, between before the 3 meals, and at bedtime.  also check if you have symptoms of your blood sugar being too high or too low.  please keep a record of the readings and bring it to your next appointment here (or you can bring the meter itself).  You can write it on any piece of paper.  please call us sooner if your blood sugar goes below 70, or if you have a lot of readings over 200. A chest x-ray is requested for you today.  We'll let you know about the results. i have sent 2 prescription to your pharmacy: antibiotic and inhaler.  Here is a prescription for cough syrup.  I hope you feel better soon.  If you don't feel better by next week, please call back.  Please call sooner if you get worse. Please come back for a follow-up appointment in 3 months.

## 2016-04-26 ENCOUNTER — Telehealth: Payer: Self-pay | Admitting: Endocrinology

## 2016-04-26 NOTE — Telephone Encounter (Signed)
Go to ER now

## 2016-04-26 NOTE — Telephone Encounter (Signed)
See note below and please advise, Thanks! 

## 2016-04-26 NOTE — Telephone Encounter (Signed)
Patient son stated that his dad is getting worse, he is full of fluids, coughing and wore out, please advise

## 2016-04-26 NOTE — Telephone Encounter (Signed)
I contacted the pt and advised of instructions below. Pt is going to call and discuss this with his son and then decide on going to the hospital.

## 2016-05-14 ENCOUNTER — Other Ambulatory Visit: Payer: Self-pay | Admitting: Endocrinology

## 2016-05-15 ENCOUNTER — Other Ambulatory Visit: Payer: Self-pay | Admitting: Endocrinology

## 2016-05-15 NOTE — Telephone Encounter (Signed)
Pt needs refill on humalog please asap he is out

## 2016-05-16 ENCOUNTER — Other Ambulatory Visit: Payer: Self-pay

## 2016-05-16 MED ORDER — INSULIN LISPRO 100 UNIT/ML ~~LOC~~ SOLN: SUBCUTANEOUS | Status: DC

## 2016-05-16 NOTE — Telephone Encounter (Signed)
Please refill.

## 2016-06-05 ENCOUNTER — Ambulatory Visit: Payer: Medicare Other | Admitting: Endocrinology

## 2016-06-12 ENCOUNTER — Other Ambulatory Visit: Payer: Self-pay | Admitting: Endocrinology

## 2016-07-08 ENCOUNTER — Ambulatory Visit (INDEPENDENT_AMBULATORY_CARE_PROVIDER_SITE_OTHER): Payer: Medicare Other | Admitting: Internal Medicine

## 2016-07-08 ENCOUNTER — Encounter: Payer: Self-pay | Admitting: Internal Medicine

## 2016-07-08 VITALS — BP 156/76 | HR 60 | Ht 70.0 in | Wt 199.8 lb

## 2016-07-08 DIAGNOSIS — I10 Essential (primary) hypertension: Secondary | ICD-10-CM | POA: Diagnosis not present

## 2016-07-08 NOTE — Progress Notes (Signed)
Cardiology Office Note   Date:  07/08/2016   ID:  Chad Mcmillan, DOB 06/29/1924, MRN [id number]  PCP:  [name], MD  Cardiologist:   Dorris Carnes, MD   F/U of CAD      History of Present Illness: Chad Mcmillan is a 82 y.o. male with a history of CAD  Last cath in 2000. (LAD 50%, 70%, Diag 90% LCX 70 to 80%. LVEF 70. He had a myoview in Fall 2010.  I saw him in Aug of last year.  Since seen he denies CP  Breathing is stable  No change in activity level  I saw him in Sept 2016 Feeling lpretty good  NO CP  Breathing is OK  No dizziness now.  Some numbness in feet.  Followed byS Loanne Drilling       Outpatient Medications Prior to Visit  Medication Sig Dispense Refill  . amLODipine (NORVASC) 2.5 MG tablet Take 1 tablet (2.5 mg total) by mouth daily. 30 tablet 5  . aspirin 325 MG tablet Take 325 mg by mouth daily.      Marland Kitchen azithromycin (ZITHROMAX) 500 MG tablet Take 1 tablet (500 mg total) by mouth daily. 5 tablet 0  . B-D INS SYR ULTRAFINE .3CC/30G 30G X 1/2" 0.3 ML MISC USE AS DIRECTED 4 TIMES DAILY 300 each 1  . Blood Glucose Monitoring Suppl (ONE TOUCH ULTRA 2) W/DEVICE KIT Use to check blood sugar 2 times per day. Dx Code E11.22 1 each 2  . Fluticasone-Salmeterol (ADVAIR) 100-50 MCG/DOSE AEPB Inhale 1 puff into the lungs 2 (two) times daily. 1 each 3  . furosemide (LASIX) 20 MG tablet Take 1 tablet (20 mg total) by mouth daily. 30 tablet 5  . hydrALAZINE (APRESOLINE) 10 MG tablet TAKE ONE TABLET BY MOUTH THREE TIMES DAILY 270 tablet 0  . hydrochlorothiazide (MICROZIDE) 12.5 MG capsule Take 1 capsule (12.5 mg total) by mouth daily. 30 capsule 5  . ibuprofen (ADVIL,MOTRIN) 200 MG tablet Take 100-200 mg by mouth every 6 (six) hours as needed. For pain    . insulin lispro (HUMALOG) 100 UNIT/ML injection Inject into the skin 3 (three) times daily before meals. 3 times a day (just before each meal), 22-10-16 units    . insulin lispro (HUMALOG) 100 UNIT/ML injection INJECT 18 UNITS  SUBCUTANEOUSLY WITH BREAKFAST AND 8 UNITS WITH LUNCH AND 13 UNITS WITH THE EVENING MEAL 20 mL 1  . insulin NPH Human (HUMULIN N) 100 UNIT/ML injection Inject 0.15 mLs (15 Units total) into the skin at bedtime. 10 mL 2  . Lancets (ONETOUCH ULTRASOFT) lancets USE AS DIRECTED TO CHECK BLOOD SUGAR TWICE DAILY 200 each 0  . metoprolol tartrate (LOPRESSOR) 25 MG tablet TAKE ONE-HALF TABLET BY MOUTH TWICE DAILY 60 tablet 0  . nitroGLYCERIN (NITROSTAT) 0.4 MG SL tablet Place 1 tablet (0.4 mg total) under the tongue every 5 (five) minutes as needed. For chest pain 25 tablet 2  . ONE TOUCH ULTRA TEST test strip USE AS DIRECTED TO CHECK BLOOD SUGAR TWICE DAILY 200 each 1  . pravastatin (PRAVACHOL) 80 MG tablet Take 1 tablet (80 mg total) by mouth daily. 30 tablet 5  . promethazine-codeine (PHENERGAN WITH CODEINE) 6.25-10 MG/5ML syrup Take 5 mLs by mouth every 4 (four) hours as needed. 240 mL 0   No facility-administered medications prior to visit.      Allergies:   Review of patient's allergies indicates no known allergies.   Past Medical History:  Diagnosis Date  .  ADENOCARCINOMA, PROSTATE 06/01/2008  . CORONARY ARTERY DISEASE 06/13/2007  . CVA 06/01/2008   x 2  . DIABETES MELLITUS, TYPE I, WITH OPHTHALMIC COMPLICATIONS 12/18/8655  . 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 HYPERKALEMIA 06/01/2008   due to DM neparopathy  . HYPERLIPIDEMIA 06/13/2007  . HYPERTENSION 06/13/2007  . NEOPLASM, MALIGNANT, SKIN, FACE 06/01/2008  . Other chronic nonalcoholic liver disease 8/46/9629  . RENAL DISEASE 06/01/2008    Past Surgical History:  Procedure Laterality Date  . Carotid Duplex  09/12/2004  . CATARACT EXTRACTION    . CHOLECYSTECTOMY    . ESOPHAGOGASTRODUODENOSCOPY  06/28/1991  . HERNIA REPAIR     right Inguinal hernia  . Rest/Stress Cardiolite  01/22/2001     Social History:  The patient  reports that he quit smoking about 37 years ago. He has never used smokeless tobacco. He reports that he does not drink alcohol  or use drugs.   Family History:  The patient's family history is not on file.    ROS:  Please see the history of present illness. All other systems are reviewed and  Negative to the above problem except as noted.    PHYSICAL EXAM: VS:  BP (!) 156/76   Pulse 60   Ht 5' 10"  (1.778 m)   Wt 199 lb 12.8 oz (90.6 kg)   SpO2 98%   BMI 28.67 kg/m   GEN: Well nourished, well developed, in no acute distress  HEENT: normal  Neck: no JVD, carotid bruits, or masses Cardiac: RRR; no murmurs, rubs, or gallops,no edema  Respiratory:  clear to auscultation bilaterally, normal work of breathing GI: soft, nontender, nondistended, + BS  No hepatomegaly  MS: no deformity Moving all extremities   Skin: warm and dry, no rash Neuro:  Strength and sensation are intact Psych: euthymic mood, full affect   EKG:  EKG is ordered today.  SR 60  First degree AV block   PR 294 msecd    Lipid Panel    Component Value Date/Time   CHOL 110 01/31/2016 1347   TRIG 153.0 (H) 01/31/2016 1347   TRIG 51 09/29/2006 0801   HDL 32.10 (L) 01/31/2016 1347   CHOLHDL 3 01/31/2016 1347   VLDL 30.6 01/31/2016 1347   LDLCALC 47 01/31/2016 1347      Wt Readings from Last 3 Encounters:  07/08/16 199 lb 12.8 oz (90.6 kg)  04/23/16 191 lb (86.6 kg)  03/06/16 199 lb (90.3 kg)      ASSESSMENT AND PLAN:  1  CAD   Pt doing OK  I am not convinced of any active angina  2.  HL  Keep on statin  F/U in 1 year     Signed, Dorris Carnes, MD  07/08/2016 3:54 PM    [address], [place], [place]  [address] Phone: [phone]; Fax: [phone]

## 2016-07-08 NOTE — Patient Instructions (Signed)
Your physician recommends that you continue on your current medications as directed. Please refer to the Current Medication list given to you today. Your physician wants you to follow-up in: 1 year with Dr. Ross.  You will receive a reminder letter in the mail two months in advance. If you don't receive a letter, please call our office to schedule the follow-up appointment.  

## 2016-07-15 ENCOUNTER — Other Ambulatory Visit: Payer: Self-pay | Admitting: Endocrinology

## 2016-07-16 ENCOUNTER — Telehealth: Payer: Self-pay | Admitting: Endocrinology

## 2016-07-16 NOTE — Telephone Encounter (Signed)
Left message on vm at pharmacy

## 2016-07-16 NOTE — Telephone Encounter (Signed)
E11.9

## 2016-07-16 NOTE — Telephone Encounter (Signed)
Patient's pharmacy at [place] [facility] said they need the correct diagnosis code for his One Touch lancets and strips prescription.

## 2016-07-17 ENCOUNTER — Telehealth: Payer: Self-pay | Admitting: Endocrinology

## 2016-07-17 MED ORDER — GLUCOSE BLOOD VI STRP: ORAL_STRIP | 10 refills | Status: DC

## 2016-07-17 MED ORDER — ONETOUCH DELICA LANCETS 33G MISC: 13 refills | Status: DC

## 2016-07-17 NOTE — Telephone Encounter (Signed)
Please resend the rx with the diagnosis code for the lancets and strips

## 2016-07-17 NOTE — Telephone Encounter (Signed)
Prescriptions resubmitted .

## 2016-07-19 ENCOUNTER — Other Ambulatory Visit: Payer: Self-pay

## 2016-07-19 MED ORDER — GLUCOSE BLOOD VI STRP: ORAL_STRIP | 4 refills | Status: DC

## 2016-07-25 ENCOUNTER — Ambulatory Visit (INDEPENDENT_AMBULATORY_CARE_PROVIDER_SITE_OTHER): Payer: Medicare Other

## 2016-07-25 DIAGNOSIS — Z23 Encounter for immunization: Secondary | ICD-10-CM

## 2016-08-13 ENCOUNTER — Other Ambulatory Visit: Payer: Self-pay | Admitting: Endocrinology

## 2016-09-04 ENCOUNTER — Ambulatory Visit: Payer: Medicare Other | Admitting: Endocrinology

## 2016-09-10 ENCOUNTER — Encounter: Payer: Self-pay | Admitting: Endocrinology

## 2016-09-10 ENCOUNTER — Ambulatory Visit (INDEPENDENT_AMBULATORY_CARE_PROVIDER_SITE_OTHER): Payer: Medicare Other | Admitting: Endocrinology

## 2016-09-10 DIAGNOSIS — E1122 Type 2 diabetes mellitus with diabetic chronic kidney disease: Secondary | ICD-10-CM | POA: Diagnosis not present

## 2016-09-10 DIAGNOSIS — Z794 Long term (current) use of insulin: Secondary | ICD-10-CM

## 2016-09-10 DIAGNOSIS — N183 Chronic kidney disease, stage 3 (moderate): Secondary | ICD-10-CM | POA: Diagnosis not present

## 2016-09-10 LAB — POCT GLYCOSYLATED HEMOGLOBIN (HGB A1C): HEMOGLOBIN A1C: 6.8

## 2016-09-10 MED ORDER — INSULIN LISPRO 100 UNIT/ML ~~LOC~~ SOLN: SUBCUTANEOUS | 1 refills | Status: DC

## 2016-09-10 NOTE — Progress Notes (Signed)
Subjective:    Patient ID: Chad Mcmillan, male    DOB: [birth year], 82 y.o.   MRN: [id number]  HPI Pt returns for f/u of diabetes mellitus:  DM type: Insulin-requiring type 2 Dx'ed: 1989.   Complications: renal insuff, CAD, retinopathy, polyneuropathy, and CVA.   Therapy: insulin since 2004.  DKA: never.  Severe hypoglycemia: never.  Pancreatitis: never.  Other: he takes multiple daily injections; he prefers human insulin due to cost.   Interval history: no cbg record, but states cbg's are well-controlled.  There is no trend throughout the day, except is is lowest at HS.   Past Medical History:  Diagnosis Date  . ADENOCARCINOMA, PROSTATE 06/01/2008  . CORONARY ARTERY DISEASE 06/13/2007  . CVA 06/01/2008   x 2  . DIABETES MELLITUS, TYPE I, WITH OPHTHALMIC COMPLICATIONS 8/56/3149  . 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 HYPERKALEMIA 06/01/2008   due to DM neparopathy  . HYPERLIPIDEMIA 06/13/2007  . HYPERTENSION 06/13/2007  . NEOPLASM, MALIGNANT, SKIN, FACE 06/01/2008  . Other chronic nonalcoholic liver disease 05/19/6377  . RENAL DISEASE 06/01/2008    Past Surgical History:  Procedure Laterality Date  . Carotid Duplex  09/12/2004  . CATARACT EXTRACTION    . CHOLECYSTECTOMY    . ESOPHAGOGASTRODUODENOSCOPY  06/28/1991  . HERNIA REPAIR     right Inguinal hernia  . Rest/Stress Cardiolite  01/22/2001    Social History   Social History  . Marital status: Married    Spouse name: N/A  . Number of children: N/A  . Years of education: N/A   Occupational History  . Retired Retired   Social History Main Topics  . Smoking status: Former Smoker    Quit date: 11/18/1978  . Smokeless tobacco: Never Used  . Alcohol use No  . Drug use: No  . Sexual activity: No   Other Topics Concern  . Not on file   Social History Narrative  . No narrative on file    Current Outpatient Prescriptions on File Prior to Visit  Medication Sig Dispense Refill  . amLODipine (NORVASC) 2.5 MG tablet TAKE ONE TABLET  BY MOUTH DAILY. 30 tablet 5  . aspirin 325 MG tablet Take 325 mg by mouth daily.      . B-D INS SYR ULTRAFINE .3CC/30G 30G X 1/2" 0.3 ML MISC USE AS DIRECTED 4 TIMES DAILY 300 each 1  . Blood Glucose Monitoring Suppl (ONE TOUCH ULTRA 2) W/DEVICE KIT Use to check blood sugar 2 times per day. Dx Code E11.22 1 each 2  . Fluticasone-Salmeterol (ADVAIR) 100-50 MCG/DOSE AEPB Inhale 1 puff into the lungs 2 (two) times daily. (Patient taking differently: Inhale 1 puff into the lungs as needed. ) 1 each 3  . furosemide (LASIX) 20 MG tablet TAKE ONE TABLET BY MOUTH DAILY 30 tablet 5  . glucose blood (ONE TOUCH ULTRA TEST) test strip USE  STRIP TO CHECK GLUCOSE TWICE DAILY AS DIRECTED Dx code: E11.9 100 each 4  . hydrALAZINE (APRESOLINE) 10 MG tablet TAKE ONE TABLET BY MOUTH THREE TIMES DAILY 270 tablet 0  . hydrochlorothiazide (MICROZIDE) 12.5 MG capsule Take 1 capsule (12.5 mg total) by mouth daily. 30 capsule 5  . ibuprofen (ADVIL,MOTRIN) 200 MG tablet Take 100-200 mg by mouth every 6 (six) hours as needed. For pain    . insulin NPH Human (HUMULIN N) 100 UNIT/ML injection Inject 0.15 mLs (15 Units total) into the skin at bedtime. 10 mL 2  . metoprolol tartrate (LOPRESSOR) 25 MG tablet TAKE ONE-HALF  TABLET BY MOUTH TWICE DAILY 60 tablet 0  . nitroGLYCERIN (NITROSTAT) 0.4 MG SL tablet Place 1 tablet (0.4 mg total) under the tongue every 5 (five) minutes as needed. For chest pain 25 tablet 2  . ONETOUCH DELICA LANCETS 70W MISC USE  TO CHECK GLUCOSE TWICE DAILY AS DIRECTED E11.9 100 each 13  . pravastatin (PRAVACHOL) 80 MG tablet TAKE ONE TABLET BY MOUTH DAILY 30 tablet 5  . promethazine-codeine (PHENERGAN WITH CODEINE) 6.25-10 MG/5ML syrup Take 5 mLs by mouth every 4 (four) hours as needed. 240 mL 0   No current facility-administered medications on file prior to visit.     No Known Allergies  No family history on file.  BP 134/74   Pulse 84   Ht 6' (1.829 m)   Wt 197 lb (89.4 kg)   SpO2 97%   BMI  26.72 kg/m    Review of Systems He denies hypoglycemia.      Objective:   Physical Exam VITAL SIGNS:  See vs page GENERAL: no distress Pulses: dorsalis pedis intact bilat.   MSK: no deformity of the feet CV: no leg edema, but there are bilat vv's Skin:  no ulcer on the feet.  normal color and temp on the feet. Neuro: sensation is intact to touch on the feet Ext: There is bilateral onychomycosis of the toenails.    A1c=6.8%    Assessment & Plan:  Insulin-requiring type 2 DM, with CVA: overcontrolled.   Patient is advised the following: Patient Instructions  Please reduce the supper insulin to 10 units. Please continue the same other insulin shots.  check your blood sugar twice a day.  vary the time of day when you check, between before the 3 meals, and at bedtime.  also check if you have symptoms of your blood sugar being too high or too low.  please keep a record of the readings and bring it to your next appointment here (or you can bring the meter itself).  You can write it on any piece of paper.  please call us sooner if your blood sugar goes below 70, or if you have a lot of readings over 200.   Please come back for a follow-up appointment in 4 months.

## 2016-09-10 NOTE — Patient Instructions (Addendum)
Please reduce the supper insulin to 10 units. Please continue the same other insulin shots.  check your blood sugar twice a day.  vary the time of day when you check, between before the 3 meals, and at bedtime.  also check if you have symptoms of your blood sugar being too high or too low.  please keep a record of the readings and bring it to your next appointment here (or you can bring the meter itself).  You can write it on any piece of paper.  please call us sooner if your blood sugar goes below 70, or if you have a lot of readings over 200.   Please come back for a follow-up appointment in 4 months.

## 2016-10-14 ENCOUNTER — Other Ambulatory Visit: Payer: Self-pay | Admitting: Endocrinology

## 2016-11-08 ENCOUNTER — Other Ambulatory Visit: Payer: Self-pay | Admitting: Endocrinology

## 2016-12-09 ENCOUNTER — Ambulatory Visit (INDEPENDENT_AMBULATORY_CARE_PROVIDER_SITE_OTHER): Payer: Medicare Other | Admitting: Ophthalmology

## 2017-01-08 ENCOUNTER — Encounter: Payer: Self-pay | Admitting: Endocrinology

## 2017-01-08 ENCOUNTER — Ambulatory Visit (INDEPENDENT_AMBULATORY_CARE_PROVIDER_SITE_OTHER): Payer: Medicare Other | Admitting: Endocrinology

## 2017-01-08 VITALS — BP 110/70 | HR 67 | Ht 72.0 in | Wt 193.0 lb

## 2017-01-08 DIAGNOSIS — N183 Chronic kidney disease, stage 3 (moderate): Secondary | ICD-10-CM

## 2017-01-08 DIAGNOSIS — Z794 Long term (current) use of insulin: Secondary | ICD-10-CM | POA: Diagnosis not present

## 2017-01-08 DIAGNOSIS — E1122 Type 2 diabetes mellitus with diabetic chronic kidney disease: Secondary | ICD-10-CM

## 2017-01-08 LAB — POCT GLYCOSYLATED HEMOGLOBIN (HGB A1C): Hemoglobin A1C: 6.9

## 2017-01-08 MED ORDER — INSULIN NPH (HUMAN) (ISOPHANE) 100 UNIT/ML ~~LOC~~ SUSP: 5.0000 [IU] | Freq: Every day | SUBCUTANEOUS | 2 refills | Status: DC

## 2017-01-08 MED ORDER — INSULIN NPH (HUMAN) (ISOPHANE) 100 UNIT/ML ~~LOC~~ SUSP: 10.0000 [IU] | Freq: Every day | SUBCUTANEOUS | 2 refills | Status: DC

## 2017-01-08 NOTE — Patient Instructions (Addendum)
Please reduce the bedtime NPH insulin to 10 units. Please continue the same other insulin shots.  check your blood sugar twice a day.  vary the time of day when you check, between before the 3 meals, and at bedtime.  also check if you have symptoms of your blood sugar being too high or too low.  please keep a record of the readings and bring it to your next appointment here (or you can bring the meter itself).  You can write it on any piece of paper.  please call us sooner if your blood sugar goes below 70, or if you have a lot of readings over 200.   Please come back for a follow-up appointment in 3 months.

## 2017-01-08 NOTE — Progress Notes (Signed)
Subjective:    Patient ID: Chad Mcmillan, male    DOB: 03/06/1924, 83 y.o.   MRN: [id number]  HPI Pt returns for f/u of diabetes mellitus:  DM type: Insulin-requiring type 2 Dx'ed: 1989.   Complications: renal insuff, CAD, retinopathy, polyneuropathy, and CVA.   Therapy: insulin since 2004.  DKA: never.  Severe hypoglycemia: never.  Pancreatitis: never.  Other: he takes multiple daily injections; he prefers human insulin due to cost.   Interval history: no cbg record, but states cbg's are well-controlled.  There is no trend throughout the day, except is is lowest fasting.  He takes humalog as rx'ed, but he takes NPH 20 units at bedtime.   Past Medical History:  Diagnosis Date  . ADENOCARCINOMA, PROSTATE 06/01/2008  . CORONARY ARTERY DISEASE 06/13/2007  . CVA 06/01/2008   x 2  . DIABETES MELLITUS, TYPE I, WITH OPHTHALMIC COMPLICATIONS 3/79/0240  . 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 HYPERKALEMIA 06/01/2008   due to DM neparopathy  . HYPERLIPIDEMIA 06/13/2007  . HYPERTENSION 06/13/2007  . NEOPLASM, MALIGNANT, SKIN, FACE 06/01/2008  . Other chronic nonalcoholic liver disease 9/73/5329  . RENAL DISEASE 06/01/2008    Past Surgical History:  Procedure Laterality Date  . Carotid Duplex  09/12/2004  . CATARACT EXTRACTION    . CHOLECYSTECTOMY    . ESOPHAGOGASTRODUODENOSCOPY  06/28/1991  . HERNIA REPAIR     right Inguinal hernia  . Rest/Stress Cardiolite  01/22/2001    Social History   Social History  . Marital status: Married    Spouse name: N/A  . Number of children: N/A  . Years of education: N/A   Occupational History  . Retired Retired   Social History Main Topics  . Smoking status: Former Smoker    Quit date: 11/18/1978  . Smokeless tobacco: Never Used  . Alcohol use No  . Drug use: No  . Sexual activity: No   Other Topics Concern  . Not on file   Social History Narrative  . No narrative on file    Current Outpatient Prescriptions on File Prior to Visit  Medication Sig  Dispense Refill  . amLODipine (NORVASC) 2.5 MG tablet TAKE ONE TABLET BY MOUTH DAILY. 30 tablet 5  . aspirin 325 MG tablet Take 325 mg by mouth daily.      . B-D INS SYR ULTRAFINE .3CC/30G 30G X 1/2" 0.3 ML MISC USE AS DIRECTED 4 TIMES DAILY 300 each 1  . Blood Glucose Monitoring Suppl (ONE TOUCH ULTRA 2) W/DEVICE KIT Use to check blood sugar 2 times per day. Dx Code E11.22 1 each 2  . Fluticasone-Salmeterol (ADVAIR) 100-50 MCG/DOSE AEPB Inhale 1 puff into the lungs 2 (two) times daily. (Patient taking differently: Inhale 1 puff into the lungs as needed. ) 1 each 3  . furosemide (LASIX) 20 MG tablet TAKE ONE TABLET BY MOUTH DAILY 30 tablet 5  . glucose blood (ONE TOUCH ULTRA TEST) test strip USE  STRIP TO CHECK GLUCOSE TWICE DAILY AS DIRECTED Dx code: E11.9 100 each 4  . hydrALAZINE (APRESOLINE) 10 MG tablet TAKE ONE TABLET BY MOUTH THREE TIMES DAILY 270 tablet 0  . hydrochlorothiazide (MICROZIDE) 12.5 MG capsule Take 1 capsule (12.5 mg total) by mouth daily. 30 capsule 5  . ibuprofen (ADVIL,MOTRIN) 200 MG tablet Take 100-200 mg by mouth every 6 (six) hours as needed. For pain    . insulin lispro (HUMALOG) 100 UNIT/ML injection 3 times a day (just before each meal) 18-8-10 units. 20 mL 1  .  metoprolol tartrate (LOPRESSOR) 25 MG tablet TAKE ONE-HALF TABLET BY MOUTH TWICE DAILY 60 tablet 0  . nitroGLYCERIN (NITROSTAT) 0.4 MG SL tablet Place 1 tablet (0.4 mg total) under the tongue every 5 (five) minutes as needed. For chest pain 25 tablet 2  . ONETOUCH DELICA LANCETS 99M MISC USE  TO CHECK GLUCOSE TWICE DAILY AS DIRECTED E11.9 100 each 13  . pravastatin (PRAVACHOL) 80 MG tablet TAKE ONE TABLET BY MOUTH DAILY 30 tablet 5  . promethazine-codeine (PHENERGAN WITH CODEINE) 6.25-10 MG/5ML syrup Take 5 mLs by mouth every 4 (four) hours as needed. 240 mL 0   No current facility-administered medications on file prior to visit.     No Known Allergies  No family history on file.  BP 110/70   Pulse 67    Ht 6' (1.829 m)   Wt 193 lb (87.5 kg)   SpO2 97%   BMI 26.18 kg/m    Review of Systems He denies hypoglycemia.      Objective:   Physical Exam VITAL SIGNS:  See vs page.  GENERAL: no distress.  Pulses: dorsalis pedis intact bilat.   MSK: no deformity of the feet.   CV: no leg edema, but there are bilat vv's.   Skin:  no ulcer on the feet, but there are abrasions on the legs.  normal color and temp on the feet. Neuro: sensation is intact to touch on the feet Ext: There is bilateral onychomycosis of the toenails.    A1c=6.9%    Assessment & Plan:  Insulin-requiring type 2 DM: overcontrolled Frail elderly state: in this setting, he is not a candidate for aggressive glycemic control.  Patient is advised the following: Patient Instructions  Please reduce the bedtime NPH insulin to 10 units. Please continue the same other insulin shots.  check your blood sugar twice a day.  vary the time of day when you check, between before the 3 meals, and at bedtime.  also check if you have symptoms of your blood sugar being too high or too low.  please keep a record of the readings and bring it to your next appointment here (or you can bring the meter itself).  You can write it on any piece of paper.  please call us sooner if your blood sugar goes below 70, or if you have a lot of readings over 200.   Please come back for a follow-up appointment in 3 months.

## 2017-01-13 ENCOUNTER — Encounter: Payer: Self-pay | Admitting: Endocrinology

## 2017-01-13 ENCOUNTER — Other Ambulatory Visit: Payer: Self-pay

## 2017-01-13 ENCOUNTER — Ambulatory Visit (INDEPENDENT_AMBULATORY_CARE_PROVIDER_SITE_OTHER): Payer: Medicare Other | Admitting: Ophthalmology

## 2017-01-13 DIAGNOSIS — E11319 Type 2 diabetes mellitus with unspecified diabetic retinopathy without macular edema: Secondary | ICD-10-CM

## 2017-01-13 DIAGNOSIS — H353132 Nonexudative age-related macular degeneration, bilateral, intermediate dry stage: Secondary | ICD-10-CM | POA: Diagnosis not present

## 2017-01-13 DIAGNOSIS — H35033 Hypertensive retinopathy, bilateral: Secondary | ICD-10-CM

## 2017-01-13 DIAGNOSIS — E113293 Type 2 diabetes mellitus with mild nonproliferative diabetic retinopathy without macular edema, bilateral: Secondary | ICD-10-CM

## 2017-01-13 DIAGNOSIS — I10 Essential (primary) hypertension: Secondary | ICD-10-CM | POA: Diagnosis not present

## 2017-01-13 DIAGNOSIS — H43813 Vitreous degeneration, bilateral: Secondary | ICD-10-CM | POA: Diagnosis not present

## 2017-01-13 LAB — HM DIABETES EYE EXAM

## 2017-01-13 MED ORDER — AMLODIPINE BESYLATE 2.5 MG PO TABS: 2.5000 mg | ORAL_TABLET | Freq: Every day | ORAL | 5 refills | Status: DC

## 2017-01-13 MED ORDER — METOPROLOL TARTRATE 25 MG PO TABS: 12.5000 mg | ORAL_TABLET | Freq: Two times a day (BID) | ORAL | 0 refills | Status: DC

## 2017-01-13 MED ORDER — FUROSEMIDE 20 MG PO TABS: 20.0000 mg | ORAL_TABLET | Freq: Every day | ORAL | 5 refills | Status: DC

## 2017-03-10 ENCOUNTER — Other Ambulatory Visit: Payer: Self-pay | Admitting: Endocrinology

## 2017-04-07 ENCOUNTER — Encounter: Payer: Self-pay | Admitting: Endocrinology

## 2017-04-07 ENCOUNTER — Ambulatory Visit (INDEPENDENT_AMBULATORY_CARE_PROVIDER_SITE_OTHER): Payer: Medicare Other | Admitting: Endocrinology

## 2017-04-07 VITALS — BP 132/86 | HR 61 | Ht 72.0 in | Wt 193.0 lb

## 2017-04-07 DIAGNOSIS — Z794 Long term (current) use of insulin: Secondary | ICD-10-CM | POA: Diagnosis not present

## 2017-04-07 DIAGNOSIS — E1122 Type 2 diabetes mellitus with diabetic chronic kidney disease: Secondary | ICD-10-CM | POA: Diagnosis not present

## 2017-04-07 DIAGNOSIS — R413 Other amnesia: Secondary | ICD-10-CM | POA: Diagnosis not present

## 2017-04-07 DIAGNOSIS — N183 Chronic kidney disease, stage 3 (moderate): Secondary | ICD-10-CM

## 2017-04-07 DIAGNOSIS — D61818 Other pancytopenia: Secondary | ICD-10-CM | POA: Diagnosis not present

## 2017-04-07 DIAGNOSIS — N189 Chronic kidney disease, unspecified: Secondary | ICD-10-CM

## 2017-04-07 DIAGNOSIS — K7581 Nonalcoholic steatohepatitis (NASH): Secondary | ICD-10-CM

## 2017-04-07 DIAGNOSIS — C61 Malignant neoplasm of prostate: Secondary | ICD-10-CM

## 2017-04-07 DIAGNOSIS — D649 Anemia, unspecified: Secondary | ICD-10-CM

## 2017-04-07 LAB — CBC WITH DIFFERENTIAL/PLATELET
BASOS ABS: 0 10*3/uL (ref 0.0–0.1)
Basophils Relative: 0.5 % (ref 0.0–3.0)
Eosinophils Absolute: 0.1 10*3/uL (ref 0.0–0.7)
Eosinophils Relative: 2.6 % (ref 0.0–5.0)
HEMATOCRIT: 36.7 % — AB (ref 39.0–52.0)
HEMOGLOBIN: 12.3 g/dL — AB (ref 13.0–17.0)
Lymphocytes Relative: 28.2 % (ref 12.0–46.0)
Lymphs Abs: 1.4 10*3/uL (ref 0.7–4.0)
MCHC: 33.5 g/dL (ref 30.0–36.0)
MCV: 94.2 fl (ref 78.0–100.0)
Monocytes Absolute: 0.4 10*3/uL (ref 0.1–1.0)
Monocytes Relative: 8.8 % (ref 3.0–12.0)
NEUTROS ABS: 3.1 10*3/uL (ref 1.4–7.7)
Neutrophils Relative %: 59.9 % (ref 43.0–77.0)
PLATELETS: 111 10*3/uL — AB (ref 150.0–400.0)
RBC: 3.9 Mil/uL — ABNORMAL LOW (ref 4.22–5.81)
RDW: 13.6 % (ref 11.5–15.5)
WBC: 5.1 10*3/uL (ref 4.0–10.5)

## 2017-04-07 LAB — LIPID PANEL
CHOL/HDL RATIO: 4
Cholesterol: 142 mg/dL (ref 0–200)
HDL: 38.3 mg/dL — AB (ref >39.00)
LDL CALC: 86 mg/dL (ref 0–99)
NonHDL: 103.63
TRIGLYCERIDES: 87 mg/dL (ref 0.0–149.0)
VLDL: 17.4 mg/dL (ref 0.0–40.0)

## 2017-04-07 LAB — POCT GLYCOSYLATED HEMOGLOBIN (HGB A1C): Hemoglobin A1C: 6.8

## 2017-04-07 LAB — BASIC METABOLIC PANEL
BUN: 35 mg/dL — AB (ref 6–23)
CALCIUM: 8.8 mg/dL (ref 8.4–10.5)
CO2: 28 mEq/L (ref 19–32)
CREATININE: 1.72 mg/dL — AB (ref 0.40–1.50)
Chloride: 106 mEq/L (ref 96–112)
GFR: 39.82 mL/min — AB (ref >60.00)
Glucose, Bld: 147 mg/dL — ABNORMAL HIGH (ref 70–99)
Potassium: 5.4 mEq/L — ABNORMAL HIGH (ref 3.5–5.1)
Sodium: 137 mEq/L (ref 135–145)

## 2017-04-07 LAB — HEPATIC FUNCTION PANEL
ALK PHOS: 90 U/L (ref 39–117)
ALT: 22 U/L (ref 0–53)
AST: 27 U/L (ref 0–37)
Albumin: 3.8 g/dL (ref 3.5–5.2)
BILIRUBIN DIRECT: 0.1 mg/dL (ref 0.0–0.3)
TOTAL PROTEIN: 6.5 g/dL (ref 6.0–8.3)
Total Bilirubin: 0.5 mg/dL (ref 0.2–1.2)

## 2017-04-07 LAB — MICROALBUMIN / CREATININE URINE RATIO
CREATININE, U: 85.1 mg/dL
Microalb Creat Ratio: 92.9 mg/g — ABNORMAL HIGH (ref 0.0–30.0)
Microalb, Ur: 79.1 mg/dL — ABNORMAL HIGH (ref 0.0–1.9)

## 2017-04-07 LAB — TSH: TSH: 3.42 u[IU]/mL (ref 0.35–4.50)

## 2017-04-07 LAB — PSA: PSA: 1.47 ng/mL (ref 0.10–4.00)

## 2017-04-07 LAB — VITAMIN B12: Vitamin B-12: 272 pg/mL (ref 211–911)

## 2017-04-07 LAB — IBC PANEL
Iron: 87 ug/dL (ref 42–165)
Saturation Ratios: 27.9 % (ref 20.0–50.0)
Transferrin: 223 mg/dL (ref 212.0–360.0)

## 2017-04-07 MED ORDER — INSULIN NPH (HUMAN) (ISOPHANE) 100 UNIT/ML ~~LOC~~ SUSP: 8.0000 [IU] | Freq: Every day | SUBCUTANEOUS | 2 refills | Status: DC

## 2017-04-07 NOTE — Progress Notes (Addendum)
Subjective:    Patient ID: Chad Mcmillan, male    DOB: 03-22-1924, 83 y.o.   MRN: [id number]  HPI Pt returns for f/u of diabetes mellitus:  DM type: Insulin-requiring type 2 Dx'ed: 1989.   Complications: renal insuff, CAD, retinopathy, polyneuropathy, and CVA.   Therapy: insulin since 2004.  DKA: never.  Severe hypoglycemia: never.  Pancreatitis: never.  Other: he takes multiple daily injections; he prefers human insulin due to cost.   Interval history: no cbg record, but states cbg's vary from 71-200.  There is no trend throughout the day, except is is lowest in the middle of the night.     Review of Systems He denies hypoglycemia    Objective:   Physical Exam VITAL SIGNS:  See vs page.  GENERAL: no distress.  Pulses: dorsalis pedis intact bilat.   MSK: no deformity of the feet.   CV: no leg edema, but there are bilat vv's.   Skin:  no ulcer on the feet.  normal color and temp on the feet. Neuro: sensation is intact to touch on the feet Ext: There is bilateral onychomycosis of the toenails.    A1c=6.8% Lab Results  Component Value Date   WBC 5.1 04/07/2017   HGB 12.3 (L) 04/07/2017   HCT 36.7 (L) 04/07/2017   MCV 94.2 04/07/2017   PLT 111.0 (L) 04/07/2017   Lab Results  Component Value Date   CREATININE 1.72 (H) 04/07/2017   BUN 35 (H) 04/07/2017   NA 137 04/07/2017   K 5.4 (H) 04/07/2017   CL 106 04/07/2017   CO2 28 04/07/2017      Assessment & Plan:  Insulin-requiring type 2 DM: overcontrolled.  Please reduce the bedtime NPH insulin to 8 units.  Please continue the same other insulin shots Anemia, mild.  We'll follow. Renal failure, slightly worse.  We'll follow.   Subjective:   Patient here for Medicare annual wellness visit and management of other chronic and acute problems.     Risk factors: advanced age    106 of Physicians Providing Medical Care to Patient:  See "snapshot"   Activities of Daily Living: In your present state of health, do  you have any difficulty performing the following activities (lives with wife)?:  Preparing food and eating?: No  Bathing yourself: No  Getting dressed: No  Using the toilet:No  Moving around from place to place: No  In the past year have you fallen or had a near fall?:No    Home Safety: Has smoke detector and wears seat belts. Firearms are safely stored. No excess sun exposure.  Diet and Exercise  Current exercise habits: limited by health problems Dietary issues discussed: pt reports a healthy diet    Depression Screen  Q1: Over the past two weeks, have you felt down, depressed or hopeless?no  Q2: Over the past two weeks, have you felt little interest or pleasure in doing things? no   The following portions of the patient's history were reviewed and updated as appropriate: allergies, current medications, past family history, past medical history, past social history, past surgical history and problem list.   Review of Systems  No change in chronic hearing and visual loss Objective:   Vision:  Advertising account executive, so he declines VA today Hearing: grossly decreased Body mass index:  See vs page Msk: pt easily and quickly performs "get-up-and-go" from a sitting position Cognitive Impairment Assessment: cognition, memory and judgment appear normal.  remembers 2/3 at 5 minutes.  excellent  recall.  can easily read and write a sentence.  alert and oriented x 3, except he says it is 04/19/17.   Assessment:   Medicare wellness utd on preventive parameters    Plan:   During the course of the visit the patient was educated and counseled about appropriate screening and preventive services including:       Fall prevention   Diabetes screening  Nutrition counseling   Vaccines are updated as needed today  Patient Instructions (the written plan) was given to the patient.

## 2017-04-07 NOTE — Progress Notes (Signed)
we discussed code status.  pt requests DNR 

## 2017-04-07 NOTE — Patient Instructions (Addendum)
Please reduce the bedtime NPH insulin to 8 units. Please continue the same other insulin shots.  check your blood sugar twice a day.  vary the time of day when you check, between before the 3 meals, and at bedtime.  also check if you have symptoms of your blood sugar being too high or too low.  please keep a record of the readings and bring it to your next appointment here (or you can bring the meter itself).  You can write it on any piece of paper.  please call us sooner if your blood sugar goes below 70, or if you have a lot of readings over 200.   Please consider these measures for your health:  minimize alcohol.  Do not use tobacco products.  Have a colonoscopy at least every 10 years from age 36.  Keep firearms safely stored.  Always use seat belts.  have working smoke alarms in your home.  See an eye doctor and dentist regularly.  Never drive under the influence of alcohol or drugs (including prescription drugs).  Those with fair skin should take precautions against the sun, and should carefully examine their skin once per month, for any new or changed moles. It is critically important to prevent falling down (keep floor areas well-lit, dry, and free of loose objects.  If you have a cane, walker, or wheelchair, you should use it, even for short trips around the house.  Wear flat-soled shoes.  Also, try not to rush) Please come back for a follow-up appointment in 3 months.

## 2017-04-15 ENCOUNTER — Other Ambulatory Visit: Payer: Self-pay | Admitting: Endocrinology

## 2017-04-16 ENCOUNTER — Other Ambulatory Visit: Payer: Self-pay | Admitting: Endocrinology

## 2017-05-11 ENCOUNTER — Emergency Department (HOSPITAL_COMMUNITY)
Admission: EM | Admit: 2017-05-11 | Discharge: 2017-05-11 | Disposition: A | Discharge status: Home / Self Care | Payer: Medicare Other | Attending: Emergency Medicine | Admitting: Emergency Medicine

## 2017-05-11 ENCOUNTER — Emergency Department (HOSPITAL_COMMUNITY): Payer: Medicare Other

## 2017-05-11 ENCOUNTER — Encounter (HOSPITAL_COMMUNITY): Payer: Self-pay

## 2017-05-11 DIAGNOSIS — Y9389 Activity, other specified: Secondary | ICD-10-CM | POA: Insufficient documentation

## 2017-05-11 DIAGNOSIS — Z794 Long term (current) use of insulin: Secondary | ICD-10-CM | POA: Diagnosis not present

## 2017-05-11 DIAGNOSIS — S0990XA Unspecified injury of head, initial encounter: Secondary | ICD-10-CM

## 2017-05-11 DIAGNOSIS — I119 Hypertensive heart disease without heart failure: Secondary | ICD-10-CM | POA: Insufficient documentation

## 2017-05-11 DIAGNOSIS — Y999 Unspecified external cause status: Secondary | ICD-10-CM | POA: Diagnosis not present

## 2017-05-11 DIAGNOSIS — W19XXXA Unspecified fall, initial encounter: Secondary | ICD-10-CM | POA: Diagnosis not present

## 2017-05-11 DIAGNOSIS — Z87891 Personal history of nicotine dependence: Secondary | ICD-10-CM | POA: Diagnosis not present

## 2017-05-11 DIAGNOSIS — Z7982 Long term (current) use of aspirin: Secondary | ICD-10-CM | POA: Insufficient documentation

## 2017-05-11 DIAGNOSIS — Y929 Unspecified place or not applicable: Secondary | ICD-10-CM | POA: Insufficient documentation

## 2017-05-11 DIAGNOSIS — E119 Type 2 diabetes mellitus without complications: Secondary | ICD-10-CM | POA: Insufficient documentation

## 2017-05-11 DIAGNOSIS — Z8673 Personal history of transient ischemic attack (TIA), and cerebral infarction without residual deficits: Secondary | ICD-10-CM | POA: Insufficient documentation

## 2017-05-11 DIAGNOSIS — I2510 Atherosclerotic heart disease of native coronary artery without angina pectoris: Secondary | ICD-10-CM | POA: Insufficient documentation

## 2017-05-11 DIAGNOSIS — S0101XA Laceration without foreign body of scalp, initial encounter: Secondary | ICD-10-CM | POA: Insufficient documentation

## 2017-05-11 MED ORDER — LIDOCAINE-EPINEPHRINE (PF) 2 %-1:200000 IJ SOLN
10.0000 mL | Freq: Once | INTRAMUSCULAR | Status: AC
  Administered 2017-05-11: 10 mL
  Filled 2017-05-11: qty 20

## 2017-05-11 NOTE — ED Notes (Signed)
Patient Alert and oriented X4. Stable and ambulatory. Patient verbalized understanding of the discharge instructions.  Patient belongings were taken by the patient.  

## 2017-05-11 NOTE — ED Triage Notes (Signed)
Per EMS: Pt changing lightbulb, fell backwards. Pt struck head on ground. Pt with lac to back of head, bleeding controlled upon arrival. Pt unsure if LOC. Pt a/o x 4 upon arrival.

## 2017-05-12 ENCOUNTER — Telehealth: Payer: Self-pay | Admitting: Endocrinology

## 2017-05-12 NOTE — Telephone Encounter (Signed)
No we do have this tool here.

## 2017-05-12 NOTE — Telephone Encounter (Signed)
See message and please advise, Thanks!  

## 2017-05-12 NOTE — Telephone Encounter (Signed)
Options: 1.  If we can get a staple remover here in a few days, I would be happy to remove here 2.  If not, pt would need to go to urgent care or another site

## 2017-05-12 NOTE — ED Provider Notes (Signed)
[facility] DEPT Provider Note   CSN: [id number] Arrival date & time: 05/11/17  1647     History   Chief Complaint Chief Complaint  Patient presents with  . Fall  . Head Injury    HPI Chad Mcmillan is a 83 y.o. male.  83 year old male with past medical history below who presents with head injury after a fall. Just prior to arrival, the patient was trying to change a light bulb and lost his balance, falling backwards and striking the back of his head. He may have lost consciousness for a few seconds but quickly woke up. He sustained a laceration to to his posterior scalp. He denies any other areas of pain including no neck or back pain, chest or abdominal pain, or extremity injury. No visual changes, vomiting, confusion, balance problems, or numbness. He is up-to-date on tetanus.   The history is provided by the patient.    Past Medical History:  Diagnosis Date  . ADENOCARCINOMA, PROSTATE 06/01/2008  . CORONARY ARTERY DISEASE 06/13/2007  . CVA 06/01/2008   x 2  . DIABETES MELLITUS, TYPE I, WITH OPHTHALMIC COMPLICATIONS 02/24/8118  . 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 HYPERKALEMIA 06/01/2008   due to DM neparopathy  . HYPERLIPIDEMIA 06/13/2007  . HYPERTENSION 06/13/2007  . NEOPLASM, MALIGNANT, SKIN, FACE 06/01/2008  . Other chronic nonalcoholic liver disease 1/47/8295  . RENAL DISEASE 06/01/2008    Patient Active Problem List   Diagnosis Date Noted  . Memory loss 04/07/2017  . Numbness 03/06/2016  . Dizzy 01/31/2016  . Medication side effect 03/08/2015  . Pancytopenia ([place]) 07/27/2012  . Screening for prostate cancer 07/27/2012  . DM (diabetes mellitus) ([place]) 07/27/2012  . Anemia 07/27/2012  . Encounter for long-term (current) use of other medications 07/23/2011  . NASH (nonalcoholic steatohepatitis) 07/17/2010  . COUGH 02/06/2010  . PNEUMONIA, ORGANISM UNSPECIFIED 11/27/2009  . NEOPLASM, MALIGNANT, SKIN, FACE 06/01/2008  . ADENOCARCINOMA, PROSTATE 06/01/2008  . HYPERKALEMIA  06/01/2008  . CVA 06/01/2008  . Disorder of kidney and ureter 06/01/2008  . NEOPLASM, MALIGNANT, SKIN, FACE 06/01/2008  . Dyslipidemia 06/13/2007  . Essential hypertension 06/13/2007  . CORONARY ARTERY DISEASE 06/13/2007    Past Surgical History:  Procedure Laterality Date  . Carotid Duplex  09/12/2004  . CATARACT EXTRACTION    . CHOLECYSTECTOMY    . ESOPHAGOGASTRODUODENOSCOPY  06/28/1991  . HERNIA REPAIR     right Inguinal hernia  . Rest/Stress Cardiolite  01/22/2001       Home Medications    Prior to Admission medications   Medication Sig Start Date End Date Taking? Authorizing Provider  acetaminophen (TYLENOL) 500 MG tablet Take 500-1,000 mg by mouth every 6 (six) hours as needed for headache (pain).   Yes [provider]  amLODipine (NORVASC) 2.5 MG tablet Take 1 tablet (2.5 mg total) by mouth daily. 01/13/17  Yes [name], MD  aspirin 500 MG EC tablet Take 500 mg by mouth daily.   Yes [provider]  furosemide (LASIX) 20 MG tablet Take 1 tablet (20 mg total) by mouth daily. 01/13/17  Yes [name], MD  HUMALOG 100 UNIT/ML injection INJECT 18 UNITS, 8 UNITS, AND 10 UNITS INTO THE SKIN 3 TIMES DAILY BEFORE EACH MEAL Patient taking differently: INJECT 15 UNITS SUBCUTANEOUSLY BEFORE BREAKFAST AND 10 UNITS BEFORE LUNCH AND SUPPER 04/16/17  Yes [name], MD  hydrALAZINE (APRESOLINE) 10 MG tablet TAKE ONE TABLET BY MOUTH THREE TIMES DAILY Patient taking differently: TAKE ONE TABLET BY MOUTH DAILY 03/10/17  Yes [name], MD  insulin NPH Human (HUMULIN N) 100 UNIT/ML injection Inject 0.08 mLs (8 Units total) into the skin at bedtime. Patient taking differently: Inject 10 Units into the skin at bedtime.  04/07/17  Yes [name], MD  metoprolol tartrate (LOPRESSOR) 25 MG tablet TAKE 1/2 (ONE-HALF) TABLET BY MOUTH TWICE DAILY 04/15/17  Yes [name], MD  pravastatin (PRAVACHOL) 80 MG tablet TAKE ONE TABLET BY MOUTH ONCE DAILY 03/10/17  Yes  [name], MD  B-D INS SYR ULTRAFINE .3CC/30G 30G X 1/2" 0.3 ML MISC USE AS DIRECTED 4 TIMES DAILY 04/24/15   [name], MD  Blood Glucose Monitoring Suppl (ONE TOUCH ULTRA 2) W/DEVICE KIT Use to check blood sugar 2 times per day. Dx Code E11.22 06/09/15   [name], MD  Fluticasone-Salmeterol (ADVAIR) 100-50 MCG/DOSE AEPB Inhale 1 puff into the lungs 2 (two) times daily. Patient not taking: Reported on 05/11/2017 04/23/16   [name], MD  glucose blood (ONE TOUCH ULTRA TEST) test strip USE  STRIP TO CHECK GLUCOSE TWICE DAILY AS DIRECTED Dx code: E11.9 07/19/16   [name], MD  HUMALOG 100 UNIT/ML injection INJECT 18 UNITS, 8 UNITS, AND 10 UNITS INTO THE SKIN 3 TIMES DAILY BEFORE [facility] MEAL Patient not taking: Reported on 05/11/2017 03/10/17   [name], MD  hydrochlorothiazide (MICROZIDE) 12.5 MG capsule Take 1 capsule (12.5 mg total) by mouth daily. Patient not taking: Reported on 05/11/2017 01/16/16   [name], MD  nitroGLYCERIN (NITROSTAT) 0.4 MG SL tablet Place 1 tablet (0.4 mg total) under the tongue every 5 (five) minutes as needed. For chest pain Patient taking differently: Place 0.4 mg under the tongue every 5 (five) minutes as needed for chest pain.  07/11/14   [name], MD  [facility] DELICA LANCETS 71G MISC USE  TO CHECK GLUCOSE TWICE DAILY AS DIRECTED E11.9 07/17/16   [name], MD  promethazine-codeine [facility] WITH CODEINE) 6.25-10 MG/5ML syrup Take 5 mLs by mouth every 4 (four) hours as needed. Patient not taking: Reported on 04/07/2017 04/23/16   [name], MD    Family History History reviewed. No pertinent family history.  Social History Social History  Substance Use Topics  . Smoking status: Former Smoker    Quit date: 11/18/1978  . Smokeless tobacco: Never Used  . Alcohol use No     Allergies   Patient has no known allergies.   Review of Systems Review of Systems All other systems reviewed and are negative except that which was mentioned  in HPI   Physical Exam Updated Vital Signs BP (!) 212/66   Pulse 68   Resp 10   SpO2 95%   Physical Exam  Constitutional: He is oriented to person, place, and time. He appears well-developed and well-nourished. No distress.  Awake, alert  HENT:  Head: Normocephalic.  Hematoma posterior scalp w/ overlying 3cm laceration, not actively bleeding  Eyes: Conjunctivae and EOM are normal. Pupils are equal, round, and reactive to light.  Neck: Neck supple.  No midline spinal tenderness  Cardiovascular: Normal rate, regular rhythm and normal heart sounds.   Pulmonary/Chest: Effort normal and breath sounds normal. No respiratory distress. He exhibits no tenderness.  Abdominal: Soft. Bowel sounds are normal. He exhibits no distension. There is no tenderness.  Musculoskeletal: He exhibits no edema, tenderness or deformity.  Neurological: He is alert and oriented to person, place, and time. He has normal reflexes. No cranial nerve deficit. He exhibits normal muscle tone.  Fluent speech 5/5  strength and normal sensation x all 4 extremities  Skin: Skin is warm and dry.  Psychiatric: He has a normal mood and affect. Judgment and thought content normal.  Nursing note and vitals reviewed.    ED Treatments / Results  Labs (all labs ordered are listed, but only abnormal results are displayed) Labs Reviewed - No data to display  EKG  EKG Interpretation None       Radiology Ct Head Wo Contrast  Result Date: 05/11/2017 CLINICAL DATA:  Fall.  Pain. EXAM: CT HEAD WITHOUT CONTRAST CT CERVICAL SPINE WITHOUT CONTRAST TECHNIQUE: Multidetector CT imaging of the head and cervical spine was performed following the standard protocol without intravenous contrast. Multiplanar CT image reconstructions of the cervical spine were also generated. COMPARISON:  June 27, 2011 FINDINGS: CT HEAD FINDINGS Brain: No subdural, epidural, or subarachnoid hemorrhage identified. No mass, mass effect, or midline shift.  Ventricles and sulci are prominent but stable. Probable chronic right cerebellar infarct. Cerebellum, brainstem, and basal cisterns are otherwise normal. White matter changes again identified. No acute cortical ischemia or infarct. Vascular: Calcified atherosclerosis is seen in the intracranial carotid arteries. Skull: No fractures underlying the left posterior scalp hematoma. The calvarium is otherwise intact Sinuses/Orbits: There is a small amount of fluid and debris in the right maxillary sinus. Mastoid air cells and middle ears are well-aerated. Other: There is hematoma and swelling over the posterior left scalp on series 4, image 19. Air in the soft tissues just overlying laceration. Extracranial soft tissues are otherwise within normal limits. CT CERVICAL SPINE FINDINGS Alignment: Normal. Skull base and vertebrae: No acute fracture. No primary bone lesion or focal pathologic process. Soft tissues and spinal canal: No prevertebral fluid or swelling. No visible canal hematoma. Disc levels:  Multilevel degenerative changes. Upper chest: Negative. Other: No other abnormalities are identified. IMPRESSION: 1. Hematoma over the left posterior scalp. No acute intracranial abnormality identified. 2. Degenerative changes in the cervical spine with no fracture or traumatic malalignment. Electronically Signed   By: Dorise Bullion III M.D   On: 05/11/2017 19:14   Ct Cervical Spine Wo Contrast  Result Date: 05/11/2017 CLINICAL DATA:  Fall.  Pain. EXAM: CT HEAD WITHOUT CONTRAST CT CERVICAL SPINE WITHOUT CONTRAST TECHNIQUE: Multidetector CT imaging of the head and cervical spine was performed following the standard protocol without intravenous contrast. Multiplanar CT image reconstructions of the cervical spine were also generated. COMPARISON:  June 27, 2011 FINDINGS: CT HEAD FINDINGS Brain: No subdural, epidural, or subarachnoid hemorrhage identified. No mass, mass effect, or midline shift. Ventricles and sulci are  prominent but stable. Probable chronic right cerebellar infarct. Cerebellum, brainstem, and basal cisterns are otherwise normal. White matter changes again identified. No acute cortical ischemia or infarct. Vascular: Calcified atherosclerosis is seen in the intracranial carotid arteries. Skull: No fractures underlying the left posterior scalp hematoma. The calvarium is otherwise intact Sinuses/Orbits: There is a small amount of fluid and debris in the right maxillary sinus. Mastoid air cells and middle ears are well-aerated. Other: There is hematoma and swelling over the posterior left scalp on series 4, image 19. Air in the soft tissues just overlying laceration. Extracranial soft tissues are otherwise within normal limits. CT CERVICAL SPINE FINDINGS Alignment: Normal. Skull base and vertebrae: No acute fracture. No primary bone lesion or focal pathologic process. Soft tissues and spinal canal: No prevertebral fluid or swelling. No visible canal hematoma. Disc levels:  Multilevel degenerative changes. Upper chest: Negative. Other: No other abnormalities are identified. IMPRESSION: 1. Hematoma  over the left posterior scalp. No acute intracranial abnormality identified. 2. Degenerative changes in the cervical spine with no fracture or traumatic malalignment. Electronically Signed   By: Dorise Bullion III M.D   On: 05/11/2017 19:14    Procedures .Marland KitchenLaceration Repair Date/Time: 05/12/2017 1:11 AM Performed by: Payton Emerald Authorized by: Sharlett Iles   Consent:    Consent obtained:  Verbal   Consent given by:  Patient Anesthesia (see MAR for exact dosages):    Anesthesia method:  Local infiltration   Local anesthetic:  Lidocaine 1% WITH epi Laceration details:    Location:  Scalp   Scalp location:  Occipital   Length (cm):  3 Repair type:    Repair type:  Simple Exploration:    Hemostasis achieved with:  Epinephrine   Wound exploration: entire depth of wound probed and visualized     Treatment:    Area cleansed with:  Saline   Amount of cleaning:  Standard Skin repair:    Repair method:  Staples   Number of staples:  7 Approximation:    Approximation:  Close Post-procedure details:    Dressing:  Non-adherent dressing   Patient tolerance of procedure:  Tolerated well, no immediate complications Comments:     Blood clot removed with small arteriole bleeding afterwards, hemostasis achieved with lidocaine w/ epi and wound closure   (including critical care time)  Medications Ordered in ED Medications  lidocaine-EPINEPHrine (XYLOCAINE W/EPI) 2 %-1:200000 (PF) injection 10 mL (10 mLs Other Given by Other 05/11/17 2111)     Initial Impression / Assessment and Plan / ED Course  I have reviewed the triage vital signs and the nursing notes.  Pertinent imaging results that were available during my care of the patient were reviewed by me and considered in my medical decision making (see chart for details).    PT w/ scalp lac after fall, Wasn't and neurologically intact on arrival. He denied any other complaints and had no other areas of trauma. Obtained CT of head and C-spine which were negative for acute injury aside from his scalp hematoma. Dr. Rene Kocher and I repaired lac w/ staples, discussed wound care instructions and follow-up for staple removal. Extensively reviewed return precautions with the patient and his son including vomiting, visual changes, balance problems, or any other new neurologic symptoms, as well as for any signs of infection of wound. They voiced understanding and patient was discharged in satisfactory condition.  Final Clinical Impressions(s) / ED Diagnoses   Final diagnoses:  Injury of head, initial encounter  Laceration of scalp, initial encounter    New Prescriptions Discharge Medication List as of 05/11/2017  9:59 PM       Little, Wenda Overland, MD 05/12/17 [phone]

## 2017-05-12 NOTE — Telephone Encounter (Signed)
Do we have a disposable staple remover?

## 2017-05-12 NOTE — Telephone Encounter (Signed)
Patient's son Louie Casa called to advise that the patient fell over the weekend and had to get 7 staples put in his head. The staples need to come out in 7 days and the ER advised that Dr. Loanne Drilling could possibly remove them. Call Louie Casa to advise if this is possible or not. Call the home phone at [phone] today. Okay to leave a detailed message.

## 2017-05-13 ENCOUNTER — Telehealth: Payer: Self-pay

## 2017-05-13 NOTE — Telephone Encounter (Signed)
Called to notify patient that we did not have that tool in the office to get staples out, patient son stated he would call urgent care or primary office to see if they could take them out.   No other questions at this time.

## 2017-05-19 ENCOUNTER — Encounter (HOSPITAL_COMMUNITY): Payer: Self-pay | Admitting: Emergency Medicine

## 2017-05-19 ENCOUNTER — Ambulatory Visit (HOSPITAL_COMMUNITY)
Admission: EM | Admit: 2017-05-19 | Discharge: 2017-05-19 | Disposition: A | Discharge status: Home / Self Care | Payer: Medicare Other

## 2017-05-19 DIAGNOSIS — Z4802 Encounter for removal of sutures: Secondary | ICD-10-CM

## 2017-05-19 DIAGNOSIS — S20412A Abrasion of left back wall of thorax, initial encounter: Secondary | ICD-10-CM

## 2017-05-19 DIAGNOSIS — S0101XD Laceration without foreign body of scalp, subsequent encounter: Secondary | ICD-10-CM | POA: Diagnosis not present

## 2017-05-19 NOTE — ED Triage Notes (Signed)
The patient presented to the [facility] to have staple removed from the back of the head that were placed on 05/11/2017 after a fall. The patient also reported a laceration on his back that he was unsure if that occurred at the same time as the fall but he noticed it 4 days ago.

## 2017-05-19 NOTE — Discharge Instructions (Signed)
There is no signs of infection on your back wound right now. Wash gently with water and soap. Keep area dry and clean. You can use ice to help with muscle ache/itchiness of the wound. Do not put ice directly on skin. Monitor area for signs of infection such as discharge, increased redness/warmth, fever.

## 2017-05-19 NOTE — ED Provider Notes (Signed)
CSN: [id number]     Arrival date & time 05/19/17  1618 History   None    Chief Complaint  Patient presents with  . Suture / Staple Removal  . Laceration   (Consider location/radiation/quality/duration/timing/severity/associated sxs/prior Treatment) 83 yo male comes in with son for staples removal after fall one week ago, as well as a laceration on patient's back that he noticed 4 days ago. Patient does not know if the laceration happened during the fall. He started noticing it due to some pain and itchiness. Has not done anything for it. Patient's wound repair with staples have been healing well. Denies fever, headache, confusion. Patient is a diabetic which is well controlled.       Past Medical History:  Diagnosis Date  . ADENOCARCINOMA, PROSTATE 06/01/2008  . CORONARY ARTERY DISEASE 06/13/2007  . CVA 06/01/2008   x 2  . DIABETES MELLITUS, TYPE I, WITH OPHTHALMIC COMPLICATIONS 9/51/8841  . 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 HYPERKALEMIA 06/01/2008   due to DM neparopathy  . HYPERLIPIDEMIA 06/13/2007  . HYPERTENSION 06/13/2007  . NEOPLASM, MALIGNANT, SKIN, FACE 06/01/2008  . Other chronic nonalcoholic liver disease 6/60/6301  . RENAL DISEASE 06/01/2008   Past Surgical History:  Procedure Laterality Date  . Carotid Duplex  09/12/2004  . CATARACT EXTRACTION    . CHOLECYSTECTOMY    . ESOPHAGOGASTRODUODENOSCOPY  06/28/1991  . HERNIA REPAIR     right Inguinal hernia  . Rest/Stress Cardiolite  01/22/2001   History reviewed. No pertinent family history. Social History  Substance Use Topics  . Smoking status: Former Smoker    Quit date: 11/18/1978  . Smokeless tobacco: Never Used  . Alcohol use No    Review of Systems  Constitutional: Negative for chills, diaphoresis and fever.  Skin: Positive for wound.  Neurological: Negative for dizziness, tremors, syncope, weakness, light-headedness, numbness and headaches.  Psychiatric/Behavioral: Negative for confusion.    Allergies  Patient has  no known allergies.  Home Medications   Prior to Admission medications   Medication Sig Start Date End Date Taking? Authorizing Provider  acetaminophen (TYLENOL) 500 MG tablet Take 500-1,000 mg by mouth every 6 (six) hours as needed for headache (pain).    [provider]  amLODipine (NORVASC) 2.5 MG tablet Take 1 tablet (2.5 mg total) by mouth daily. 01/13/17   [name], MD  aspirin 500 MG EC tablet Take 500 mg by mouth daily.    [provider]  B-D INS SYR ULTRAFINE .3CC/30G 30G X 1/2" 0.3 ML MISC USE AS DIRECTED 4 TIMES DAILY 04/24/15   [name], MD  Blood Glucose Monitoring Suppl (ONE TOUCH ULTRA 2) W/DEVICE KIT Use to check blood sugar 2 times per day. Dx Code E11.22 06/09/15   [name], MD  furosemide (LASIX) 20 MG tablet Take 1 tablet (20 mg total) by mouth daily. 01/13/17   [name], MD  glucose blood (ONE TOUCH ULTRA TEST) test strip USE  STRIP TO CHECK GLUCOSE TWICE DAILY AS DIRECTED Dx code: E11.9 07/19/16   [name], MD  HUMALOG 100 UNIT/ML injection INJECT 18 UNITS, 8 UNITS, AND 10 UNITS INTO THE SKIN 3 TIMES DAILY BEFORE EACH MEAL Patient taking differently: INJECT 15 UNITS SUBCUTANEOUSLY BEFORE BREAKFAST AND 10 UNITS BEFORE LUNCH AND SUPPER 04/16/17   [name], MD  hydrALAZINE (APRESOLINE) 10 MG tablet TAKE ONE TABLET BY MOUTH THREE TIMES DAILY Patient taking differently: TAKE ONE TABLET BY MOUTH DAILY 03/10/17   [name], MD  insulin NPH Human (HUMULIN  N) 100 UNIT/ML injection Inject 0.08 mLs (8 Units total) into the skin at bedtime. Patient taking differently: Inject 10 Units into the skin at bedtime.  04/07/17   [name], MD  metoprolol tartrate (LOPRESSOR) 25 MG tablet TAKE 1/2 (ONE-HALF) TABLET BY MOUTH TWICE DAILY 04/15/17   [name], MD  nitroGLYCERIN (NITROSTAT) 0.4 MG SL tablet Place 1 tablet (0.4 mg total) under the tongue every 5 (five) minutes as needed. For chest pain Patient taking differently: Place 0.4 mg under  the tongue every 5 (five) minutes as needed for chest pain.  07/11/14   [name], MD  [facility] DELICA LANCETS 34V MISC USE  TO CHECK GLUCOSE TWICE DAILY AS DIRECTED E11.9 07/17/16   [name], MD  pravastatin (PRAVACHOL) 80 MG tablet TAKE ONE TABLET BY MOUTH ONCE DAILY 03/10/17   [name], MD   Meds Ordered and Administered this Visit  Medications - No data to display  BP (!) 150/80 (BP Location: Right Arm)   Pulse 70   Temp 98.4 F (36.9 C) (Oral)   Resp 18   SpO2 100%  No data found.   Physical Exam  Constitutional: He is oriented to person, place, and time. He appears well-developed and well-nourished. No distress.  HENT:  Head: Normocephalic and atraumatic.  Eyes: Conjunctivae are normal. Pupils are equal, round, and reactive to light.  Musculoskeletal:       Thoracic back: Normal. He exhibits normal range of motion.  Neurological: He is alert and oriented to person, place, and time.  Skin: Skin is warm and dry.  Wound on posterior scalp healing well. No erythema, increased warmth, discharge noted.  Patient with abrasion on his left back. Wound is scabbed over. Surrounding erythema, no increased warmth or discharge.   Psychiatric: He has a normal mood and affect. His behavior is normal. Judgment normal.    Urgent Care Course     Procedures (including critical care time)  Labs Review Labs Reviewed - No data to display  Imaging Review No results found.       MDM   1. Abrasion of left side of back, initial encounter    1. Discussed with patient and family member, abrasion on back without signs of infection at this time. Patient to keep wound clean and dry. Can wash gently with soap and water. Can use ice compression for muscle soreness and itching of the wound. Patient is diabetic with slightly decreased kidney function, so will avoid NSAIDs for pain. Patient to take tylenol for pain. To monitor wound for increased redness, warmth, discharge.     Ok Edwards, PA-C 05/19/17 [phone]

## 2017-06-15 ENCOUNTER — Other Ambulatory Visit: Payer: Self-pay | Admitting: Endocrinology

## 2017-07-07 ENCOUNTER — Ambulatory Visit (INDEPENDENT_AMBULATORY_CARE_PROVIDER_SITE_OTHER): Payer: Medicare Other | Admitting: Endocrinology

## 2017-07-07 ENCOUNTER — Encounter: Payer: Self-pay | Admitting: Endocrinology

## 2017-07-07 VITALS — BP 132/84 | HR 67 | Wt 188.2 lb

## 2017-07-07 DIAGNOSIS — E1122 Type 2 diabetes mellitus with diabetic chronic kidney disease: Secondary | ICD-10-CM

## 2017-07-07 DIAGNOSIS — N183 Chronic kidney disease, stage 3 (moderate): Secondary | ICD-10-CM | POA: Diagnosis not present

## 2017-07-07 DIAGNOSIS — Z794 Long term (current) use of insulin: Secondary | ICD-10-CM | POA: Diagnosis not present

## 2017-07-07 LAB — POCT GLYCOSYLATED HEMOGLOBIN (HGB A1C): HEMOGLOBIN A1C: 6.3

## 2017-07-07 MED ORDER — INSULIN LISPRO 100 UNIT/ML ~~LOC~~ SOLN: SUBCUTANEOUS | 1 refills | Status: DC

## 2017-07-07 MED ORDER — INSULIN NPH (HUMAN) (ISOPHANE) 100 UNIT/ML ~~LOC~~ SUSP: 5.0000 [IU] | Freq: Every day | SUBCUTANEOUS | 2 refills | Status: DC

## 2017-07-07 NOTE — Progress Notes (Signed)
Subjective:    Patient ID: Chad Mcmillan, male    DOB: 1924-02-17, 83 y.o.   MRN: [id number]  HPI Pt returns for f/u of diabetes mellitus:  DM type: Insulin-requiring type 2 Dx'ed: 1989.   Complications: renal insuff, CAD, retinopathy, polyneuropathy, and CVA.   Therapy: insulin since 2004.  DKA: never.  Severe hypoglycemia: never.  Pancreatitis: never.  Other: he takes multiple daily injections; he prefers human insulin due to cost.   Interval history: no cbg record, but states cbg's vary from 91-200.  There is no trend throughout the day, except is is lowest in the middle of the night.   Past Medical History:  Diagnosis Date  . ADENOCARCINOMA, PROSTATE 06/01/2008  . CORONARY ARTERY DISEASE 06/13/2007  . CVA 06/01/2008   x 2  . DIABETES MELLITUS, TYPE I, WITH OPHTHALMIC COMPLICATIONS 6/38/7564  . DM retinopathy ([place])   . HYPERKALEMIA 06/01/2008   due to DM neparopathy  . HYPERLIPIDEMIA 06/13/2007  . HYPERTENSION 06/13/2007  . NEOPLASM, MALIGNANT, SKIN, FACE 06/01/2008  . Other chronic nonalcoholic liver disease 3/32/9518  . RENAL DISEASE 06/01/2008    Past Surgical History:  Procedure Laterality Date  . Carotid Duplex  09/12/2004  . CATARACT EXTRACTION    . CHOLECYSTECTOMY    . ESOPHAGOGASTRODUODENOSCOPY  06/28/1991  . HERNIA REPAIR     right Inguinal hernia  . Rest/Stress Cardiolite  01/22/2001    Social History   Social History  . Marital status: Married    Spouse name: N/A  . Number of children: N/A  . Years of education: N/A   Occupational History  . Retired Retired   Social History Main Topics  . Smoking status: Former Smoker    Quit date: 11/18/1978  . Smokeless tobacco: Never Used  . Alcohol use No  . Drug use: No  . Sexual activity: No   Other Topics Concern  . Not on file   Social History Narrative  . No narrative on file    Current Outpatient Prescriptions on File Prior to Visit  Medication Sig Dispense Refill  . acetaminophen (TYLENOL) 500 MG  tablet Take 500-1,000 mg by mouth every 6 (six) hours as needed for headache (pain).    Marland Kitchen amLODipine (NORVASC) 2.5 MG tablet Take 1 tablet (2.5 mg total) by mouth daily. 30 tablet 5  . aspirin 500 MG EC tablet Take 500 mg by mouth daily.    . B-D INS SYR ULTRAFINE .3CC/30G 30G X 1/2" 0.3 ML MISC USE AS DIRECTED 4 TIMES DAILY 300 each 1  . Blood Glucose Monitoring Suppl (ONE TOUCH ULTRA 2) W/DEVICE KIT Use to check blood sugar 2 times per day. Dx Code E11.22 1 each 2  . furosemide (LASIX) 20 MG tablet Take 1 tablet (20 mg total) by mouth daily. 30 tablet 5  . glucose blood (ONE TOUCH ULTRA TEST) test strip USE  STRIP TO CHECK GLUCOSE TWICE DAILY AS DIRECTED Dx code: E11.9 100 each 4  . hydrALAZINE (APRESOLINE) 10 MG tablet TAKE ONE TABLET BY MOUTH THREE TIMES DAILY (Patient taking differently: TAKE ONE TABLET BY MOUTH DAILY) 270 tablet 0  . metoprolol tartrate (LOPRESSOR) 25 MG tablet TAKE ONE-HALF TABLET BY MOUTH TWICE DAILY 60 tablet 0  . nitroGLYCERIN (NITROSTAT) 0.4 MG SL tablet Place 1 tablet (0.4 mg total) under the tongue every 5 (five) minutes as needed. For chest pain (Patient taking differently: Place 0.4 mg under the tongue every 5 (five) minutes as needed for chest pain. ) 25 tablet  2  . ONETOUCH DELICA LANCETS 46N MISC USE  TO CHECK GLUCOSE TWICE DAILY AS DIRECTED E11.9 100 each 13  . pravastatin (PRAVACHOL) 80 MG tablet TAKE ONE TABLET BY MOUTH ONCE DAILY 30 tablet 5   No current facility-administered medications on file prior to visit.     No Known Allergies  No family history on file.  BP 132/84   Pulse 67   Wt 188 lb 3.2 oz (85.4 kg)   SpO2 98%   BMI 25.52 kg/m    Review of Systems He denies hypoglycemia.      Objective:   Physical Exam VITAL SIGNS:  See vs page.  GENERAL: no distress.  Pulses: foot pulses are intact bilaterally.   MSK: no deformity of the feet or ankles.  CV: 1+ bilat edema of the legs, and bilat vv's.  Skin:  no ulcer on the feet or ankles.   normal color and temp on the feet and ankles.  Neuro: sensation is intact to touch on the feet and ankles, but decreased from normal.  Ext: both great toenails are absent.    Lab Results  Component Value Date   CREATININE 1.72 (H) 04/07/2017   BUN 35 (H) 04/07/2017   NA 137 04/07/2017   K 5.4 (H) 04/07/2017   CL 106 04/07/2017   CO2 28 04/07/2017   A1c=6.3%    Assessment & Plan:  Insulin-requiring type 2 DM, with: overcontrolled for this advanced age.  Renal insuff: in this setting, a1c overtimates aggressiveness of glycemic control.  Patient Instructions  Please reduce the bedtime NPH insulin to 5 units. Please continue the same humalog.  check your blood sugar twice a day.  vary the time of day when you check, between before the 3 meals, and at bedtime.  also check if you have symptoms of your blood sugar being too high or too low.  please keep a record of the readings and bring it to your next appointment here (or you can bring the meter itself).  You can write it on any piece of paper.  please call us sooner if your blood sugar goes below 70, or if you have a lot of readings over 200.   Please come back for a follow-up appointment in 3 months.

## 2017-07-07 NOTE — Patient Instructions (Addendum)
Please reduce the bedtime NPH insulin to 5 units. Please continue the same humalog.  check your blood sugar twice a day.  vary the time of day when you check, between before the 3 meals, and at bedtime.  also check if you have symptoms of your blood sugar being too high or too low.  please keep a record of the readings and bring it to your next appointment here (or you can bring the meter itself).  You can write it on any piece of paper.  please call us sooner if your blood sugar goes below 70, or if you have a lot of readings over 200.   Please come back for a follow-up appointment in 3 months.

## 2017-07-17 ENCOUNTER — Other Ambulatory Visit: Payer: Self-pay | Admitting: Endocrinology

## 2017-07-18 ENCOUNTER — Other Ambulatory Visit: Payer: Self-pay

## 2017-07-18 MED ORDER — METOPROLOL TARTRATE 25 MG PO TABS: 12.5000 mg | ORAL_TABLET | Freq: Two times a day (BID) | ORAL | 0 refills | Status: DC

## 2017-07-18 MED ORDER — FUROSEMIDE 20 MG PO TABS: 20.0000 mg | ORAL_TABLET | Freq: Every day | ORAL | 5 refills | Status: DC

## 2017-07-18 MED ORDER — AMLODIPINE BESYLATE 2.5 MG PO TABS: 2.5000 mg | ORAL_TABLET | Freq: Every day | ORAL | 5 refills | Status: DC

## 2017-08-04 ENCOUNTER — Encounter: Payer: Self-pay | Admitting: Internal Medicine

## 2017-08-04 ENCOUNTER — Ambulatory Visit (INDEPENDENT_AMBULATORY_CARE_PROVIDER_SITE_OTHER): Payer: Medicare Other | Admitting: Internal Medicine

## 2017-08-04 VITALS — BP 154/62 | HR 76 | Ht 72.0 in | Wt 191.4 lb

## 2017-08-04 DIAGNOSIS — I2510 Atherosclerotic heart disease of native coronary artery without angina pectoris: Secondary | ICD-10-CM

## 2017-08-04 DIAGNOSIS — E782 Mixed hyperlipidemia: Secondary | ICD-10-CM

## 2017-08-04 MED ORDER — METOPROLOL TARTRATE 25 MG PO TABS: 12.5000 mg | ORAL_TABLET | Freq: Every day | ORAL | 0 refills | Status: DC

## 2017-08-04 MED ORDER — NITROGLYCERIN 0.4 MG SL SUBL: 0.4000 mg | SUBLINGUAL_TABLET | SUBLINGUAL | 3 refills | Status: AC | PRN

## 2017-08-04 MED ORDER — ASPIRIN EC 81 MG PO TBEC: 81.0000 mg | DELAYED_RELEASE_TABLET | Freq: Every day | ORAL | 3 refills | Status: DC

## 2017-08-04 NOTE — Patient Instructions (Addendum)
Your physician has recommended you make the following change in your medication:  1.) change aspirin to 81 mg daily  Your physician recommends that you return for lab work in: [place] wants you to follow-up in: 6 months with [name].  You will receive a reminder letter in the mail two months in advance. If you don't receive a letter, please call our office to schedule the follow-up appointment.   Addendum: Per [name] I called patient and advised to decrease metoprolol to 12.5mg  once daily.  He verbalizes understanding and agreement with this.  Medication list updated.

## 2017-08-04 NOTE — Progress Notes (Signed)
Cardiology Office Note   Date:  08/04/2017   ID:  Chad Mcmillan, DOB 03/07/1924, MRN [id number]  PCP:  [name], MD  Cardiologist:   Dorris Carnes, MD   F/U of CAD      History of Present Illness: Chad Mcmillan is a 83 y.o. male with a history of CAD  Last cath in 2000. (LAD 50%, 70%, Diag 90% LCX 70 to 80%. LVEF 70. He had a myoview in Fall 2010.  I saw him in Aug of last year.  Since seen he denies CP  Breathing is stable  No change in activity level  I saw him in 2017 (fall) SInce then he has not had any chest pains  He is moving slower Occasionally gets shakey  Attrib to low sugar  No syncope    Outpatient Medications Prior to Visit  Medication Sig Dispense Refill  . acetaminophen (TYLENOL) 500 MG tablet Take 500-1,000 mg by mouth every 6 (six) hours as needed for headache (pain).    Marland Kitchen amLODipine (NORVASC) 2.5 MG tablet Take 1 tablet (2.5 mg total) by mouth daily. 30 tablet 5  . aspirin 500 MG EC tablet Take 500 mg by mouth daily.    . B-D INS SYR ULTRAFINE .3CC/30G 30G X 1/2" 0.3 ML MISC USE AS DIRECTED 4 TIMES DAILY 300 each 1  . Blood Glucose Monitoring Suppl (ONE TOUCH ULTRA 2) W/DEVICE KIT Use to check blood sugar 2 times per day. Dx Code E11.22 1 each 2  . furosemide (LASIX) 20 MG tablet Take 1 tablet (20 mg total) by mouth daily. 30 tablet 5  . glucose blood (ONE TOUCH ULTRA TEST) test strip USE  STRIP TO CHECK GLUCOSE TWICE DAILY AS DIRECTED Dx code: E11.9 100 each 4  . hydrALAZINE (APRESOLINE) 10 MG tablet TAKE ONE TABLET BY MOUTH THREE TIMES DAILY (Patient taking differently: TAKE ONE TABLET BY MOUTH DAILY) 270 tablet 0  . insulin lispro (HUMALOG) 100 UNIT/ML injection 3 times a day (just before each meal), 18-10-10 units 20 mL 1  . insulin NPH Human (HUMULIN N) 100 UNIT/ML injection Inject 0.05 mLs (5 Units total) into the skin at bedtime. 10 mL 2  . metoprolol tartrate (LOPRESSOR) 25 MG tablet TAKE ONE-HALF TABLET BY MOUTH TWICE DAILY 60 tablet 0  .  nitroGLYCERIN (NITROSTAT) 0.4 MG SL tablet Place 1 tablet (0.4 mg total) under the tongue every 5 (five) minutes as needed. For chest pain (Patient taking differently: Place 0.4 mg under the tongue every 5 (five) minutes as needed for chest pain. ) 25 tablet 2  . ONETOUCH DELICA LANCETS 92K MISC USE  TO CHECK GLUCOSE TWICE DAILY AS DIRECTED E11.9 100 each 13  . pravastatin (PRAVACHOL) 80 MG tablet TAKE ONE TABLET BY MOUTH ONCE DAILY 30 tablet 5  . metoprolol tartrate (LOPRESSOR) 25 MG tablet Take 0.5 tablets (12.5 mg total) by mouth 2 (two) times daily. (Patient not taking: Reported on 08/04/2017) 60 tablet 0   No facility-administered medications prior to visit.      Allergies:   Patient has no known allergies.   Past Medical History:  Diagnosis Date  . ADENOCARCINOMA, PROSTATE 06/01/2008  . CORONARY ARTERY DISEASE 06/13/2007  . CVA 06/01/2008   x 2  . DIABETES MELLITUS, TYPE I, WITH OPHTHALMIC COMPLICATIONS 4/62/8638  . DM retinopathy ([place])   . HYPERKALEMIA 06/01/2008   due to DM neparopathy  . HYPERLIPIDEMIA 06/13/2007  . HYPERTENSION 06/13/2007  . NEOPLASM, MALIGNANT, SKIN, FACE 06/01/2008  .  Other chronic nonalcoholic liver disease 2/77/8242  . RENAL DISEASE 06/01/2008    Past Surgical History:  Procedure Laterality Date  . Carotid Duplex  09/12/2004  . CATARACT EXTRACTION    . CHOLECYSTECTOMY    . ESOPHAGOGASTRODUODENOSCOPY  06/28/1991  . HERNIA REPAIR     right Inguinal hernia  . Rest/Stress Cardiolite  01/22/2001     Social History:  The patient  reports that he quit smoking about 38 years ago. He has never used smokeless tobacco. He reports that he does not drink alcohol or use drugs.   Family History:  The patient's family history is not on file.    ROS:  Please see the history of present illness. All other systems are reviewed and  Negative to the above problem except as noted.    PHYSICAL EXAM: VS:  BP (!) 154/62   Pulse 76   Ht 6' (1.829 m)   Wt 191 lb 6.4 oz  (86.8 kg)   BMI 25.96 kg/m   GEN: Well nourished, well developed, in no acute distress   Moving slow to table   HEENT: normal  Neck: no JVD, carotid bruits, or masses Cardiac: RRR; no murmurs, rubs, or gallops,no edema  Respiratory:  clear to auscultation bilaterally, normal work of breathing GI: soft, nontender, nondistended, + BS  No hepatomegaly  MS: no deformity Moving all extremities   Skin: warm and dry, no rash Neuro:  Strength and sensation are intact    EKG:  EKG is ordered today.  SR 76  First degree AV block  PR 328  LBBB   Lipid Panel    Component Value Date/Time   CHOL 142 04/07/2017 1355   TRIG 87.0 04/07/2017 1355   TRIG 51 09/29/2006 0801   HDL 38.30 (L) 04/07/2017 1355   CHOLHDL 4 04/07/2017 1355   VLDL 17.4 04/07/2017 1355   LDLCALC 86 04/07/2017 1355      Wt Readings from Last 3 Encounters:  08/04/17 191 lb 6.4 oz (86.8 kg)  07/07/17 188 lb 3.2 oz (85.4 kg)  04/07/17 193 lb (87.5 kg)      ASSESSMENT AND PLAN:  1  CAD   Pt doing OK  Slower    No symptoms of angina I would back down metoprolol to qd Follow BP and HR    2.  HL  Keep on statin  F/U in December      Signed, Marveline Profeta, MD  08/04/2017 12:13 PM    [facility] [address], [place]  [address] Phone: [phone]; Fax: [phone]

## 2017-08-05 LAB — CBC
HEMATOCRIT: 37.5 % (ref 37.5–51.0)
HEMOGLOBIN: 12.3 g/dL — AB (ref 13.0–17.7)
MCH: 31 pg (ref 26.6–33.0)
MCHC: 32.8 g/dL (ref 31.5–35.7)
MCV: 95 fL (ref 79–97)
PLATELETS: 137 10*3/uL — AB (ref 150–379)
RBC: 3.97 x10E6/uL — AB (ref 4.14–5.80)
RDW: 13.7 % (ref 12.3–15.4)
WBC: 8.9 10*3/uL (ref 3.4–10.8)

## 2017-08-05 LAB — BASIC METABOLIC PANEL
BUN/Creatinine Ratio: 23 (ref 10–24)
BUN: 36 mg/dL (ref 10–36)
CALCIUM: 9 mg/dL (ref 8.6–10.2)
CHLORIDE: 103 mmol/L (ref 96–106)
CO2: 21 mmol/L (ref 20–29)
CREATININE: 1.6 mg/dL — AB (ref 0.76–1.27)
GFR, EST AFRICAN AMERICAN: 43 mL/min/{1.73_m2} — AB (ref >59)
GFR, EST NON AFRICAN AMERICAN: 37 mL/min/{1.73_m2} — AB (ref >59)
Glucose: 60 mg/dL — ABNORMAL LOW (ref 65–99)
Potassium: 5 mmol/L (ref 3.5–5.2)
Sodium: 142 mmol/L (ref 134–144)

## 2017-08-19 ENCOUNTER — Ambulatory Visit (INDEPENDENT_AMBULATORY_CARE_PROVIDER_SITE_OTHER): Payer: Medicare Other

## 2017-08-19 DIAGNOSIS — Z23 Encounter for immunization: Secondary | ICD-10-CM | POA: Diagnosis not present

## 2017-08-22 ENCOUNTER — Other Ambulatory Visit: Payer: Self-pay | Admitting: Endocrinology

## 2017-10-07 ENCOUNTER — Other Ambulatory Visit: Payer: Self-pay

## 2017-10-07 ENCOUNTER — Ambulatory Visit (INDEPENDENT_AMBULATORY_CARE_PROVIDER_SITE_OTHER): Payer: Medicare Other | Admitting: Endocrinology

## 2017-10-07 ENCOUNTER — Encounter: Payer: Self-pay | Admitting: Endocrinology

## 2017-10-07 VITALS — BP 132/80 | HR 88 | Wt 191.0 lb

## 2017-10-07 DIAGNOSIS — M79672 Pain in left foot: Secondary | ICD-10-CM

## 2017-10-07 DIAGNOSIS — E1122 Type 2 diabetes mellitus with diabetic chronic kidney disease: Secondary | ICD-10-CM | POA: Diagnosis not present

## 2017-10-07 DIAGNOSIS — Z794 Long term (current) use of insulin: Secondary | ICD-10-CM | POA: Diagnosis not present

## 2017-10-07 DIAGNOSIS — I2510 Atherosclerotic heart disease of native coronary artery without angina pectoris: Secondary | ICD-10-CM | POA: Diagnosis not present

## 2017-10-07 DIAGNOSIS — N183 Chronic kidney disease, stage 3 unspecified: Secondary | ICD-10-CM

## 2017-10-07 DIAGNOSIS — M79671 Pain in right foot: Secondary | ICD-10-CM | POA: Diagnosis not present

## 2017-10-07 LAB — POCT GLYCOSYLATED HEMOGLOBIN (HGB A1C): Hemoglobin A1C: 6.9

## 2017-10-07 MED ORDER — INSULIN LISPRO 100 UNIT/ML ~~LOC~~ SOLN: SUBCUTANEOUS | 1 refills | Status: DC

## 2017-10-07 NOTE — Progress Notes (Signed)
Subjective:    Patient ID: Chad Mcmillan, male    DOB: [birth year], 83 y.o.   MRN: [id number]  HPI Pt returns for f/u of diabetes mellitus:  DM type: Insulin-requiring type 2 Dx'ed: 1989.   Complications: renal insuff, CAD, retinopathy, polyneuropathy, and CVA.   Therapy: insulin since 2004.  DKA: never.  Severe hypoglycemia: never.  Pancreatitis: never.  Other: he takes multiple daily injections; he prefers human insulin due to cost.   Interval history: no cbg record, but states cbg's are well-controlled.  There is no trend throughout the day, except it is lowest at lunch.   Past Medical History:  Diagnosis Date  . ADENOCARCINOMA, PROSTATE 06/01/2008  . CORONARY ARTERY DISEASE 06/13/2007  . CVA 06/01/2008   x 2  . DIABETES MELLITUS, TYPE I, WITH OPHTHALMIC COMPLICATIONS 4/65/6812  . DM retinopathy ([place])   . HYPERKALEMIA 06/01/2008   due to DM neparopathy  . HYPERLIPIDEMIA 06/13/2007  . HYPERTENSION 06/13/2007  . NEOPLASM, MALIGNANT, SKIN, FACE 06/01/2008  . Other chronic nonalcoholic liver disease 7/51/7001  . RENAL DISEASE 06/01/2008    Past Surgical History:  Procedure Laterality Date  . Carotid Duplex  09/12/2004  . CATARACT EXTRACTION    . CHOLECYSTECTOMY    . ESOPHAGOGASTRODUODENOSCOPY  06/28/1991  . HERNIA REPAIR     right Inguinal hernia  . Rest/Stress Cardiolite  01/22/2001    Social History   Socioeconomic History  . Marital status: Married    Spouse name: Not on file  . Number of children: Not on file  . Years of education: Not on file  . Highest education level: Not on file  Social Needs  . Financial resource strain: Not on file  . Food insecurity - worry: Not on file  . Food insecurity - inability: Not on file  . Transportation needs - medical: Not on file  . Transportation needs - non-medical: Not on file  Occupational History  . Occupation: Retired    Fish farm manager: RETIRED  Tobacco Use  . Smoking status: Former Smoker    Last attempt to quit: 11/18/1978      Years since quitting: 38.9  . Smokeless tobacco: Never Used  Substance and Sexual Activity  . Alcohol use: No  . Drug use: No  . Sexual activity: No    Birth control/protection: None  Other Topics Concern  . Not on file  Social History Narrative  . Not on file    Current Outpatient Medications on File Prior to Visit  Medication Sig Dispense Refill  . acetaminophen (TYLENOL) 500 MG tablet Take 500-1,000 mg by mouth every 6 (six) hours as needed for headache (pain).    Marland Kitchen amLODipine (NORVASC) 2.5 MG tablet Take 1 tablet (2.5 mg total) by mouth daily. 30 tablet 5  . aspirin EC 81 MG tablet Take 1 tablet (81 mg total) by mouth daily. 90 tablet 3  . B-D INS SYR ULTRAFINE .3CC/30G 30G X 1/2" 0.3 ML MISC USE AS DIRECTED 4 TIMES DAILY 300 each 1  . Blood Glucose Monitoring Suppl (ONE TOUCH ULTRA 2) W/DEVICE KIT Use to check blood sugar 2 times per day. Dx Code E11.22 1 each 2  . furosemide (LASIX) 20 MG tablet Take 1 tablet (20 mg total) by mouth daily. 30 tablet 5  . glucose blood (ONE TOUCH ULTRA TEST) test strip USE  STRIP TO CHECK GLUCOSE TWICE DAILY AS DIRECTED Dx code: E11.9 100 each 4  . hydrALAZINE (APRESOLINE) 10 MG tablet TAKE ONE TABLET BY MOUTH THREE  TIMES DAILY (Patient taking differently: TAKE ONE TABLET BY MOUTH DAILY) 270 tablet 0  . insulin NPH Human (HUMULIN N) 100 UNIT/ML injection Inject 0.05 mLs (5 Units total) into the skin at bedtime. 10 mL 2  . metoprolol tartrate (LOPRESSOR) 25 MG tablet Take 0.5 tablets (12.5 mg total) by mouth daily. 60 tablet 0  . nitroGLYCERIN (NITROSTAT) 0.4 MG SL tablet Place 1 tablet (0.4 mg total) under the tongue every 5 (five) minutes as needed. For chest pain 25 tablet 3  . ONETOUCH DELICA LANCETS 88B MISC USE  TO CHECK GLUCOSE TWICE DAILY AS DIRECTED E11.9 100 each 13  . pravastatin (PRAVACHOL) 80 MG tablet TAKE ONE TABLET BY MOUTH ONCE DAILY 30 tablet 5   No current facility-administered medications on file prior to visit.     No Known  Allergies  History reviewed. No pertinent family history.  BP 132/80 (BP Location: Left Arm, Patient Position: Sitting)   Pulse 88   Wt 191 lb (86.6 kg)   SpO2 98%   BMI 25.90 kg/m    Review of Systems He has bilat foot pain, which causes him to be unsteady on his feet.  He denies hypoglycemia    Objective:   Physical Exam VITAL SIGNS:  See vs page.  GENERAL: no distress.  Pulses: foot pulses are intact bilaterally.   MSK: no deformity of the feet or ankles.  CV: 1+ bilat edema of the legs, and bilat vv's.  Skin:  no ulcer on the feet or ankles.  normal color and temp on the feet and ankles.  Neuro: sensation is intact to touch on the feet and ankles, but decreased from normal.  Ext: both great toenails are absent.   Lab Results  Component Value Date   HGBA1C 6.9 10/07/2017       Assessment & Plan:  Insulin-requiring type 2 DM: overcontrolled Foot pain, new, uncertain etiology   Patient Instructions  Please reduce the breakfast humalog insulin to 15 units. Please continue the same NPH. Please see a foot specialist.  you will receive a phone call, about a day and time for an appointment check your blood sugar twice a day.  vary the time of day when you check, between before the 3 meals, and at bedtime.  also check if you have symptoms of your blood sugar being too high or too low.  please keep a record of the readings and bring it to your next appointment here (or you can bring the meter itself).  You can write it on any piece of paper.  please call us sooner if your blood sugar goes below 70, or if you have a lot of readings over 200.   Please come back for a follow-up appointment in 3 months.

## 2017-10-07 NOTE — Patient Instructions (Addendum)
Please reduce the breakfast humalog insulin to 15 units. Please continue the same NPH. Please see a foot specialist.  you will receive a phone call, about a day and time for an appointment check your blood sugar twice a day.  vary the time of day when you check, between before the 3 meals, and at bedtime.  also check if you have symptoms of your blood sugar being too high or too low.  please keep a record of the readings and bring it to your next appointment here (or you can bring the meter itself).  You can write it on any piece of paper.  please call us sooner if your blood sugar goes below 70, or if you have a lot of readings over 200.   Please come back for a follow-up appointment in 3 months.

## 2017-10-23 ENCOUNTER — Ambulatory Visit: Payer: Medicare Other | Admitting: Podiatry

## 2017-11-15 ENCOUNTER — Emergency Department (HOSPITAL_COMMUNITY)
Admission: EM | Admit: 2017-11-15 | Discharge: 2017-11-15 | Disposition: A | Discharge status: Home / Self Care | Payer: Medicare Other | Attending: Emergency Medicine | Admitting: Emergency Medicine

## 2017-11-15 ENCOUNTER — Encounter (HOSPITAL_COMMUNITY): Payer: Self-pay

## 2017-11-15 ENCOUNTER — Emergency Department (HOSPITAL_COMMUNITY): Payer: Medicare Other

## 2017-11-15 ENCOUNTER — Other Ambulatory Visit: Payer: Self-pay

## 2017-11-15 DIAGNOSIS — Z87891 Personal history of nicotine dependence: Secondary | ICD-10-CM | POA: Diagnosis not present

## 2017-11-15 DIAGNOSIS — I2510 Atherosclerotic heart disease of native coronary artery without angina pectoris: Secondary | ICD-10-CM | POA: Diagnosis not present

## 2017-11-15 DIAGNOSIS — N183 Chronic kidney disease, stage 3 unspecified: Secondary | ICD-10-CM

## 2017-11-15 DIAGNOSIS — Z79899 Other long term (current) drug therapy: Secondary | ICD-10-CM | POA: Diagnosis not present

## 2017-11-15 DIAGNOSIS — Z8673 Personal history of transient ischemic attack (TIA), and cerebral infarction without residual deficits: Secondary | ICD-10-CM | POA: Insufficient documentation

## 2017-11-15 DIAGNOSIS — Z85828 Personal history of other malignant neoplasm of skin: Secondary | ICD-10-CM | POA: Insufficient documentation

## 2017-11-15 DIAGNOSIS — R42 Dizziness and giddiness: Secondary | ICD-10-CM | POA: Insufficient documentation

## 2017-11-15 DIAGNOSIS — D696 Thrombocytopenia, unspecified: Secondary | ICD-10-CM

## 2017-11-15 DIAGNOSIS — Z9049 Acquired absence of other specified parts of digestive tract: Secondary | ICD-10-CM | POA: Insufficient documentation

## 2017-11-15 DIAGNOSIS — I129 Hypertensive chronic kidney disease with stage 1 through stage 4 chronic kidney disease, or unspecified chronic kidney disease: Secondary | ICD-10-CM | POA: Diagnosis not present

## 2017-11-15 DIAGNOSIS — Z794 Long term (current) use of insulin: Secondary | ICD-10-CM | POA: Insufficient documentation

## 2017-11-15 DIAGNOSIS — Z7982 Long term (current) use of aspirin: Secondary | ICD-10-CM | POA: Diagnosis not present

## 2017-11-15 DIAGNOSIS — E109 Type 1 diabetes mellitus without complications: Secondary | ICD-10-CM | POA: Diagnosis not present

## 2017-11-15 DIAGNOSIS — R4182 Altered mental status, unspecified: Secondary | ICD-10-CM | POA: Diagnosis present

## 2017-11-15 LAB — URINALYSIS, ROUTINE W REFLEX MICROSCOPIC
Bacteria, UA: NONE SEEN
Bilirubin Urine: NEGATIVE
GLUCOSE, UA: 50 mg/dL — AB
HGB URINE DIPSTICK: NEGATIVE
Ketones, ur: NEGATIVE mg/dL
Leukocytes, UA: NEGATIVE
Nitrite: NEGATIVE
PROTEIN: 100 mg/dL — AB
Specific Gravity, Urine: 1.009 (ref 1.005–1.030)
Squamous Epithelial / LPF: NONE SEEN
pH: 5 (ref 5.0–8.0)

## 2017-11-15 LAB — COMPREHENSIVE METABOLIC PANEL
ALK PHOS: 96 U/L (ref 38–126)
ALT: 17 U/L (ref 17–63)
AST: 27 U/L (ref 15–41)
Albumin: 3.4 g/dL — ABNORMAL LOW (ref 3.5–5.0)
Anion gap: 6 (ref 5–15)
BILIRUBIN TOTAL: 1 mg/dL (ref 0.3–1.2)
BUN: 35 mg/dL — AB (ref 6–20)
CALCIUM: 8.6 mg/dL — AB (ref 8.9–10.3)
CO2: 24 mmol/L (ref 22–32)
CREATININE: 2.1 mg/dL — AB (ref 0.61–1.24)
Chloride: 106 mmol/L (ref 101–111)
GFR calc Af Amer: 30 mL/min — ABNORMAL LOW (ref >60)
GFR, EST NON AFRICAN AMERICAN: 26 mL/min — AB (ref >60)
Glucose, Bld: 266 mg/dL — ABNORMAL HIGH (ref 65–99)
POTASSIUM: 5.5 mmol/L — AB (ref 3.5–5.1)
Sodium: 136 mmol/L (ref 135–145)
Total Protein: 6.5 g/dL (ref 6.5–8.1)

## 2017-11-15 LAB — CBC
HCT: 34.7 % — ABNORMAL LOW (ref 39.0–52.0)
Hemoglobin: 11.7 g/dL — ABNORMAL LOW (ref 13.0–17.0)
MCH: 32.6 pg (ref 26.0–34.0)
MCHC: 33.7 g/dL (ref 30.0–36.0)
MCV: 96.7 fL (ref 78.0–100.0)
PLATELETS: 104 10*3/uL — AB (ref 150–400)
RBC: 3.59 MIL/uL — AB (ref 4.22–5.81)
RDW: 13.8 % (ref 11.5–15.5)
WBC: 5.3 10*3/uL (ref 4.0–10.5)

## 2017-11-15 LAB — I-STAT TROPONIN, ED: TROPONIN I, POC: 0.02 ng/mL (ref 0.00–0.08)

## 2017-11-15 LAB — CBG MONITORING, ED: Glucose-Capillary: 184 mg/dL — ABNORMAL HIGH (ref 65–99)

## 2017-11-15 MED ORDER — SODIUM CHLORIDE 0.9 % IV BOLUS (SEPSIS)
500.0000 mL | Freq: Once | INTRAVENOUS | Status: AC
  Administered 2017-11-15: 500 mL via INTRAVENOUS

## 2017-11-15 MED ORDER — MECLIZINE HCL 12.5 MG PO TABS: 12.5000 mg | ORAL_TABLET | Freq: Three times a day (TID) | ORAL | 0 refills | Status: DC | PRN

## 2017-11-15 MED ORDER — MECLIZINE HCL 25 MG PO TABS
12.5000 mg | ORAL_TABLET | Freq: Once | ORAL | Status: AC
  Administered 2017-11-15: 12.5 mg via ORAL
  Filled 2017-11-15: qty 1

## 2017-11-15 NOTE — ED Notes (Signed)
PT AWARE OF NEED FOR URINE

## 2017-11-15 NOTE — Discharge Instructions (Signed)
It was our pleasure to provide your ER care today - we hope that you feel better.  Rest. Drink plenty of fluids.  From today's lab tests, your kidney function (creatinine 2.1) is increased from prior (when creatinine was 1.7), and your potassium level is mildly high (5.5).  Your platelet count is low (104).  We gave you iv fluids in ED.  We also noted that at times your heart rate is slow (50's).  For now, hold/stop your lasix and metoprolol.  Follow up with your doctor in the next 2-3 days for recheck of your kidney function and potassium levels.   You may also try taking antivert as need for dizziness - may make drowsy, no driving when taking.   Return to ER if worse, new symptoms, fevers, vomiting, new or severe pain, other concern.

## 2017-11-15 NOTE — ED Notes (Signed)
Bed: SW97 Expected date: 11/15/17 Expected time: 1:59 PM Means of arrival: Ambulance Comments: Weeks of gen weakness 83 yo

## 2017-11-15 NOTE — ED Notes (Signed)
EKG DONE

## 2017-11-15 NOTE — ED Provider Notes (Signed)
[facility] DEPT Provider Note   CSN: [id number] Arrival date & time: 11/15/17  1414     History   Chief Complaint Chief Complaint  Patient presents with  . Altered Mental Status  . Dizziness    HPI Chad Mcmillan is a 83 y.o. male.  Patient noted by family with increased confusion in past day, and appearing generally weak.  Patient on arrival to ED is awake and alert, oriented. Patient states when he stands up quickly for the past several months he periodically gets dizzy, which he describes as both a mild room spinning sensation, and faint sensation. No syncope. Denies falling. No change in balance or gait. Denies unilateral numbness or weakness. Pt denies change in speech or vision. Pt denies any recent change in meds or new meds. He denies pain - no headaches, no chest pain or discomfort, no abd pain. No recent blood loss or melena. No fever or chills. No gu c/o.    The history is provided by the patient and the EMS personnel.  Altered Mental Status   Associated symptoms include confusion.  Dizziness  Associated symptoms: no blood in stool, no chest pain, no headaches, no shortness of breath and no vomiting     Past Medical History:  Diagnosis Date  . ADENOCARCINOMA, PROSTATE 06/01/2008  . CORONARY ARTERY DISEASE 06/13/2007  . CVA 06/01/2008   x 2  . DIABETES MELLITUS, TYPE I, WITH OPHTHALMIC COMPLICATIONS 1/65/7903  . 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 HYPERKALEMIA 06/01/2008   due to DM neparopathy  . HYPERLIPIDEMIA 06/13/2007  . HYPERTENSION 06/13/2007  . NEOPLASM, MALIGNANT, SKIN, FACE 06/01/2008  . Other chronic nonalcoholic liver disease 8/33/3832  . RENAL DISEASE 06/01/2008    Patient Active Problem List   Diagnosis Date Noted  . Foot pain, bilateral 10/07/2017  . Memory loss 04/07/2017  . Numbness 03/06/2016  . Dizzy 01/31/2016  . Medication side effect 03/08/2015  . Pancytopenia ([place]) 07/27/2012  . Screening for prostate cancer 07/27/2012    . DM (diabetes mellitus) ([place]) 07/27/2012  . Anemia 07/27/2012  . Encounter for long-term (current) use of other medications 07/23/2011  . NASH (nonalcoholic steatohepatitis) 07/17/2010  . COUGH 02/06/2010  . PNEUMONIA, ORGANISM UNSPECIFIED 11/27/2009  . NEOPLASM, MALIGNANT, SKIN, FACE 06/01/2008  . ADENOCARCINOMA, PROSTATE 06/01/2008  . HYPERKALEMIA 06/01/2008  . CVA 06/01/2008  . Disorder of kidney and ureter 06/01/2008  . NEOPLASM, MALIGNANT, SKIN, FACE 06/01/2008  . Dyslipidemia 06/13/2007  . Essential hypertension 06/13/2007  . Coronary atherosclerosis 06/13/2007    Past Surgical History:  Procedure Laterality Date  . Carotid Duplex  09/12/2004  . CATARACT EXTRACTION    . CHOLECYSTECTOMY    . ESOPHAGOGASTRODUODENOSCOPY  06/28/1991  . HERNIA REPAIR     right Inguinal hernia  . Rest/Stress Cardiolite  01/22/2001       Home Medications    Prior to Admission medications   Medication Sig Start Date End Date Taking? Authorizing Provider  acetaminophen (TYLENOL) 500 MG tablet Take 500-1,000 mg by mouth every 6 (six) hours as needed for headache (pain).    [provider]  amLODipine (NORVASC) 2.5 MG tablet Take 1 tablet (2.5 mg total) by mouth daily. 07/18/17   [name], MD  aspirin EC 81 MG tablet Take 1 tablet (81 mg total) by mouth daily. 08/04/17   [name], MD  B-D INS SYR ULTRAFINE .3CC/30G 30G X 1/2" 0.3 ML MISC USE AS DIRECTED 4 TIMES DAILY 04/24/15   [name],  MD  Blood Glucose Monitoring Suppl (ONE TOUCH ULTRA 2) W/DEVICE KIT Use to check blood sugar 2 times per day. Dx Code E11.22 06/09/15   [name], MD  furosemide (LASIX) 20 MG tablet Take 1 tablet (20 mg total) by mouth daily. 07/18/17   [name], MD  glucose blood (ONE TOUCH ULTRA TEST) test strip USE  STRIP TO CHECK GLUCOSE TWICE DAILY AS DIRECTED Dx code: E11.9 07/19/16   [name], MD  hydrALAZINE (APRESOLINE) 10 MG tablet TAKE ONE TABLET BY MOUTH THREE TIMES DAILY Patient  taking differently: TAKE ONE TABLET BY MOUTH DAILY 03/10/17   [name], MD  insulin lispro (HUMALOG) 100 UNIT/ML injection 3 times a day (just before each meal), 15-10-10 units 10/07/17   [name], MD  insulin NPH Human (HUMULIN N) 100 UNIT/ML injection Inject 0.05 mLs (5 Units total) into the skin at bedtime. 07/07/17   [name], MD  metoprolol tartrate (LOPRESSOR) 25 MG tablet Take 0.5 tablets (12.5 mg total) by mouth daily. 08/04/17   [name], MD  nitroGLYCERIN (NITROSTAT) 0.4 MG SL tablet Place 1 tablet (0.4 mg total) under the tongue every 5 (five) minutes as needed. For chest pain 08/04/17   [name], MD  [facility] DELICA LANCETS 16S MISC USE  TO CHECK GLUCOSE TWICE DAILY AS DIRECTED E11.9 07/17/16   [name], MD  pravastatin (PRAVACHOL) 80 MG tablet TAKE ONE TABLET BY MOUTH ONCE DAILY 03/10/17   [name], MD    Family History No family history on file.  Social History Social History   Tobacco Use  . Smoking status: Former Smoker    Last attempt to quit: 11/18/1978    Years since quitting: 39.0  . Smokeless tobacco: Never Used  Substance Use Topics  . Alcohol use: No  . Drug use: No     Allergies   Patient has no known allergies.   Review of Systems Review of Systems  Constitutional: Negative for fever.  HENT: Negative for sore throat.   Eyes: Negative for visual disturbance.  Respiratory: Negative for cough and shortness of breath.   Cardiovascular: Negative for chest pain.  Gastrointestinal: Negative for abdominal pain, blood in stool and vomiting.  Endocrine: Negative for polyuria.  Genitourinary: Negative for dysuria and flank pain.  Musculoskeletal: Negative for back pain.  Skin: Negative for rash.  Neurological: Positive for dizziness. Negative for speech difficulty, numbness and headaches.  Hematological: Does not bruise/bleed easily.  Psychiatric/Behavioral: Positive for confusion.     Physical Exam Updated Vital Signs BP (!)  155/95 (BP Location: Left Arm)   Pulse 65   Temp (!) 97.5 F (36.4 C) (Oral)   Resp 17   SpO2 98%   Physical Exam  Constitutional: He appears well-developed and well-nourished. No distress.  HENT:  Head: Atraumatic.  Mouth/Throat: Oropharynx is clear and moist.  Eyes: Conjunctivae are normal.  Neck: Neck supple. No tracheal deviation present. No thyromegaly present.  No bruits.   Cardiovascular: Normal rate, regular rhythm, normal heart sounds and intact distal pulses. Exam reveals no gallop and no friction rub.  No murmur heard. Pulmonary/Chest: Effort normal and breath sounds normal. No accessory muscle usage. No respiratory distress.  Abdominal: Soft. Bowel sounds are normal. He exhibits no distension. There is no tenderness.  Genitourinary:  Genitourinary Comments: No cva tenderness.   Musculoskeletal: He exhibits no edema or tenderness.  Neurological: He is alert.  Alert, oriented. Speech clear/fluent. Motor intact bil, stre 5/5. No pronator drift. sens grossly  intact.   Skin: Skin is warm and dry. No rash noted. He is not diaphoretic.  Psychiatric: He has a normal mood and affect.  Nursing note and vitals reviewed.    ED Treatments / Results  Labs (all labs ordered are listed, but only abnormal results are displayed) Results for orders placed or performed during the hospital encounter of 11/15/17  CBC  Result Value Ref Range   WBC 5.3 4.0 - 10.5 K/uL   RBC 3.59 (L) 4.22 - 5.81 MIL/uL   Hemoglobin 11.7 (L) 13.0 - 17.0 g/dL   HCT 34.7 (L) 39.0 - 52.0 %   MCV 96.7 78.0 - 100.0 fL   MCH 32.6 26.0 - 34.0 pg   MCHC 33.7 30.0 - 36.0 g/dL   RDW 13.8 11.5 - 15.5 %   Platelets 104 (L) 150 - 400 K/uL  Urinalysis, Routine w reflex microscopic  Result Value Ref Range   Color, Urine YELLOW YELLOW   APPearance CLEAR CLEAR   Specific Gravity, Urine 1.009 1.005 - 1.030   pH 5.0 5.0 - 8.0   Glucose, UA 50 (A) NEGATIVE mg/dL   Hgb urine dipstick NEGATIVE NEGATIVE   Bilirubin  Urine NEGATIVE NEGATIVE   Ketones, ur NEGATIVE NEGATIVE mg/dL   Protein, ur 100 (A) NEGATIVE mg/dL   Nitrite NEGATIVE NEGATIVE   Leukocytes, UA NEGATIVE NEGATIVE   RBC / HPF 0-5 0 - 5 RBC/hpf   WBC, UA 0-5 0 - 5 WBC/hpf   Bacteria, UA NONE SEEN NONE SEEN   Squamous Epithelial / LPF NONE SEEN NONE SEEN  Comprehensive metabolic panel  Result Value Ref Range   Sodium 136 135 - 145 mmol/L   Potassium 5.5 (H) 3.5 - 5.1 mmol/L   Chloride 106 101 - 111 mmol/L   CO2 24 22 - 32 mmol/L   Glucose, Bld 266 (H) 65 - 99 mg/dL   BUN 35 (H) 6 - 20 mg/dL   Creatinine, Ser 2.10 (H) 0.61 - 1.24 mg/dL   Calcium 8.6 (L) 8.9 - 10.3 mg/dL   Total Protein 6.5 6.5 - 8.1 g/dL   Albumin 3.4 (L) 3.5 - 5.0 g/dL   AST 27 15 - 41 U/L   ALT 17 17 - 63 U/L   Alkaline Phosphatase 96 38 - 126 U/L   Total Bilirubin 1.0 0.3 - 1.2 mg/dL   GFR calc non Af Amer 26 (L) >60 mL/min   GFR calc Af Amer 30 (L) >60 mL/min   Anion gap 6 5 - 15  I-Stat Troponin, ED (not at [facility])  Result Value Ref Range   Troponin i, poc 0.02 0.00 - 0.08 ng/mL   Comment 3           Ct Head Wo Contrast  Result Date: 11/15/2017 CLINICAL DATA:  Altered level of consciousness. EXAM: CT HEAD WITHOUT CONTRAST TECHNIQUE: Contiguous axial images were obtained from the base of the skull through the vertex without intravenous contrast. COMPARISON:  05/11/2017 FINDINGS: Brain: Prominence of the sulci and ventricles identified compatible with brain atrophy. There is mild diffuse low-attenuation within the subcortical and periventricular white matter compatible with chronic microvascular disease. No evidence of acute infarction, hemorrhage, hydrocephalus, extra-axial collection or mass lesion/mass effect. Vascular: No hyperdense vessel or unexpected calcification. Skull: Normal. Negative for fracture or focal lesion. Sinuses/Orbits: Mild chronic mucosal thickening involving the right maxillary sinus is again noted. The mastoid air cells are clear. Other: None  IMPRESSION: 1. No acute intracranial abnormalities. 2. Chronic small vessel ischemic change and  brain atrophy. Electronically Signed   By: Kerby Moors M.D.   On: 11/15/2017 16:07    EKG  EKG Interpretation  Date/Time:  Saturday November 15 2017 14:52:02 EST Ventricular Rate:  66 PR Interval:    QRS Duration: 146 QT Interval:  428 QTC Calculation: 449 R Axis:   -45 Text Interpretation:  Sinus rhythm Prolonged PR interval Left bundle branch block Non-specific ST-t changes Confirmed by Lajean Saver [phone]) on 11/15/2017 3:07:31 PM       Radiology No results found.  Procedures Procedures (including critical care time)  Medications Ordered in ED Medications  meclizine (ANTIVERT) tablet 12.5 mg (not administered)     Initial Impression / Assessment and Plan / ED Course  I have reviewed the triage vital signs and the nursing notes.  Pertinent labs & imaging results that were available during my care of the patient were reviewed by me and considered in my medical decision making (see chart for details).  Iv ns. Labs. Ct.  Reviewed nursing notes and prior charts for additional history.   Prior renal fxn/creatinine 1.7 - today 2.1 ?recent relatively poor po intake.  Iv ns boluses in ED.    antivert po. Po fluids.  Meal tray.  Pt eating/drinking. Sitting upright/standing - feels improved.  On review chart, prior outpt workup of vertigo noted, including mri.   Pt indicates feels much improved.   Discussed todays labs, low plt (similar to prior), increased cr, and mildly elevated k.  Pt was hydrated in ED.  On monitor, hr as low as low 50s/sinus.  For now, will hold bblocker and lasix, and recommend close pcp f/u in the next few days for recheck, and recheck of labs/bmet.   Pt currently appears stable for d/c.     Final Clinical Impressions(s) / ED Diagnoses   Final diagnoses:  None    ED Discharge Orders    None       Lajean Saver, MD 11/15/17 [id number]

## 2017-11-15 NOTE — ED Triage Notes (Addendum)
Per [facility] Pt resides at home. Primary caregiver for wife. Son noticed today sudden onset of increased confusion. Pt states increased dizziness. EMS- positive orthostatics. EKG left BBB irregular. Pt with no cardiac HX. Son states his diabetic doctor is treating him however not currently being seen by PCP or cardiac. . NEG STROKE

## 2017-11-17 ENCOUNTER — Telehealth: Payer: Self-pay | Admitting: Endocrinology

## 2017-11-17 NOTE — Telephone Encounter (Signed)
Yes, please make the med adjustments they advsed at ER.  needs f/u appt on 11/20/17

## 2017-11-17 NOTE — Telephone Encounter (Signed)
Pt son stated pt ended up in ER on Saturday.  Pt son needs to talk to someone about instruction about paper they received Stating they need to test kidney function and potasium levels Also stated hospital wanted to stop some medication. And added meclizine script    Please Advise [phone]

## 2017-11-19 NOTE — Telephone Encounter (Signed)
I called and spoke with patient's daughter in law & made appt. 1/03 at 1:30.

## 2017-11-20 ENCOUNTER — Encounter: Payer: Self-pay | Admitting: Endocrinology

## 2017-11-20 ENCOUNTER — Ambulatory Visit (INDEPENDENT_AMBULATORY_CARE_PROVIDER_SITE_OTHER): Payer: Medicare Other | Admitting: Endocrinology

## 2017-11-20 VITALS — BP 162/86 | HR 87 | Wt 189.6 lb

## 2017-11-20 DIAGNOSIS — I10 Essential (primary) hypertension: Secondary | ICD-10-CM

## 2017-11-20 LAB — BASIC METABOLIC PANEL
BUN: 35 mg/dL — ABNORMAL HIGH (ref 6–23)
CO2: 29 meq/L (ref 19–32)
Calcium: 8.7 mg/dL (ref 8.4–10.5)
Chloride: 105 mEq/L (ref 96–112)
Creatinine, Ser: 1.82 mg/dL — ABNORMAL HIGH (ref 0.40–1.50)
GFR: 37.25 mL/min — ABNORMAL LOW (ref >60.00)
Glucose, Bld: 121 mg/dL — ABNORMAL HIGH (ref 70–99)
POTASSIUM: 5.3 meq/L — AB (ref 3.5–5.1)
SODIUM: 139 meq/L (ref 135–145)

## 2017-11-20 MED ORDER — MECLIZINE HCL 12.5 MG PO TABS: 12.5000 mg | ORAL_TABLET | Freq: Three times a day (TID) | ORAL | 3 refills | Status: DC | PRN

## 2017-11-20 NOTE — Patient Instructions (Addendum)
Please continue the same medications (please stay off the furosemide and metoprolol).   blood tests are requested for you today.  We'll let you know about the results.   Please come back for a follow-up appointment in 1 month.

## 2017-11-20 NOTE — Progress Notes (Signed)
Subjective:    Patient ID: Chad Mcmillan, male    DOB: Aug 04, 1924, 84 y.o.   MRN: [id number]  HPI Pt was seen in ER 5 days ago, with bradycardia and renal insuff.  b-blocker was stopped.  He is here with son Pookela Sellin), who provides hx, due to pt's memory loss.  Mental status is improved.  He still has slight dizziness sensation in the head, but no assoc LOC.  He says he is steadily improving.  Past Medical History:  Diagnosis Date  . ADENOCARCINOMA, PROSTATE 06/01/2008  . CORONARY ARTERY DISEASE 06/13/2007  . CVA 06/01/2008   x 2  . DIABETES MELLITUS, TYPE I, WITH OPHTHALMIC COMPLICATIONS 6/38/7564  . DM retinopathy ([employer])   . HYPERKALEMIA 06/01/2008   due to DM neparopathy  . HYPERLIPIDEMIA 06/13/2007  . HYPERTENSION 06/13/2007  . NEOPLASM, MALIGNANT, SKIN, FACE 06/01/2008  . Other chronic nonalcoholic liver disease 3/32/9518  . RENAL DISEASE 06/01/2008    Past Surgical History:  Procedure Laterality Date  . Carotid Duplex  09/12/2004  . CATARACT EXTRACTION    . CHOLECYSTECTOMY    . ESOPHAGOGASTRODUODENOSCOPY  06/28/1991  . HERNIA REPAIR     right Inguinal hernia  . Rest/Stress Cardiolite  01/22/2001    Social History   Socioeconomic History  . Marital status: Married    Spouse name: Not on file  . Number of children: Not on file  . Years of education: Not on file  . Highest education level: Not on file  Social Needs  . Financial resource strain: Not on file  . Food insecurity - worry: Not on file  . Food insecurity - inability: Not on file  . Transportation needs - medical: Not on file  . Transportation needs - non-medical: Not on file  Occupational History  . Occupation: Retired    Fish farm manager: RETIRED  Tobacco Use  . Smoking status: Former Smoker    Last attempt to quit: 11/18/1978    Years since quitting: 39.0  . Smokeless tobacco: Never Used  Substance and Sexual Activity  . Alcohol use: No  . Drug use: No  . Sexual activity: No    Birth control/protection: None   Other Topics Concern  . Not on file  Social History Narrative  . Not on file    Current Outpatient Medications on File Prior to Visit  Medication Sig Dispense Refill  . acetaminophen (TYLENOL) 500 MG tablet Take 500-1,000 mg by mouth every 6 (six) hours as needed for headache (pain).    Marland Kitchen amLODipine (NORVASC) 2.5 MG tablet Take 1 tablet (2.5 mg total) by mouth daily. 30 tablet 5  . aspirin EC 81 MG tablet Take 1 tablet (81 mg total) by mouth daily. 90 tablet 3  . B-D INS SYR ULTRAFINE .3CC/30G 30G X 1/2" 0.3 ML MISC USE AS DIRECTED 4 TIMES DAILY 300 each 1  . Blood Glucose Monitoring Suppl (ONE TOUCH ULTRA 2) W/DEVICE KIT Use to check blood sugar 2 times per day. Dx Code E11.22 1 each 2  . glucose blood (ONE TOUCH ULTRA TEST) test strip USE  STRIP TO CHECK GLUCOSE TWICE DAILY AS DIRECTED Dx code: E11.9 100 each 4  . hydrALAZINE (APRESOLINE) 10 MG tablet TAKE ONE TABLET BY MOUTH THREE TIMES DAILY 270 tablet 0  . insulin lispro (HUMALOG) 100 UNIT/ML injection 3 times a day (just before each meal), 15-10-10 units (Patient taking differently: Inject 3-25 Units into the skin once. 3 times a day (just before each meal), 15-10-10 units)  20 mL 1  . insulin NPH Human (HUMULIN N) 100 UNIT/ML injection Inject 0.05 mLs (5 Units total) into the skin at bedtime. 10 mL 2  . nitroGLYCERIN (NITROSTAT) 0.4 MG SL tablet Place 1 tablet (0.4 mg total) under the tongue every 5 (five) minutes as needed. For chest pain 25 tablet 3  . ONETOUCH DELICA LANCETS 75F MISC USE  TO CHECK GLUCOSE TWICE DAILY AS DIRECTED E11.9 100 each 13  . OVER THE COUNTER MEDICATION Apply 1 drop topically as needed (dizziness).    . pravastatin (PRAVACHOL) 80 MG tablet TAKE ONE TABLET BY MOUTH ONCE DAILY 30 tablet 5   No current facility-administered medications on file prior to visit.     No Known Allergies  History reviewed. No pertinent family history.  BP (!) 162/86 (BP Location: Left Arm, Patient Position: Sitting, Cuff Size:  Normal)   Pulse 87   Wt 189 lb 9.6 oz (86 kg)   SpO2 99%   BMI 25.71 kg/m    Review of Systems Denies n/v.      Objective:   Physical Exam VITAL SIGNS:  See vs page.  GENERAL: no distress.  Gait: steady with a cane.  PSYCH: Alert and oriented x 3.  Does not appear anxious nor depressed.    CT: NAD     Assessment & Plan:  Dizziness, new to me, uncertain etiology.  I told pt and son this has a long DDx HTN: worse off metoprolol, but we'll follow.  Patient Instructions  Please continue the same medications (please stay off the furosemide and metoprolol).   blood tests are requested for you today.  We'll let you know about the results.   Please come back for a follow-up appointment in 1 month.

## 2017-11-24 ENCOUNTER — Other Ambulatory Visit: Payer: Self-pay | Admitting: Endocrinology

## 2017-11-26 ENCOUNTER — Other Ambulatory Visit: Payer: Self-pay

## 2017-11-26 MED ORDER — "INSULIN SYRINGE-NEEDLE U-100 30G X 1/2"" 0.3 ML MISC": 1 refills | Status: DC

## 2017-11-26 MED ORDER — GLUCOSE BLOOD VI STRP: ORAL_STRIP | 4 refills | Status: DC

## 2017-12-09 ENCOUNTER — Other Ambulatory Visit: Payer: Self-pay

## 2017-12-09 MED ORDER — ONETOUCH DELICA LANCETS 33G MISC: 10 refills | Status: DC

## 2017-12-15 ENCOUNTER — Encounter: Payer: Self-pay | Admitting: Internal Medicine

## 2017-12-15 ENCOUNTER — Encounter (INDEPENDENT_AMBULATORY_CARE_PROVIDER_SITE_OTHER): Payer: Self-pay

## 2017-12-15 ENCOUNTER — Ambulatory Visit (INDEPENDENT_AMBULATORY_CARE_PROVIDER_SITE_OTHER): Payer: Medicare Other | Admitting: Internal Medicine

## 2017-12-15 VITALS — BP 154/60 | HR 89 | Ht 72.0 in | Wt 191.0 lb

## 2017-12-15 DIAGNOSIS — I10 Essential (primary) hypertension: Secondary | ICD-10-CM | POA: Diagnosis not present

## 2017-12-15 DIAGNOSIS — I2510 Atherosclerotic heart disease of native coronary artery without angina pectoris: Secondary | ICD-10-CM

## 2017-12-15 DIAGNOSIS — R260 Ataxic gait: Secondary | ICD-10-CM | POA: Diagnosis not present

## 2017-12-15 DIAGNOSIS — E782 Mixed hyperlipidemia: Secondary | ICD-10-CM | POA: Diagnosis not present

## 2017-12-15 NOTE — Progress Notes (Signed)
Cardiology Office Note   Date:  12/15/2017   ID:  Chad Mcmillan, DOB 1924-10-12, MRN [id number]  PCP:  [name], MD  Cardiologist:   Dorris Carnes, MD   F/U of CAD      History of Present Illness: Chad Mcmillan is a 84 y.o. male with a history of CAD  Last cath in 2000. (LAD 50%, 70%, Diag 90% LCX 70 to 80%. LVEF 70. He had a myoview in Fall 2010.    I saw him in September 2018   Since seen he denies CP  Breathing is stable His biggest problem is unsteadiness  Having a very hard time walking  Wavers Was seen in ED in Dec   Rx Vertigo  Medicine did not help Has fallen     LIves with wife     Outpatient Medications Prior to Visit  Medication Sig Dispense Refill  . acetaminophen (TYLENOL) 500 MG tablet Take 500-1,000 mg by mouth every 6 (six) hours as needed for headache (pain).    Marland Kitchen amLODipine (NORVASC) 2.5 MG tablet Take 1 tablet (2.5 mg total) by mouth daily. 30 tablet 5  . aspirin EC 81 MG tablet Take 1 tablet (81 mg total) by mouth daily. 90 tablet 3  . Blood Glucose Monitoring Suppl (ONE TOUCH ULTRA 2) W/DEVICE KIT Use to check blood sugar 2 times per day. Dx Code E11.22 1 each 2  . glucose blood (ONE TOUCH ULTRA TEST) test strip USE ONE STRIP TO CHECK GLUCOSE TWICE DAILY AS DIRECTED DX CODE E11.22. 200 each 4  . insulin lispro (HUMALOG) 100 UNIT/ML injection 3 times a day (just before each meal), 15-10-10 units (Patient taking differently: Inject 3-25 Units into the skin once. 3 times a day (just before each meal), 15-10-10 units) 20 mL 1  . insulin NPH Human (HUMULIN N) 100 UNIT/ML injection Inject 0.05 mLs (5 Units total) into the skin at bedtime. 10 mL 2  . Insulin Syringe-Needle U-100 (B-D INS SYR ULTRAFINE .3CC/30G) 30G X 1/2" 0.3 ML MISC USE AS DIRECTED 4 TIMES DAILY 300 each 1  . meclizine (ANTIVERT) 12.5 MG tablet Take 1 tablet (12.5 mg total) by mouth 3 (three) times daily as needed for dizziness. 30 tablet 3  . nitroGLYCERIN (NITROSTAT) 0.4 MG SL tablet Place 1  tablet (0.4 mg total) under the tongue every 5 (five) minutes as needed. For chest pain 25 tablet 3  . ONETOUCH DELICA LANCETS 19X MISC USE TO CHECK GLUCOSE TWICE DAILY AS DIRECTED 100 each 10  . OVER THE COUNTER MEDICATION Apply 1 drop topically as needed (dizziness).    . pravastatin (PRAVACHOL) 80 MG tablet TAKE 1 TABLET BY MOUTH ONCE DAILY 30 tablet 5  . hydrALAZINE (APRESOLINE) 10 MG tablet TAKE ONE TABLET BY MOUTH THREE TIMES DAILY (Patient not taking: Reported on 12/15/2017) 270 tablet 0   No facility-administered medications prior to visit.      Allergies:   Patient has no known allergies.   Past Medical History:  Diagnosis Date  . ADENOCARCINOMA, PROSTATE 06/01/2008  . CORONARY ARTERY DISEASE 06/13/2007  . CVA 06/01/2008   x 2  . DIABETES MELLITUS, TYPE I, WITH OPHTHALMIC COMPLICATIONS 5/88/3254  . 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 HYPERKALEMIA 06/01/2008   due to DM neparopathy  . HYPERLIPIDEMIA 06/13/2007  . HYPERTENSION 06/13/2007  . NEOPLASM, MALIGNANT, SKIN, FACE 06/01/2008  . Other chronic nonalcoholic liver disease 9/82/6415  . RENAL DISEASE 06/01/2008    Past Surgical History:  Procedure Laterality  Date  . Carotid Duplex  09/12/2004  . CATARACT EXTRACTION    . CHOLECYSTECTOMY    . ESOPHAGOGASTRODUODENOSCOPY  06/28/1991  . HERNIA REPAIR     right Inguinal hernia  . Rest/Stress Cardiolite  01/22/2001     Social History:  The patient  reports that he quit smoking about 39 years ago. he has never used smokeless tobacco. He reports that he does not drink alcohol or use drugs.   Family History:  The patient's family history is not on file.    ROS:  Please see the history of present illness. All other systems are reviewed and  Negative to the above problem except as noted.    PHYSICAL EXAM: VS:  BP (!) 154/60   Pulse 89   Ht 6' (1.829 m)   Wt 191 lb (86.6 kg)   SpO2 98%   BMI 25.90 kg/m   GEN: Well nourished, well developed, in no acute distress   Moving slow to table    HEENT: normal  Neck: JVp normal  , carotid bruits, or masses Cardiac: RRR; no murmurs, rubs, or gallops,no edema  Respiratory:  clear to auscultation bilaterally, normal work of breathing GI: soft, nontender, nondistended, + BS  No hepatomegaly  MS: no deformity Moving all extremities  Unsteady on standing  Skin: warm and dry, no rash Neuro:  Deferred    EKG:  EKG is not ordered today.   Lipid Panel    Component Value Date/Time   CHOL 142 04/07/2017 1355   TRIG 87.0 04/07/2017 1355   TRIG 51 09/29/2006 0801   HDL 38.30 (L) 04/07/2017 1355   CHOLHDL 4 04/07/2017 1355   VLDL 17.4 04/07/2017 1355   LDLCALC 86 04/07/2017 1355      Wt Readings from Last 3 Encounters:  12/15/17 191 lb (86.6 kg)  11/20/17 189 lb 9.6 oz (86 kg)  11/15/17 194 lb (88 kg)      ASSESSMENT AND PLAN:  1  CAD   I am not convinced of active symptoms   Follow  2  Dizziness.  I did not do extensive neuro eval  With standing the patients feet aare wide  Very unsteady   I would recomm referral to neurovestibular rehab.   If eval neg then neuro referral  Pt may need MRI   Had CT in ED that was negative  3  HL Keep on pravastatin  4  HTN  BP is a little high  I do not want to lower with unsteadiness, age     Signed, Dorris Carnes, MD  12/15/2017 12:21 PM    [facility] [address], [place], [place]  [address] Phone: [phone]; Fax: [phone]

## 2017-12-15 NOTE — Patient Instructions (Signed)
Your physician recommends that you continue on your current medications as directed. Please refer to the Current Medication list given to you today. You have been referred to [facility] They will call you to schedule an appointment. Please call our office if you do not hear from them.

## 2017-12-30 ENCOUNTER — Emergency Department (HOSPITAL_COMMUNITY)
Admission: EM | Admit: 2017-12-30 | Discharge: 2017-12-30 | Disposition: A | Discharge status: Home / Self Care | Payer: Medicare Other | Attending: Emergency Medicine | Admitting: Emergency Medicine

## 2017-12-30 ENCOUNTER — Emergency Department (HOSPITAL_COMMUNITY): Payer: Medicare Other

## 2017-12-30 ENCOUNTER — Encounter (HOSPITAL_COMMUNITY): Payer: Self-pay

## 2017-12-30 ENCOUNTER — Other Ambulatory Visit: Payer: Self-pay

## 2017-12-30 DIAGNOSIS — Z794 Long term (current) use of insulin: Secondary | ICD-10-CM | POA: Insufficient documentation

## 2017-12-30 DIAGNOSIS — R42 Dizziness and giddiness: Secondary | ICD-10-CM | POA: Insufficient documentation

## 2017-12-30 DIAGNOSIS — Z87891 Personal history of nicotine dependence: Secondary | ICD-10-CM | POA: Diagnosis not present

## 2017-12-30 DIAGNOSIS — Z85828 Personal history of other malignant neoplasm of skin: Secondary | ICD-10-CM | POA: Insufficient documentation

## 2017-12-30 DIAGNOSIS — Z7982 Long term (current) use of aspirin: Secondary | ICD-10-CM | POA: Diagnosis not present

## 2017-12-30 DIAGNOSIS — H532 Diplopia: Secondary | ICD-10-CM | POA: Insufficient documentation

## 2017-12-30 DIAGNOSIS — N189 Chronic kidney disease, unspecified: Secondary | ICD-10-CM | POA: Diagnosis not present

## 2017-12-30 DIAGNOSIS — Z8673 Personal history of transient ischemic attack (TIA), and cerebral infarction without residual deficits: Secondary | ICD-10-CM | POA: Diagnosis not present

## 2017-12-30 DIAGNOSIS — R269 Unspecified abnormalities of gait and mobility: Secondary | ICD-10-CM | POA: Insufficient documentation

## 2017-12-30 DIAGNOSIS — J181 Lobar pneumonia, unspecified organism: Secondary | ICD-10-CM | POA: Insufficient documentation

## 2017-12-30 DIAGNOSIS — E103299 Type 1 diabetes mellitus with mild nonproliferative diabetic retinopathy without macular edema, unspecified eye: Secondary | ICD-10-CM | POA: Diagnosis not present

## 2017-12-30 DIAGNOSIS — I129 Hypertensive chronic kidney disease with stage 1 through stage 4 chronic kidney disease, or unspecified chronic kidney disease: Secondary | ICD-10-CM | POA: Diagnosis not present

## 2017-12-30 DIAGNOSIS — I2510 Atherosclerotic heart disease of native coronary artery without angina pectoris: Secondary | ICD-10-CM | POA: Diagnosis not present

## 2017-12-30 DIAGNOSIS — E1022 Type 1 diabetes mellitus with diabetic chronic kidney disease: Secondary | ICD-10-CM | POA: Diagnosis not present

## 2017-12-30 DIAGNOSIS — J189 Pneumonia, unspecified organism: Secondary | ICD-10-CM

## 2017-12-30 LAB — BASIC METABOLIC PANEL
ANION GAP: 12 (ref 5–15)
BUN: 32 mg/dL — ABNORMAL HIGH (ref 6–20)
CALCIUM: 9.2 mg/dL (ref 8.9–10.3)
CHLORIDE: 106 mmol/L (ref 101–111)
CO2: 22 mmol/L (ref 22–32)
Creatinine, Ser: 2 mg/dL — ABNORMAL HIGH (ref 0.61–1.24)
GFR calc non Af Amer: 27 mL/min — ABNORMAL LOW (ref >60)
GFR, EST AFRICAN AMERICAN: 32 mL/min — AB (ref >60)
Glucose, Bld: 112 mg/dL — ABNORMAL HIGH (ref 65–99)
Potassium: 5.3 mmol/L — ABNORMAL HIGH (ref 3.5–5.1)
Sodium: 140 mmol/L (ref 135–145)

## 2017-12-30 LAB — CBC WITH DIFFERENTIAL/PLATELET
BASOS PCT: 0 %
Basophils Absolute: 0 10*3/uL (ref 0.0–0.1)
Eosinophils Absolute: 0 10*3/uL (ref 0.0–0.7)
Eosinophils Relative: 1 %
HEMATOCRIT: 34.6 % — AB (ref 39.0–52.0)
HEMOGLOBIN: 11.3 g/dL — AB (ref 13.0–17.0)
Lymphocytes Relative: 26 %
Lymphs Abs: 1.4 10*3/uL (ref 0.7–4.0)
MCH: 31.7 pg (ref 26.0–34.0)
MCHC: 32.7 g/dL (ref 30.0–36.0)
MCV: 96.9 fL (ref 78.0–100.0)
MONOS PCT: 10 %
Monocytes Absolute: 0.5 10*3/uL (ref 0.1–1.0)
NEUTROS ABS: 3.3 10*3/uL (ref 1.7–7.7)
NEUTROS PCT: 63 %
Platelets: 105 10*3/uL — ABNORMAL LOW (ref 150–400)
RBC: 3.57 MIL/uL — ABNORMAL LOW (ref 4.22–5.81)
RDW: 13.4 % (ref 11.5–15.5)
WBC: 5.1 10*3/uL (ref 4.0–10.5)

## 2017-12-30 LAB — CBG MONITORING, ED: Glucose-Capillary: 116 mg/dL — ABNORMAL HIGH (ref 65–99)

## 2017-12-30 MED ORDER — CEFDINIR 300 MG PO CAPS: 300.0000 mg | ORAL_CAPSULE | Freq: Two times a day (BID) | ORAL | 0 refills | Status: AC

## 2017-12-30 MED ORDER — DOXYCYCLINE HYCLATE 100 MG PO TABS
100.0000 mg | ORAL_TABLET | Freq: Once | ORAL | Status: AC
  Administered 2017-12-30: 100 mg via ORAL
  Filled 2017-12-30: qty 1

## 2017-12-30 MED ORDER — CEFPODOXIME PROXETIL 200 MG PO TABS
200.0000 mg | ORAL_TABLET | Freq: Two times a day (BID) | ORAL | Status: DC
  Administered 2017-12-30: 200 mg via ORAL
  Filled 2017-12-30: qty 1

## 2017-12-30 MED ORDER — DOXYCYCLINE HYCLATE 100 MG PO CAPS: 100.0000 mg | ORAL_CAPSULE | Freq: Two times a day (BID) | ORAL | 0 refills | Status: AC

## 2017-12-30 NOTE — Discharge Instructions (Addendum)
Your imaging today showed no evidence of new stroke but it does look like he has the old strokes.  We suspect he may have been slightly dehydrated contributing to symptoms.  We also found evidence of pneumonia that may have contributed to symptoms.  Please take the antibiotics to treat the pneumonia.  We would like you to be evaluated at home by the physical therapy teams to determine your further needs.  Please be careful not to fall.  Please follow-up with your primary doctor as well and if any symptoms change or worsen, please return to the nearest emergency department.

## 2017-12-30 NOTE — ED Notes (Addendum)
Pt's CBG 116.  Informed Chelsea, RN.

## 2017-12-30 NOTE — ED Provider Notes (Signed)
4:10 PM Care assumed from Dr. Alvino Chapel.  At time of transfer of care, patient is awaiting MRI to rule out acute stroke in the setting of his recurrent vertigo.  According to previous team, patient has a history of cerebellar infarct on prior MRI.  Anticipate reassessment after MRI.  If stroke is present, will consult neurology.  If it is negative, patient will likely be safe for discharge home.  MRI showed no acute stroke.  Suspect patient's symptoms of dizziness and gait problem are partially due to prior stroke but may have been worsened by occult infection.  Patient did report some chest pain from his fall, will obtain chest x-ray to look for rib fractures as well as occult pneumonia given patient's history of pneumonia.  X-ray showed evidence of pneumonia.  Patient was offered admission for IV antibiotics and PT/OT eval however, after speaking with care coordination and case management, patient will have PT/OT, home health, and nursing come out to the home to evaluate for level of care needs and ambulation assessment.  Patient would rather be discharged home with antibiotics for the minimally symptomatic pneumonia and stay hydrated without admission.  This was felt acceptable and family understood risks of recurrent falls, worsen pneumonia, or worsening infection.  They agree with plan of outpatient management as well as strict return precautions.  Patient had no other questions or concerns and was discharged in good condition.   Clinical Impression: 1. Dizziness   2. Gait abnormality   3. Community acquired pneumonia of right lower lobe of lung ([place])     Disposition: Discharge  Condition: Good  I have discussed the results, Dx and Tx plan with the pt(& family if present). He/she/they expressed understanding and agree(s) with the plan. Discharge instructions discussed at great length. Strict return precautions discussed and pt &/or family have verbalized understanding of the  instructions. No further questions at time of discharge.    Discharge Medication List as of 12/30/2017 10:16 PM    START taking these medications   Details  cefdinir (OMNICEF) 300 MG capsule Take 1 capsule (300 mg total) by mouth 2 (two) times daily for 7 days., Starting Tue 12/30/2017, Until Tue 01/06/2018, Print    doxycycline (VIBRAMYCIN) 100 MG capsule Take 1 capsule (100 mg total) by mouth 2 (two) times daily for 7 days., Starting Tue 12/30/2017, Until Tue 01/06/2018, Print        Follow Up: [name], MD [address] [place] [address] [phone]  Follow up   [place] [address] [id number] [place] [phone] Follow up   [facility], [place] [address] [phone]  Follow up home health physical therapy, occupational therapy, RN and home health aide       Faydra Korman, Gwenyth Allegra, MD 12/31/17 [phone]

## 2017-12-30 NOTE — ED Notes (Signed)
Pt back from MRI 

## 2017-12-30 NOTE — ED Notes (Signed)
ED Provider at bedside. 

## 2017-12-30 NOTE — ED Triage Notes (Signed)
Pt arrives via EMS from home with complaints of dizziness and room spinning x 6 months. Denies n/v, cp, sob.   193/99 Hr 98 spo2 100 rr 20   Pt son reports he has appointment for therapy for vertigo.

## 2017-12-30 NOTE — Care Management Note (Signed)
Case Management Note  Patient Details  Name: Chad Mcmillan MRN: [id number] Date of Birth: 05-Jul-1924  Subjective/Objective:                  Vertigo  Action/Plan: [facility] spoke with the patient and his son at the bedside. Patient and his son selected [facility] for home health services. Patient needs a rolling walker with a seat. He lives in a home with his wife. States he shares a walker with her. He states when his wife needs to use the bathroom badly he has to wait to let her use the walker before he can use it. States his wife is not able to bathe herself, he helps her but states he has difficulty bathing himself. Orders for home health and DME faxed to [facility].   Expected Discharge Date:   12/30/17              Expected Discharge Plan:  [place]  In-House Referral:     Discharge planning Services  CM Consult  Post Acute Care Choice:  Home Health, Durable Medical Equipment Choice offered to:  Patient, Adult Children  DME Arranged:  Walker rolling with seat DME Agency:  [place]:  RN, OT, PT, Nurse's Aide [place] Agency:  [place]  Status of Service:  Completed, signed off  If discussed at [place] of Stay Meetings, dates discussed:    Additional Comments:  Apolonio Schneiders, RN 12/30/2017, 8:15 PM

## 2017-12-30 NOTE — ED Notes (Signed)
Pt complaining of left side rib pain and requesting xray. Dr. Sherry Ruffing made aware

## 2017-12-30 NOTE — ED Provider Notes (Addendum)
[place] EMERGENCY DEPARTMENT Provider Note   CSN: [id number] Arrival date & time: 12/30/17  1235     History   Chief Complaint Chief Complaint  Patient presents with  . Dizziness    HPI Chad Mcmillan is a 84 y.o. male.  HPI Patient presents with vertigo.  Has had on and off for the last few years.  Has been worked up in the past.  However has been worse recently.  States he is actually having difficulty walking.  States whenever he bends forward or stands up he feels unsteady and is having difficulty walking.  States he has to grab things so he does not fall.  Has been seen for this previously.  Reviewed recent cardiology note for the same.  No chest pain.  No nausea vomiting or diarrhea.  No injury from fall.  Patient states he will sometimes get double vision. Past Medical History:  Diagnosis Date  . ADENOCARCINOMA, PROSTATE 06/01/2008  . CORONARY ARTERY DISEASE 06/13/2007  . CVA 06/01/2008   x 2  . DIABETES MELLITUS, TYPE I, WITH OPHTHALMIC COMPLICATIONS 0/86/5784  . 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 HYPERKALEMIA 06/01/2008   due to DM neparopathy  . HYPERLIPIDEMIA 06/13/2007  . HYPERTENSION 06/13/2007  . NEOPLASM, MALIGNANT, SKIN, FACE 06/01/2008  . Other chronic nonalcoholic liver disease 6/96/2952  . RENAL DISEASE 06/01/2008    Patient Active Problem List   Diagnosis Date Noted  . Foot pain, bilateral 10/07/2017  . Memory loss 04/07/2017  . Numbness 03/06/2016  . Dizzy 01/31/2016  . Medication side effect 03/08/2015  . Pancytopenia ([place]) 07/27/2012  . Screening for prostate cancer 07/27/2012  . DM (diabetes mellitus) ([place]) 07/27/2012  . Anemia 07/27/2012  . Encounter for long-term (current) use of other medications 07/23/2011  . NASH (nonalcoholic steatohepatitis) 07/17/2010  . COUGH 02/06/2010  . PNEUMONIA, ORGANISM UNSPECIFIED 11/27/2009  . NEOPLASM, MALIGNANT, SKIN, FACE 06/01/2008  . ADENOCARCINOMA, PROSTATE 06/01/2008  . HYPERKALEMIA 06/01/2008  .  CVA 06/01/2008  . Disorder of kidney and ureter 06/01/2008  . NEOPLASM, MALIGNANT, SKIN, FACE 06/01/2008  . Dyslipidemia 06/13/2007  . Essential hypertension 06/13/2007  . Coronary atherosclerosis 06/13/2007    Past Surgical History:  Procedure Laterality Date  . Carotid Duplex  09/12/2004  . CATARACT EXTRACTION    . CHOLECYSTECTOMY    . ESOPHAGOGASTRODUODENOSCOPY  06/28/1991  . HERNIA REPAIR     right Inguinal hernia  . Rest/Stress Cardiolite  01/22/2001       Home Medications    Prior to Admission medications   Medication Sig Start Date End Date Taking? Authorizing Provider  acetaminophen (TYLENOL) 500 MG tablet Take 500-1,000 mg by mouth every 6 (six) hours as needed for headache (pain).    [provider]  amLODipine (NORVASC) 2.5 MG tablet Take 1 tablet (2.5 mg total) by mouth daily. 07/18/17   [name], MD  aspirin EC 81 MG tablet Take 1 tablet (81 mg total) by mouth daily. 08/04/17   [name], MD  Blood Glucose Monitoring Suppl (ONE TOUCH ULTRA 2) W/DEVICE KIT Use to check blood sugar 2 times per day. Dx Code E11.22 06/09/15   [name], MD  glucose blood (ONE TOUCH ULTRA TEST) test strip USE ONE STRIP TO CHECK GLUCOSE TWICE DAILY AS DIRECTED DX CODE E11.22. 11/26/17   [name], MD  insulin lispro (HUMALOG) 100 UNIT/ML injection 3 times a day (just before each meal), 15-10-10 units Patient taking differently: Inject 3-25 Units into the skin once.  3 times a day (just before each meal), 15-10-10 units 10/07/17   [name], MD  insulin NPH Human (HUMULIN N) 100 UNIT/ML injection Inject 0.05 mLs (5 Units total) into the skin at bedtime. 07/07/17   [name], MD  Insulin Syringe-Needle U-100 (B-D INS SYR ULTRAFINE .3CC/30G) 30G X 1/2" 0.3 ML MISC USE AS DIRECTED 4 TIMES DAILY 11/26/17   [name], MD  meclizine (ANTIVERT) 12.5 MG tablet Take 1 tablet (12.5 mg total) by mouth 3 (three) times daily as needed for dizziness. 11/20/17   [name], MD    nitroGLYCERIN (NITROSTAT) 0.4 MG SL tablet Place 1 tablet (0.4 mg total) under the tongue every 5 (five) minutes as needed. For chest pain 08/04/17   [name], MD  [facility] DELICA LANCETS 42A MISC USE TO CHECK GLUCOSE TWICE DAILY AS DIRECTED 12/09/17   [name], MD  OVER THE COUNTER MEDICATION Apply 1 drop topically as needed (dizziness).    [provider]  pravastatin (PRAVACHOL) 80 MG tablet TAKE 1 TABLET BY MOUTH ONCE DAILY 11/24/17   [name], MD    Family History No family history on file.  Social History Social History   Tobacco Use  . Smoking status: Former Smoker    Last attempt to quit: 11/18/1978    Years since quitting: 39.1  . Smokeless tobacco: Never Used  Substance Use Topics  . Alcohol use: No  . Drug use: No     Allergies   Patient has no known allergies.   Review of Systems Review of Systems  Constitutional: Negative for appetite change.  HENT: Negative for congestion.   Eyes: Positive for visual disturbance.  Respiratory: Negative for shortness of breath.   Cardiovascular: Negative for chest pain.  Gastrointestinal: Negative for abdominal pain.  Endocrine: Negative for polyuria.  Genitourinary: Negative for difficulty urinating.  Musculoskeletal: Negative for back pain.  Neurological: Positive for dizziness and light-headedness. Negative for syncope, speech difficulty and weakness.  Hematological: Negative for adenopathy.  Psychiatric/Behavioral: Negative for confusion.     Physical Exam Updated Vital Signs BP (!) 174/101   Pulse (!) 107   Temp (!) 97.5 F (36.4 C) (Oral)   Resp (!) 29   SpO2 100%   Physical Exam  Constitutional: He is oriented to person, place, and time. He appears well-developed and well-nourished.  HENT:  Head: Atraumatic.  Eyes: EOM are normal.  Neck: Neck supple.  Cardiovascular: Normal rate.  Pulmonary/Chest: Effort normal.  Abdominal: There is no tenderness.  Musculoskeletal: He exhibits no  edema.  Neurological: He is alert and oriented to person, place, and time.  Patient with bilateral tremor.  Finger-nose may be slightly off on the right but somewhat difficult to examine due to the tremor.  Good grip strength bilaterally.  States he feels lightheaded with tipping his head forward.  No diplopia.  Conjugate eye movements.  However is unsteady with standing.  Skin: Skin is warm.     ED Treatments / Results  Labs (all labs ordered are listed, but only abnormal results are displayed) Labs Reviewed  BASIC METABOLIC PANEL - Abnormal; Notable for the following components:      Result Value   Potassium 5.3 (*)    Glucose, Bld 112 (*)    BUN 32 (*)    Creatinine, Ser 2.00 (*)    GFR calc non Af Amer 27 (*)    GFR calc Af Amer 32 (*)    All other components within normal limits  CBC  WITH DIFFERENTIAL/PLATELET - Abnormal; Notable for the following components:   RBC 3.57 (*)    Hemoglobin 11.3 (*)    HCT 34.6 (*)    Platelets 105 (*)    All other components within normal limits  CBG MONITORING, ED - Abnormal; Notable for the following components:   Glucose-Capillary 116 (*)    All other components within normal limits    EKG  EKG Interpretation  Date/Time:  Tuesday December 30 2017 12:44:27 EST Ventricular Rate:  94 PR Interval:    QRS Duration: 147 QT Interval:  396 QTC Calculation: 496 R Axis:   -46 Text Interpretation:  with 1st degree A-V block Left bundle branch block No significant change since last tracing Confirmed by Davonna Belling [phone]) on 12/30/2017 4:02:45 PM       Radiology No results found.  Procedures Procedures (including critical care time)  Medications Ordered in ED Medications - No data to display   Initial Impression / Assessment and Plan / ED Course  I have reviewed the triage vital signs and the nursing notes.  Pertinent labs & imaging results that were available during my care of the patient were reviewed by me and considered  in my medical decision making (see chart for details).     Patient with vertigo/unsteadiness.  Has had similar symptoms in the past.  However states getting worse.  Will get MRI to evaluate for stroke.  Has had previous cerebellar strokes.  Not a TPA candidate due to time of onset with last normal being months ago.  Will get noncontrast CTA of neck and due to renal insufficiency.  Mild hyperkalemia is chronic.  Care turned over to Dr. Sherry Ruffing  Final Clinical Impressions(s) / ED Diagnoses   Final diagnoses:  None    ED Discharge Orders    None       Davonna Belling, MD 12/30/17 [place]    Davonna Belling, MD 12/30/17 [phone]

## 2018-01-05 ENCOUNTER — Telehealth: Payer: Self-pay | Admitting: Internal Medicine

## 2018-01-05 NOTE — Telephone Encounter (Signed)
Disregard

## 2018-01-07 ENCOUNTER — Inpatient Hospital Stay (HOSPITAL_COMMUNITY)
Admission: EM | Admit: 2018-01-07 | Discharge: 2018-01-09 | DRG: 071 | Disposition: A | Discharge status: Home Health | Payer: Medicare Other | Attending: Internal Medicine | Admitting: Internal Medicine

## 2018-01-07 ENCOUNTER — Other Ambulatory Visit: Payer: Self-pay

## 2018-01-07 ENCOUNTER — Telehealth: Payer: Self-pay | Admitting: Endocrinology

## 2018-01-07 ENCOUNTER — Inpatient Hospital Stay (HOSPITAL_COMMUNITY): Payer: Medicare Other

## 2018-01-07 ENCOUNTER — Ambulatory Visit (INDEPENDENT_AMBULATORY_CARE_PROVIDER_SITE_OTHER): Payer: Medicare Other | Admitting: Endocrinology

## 2018-01-07 ENCOUNTER — Encounter (HOSPITAL_COMMUNITY): Payer: Self-pay | Admitting: Emergency Medicine

## 2018-01-07 ENCOUNTER — Emergency Department (HOSPITAL_COMMUNITY): Payer: Medicare Other

## 2018-01-07 VITALS — BP 166/92 | HR 144 | Temp 98.1°F | Ht 72.0 in

## 2018-01-07 DIAGNOSIS — D631 Anemia in chronic kidney disease: Secondary | ICD-10-CM | POA: Diagnosis present

## 2018-01-07 DIAGNOSIS — R531 Weakness: Secondary | ICD-10-CM | POA: Diagnosis not present

## 2018-01-07 DIAGNOSIS — R42 Dizziness and giddiness: Secondary | ICD-10-CM

## 2018-01-07 DIAGNOSIS — N183 Chronic kidney disease, stage 3 unspecified: Secondary | ICD-10-CM

## 2018-01-07 DIAGNOSIS — Z794 Long term (current) use of insulin: Secondary | ICD-10-CM

## 2018-01-07 DIAGNOSIS — E785 Hyperlipidemia, unspecified: Secondary | ICD-10-CM | POA: Diagnosis not present

## 2018-01-07 DIAGNOSIS — Z8546 Personal history of malignant neoplasm of prostate: Secondary | ICD-10-CM

## 2018-01-07 DIAGNOSIS — Z6823 Body mass index (BMI) 23.0-23.9, adult: Secondary | ICD-10-CM

## 2018-01-07 DIAGNOSIS — R627 Adult failure to thrive: Secondary | ICD-10-CM | POA: Diagnosis present

## 2018-01-07 DIAGNOSIS — Z8673 Personal history of transient ischemic attack (TIA), and cerebral infarction without residual deficits: Secondary | ICD-10-CM | POA: Diagnosis not present

## 2018-01-07 DIAGNOSIS — E860 Dehydration: Secondary | ICD-10-CM | POA: Diagnosis present

## 2018-01-07 DIAGNOSIS — R05 Cough: Secondary | ICD-10-CM

## 2018-01-07 DIAGNOSIS — N179 Acute kidney failure, unspecified: Secondary | ICD-10-CM | POA: Diagnosis present

## 2018-01-07 DIAGNOSIS — G9341 Metabolic encephalopathy: Secondary | ICD-10-CM | POA: Diagnosis not present

## 2018-01-07 DIAGNOSIS — F0390 Unspecified dementia without behavioral disturbance: Secondary | ICD-10-CM | POA: Diagnosis present

## 2018-01-07 DIAGNOSIS — Z9049 Acquired absence of other specified parts of digestive tract: Secondary | ICD-10-CM | POA: Diagnosis not present

## 2018-01-07 DIAGNOSIS — E1021 Type 1 diabetes mellitus with diabetic nephropathy: Secondary | ICD-10-CM | POA: Diagnosis present

## 2018-01-07 DIAGNOSIS — I639 Cerebral infarction, unspecified: Secondary | ICD-10-CM

## 2018-01-07 DIAGNOSIS — I2510 Atherosclerotic heart disease of native coronary artery without angina pectoris: Secondary | ICD-10-CM | POA: Diagnosis present

## 2018-01-07 DIAGNOSIS — E1122 Type 2 diabetes mellitus with diabetic chronic kidney disease: Secondary | ICD-10-CM | POA: Diagnosis not present

## 2018-01-07 DIAGNOSIS — Z9849 Cataract extraction status, unspecified eye: Secondary | ICD-10-CM | POA: Diagnosis not present

## 2018-01-07 DIAGNOSIS — I129 Hypertensive chronic kidney disease with stage 1 through stage 4 chronic kidney disease, or unspecified chronic kidney disease: Secondary | ICD-10-CM | POA: Diagnosis present

## 2018-01-07 DIAGNOSIS — E1022 Type 1 diabetes mellitus with diabetic chronic kidney disease: Secondary | ICD-10-CM | POA: Diagnosis present

## 2018-01-07 DIAGNOSIS — I447 Left bundle-branch block, unspecified: Secondary | ICD-10-CM | POA: Diagnosis present

## 2018-01-07 DIAGNOSIS — Z85828 Personal history of other malignant neoplasm of skin: Secondary | ICD-10-CM

## 2018-01-07 DIAGNOSIS — Z7982 Long term (current) use of aspirin: Secondary | ICD-10-CM | POA: Diagnosis not present

## 2018-01-07 DIAGNOSIS — E10319 Type 1 diabetes mellitus with unspecified diabetic retinopathy without macular edema: Secondary | ICD-10-CM | POA: Diagnosis present

## 2018-01-07 DIAGNOSIS — R059 Cough, unspecified: Secondary | ICD-10-CM

## 2018-01-07 DIAGNOSIS — Z87891 Personal history of nicotine dependence: Secondary | ICD-10-CM

## 2018-01-07 LAB — BASIC METABOLIC PANEL
ANION GAP: 14 (ref 5–15)
BUN: 58 mg/dL — AB (ref 6–20)
CO2: 19 mmol/L — ABNORMAL LOW (ref 22–32)
Calcium: 9.1 mg/dL (ref 8.9–10.3)
Chloride: 109 mmol/L (ref 101–111)
Creatinine, Ser: 2.68 mg/dL — ABNORMAL HIGH (ref 0.61–1.24)
GFR calc Af Amer: 22 mL/min — ABNORMAL LOW (ref >60)
GFR, EST NON AFRICAN AMERICAN: 19 mL/min — AB (ref >60)
GLUCOSE: 150 mg/dL — AB (ref 65–99)
POTASSIUM: 4.5 mmol/L (ref 3.5–5.1)
Sodium: 142 mmol/L (ref 135–145)

## 2018-01-07 LAB — URINALYSIS, ROUTINE W REFLEX MICROSCOPIC
BACTERIA UA: NONE SEEN
BILIRUBIN URINE: NEGATIVE
Glucose, UA: NEGATIVE mg/dL
KETONES UR: NEGATIVE mg/dL
LEUKOCYTES UA: NEGATIVE
NITRITE: NEGATIVE
Protein, ur: 30 mg/dL — AB
Specific Gravity, Urine: 1.006 (ref 1.005–1.030)
Squamous Epithelial / LPF: NONE SEEN
pH: 6 (ref 5.0–8.0)

## 2018-01-07 LAB — CBC
HEMATOCRIT: 35.1 % — AB (ref 39.0–52.0)
Hemoglobin: 11.6 g/dL — ABNORMAL LOW (ref 13.0–17.0)
MCH: 31.6 pg (ref 26.0–34.0)
MCHC: 33 g/dL (ref 30.0–36.0)
MCV: 95.6 fL (ref 78.0–100.0)
PLATELETS: 131 10*3/uL — AB (ref 150–400)
RBC: 3.67 MIL/uL — AB (ref 4.22–5.81)
RDW: 13.3 % (ref 11.5–15.5)
WBC: 6.4 10*3/uL (ref 4.0–10.5)

## 2018-01-07 LAB — I-STAT TROPONIN, ED: Troponin i, poc: 0.07 ng/mL (ref 0.00–0.08)

## 2018-01-07 LAB — POCT GLYCOSYLATED HEMOGLOBIN (HGB A1C): Hemoglobin A1C: 6.4

## 2018-01-07 MED ORDER — LORAZEPAM 2 MG/ML IJ SOLN: 0.5000 mg | Freq: Three times a day (TID) | INTRAMUSCULAR | Status: DC | PRN

## 2018-01-07 MED ORDER — MECLIZINE HCL 12.5 MG PO TABS
12.5000 mg | ORAL_TABLET | Freq: Three times a day (TID) | ORAL | Status: DC
  Administered 2018-01-08 (×3): 12.5 mg via ORAL
  Filled 2018-01-07 (×3): qty 1

## 2018-01-07 MED ORDER — SODIUM CHLORIDE 0.9 % IV SOLN
INTRAVENOUS | Status: DC
  Administered 2018-01-08: 17:00:00 via INTRAVENOUS
  Administered 2018-01-08: 1000 mL via INTRAVENOUS

## 2018-01-07 MED ORDER — ONDANSETRON HCL 4 MG/2ML IJ SOLN: 4.0000 mg | Freq: Four times a day (QID) | INTRAMUSCULAR | Status: DC | PRN

## 2018-01-07 MED ORDER — INSULIN NPH (HUMAN) (ISOPHANE) 100 UNIT/ML ~~LOC~~ SUSP
3.0000 [IU] | Freq: Every day | SUBCUTANEOUS | Status: DC
  Administered 2018-01-08 (×2): 3 [IU] via SUBCUTANEOUS
  Filled 2018-01-07: qty 10

## 2018-01-07 MED ORDER — INSULIN ASPART 100 UNIT/ML ~~LOC~~ SOLN
0.0000 [IU] | Freq: Three times a day (TID) | SUBCUTANEOUS | Status: DC
  Administered 2018-01-08: 3 [IU] via SUBCUTANEOUS
  Administered 2018-01-08 – 2018-01-09 (×3): 2 [IU] via SUBCUTANEOUS

## 2018-01-07 MED ORDER — ACETAMINOPHEN 650 MG RE SUPP: 650.0000 mg | Freq: Four times a day (QID) | RECTAL | Status: DC | PRN

## 2018-01-07 MED ORDER — SODIUM CHLORIDE 0.9 % IV SOLN
INTRAVENOUS | Status: DC
  Administered 2018-01-07: 16:00:00 via INTRAVENOUS

## 2018-01-07 MED ORDER — ONDANSETRON HCL 4 MG PO TABS: 4.0000 mg | ORAL_TABLET | Freq: Four times a day (QID) | ORAL | Status: DC | PRN

## 2018-01-07 MED ORDER — NITROGLYCERIN 0.4 MG SL SUBL: 0.4000 mg | SUBLINGUAL_TABLET | SUBLINGUAL | Status: DC | PRN

## 2018-01-07 MED ORDER — ACETAMINOPHEN 325 MG PO TABS: 650.0000 mg | ORAL_TABLET | Freq: Four times a day (QID) | ORAL | Status: DC | PRN

## 2018-01-07 MED ORDER — ENOXAPARIN SODIUM 40 MG/0.4ML ~~LOC~~ SOLN
40.0000 mg | Freq: Every day | SUBCUTANEOUS | Status: DC
  Administered 2018-01-08 – 2018-01-09 (×2): 40 mg via SUBCUTANEOUS
  Filled 2018-01-07 (×2): qty 0.4

## 2018-01-07 MED ORDER — PRAVASTATIN SODIUM 40 MG PO TABS
80.0000 mg | ORAL_TABLET | Freq: Every day | ORAL | Status: DC
  Administered 2018-01-08 – 2018-01-09 (×2): 80 mg via ORAL
  Filled 2018-01-07 (×2): qty 2

## 2018-01-07 MED ORDER — ZOLPIDEM TARTRATE 5 MG PO TABS: 5.0000 mg | ORAL_TABLET | Freq: Every evening | ORAL | Status: DC | PRN

## 2018-01-07 MED ORDER — SODIUM CHLORIDE 0.9 % IV BOLUS (SEPSIS)
1000.0000 mL | Freq: Once | INTRAVENOUS | Status: AC
  Administered 2018-01-07: 1000 mL via INTRAVENOUS

## 2018-01-07 MED ORDER — HYDROXYZINE HCL 10 MG PO TABS
10.0000 mg | ORAL_TABLET | Freq: Three times a day (TID) | ORAL | Status: DC | PRN
  Filled 2018-01-07: qty 1

## 2018-01-07 MED ORDER — ASPIRIN EC 81 MG PO TBEC
81.0000 mg | DELAYED_RELEASE_TABLET | Freq: Every day | ORAL | Status: DC
  Administered 2018-01-08 – 2018-01-09 (×2): 81 mg via ORAL
  Filled 2018-01-07 (×2): qty 1

## 2018-01-07 MED ORDER — AMLODIPINE BESYLATE 5 MG PO TABS
5.0000 mg | ORAL_TABLET | Freq: Every day | ORAL | Status: DC
  Administered 2018-01-08 – 2018-01-09 (×3): 5 mg via ORAL
  Filled 2018-01-07 (×3): qty 1

## 2018-01-07 MED ORDER — HYDRALAZINE HCL 20 MG/ML IJ SOLN: 5.0000 mg | INTRAMUSCULAR | Status: DC | PRN

## 2018-01-07 NOTE — H&P (Signed)
History and Physical    Chad Mcmillan QIH:[id number] DOB: [birth year] DOA: 01/07/2018  Referring MD/NP/PA:   PCP: [name], MD   Patient coming from:  The patient is coming from home.  At baseline, pt is partially dependent for most of ADL.   Chief Complaint: Confusion, agitation, generalized weakness.  HPI: Chad Mcmillan is a 84 y.o. male with medical history significant of hypertension, hyperlipidemia, diabetes mellitus, stroke, CKD-3, CAD, prostate cancer, vertigo, Karlene Lineman, left bundle blockage, who presents with confusion, agitation, generalized weakness.  Per pt's son, pt mental status has been declining since last June, much worse in the past 2-3 months. Pt is more confused in the past several days. He is easily agitated and gets angry with others. He has generalized weakness and poor balance. He fell many times without significant injury. Patient does not seem to have chest pain, cough, shortness breath, fever or chills. He has nausea, but did not reports abdominal pain, diarrhea, symptoms of UTI. Sometimes his talk is not clear, but no slurred speech today. No facial droop, vision change or hearing loss. Of note pt recently had MRI of brain and MRA of head and neck which was not impressive.  ED Course: pt was found to have WBC 6.4, ammonia 22, negative troponin, negative urinalysis, worsening renal function, temperature normal, oxygen saturation 97% on room air. Chest x-ray showed improved aeration in the right middle lobe. Pt is admitted to telemetry bed as inpatient.  Review of Systems:   General: no fevers, chills, no body weight gain, has fatigue HEENT: no blurry vision, hearing changes or sore throat Respiratory: no dyspnea, coughing, wheezing CV: no chest pain, no palpitations GI: has nausea, no vomiting, abdominal pain, diarrhea, constipation GU: no dysuria, burning on urination, increased urinary frequency, hematuria  Ext: no leg edema Neuro: no unilateral weakness, numbness,  or tingling, no vision change or hearing loss. Has confusion and agitation. Skin: no rash, no skin tear. MSK: No muscle spasm, no deformity, no limitation of range of movement in spin Heme: No easy bruising.  Travel history: No recent long distant travel.  Allergy: No Known Allergies  Past Medical History:  Diagnosis Date  . ADENOCARCINOMA, PROSTATE 06/01/2008  . CORONARY ARTERY DISEASE 06/13/2007  . CVA 06/01/2008   x 2  . DIABETES MELLITUS, TYPE I, WITH OPHTHALMIC COMPLICATIONS 8/75/6433  . 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 HYPERKALEMIA 06/01/2008   due to DM neparopathy  . HYPERLIPIDEMIA 06/13/2007  . HYPERTENSION 06/13/2007  . NEOPLASM, MALIGNANT, SKIN, FACE 06/01/2008  . Other chronic nonalcoholic liver disease 2/95/1884  . RENAL DISEASE 06/01/2008    Past Surgical History:  Procedure Laterality Date  . Carotid Duplex  09/12/2004  . CATARACT EXTRACTION    . CHOLECYSTECTOMY    . ESOPHAGOGASTRODUODENOSCOPY  06/28/1991  . HERNIA REPAIR     right Inguinal hernia  . Rest/Stress Cardiolite  01/22/2001    Social History:  reports that he quit smoking about 39 years ago. he has never used smokeless tobacco. He reports that he does not drink alcohol or use drugs.  Family History: Could not be reviewed since patient is agitated and confused.    Prior to Admission medications   Medication Sig Start Date End Date Taking? Authorizing Provider  Acetaminophen 500 MG coapsule Take 500-1,000 mg by mouth every 6 (six) hours as needed for headache (pain).   Yes [provider]  aspirin EC 81 MG tablet Take 1 tablet (81 mg total) by mouth daily. 08/04/17  Yes [name], MD  insulin lispro (HUMALOG) 100 UNIT/ML injection 3 times a day (just before each meal), 15-10-10 units Patient taking differently: Inject 3-25 Units into the skin once. 3 times a day (just before each meal), 15-10-10 units 10/07/17  Yes [name], MD  insulin NPH Human (HUMULIN N) 100 UNIT/ML injection Inject 0.05 mLs  (5 Units total) into the skin at bedtime. 07/07/17  Yes [name], MD  meclizine (ANTIVERT) 12.5 MG tablet Take 1 tablet (12.5 mg total) by mouth 3 (three) times daily as needed for dizziness. Patient taking differently: Take 12.5 mg by mouth 3 (three) times daily.  11/20/17  Yes [name], MD  nitroGLYCERIN (NITROSTAT) 0.4 MG SL tablet Place 1 tablet (0.4 mg total) under the tongue every 5 (five) minutes as needed. For chest pain 08/04/17  Yes [name], MD  pravastatin (PRAVACHOL) 80 MG tablet TAKE 1 TABLET BY MOUTH ONCE DAILY Patient taking differently: TAKE 80 mg  TABLET BY MOUTH ONCE DAILY 11/24/17  Yes [name], MD  OVER THE COUNTER MEDICATION Apply 1 drop topically as needed (dizziness).    [provider]    Physical Exam: Vitals:   01/07/18 1945 01/07/18 2030 01/07/18 2115 01/07/18 2200  BP: (!) 175/70 (!) 188/80 (!) 181/79 (!) 187/74  Pulse: 89 78 82 78  Resp: (!) 21 (!) 26 (!) 22 20  SpO2: 100% 100% 98% 98%   General: Not in acute distress. Dry mucus and membrane HEENT:       Eyes: PERRL, EOMI, no scleral icterus.       ENT: No discharge from the ears and nose, no pharynx injection, no tonsillar enlargement.        Neck: No JVD, no bruit, no mass felt. Heme: No neck lymph node enlargement. Cardiac: S1/S2, RRR, No murmurs, No gallops or rubs. Respiratory: No rales, wheezing, rhonchi or rubs. GI: Soft, nondistended, nontender, no rebound pain, no organomegaly, BS present. GU: No hematuria Ext: No pitting leg edema bilaterally. 2+DP/PT pulse bilaterally. Musculoskeletal: No joint deformities, No joint redness or warmth, no limitation of ROM in spin. Skin: No rashes.  Neuro: mildly confused, but still oriented X3, cranial nerves II-XII grossly intact, moves all extremities normally. Psych: Patient is not psychotic, no suicidal or hemocidal ideation.  Labs on Admission: I have personally reviewed following labs and imaging studies  CBC: Recent Labs  Lab  01/07/18 1551  WBC 6.4  HGB 11.6*  HCT 35.1*  MCV 95.6  PLT 782*   Basic Metabolic Panel: Recent Labs  Lab 01/07/18 1551  NA 142  K 4.5  CL 109  CO2 19*  GLUCOSE 150*  BUN 58*  CREATININE 2.68*  CALCIUM 9.1   GFR: CrCl cannot be calculated (Unknown ideal weight.). Liver Function Tests: No results for input(s): AST, ALT, ALKPHOS, BILITOT, PROT, ALBUMIN in the last 168 hours. No results for input(s): LIPASE, AMYLASE in the last 168 hours. Recent Labs  Lab 01/07/18 2312  AMMONIA 22   Coagulation Profile: No results for input(s): INR, PROTIME in the last 168 hours. Cardiac Enzymes: No results for input(s): CKTOTAL, CKMB, CKMBINDEX, TROPONINI in the last 168 hours. BNP (last 3 results) No results for input(s): PROBNP in the last 8760 hours. HbA1C: Recent Labs    01/07/18 1330  HGBA1C 6.4   CBG: No results for input(s): GLUCAP in the last 168 hours. Lipid Profile: No results for input(s): CHOL, HDL, LDLCALC, TRIG, CHOLHDL, LDLDIRECT in the last 72 hours. Thyroid Function  Tests: No results for input(s): TSH, T4TOTAL, FREET4, T3FREE, THYROIDAB in the last 72 hours. Anemia Panel: Recent Labs    01/07/18 2312  VITAMINB12 366   Urine analysis:    Component Value Date/Time   COLORURINE STRAW (A) 01/07/2018 1941   APPEARANCEUR CLEAR 01/07/2018 1941   LABSPEC 1.006 01/07/2018 1941   PHURINE 6.0 01/07/2018 1941   GLUCOSEU NEGATIVE 01/07/2018 1941   GLUCOSEU NEGATIVE 09/07/2014 1334   HGBUR SMALL (A) 01/07/2018 1941   BILIRUBINUR NEGATIVE 01/07/2018 1941   KETONESUR NEGATIVE 01/07/2018 1941   PROTEINUR 30 (A) 01/07/2018 1941   UROBILINOGEN 0.2 09/07/2014 1334   NITRITE NEGATIVE 01/07/2018 1941   LEUKOCYTESUR NEGATIVE 01/07/2018 1941   Sepsis Labs: @LABRCNTIP (procalcitonin:4,lacticidven:4) )No results found for this or any previous visit (from the past 240 hour(s)).   Radiological Exams on Admission: Ct Head Wo Contrast  Result Date: 01/07/2018 CLINICAL  DATA:  Altered level of consciousness. EXAM: CT HEAD WITHOUT CONTRAST TECHNIQUE: Contiguous axial images were obtained from the base of the skull through the vertex without intravenous contrast. COMPARISON:  Brain MRI 12/30/2017.  Head CT 11/15/2017 FINDINGS: Brain: Generalized atrophy unchanged from prior exam. Cerebral and cerebellar chronic small vessel ischemia, also unchanged. No intracranial hemorrhage, mass effect, or midline shift. No hydrocephalus. The basilar cisterns are patent. No evidence of territorial infarct or acute ischemia. No extra-axial or intracranial fluid collection. Vascular: Atherosclerosis of skullbase vasculature without hyperdense vessel or abnormal calcification. Skull: No fracture or focal lesion. Sinuses/Orbits: Mucosal thickening in the right maxillary sinus, chronic. No acute finding. Other: None. IMPRESSION: 1.  No acute intracranial abnormality. 2. Unchanged atrophy and chronic small vessel ischemia. Electronically Signed   By: Jeb Levering M.D.   On: 01/07/2018 23:13   Dg Chest Port 1 View  Result Date: 01/07/2018 CLINICAL DATA:  84 y/o M; increased confusion and weakness over a few months. EXAM: PORTABLE CHEST 1 VIEW COMPARISON:  12/30/2017 chest radiograph FINDINGS: Stable normal cardiac silhouette given projection and technique. Aortic atherosclerosis with calcification. Improved aeration of right middle lobe. No new focal consolidation. Emphysema. No pleural effusion or pneumothorax. No acute osseous abnormality is evident. IMPRESSION: Improved aeration in right middle lobe. No new focal consolidation. Aortic atherosclerosis. Stable cardiac silhouette. Electronically Signed   By: Kristine Garbe M.D.   On: 01/07/2018 16:57     EKG: Independently reviewed.  QTc 561, left bundle blockage, poor R-wave progression, LAD.  Assessment/Plan Principal Problem:   Acute metabolic encephalopathy Active Problems:   Dyslipidemia   Coronary atherosclerosis    Stroke (cerebrum) (HCC)   Acute renal failure superimposed on stage 3 chronic kidney disease (HCC)   Vertigo   LBBB (left bundle branch block)   Generalized weakness   Acute metabolic encephalopathy: Patient has worsening mental status, and easy agitation.  No signs of infection. Urinalysis negative. Etiology is not clear. Potential differential diagnoses include dementia with behavioral disturbance, frontal lobe stroke and psychosis. Patient doesn't have MRI and MRA on 12/30/17 which is not impressive.   -will admit to tele bed as inpt (pt may need placement) -Frequent neuro check -CT head without contrast -Check ammonia level, TSH, vitamin B12, folate RPR -May need psych consult in morning - prn Ativan for agitation.  HLD: -Pravastatin  Hx of stroke: -Aspirin and pravastatin  Coronary atherosclerosis: no CP -Continue aspirin, pravastatin -When necessary nitroglycerin  AoCKD-III: Baseline Cre is 1.6-1.8, pt's Cre is 2.68 and BUN 58 on admission. Likely due to prerenal secondary to dehydration - IVF: 1L NS,  then 100 cc/h - Check FeNa - Follow up renal function by BMP  Vertigo and poor balance, and generalized weakness -continue meclizine -pt/ot  HTN: Blood pressure is elevated at 182/80. Patient is not taking medications at home. -start amlodipine -IV hydralazine when necessary     DVT ppx: SQ Lovenox Code Status: Partial code (I discussed with his two sons, and explained the meaning of CODE STATUS, patient would want to be partial code, OK for CPR, but no intubation). Family Communication:    Yes, patient's two sons at bed side Disposition Plan:  Anticipate discharge back to previous home environment Consults called:  none Admission status:   Inpatient/tele      Date of Service 01/08/2018    Ivor Costa [facility] Pager [phone]  If 7PM-7AM, please contact night-coverage [email or web address] Password [email or web address] 01/08/2018, 12:45 AM

## 2018-01-07 NOTE — Telephone Encounter (Signed)
He was sent to hospital.  That is up to them

## 2018-01-07 NOTE — ED Provider Notes (Signed)
[facility] EMERGENCY DEPARTMENT Provider Note   CSN: [id number] Arrival date & time: 01/07/18  1457     History   Chief Complaint Chief Complaint  Patient presents with  . Altered Mental Status    HPI Chad Mcmillan is a 84 y.o. male.  Patient with c/o generalized weakness, intermittent dizziness, lightheaded/weak when stands. Patient denies current room spinning sensation. Pt denies fall or syncope. Denies headache. No neck or back pain. States has eaten/drank very little in past several days - is thirsty now. Denies chest pain or sob. No abd pain. No vomiting. No dysuria. Pt is a limited historian - level 5 caveat.    The history is provided by the patient and the EMS personnel. The history is limited by the condition of the patient.  Altered Mental Status   Associated symptoms include confusion and weakness.    Past Medical History:  Diagnosis Date  . ADENOCARCINOMA, PROSTATE 06/01/2008  . CORONARY ARTERY DISEASE 06/13/2007  . CVA 06/01/2008   x 2  . DIABETES MELLITUS, TYPE I, WITH OPHTHALMIC COMPLICATIONS 8/54/6270  . 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 HYPERKALEMIA 06/01/2008   due to DM neparopathy  . HYPERLIPIDEMIA 06/13/2007  . HYPERTENSION 06/13/2007  . NEOPLASM, MALIGNANT, SKIN, FACE 06/01/2008  . Other chronic nonalcoholic liver disease 3/50/0938  . RENAL DISEASE 06/01/2008    Patient Active Problem List   Diagnosis Date Noted  . Foot pain, bilateral 10/07/2017  . Memory loss 04/07/2017  . Numbness 03/06/2016  . Dizzy 01/31/2016  . Medication side effect 03/08/2015  . Pancytopenia ([place]) 07/27/2012  . Screening for prostate cancer 07/27/2012  . DM (diabetes mellitus) ([place]) 07/27/2012  . Anemia 07/27/2012  . Encounter for long-term (current) use of other medications 07/23/2011  . NASH (nonalcoholic steatohepatitis) 07/17/2010  . COUGH 02/06/2010  . PNEUMONIA, ORGANISM UNSPECIFIED 11/27/2009  . NEOPLASM, MALIGNANT, SKIN, FACE 06/01/2008  .  ADENOCARCINOMA, PROSTATE 06/01/2008  . HYPERKALEMIA 06/01/2008  . CVA 06/01/2008  . Disorder of kidney and ureter 06/01/2008  . NEOPLASM, MALIGNANT, SKIN, FACE 06/01/2008  . Dyslipidemia 06/13/2007  . Essential hypertension 06/13/2007  . Coronary atherosclerosis 06/13/2007    Past Surgical History:  Procedure Laterality Date  . Carotid Duplex  09/12/2004  . CATARACT EXTRACTION    . CHOLECYSTECTOMY    . ESOPHAGOGASTRODUODENOSCOPY  06/28/1991  . HERNIA REPAIR     right Inguinal hernia  . Rest/Stress Cardiolite  01/22/2001       Home Medications    Prior to Admission medications   Medication Sig Start Date End Date Taking? Authorizing Provider  acetaminophen (TYLENOL) 500 MG tablet Take 500-1,000 mg by mouth every 6 (six) hours as needed for headache (pain).    [provider]  amLODipine (NORVASC) 2.5 MG tablet Take 1 tablet (2.5 mg total) by mouth daily. Patient not taking: Reported on 12/30/2017 07/18/17   [name], MD  aspirin EC 81 MG tablet Take 1 tablet (81 mg total) by mouth daily. 08/04/17   [name], MD  Blood Glucose Monitoring Suppl (ONE TOUCH ULTRA 2) W/DEVICE KIT Use to check blood sugar 2 times per day. Dx Code E11.22 06/09/15   [name], MD  furosemide (LASIX) 20 MG tablet Take 20 mg by mouth daily. 12/24/17   [provider]  glucose blood (ONE TOUCH ULTRA TEST) test strip USE ONE STRIP TO CHECK GLUCOSE TWICE DAILY AS DIRECTED DX CODE E11.22. 11/26/17   [name], MD  insulin lispro (HUMALOG) 100 UNIT/ML  injection 3 times a day (just before each meal), 15-10-10 units Patient taking differently: Inject 3-25 Units into the skin once. 3 times a day (just before each meal), 15-10-10 units 10/07/17   [name], MD  insulin NPH Human (HUMULIN N) 100 UNIT/ML injection Inject 0.05 mLs (5 Units total) into the skin at bedtime. 07/07/17   [name], MD  Insulin Syringe-Needle U-100 (B-D INS SYR ULTRAFINE .3CC/30G) 30G X 1/2" 0.3 ML MISC  USE AS DIRECTED 4 TIMES DAILY 11/26/17   [name], MD  meclizine (ANTIVERT) 12.5 MG tablet Take 1 tablet (12.5 mg total) by mouth 3 (three) times daily as needed for dizziness. Patient taking differently: Take 12.5 mg by mouth 3 (three) times daily.  11/20/17   [name], MD  nitroGLYCERIN (NITROSTAT) 0.4 MG SL tablet Place 1 tablet (0.4 mg total) under the tongue every 5 (five) minutes as needed. For chest pain 08/04/17   [name], MD  [facility] DELICA LANCETS 38B MISC USE TO CHECK GLUCOSE TWICE DAILY AS DIRECTED 12/09/17   [name], MD  OVER THE COUNTER MEDICATION Apply 1 drop topically as needed (dizziness).    [provider]  pravastatin (PRAVACHOL) 80 MG tablet TAKE 1 TABLET BY MOUTH ONCE DAILY 11/24/17   [name], MD    Family History No family history on file.  Social History Social History   Tobacco Use  . Smoking status: Former Smoker    Last attempt to quit: 11/18/1978    Years since quitting: 39.1  . Smokeless tobacco: Never Used  Substance Use Topics  . Alcohol use: No  . Drug use: No     Allergies   Patient has no known allergies.   Review of Systems Review of Systems  Constitutional: Negative for fever.  HENT: Negative for sore throat.   Eyes: Negative for redness.  Respiratory: Negative for shortness of breath.   Cardiovascular: Negative for chest pain.  Gastrointestinal: Negative for abdominal pain and vomiting.  Genitourinary: Negative for flank pain.  Musculoskeletal: Negative for neck pain.  Skin: Negative for rash.  Neurological: Positive for weakness and light-headedness. Negative for headaches.  Hematological: Does not bruise/bleed easily.  Psychiatric/Behavioral: Positive for confusion.     Physical Exam Updated Vital Signs There were no vitals taken for this visit.  Physical Exam  Constitutional: He appears well-developed and well-nourished. No distress.  HENT:  Head: Atraumatic.  Mouth/Throat: Oropharynx is clear  and moist.  Dry MM  Eyes: Conjunctivae are normal.  Neck: Neck supple. No tracheal deviation present. No thyromegaly present.  No stiffness or rigidity. No bruit.   Cardiovascular: Normal rate, regular rhythm, normal heart sounds and intact distal pulses. Exam reveals no gallop and no friction rub.  No murmur heard. Pulmonary/Chest: Effort normal and breath sounds normal. No accessory muscle usage. No respiratory distress.  Abdominal: Soft. Bowel sounds are normal. He exhibits no distension. There is no tenderness.  Genitourinary:  Genitourinary Comments: No cva tenderness  Musculoskeletal: He exhibits no edema.  Neurological: He is alert.  Speech clear/fluent. Motor intact bil, stre 5/5. sens grossly intact. No pronator drift.   Skin: Skin is warm and dry. He is not diaphoretic.  Psychiatric: He has a normal mood and affect.  Nursing note and vitals reviewed.    ED Treatments / Results  Labs (all labs ordered are listed, but only abnormal results are displayed) Results for orders placed or performed during the hospital encounter of 01/07/18  CBC  Result Value Ref Range  WBC 6.4 4.0 - 10.5 K/uL   RBC 3.67 (L) 4.22 - 5.81 MIL/uL   Hemoglobin 11.6 (L) 13.0 - 17.0 g/dL   HCT 35.1 (L) 39.0 - 52.0 %   MCV 95.6 78.0 - 100.0 fL   MCH 31.6 26.0 - 34.0 pg   MCHC 33.0 30.0 - 36.0 g/dL   RDW 13.3 11.5 - 15.5 %   Platelets 131 (L) 150 - 400 K/uL  Basic metabolic panel  Result Value Ref Range   Sodium 142 135 - 145 mmol/L   Potassium 4.5 3.5 - 5.1 mmol/L   Chloride 109 101 - 111 mmol/L   CO2 19 (L) 22 - 32 mmol/L   Glucose, Bld 150 (H) 65 - 99 mg/dL   BUN 58 (H) 6 - 20 mg/dL   Creatinine, Ser 2.68 (H) 0.61 - 1.24 mg/dL   Calcium 9.1 8.9 - 10.3 mg/dL   GFR calc non Af Amer 19 (L) >60 mL/min   GFR calc Af Amer 22 (L) >60 mL/min   Anion gap 14 5 - 15  Urinalysis, Routine w reflex microscopic  Result Value Ref Range   Color, Urine STRAW (A) YELLOW   APPearance CLEAR CLEAR    Specific Gravity, Urine 1.006 1.005 - 1.030   pH 6.0 5.0 - 8.0   Glucose, UA NEGATIVE NEGATIVE mg/dL   Hgb urine dipstick SMALL (A) NEGATIVE   Bilirubin Urine NEGATIVE NEGATIVE   Ketones, ur NEGATIVE NEGATIVE mg/dL   Protein, ur 30 (A) NEGATIVE mg/dL   Nitrite NEGATIVE NEGATIVE   Leukocytes, UA NEGATIVE NEGATIVE   RBC / HPF 0-5 0 - 5 RBC/hpf   WBC, UA 0-5 0 - 5 WBC/hpf   Bacteria, UA NONE SEEN NONE SEEN   Squamous Epithelial / LPF NONE SEEN NONE SEEN  I-stat troponin, ED  Result Value Ref Range   Troponin i, poc 0.07 0.00 - 0.08 ng/mL   Comment 3           Dg Chest 2 View  Result Date: 12/30/2017 CLINICAL DATA:  Vertigo EXAM: CHEST  2 VIEW COMPARISON:  04/23/2016 FINDINGS: Heart is top-normal in size. There is moderate aortic atherosclerosis along the transverse portion without aneurysm. Patchy airspace opacities are redemonstrated in the right middle lobe distribution compatible with pneumonia. Blunting the costophrenic angles compatible small pleural effusions. No overt pulmonary edema. Emphysematous hyperinflation of the lungs upper lobe predominant. No acute osseous abnormality. Degenerative changes are seen along the dorsal spine with osteopenia. IMPRESSION: 1. Redemonstration of right middle lobe airspace opacities suspicious for pneumonia and atelectasis. 2. Moderate aortic atherosclerosis. 3. Emphysema. Electronically Signed   By: Ashley Royalty M.D.   On: 12/30/2017 21:17   Mr Angiogram Head Wo Contrast  Result Date: 12/30/2017 CLINICAL DATA:  84 year old male with persistent vertigo, with progressive symptoms recently. Difficulty walking. Occasional double vision. EXAM: MRI HEAD WITHOUT CONTRAST MRA HEAD WITHOUT CONTRAST MRA NECK WITHOUT CONTRAST TECHNIQUE: Multiplanar, multiecho pulse sequences of the brain and surrounding structures were obtained without intravenous contrast. Angiographic images of the Circle of Willis were obtained using MRA technique without intravenous contrast.  Angiographic images of the neck were obtained using MRA technique without intravenous contrast. Carotid stenosis measurements (when applicable) are obtained utilizing NASCET criteria, using the distal internal carotid diameter as the denominator. COMPARISON:  Head CTs without contrast 11/15/2017 and earlier. Brain MRI 02/15/2016. Neck MRA 10/26/2004. FINDINGS: MRI HEAD FINDINGS Brain: Cerebral volume is not significantly changed since 2017. No restricted diffusion to suggest acute infarction. No midline  shift, mass effect, evidence of mass lesion, ventriculomegaly, extra-axial collection or acute intracranial hemorrhage. Cervicomedullary junction and pituitary are within normal limits. Small chronic infarcts in both cerebellar hemispheres are stable. Chronic lacunar infarcts suspected in the central pons with associated chronic T2 hyperintensity. Minimal to mild for age superimposed chronic T2 heterogeneity in the deep gray matter nuclei. Mild to moderate for age patchy bilateral cerebral white matter T2 and FLAIR hyperintensity, mostly periventricular, has not significantly progressed. No cerebral cortical encephalomalacia or chronic cerebral blood products identified. Vascular: Major intracranial vascular flow voids are stable since 2017, the distal right vertebral artery demonstrates chronic slow flow or occlusion, see below. Dominant appearing distal left vertebral artery. Stable anterior circulation flow voids. Skull and upper cervical spine: Negative visible cervical spine. Normal bone marrow signal. Sinuses/Orbits: Combined fluid level and inspissated material within the right maxillary sinus is new since 2017. Other paranasal sinuses remain well pneumatized. Stable orbits soft tissues. Other: Visible internal auditory structures appear normal. Mastoid air cells remain clear. Scalp and face soft tissues appear negative. MRA NECK FINDINGS Time-of-flight neck MRA reveals antegrade flow in both cervical carotid  arteries to the skull base. Irregularity at the carotid bifurcations is greater on the right, although no proximal right ICA stenosis is estimated at less than 50 % with respect to the distal vessel. Dominant appearance of the left vertebral artery throughout the neck is similar to the 2005 study. Antegrade flow signal in the smaller cervical right vertebral artery is faint but present. MRA HEAD FINDINGS There is flow signal in the distal right vertebral artery at the origin of the right PICA. There is severe irregularity and stenosis of the right V4 segment distal to the PICA (series 406, image 14). Normal flow signal in the dominant distal left vertebral artery which appears to supply the basilar. Patent basilar artery without stenosis. Patent AICA and SCA origins. Right posterior communicating artery is present while the left is diminutive or absent. There is only mild irregularity of the bilateral PCA branches which demonstrate symmetric distal flow signal. Antegrade flow in both ICA siphons. There is moderate bilateral ICA siphon irregularity compatible with atherosclerosis. Mild-to-moderate bilateral supraclinoid ICA segment stenosis results. Ophthalmic artery origins appear normal. Patent carotid termini. Normal MCA and ACA origins. Anterior communicating artery and visible ACA branches are within normal limits. Bilateral MCA M1 segments and MCA bifurcations appear normal. Visible bilateral MCA branches appear normal. IMPRESSION: 1.  No acute intracranial abnormality. 2. Stable noncontrast MRI appearance of the brain since 2017, notable for chronic small vessel disease primarily in the cerebellum and pons. 3. Severe chronic atherosclerosis and stenosis of the distal right vertebral artery, although the right PICA origin remains patent. 4. Dominant left vertebral artery without stenosis, and otherwise negative posterior circulation. 5. Right greater than left carotid bifurcation, and bilateral ICA siphon  atherosclerosis. But no definite hemodynamically significant carotid stenosis. And otherwise negative anterior circulation. 6. Acute on chronic right maxillary sinus inflammation is new since 2017. Electronically Signed   By: Genevie Ann M.D.   On: 12/30/2017 18:59   Mr Jodene Nam Neck Wo Contrast  Result Date: 12/30/2017 CLINICAL DATA:  84 year old male with persistent vertigo, with progressive symptoms recently. Difficulty walking. Occasional double vision. EXAM: MRI HEAD WITHOUT CONTRAST MRA HEAD WITHOUT CONTRAST MRA NECK WITHOUT CONTRAST TECHNIQUE: Multiplanar, multiecho pulse sequences of the brain and surrounding structures were obtained without intravenous contrast. Angiographic images of the Circle of Willis were obtained using MRA technique without intravenous contrast. Angiographic images  of the neck were obtained using MRA technique without intravenous contrast. Carotid stenosis measurements (when applicable) are obtained utilizing NASCET criteria, using the distal internal carotid diameter as the denominator. COMPARISON:  Head CTs without contrast 11/15/2017 and earlier. Brain MRI 02/15/2016. Neck MRA 10/26/2004. FINDINGS: MRI HEAD FINDINGS Brain: Cerebral volume is not significantly changed since 2017. No restricted diffusion to suggest acute infarction. No midline shift, mass effect, evidence of mass lesion, ventriculomegaly, extra-axial collection or acute intracranial hemorrhage. Cervicomedullary junction and pituitary are within normal limits. Small chronic infarcts in both cerebellar hemispheres are stable. Chronic lacunar infarcts suspected in the central pons with associated chronic T2 hyperintensity. Minimal to mild for age superimposed chronic T2 heterogeneity in the deep gray matter nuclei. Mild to moderate for age patchy bilateral cerebral white matter T2 and FLAIR hyperintensity, mostly periventricular, has not significantly progressed. No cerebral cortical encephalomalacia or chronic cerebral  blood products identified. Vascular: Major intracranial vascular flow voids are stable since 2017, the distal right vertebral artery demonstrates chronic slow flow or occlusion, see below. Dominant appearing distal left vertebral artery. Stable anterior circulation flow voids. Skull and upper cervical spine: Negative visible cervical spine. Normal bone marrow signal. Sinuses/Orbits: Combined fluid level and inspissated material within the right maxillary sinus is new since 2017. Other paranasal sinuses remain well pneumatized. Stable orbits soft tissues. Other: Visible internal auditory structures appear normal. Mastoid air cells remain clear. Scalp and face soft tissues appear negative. MRA NECK FINDINGS Time-of-flight neck MRA reveals antegrade flow in both cervical carotid arteries to the skull base. Irregularity at the carotid bifurcations is greater on the right, although no proximal right ICA stenosis is estimated at less than 50 % with respect to the distal vessel. Dominant appearance of the left vertebral artery throughout the neck is similar to the 2005 study. Antegrade flow signal in the smaller cervical right vertebral artery is faint but present. MRA HEAD FINDINGS There is flow signal in the distal right vertebral artery at the origin of the right PICA. There is severe irregularity and stenosis of the right V4 segment distal to the PICA (series 406, image 14). Normal flow signal in the dominant distal left vertebral artery which appears to supply the basilar. Patent basilar artery without stenosis. Patent AICA and SCA origins. Right posterior communicating artery is present while the left is diminutive or absent. There is only mild irregularity of the bilateral PCA branches which demonstrate symmetric distal flow signal. Antegrade flow in both ICA siphons. There is moderate bilateral ICA siphon irregularity compatible with atherosclerosis. Mild-to-moderate bilateral supraclinoid ICA segment stenosis  results. Ophthalmic artery origins appear normal. Patent carotid termini. Normal MCA and ACA origins. Anterior communicating artery and visible ACA branches are within normal limits. Bilateral MCA M1 segments and MCA bifurcations appear normal. Visible bilateral MCA branches appear normal. IMPRESSION: 1.  No acute intracranial abnormality. 2. Stable noncontrast MRI appearance of the brain since 2017, notable for chronic small vessel disease primarily in the cerebellum and pons. 3. Severe chronic atherosclerosis and stenosis of the distal right vertebral artery, although the right PICA origin remains patent. 4. Dominant left vertebral artery without stenosis, and otherwise negative posterior circulation. 5. Right greater than left carotid bifurcation, and bilateral ICA siphon atherosclerosis. But no definite hemodynamically significant carotid stenosis. And otherwise negative anterior circulation. 6. Acute on chronic right maxillary sinus inflammation is new since 2017. Electronically Signed   By: Genevie Ann M.D.   On: 12/30/2017 18:59   Mr Brain Wo Contrast  Result Date: 12/30/2017 CLINICAL DATA:  84 year old male with persistent vertigo, with progressive symptoms recently. Difficulty walking. Occasional double vision. EXAM: MRI HEAD WITHOUT CONTRAST MRA HEAD WITHOUT CONTRAST MRA NECK WITHOUT CONTRAST TECHNIQUE: Multiplanar, multiecho pulse sequences of the brain and surrounding structures were obtained without intravenous contrast. Angiographic images of the Circle of Willis were obtained using MRA technique without intravenous contrast. Angiographic images of the neck were obtained using MRA technique without intravenous contrast. Carotid stenosis measurements (when applicable) are obtained utilizing NASCET criteria, using the distal internal carotid diameter as the denominator. COMPARISON:  Head CTs without contrast 11/15/2017 and earlier. Brain MRI 02/15/2016. Neck MRA 10/26/2004. FINDINGS: MRI HEAD FINDINGS  Brain: Cerebral volume is not significantly changed since 2017. No restricted diffusion to suggest acute infarction. No midline shift, mass effect, evidence of mass lesion, ventriculomegaly, extra-axial collection or acute intracranial hemorrhage. Cervicomedullary junction and pituitary are within normal limits. Small chronic infarcts in both cerebellar hemispheres are stable. Chronic lacunar infarcts suspected in the central pons with associated chronic T2 hyperintensity. Minimal to mild for age superimposed chronic T2 heterogeneity in the deep gray matter nuclei. Mild to moderate for age patchy bilateral cerebral white matter T2 and FLAIR hyperintensity, mostly periventricular, has not significantly progressed. No cerebral cortical encephalomalacia or chronic cerebral blood products identified. Vascular: Major intracranial vascular flow voids are stable since 2017, the distal right vertebral artery demonstrates chronic slow flow or occlusion, see below. Dominant appearing distal left vertebral artery. Stable anterior circulation flow voids. Skull and upper cervical spine: Negative visible cervical spine. Normal bone marrow signal. Sinuses/Orbits: Combined fluid level and inspissated material within the right maxillary sinus is new since 2017. Other paranasal sinuses remain well pneumatized. Stable orbits soft tissues. Other: Visible internal auditory structures appear normal. Mastoid air cells remain clear. Scalp and face soft tissues appear negative. MRA NECK FINDINGS Time-of-flight neck MRA reveals antegrade flow in both cervical carotid arteries to the skull base. Irregularity at the carotid bifurcations is greater on the right, although no proximal right ICA stenosis is estimated at less than 50 % with respect to the distal vessel. Dominant appearance of the left vertebral artery throughout the neck is similar to the 2005 study. Antegrade flow signal in the smaller cervical right vertebral artery is faint but  present. MRA HEAD FINDINGS There is flow signal in the distal right vertebral artery at the origin of the right PICA. There is severe irregularity and stenosis of the right V4 segment distal to the PICA (series 406, image 14). Normal flow signal in the dominant distal left vertebral artery which appears to supply the basilar. Patent basilar artery without stenosis. Patent AICA and SCA origins. Right posterior communicating artery is present while the left is diminutive or absent. There is only mild irregularity of the bilateral PCA branches which demonstrate symmetric distal flow signal. Antegrade flow in both ICA siphons. There is moderate bilateral ICA siphon irregularity compatible with atherosclerosis. Mild-to-moderate bilateral supraclinoid ICA segment stenosis results. Ophthalmic artery origins appear normal. Patent carotid termini. Normal MCA and ACA origins. Anterior communicating artery and visible ACA branches are within normal limits. Bilateral MCA M1 segments and MCA bifurcations appear normal. Visible bilateral MCA branches appear normal. IMPRESSION: 1.  No acute intracranial abnormality. 2. Stable noncontrast MRI appearance of the brain since 2017, notable for chronic small vessel disease primarily in the cerebellum and pons. 3. Severe chronic atherosclerosis and stenosis of the distal right vertebral artery, although the right PICA origin remains patent. 4. Dominant left  vertebral artery without stenosis, and otherwise negative posterior circulation. 5. Right greater than left carotid bifurcation, and bilateral ICA siphon atherosclerosis. But no definite hemodynamically significant carotid stenosis. And otherwise negative anterior circulation. 6. Acute on chronic right maxillary sinus inflammation is new since 2017. Electronically Signed   By: Genevie Ann M.D.   On: 12/30/2017 18:59   Dg Chest Port 1 View  Result Date: 01/07/2018 CLINICAL DATA:  84 y/o M; increased confusion and weakness over a few  months. EXAM: PORTABLE CHEST 1 VIEW COMPARISON:  12/30/2017 chest radiograph FINDINGS: Stable normal cardiac silhouette given projection and technique. Aortic atherosclerosis with calcification. Improved aeration of right middle lobe. No new focal consolidation. Emphysema. No pleural effusion or pneumothorax. No acute osseous abnormality is evident. IMPRESSION: Improved aeration in right middle lobe. No new focal consolidation. Aortic atherosclerosis. Stable cardiac silhouette. Electronically Signed   By: Kristine Garbe M.D.   On: 01/07/2018 16:57    EKG  EKG Interpretation None       Radiology Dg Chest Port 1 View  Result Date: 01/07/2018 CLINICAL DATA:  84 y/o M; increased confusion and weakness over a few months. EXAM: PORTABLE CHEST 1 VIEW COMPARISON:  12/30/2017 chest radiograph FINDINGS: Stable normal cardiac silhouette given projection and technique. Aortic atherosclerosis with calcification. Improved aeration of right middle lobe. No new focal consolidation. Emphysema. No pleural effusion or pneumothorax. No acute osseous abnormality is evident. IMPRESSION: Improved aeration in right middle lobe. No new focal consolidation. Aortic atherosclerosis. Stable cardiac silhouette. Electronically Signed   By: Kristine Garbe M.D.   On: 01/07/2018 16:57    Procedures Procedures (including critical care time)  Medications Ordered in ED Medications - No data to display   Initial Impression / Assessment and Plan / ED Course  I have reviewed the triage vital signs and the nursing notes.  Pertinent labs & imaging results that were available during my care of the patient were reviewed by me and considered in my medical decision making (see chart for details).  Iv ns. Labs.  Cxr.  Reviewed nursing notes and prior charts for additional history.   Iv ns bolus.   Awaiting ua.   Labs reviewed - elevated renal fxn compared to prior, hc03 low, c/w dehydration.  xrays  reviewed - no pna.   Additional fluids.  Initial hr is fast - improved w ivf.   aki on labs.   Will consult med service for admission.  Final Clinical Impressions(s) / ED Diagnoses   Final diagnoses:  None    ED Discharge Orders    None       Lajean Saver, MD 01/07/18 2133

## 2018-01-07 NOTE — ED Triage Notes (Signed)
Per EMS: Pt was a his endocrinologist appt for a regular check-up, when pt's son stated to MD that his father has increasingly became more altered along with increasingly worse tremors since July.  Pt c/o dizziness due to his vertigo.  Denies CP and sob. Pt was diagnosed with PNA about a week ago and it is uncertain if pt has taken the full regimen of antibiotics.  PTA vitals: BP 202/78, 90HR, CBG 119.

## 2018-01-07 NOTE — ED Notes (Signed)
Admitting at bedside 

## 2018-01-07 NOTE — Telephone Encounter (Signed)
furosemide (LASIX) 20 MG tablet  [facility] called and would like to know if pt is taking this medication. pts son stated that he was suppose to be taking it but that they believe that Dr has taken him off of this,.   They need clarification also on  insulin lispro (HUMALOG) 100 UNIT/ML injection   Please advise   [phone] Jeanetta.

## 2018-01-07 NOTE — Telephone Encounter (Signed)
I called & clarified prescriptions with Jeanetta. I stated that as of 1/03 per Dr. Cordelia Pen note for patient to stay off Lasix & metoprolol.

## 2018-01-07 NOTE — Telephone Encounter (Signed)
I see in your note from 1/03 for pateint to stay off Lasix. Is patient supposed to continue staying off?

## 2018-01-07 NOTE — Progress Notes (Signed)
Subjective:    Patient ID: Chad Mcmillan, male    DOB: 09-08-1924, 84 y.o.   MRN: [id number]  HPI Pt was seen in ER last week, and was rx'ed several meds.  Hx is from son, who says he does not take meds as rx'ed. He has moderate tremor of the hands, and assoc sob.  Past Medical History:  Diagnosis Date  . ADENOCARCINOMA, PROSTATE 06/01/2008  . CORONARY ARTERY DISEASE 06/13/2007  . CVA 06/01/2008   x 2  . DIABETES MELLITUS, TYPE I, WITH OPHTHALMIC COMPLICATIONS 0/62/6948  . 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 HYPERKALEMIA 06/01/2008   due to DM neparopathy  . HYPERLIPIDEMIA 06/13/2007  . HYPERTENSION 06/13/2007  . NEOPLASM, MALIGNANT, SKIN, FACE 06/01/2008  . Other chronic nonalcoholic liver disease 5/46/2703  . RENAL DISEASE 06/01/2008    Past Surgical History:  Procedure Laterality Date  . Carotid Duplex  09/12/2004  . CATARACT EXTRACTION    . CHOLECYSTECTOMY    . ESOPHAGOGASTRODUODENOSCOPY  06/28/1991  . HERNIA REPAIR     right Inguinal hernia  . Rest/Stress Cardiolite  01/22/2001    Social History   Socioeconomic History  . Marital status: Married    Spouse name: Not on file  . Number of children: Not on file  . Years of education: Not on file  . Highest education level: Not on file  Social Needs  . Financial resource strain: Not on file  . Food insecurity - worry: Not on file  . Food insecurity - inability: Not on file  . Transportation needs - medical: Not on file  . Transportation needs - non-medical: Not on file  Occupational History  . Occupation: Retired    Fish farm manager: RETIRED  Tobacco Use  . Smoking status: Former Smoker    Last attempt to quit: 11/18/1978    Years since quitting: 39.1  . Smokeless tobacco: Never Used  Substance and Sexual Activity  . Alcohol use: No  . Drug use: No  . Sexual activity: No    Birth control/protection: None  Other Topics Concern  . Not on file  Social History Narrative  . Not on file    Current Outpatient Medications on File Prior  to Visit  Medication Sig Dispense Refill  . Acetaminophen 500 MG coapsule Take 500-1,000 mg by mouth every 6 (six) hours as needed for headache (pain).    Marland Kitchen aspirin EC 81 MG tablet Take 1 tablet (81 mg total) by mouth daily. 90 tablet 3  . insulin lispro (HUMALOG) 100 UNIT/ML injection 3 times a day (just before each meal), 15-10-10 units (Patient taking differently: Inject 3-25 Units into the skin once. 3 times a day (just before each meal), 15-10-10 units) 20 mL 1  . insulin NPH Human (HUMULIN N) 100 UNIT/ML injection Inject 0.05 mLs (5 Units total) into the skin at bedtime. 10 mL 2  . meclizine (ANTIVERT) 12.5 MG tablet Take 1 tablet (12.5 mg total) by mouth 3 (three) times daily as needed for dizziness. (Patient taking differently: Take 12.5 mg by mouth 3 (three) times daily. ) 30 tablet 3  . nitroGLYCERIN (NITROSTAT) 0.4 MG SL tablet Place 1 tablet (0.4 mg total) under the tongue every 5 (five) minutes as needed. For chest pain 25 tablet 3  . OVER THE COUNTER MEDICATION Apply 1 drop topically as needed (dizziness).    . pravastatin (PRAVACHOL) 80 MG tablet TAKE 1 TABLET BY MOUTH ONCE DAILY (Patient taking differently: TAKE 80 mg  TABLET BY MOUTH ONCE DAILY)  30 tablet 5   No current facility-administered medications on file prior to visit.     No Known Allergies  No family history on file.  BP (!) 166/92 (BP Location: Left Arm, Patient Position: Sitting, Cuff Size: Large)   Pulse (!) 144   Temp 98.1 F (36.7 C) (Oral)   Ht 6' (1.829 m)   BMI 25.90 kg/m    Review of Systems He is unsteady on his feet.  Son says pt's memory is worse, and he argues with family members.      Objective:   Physical Exam VITAL SIGNS:  See vs page.  We are unable to obtain 02 sat, due to severe tremor.   GENERAL: no distress.  In wheelchair.   LUNGS:  Clear to auscultation, except for a few rales in the bases.  PSYCH: very anxious. Oriented x 3, except says it is Sept, 2019.       Assessment & Plan:   AMS, new Pneumonia: therapy limited by med noncompliance.   I called [facility] ER triage.  No answer. 911 is called, for transport to ER

## 2018-01-08 ENCOUNTER — Other Ambulatory Visit: Payer: Self-pay

## 2018-01-08 LAB — CREATININE, URINE, RANDOM: Creatinine, Urine: 16.52 mg/dL

## 2018-01-08 LAB — GLUCOSE, CAPILLARY
GLUCOSE-CAPILLARY: 278 mg/dL — AB (ref 65–99)
GLUCOSE-CAPILLARY: 179 mg/dL — AB (ref 65–99)
Glucose-Capillary: 171 mg/dL — ABNORMAL HIGH (ref 65–99)
Glucose-Capillary: 190 mg/dL — ABNORMAL HIGH (ref 65–99)

## 2018-01-08 LAB — SODIUM, URINE, RANDOM: Sodium, Ur: 119 mmol/L

## 2018-01-08 LAB — RPR: RPR: NONREACTIVE

## 2018-01-08 LAB — VITAMIN B12: Vitamin B-12: 366 pg/mL (ref 180–914)

## 2018-01-08 LAB — AMMONIA: Ammonia: 22 umol/L (ref 9–35)

## 2018-01-08 LAB — TSH: TSH: 2.745 u[IU]/mL (ref 0.350–4.500)

## 2018-01-08 MED ORDER — HYDRALAZINE HCL 20 MG/ML IJ SOLN
10.0000 mg | Freq: Once | INTRAMUSCULAR | Status: AC
  Administered 2018-01-08: 10 mg via INTRAVENOUS
  Filled 2018-01-08: qty 1

## 2018-01-08 MED ORDER — MECLIZINE HCL 12.5 MG PO TABS: 12.5000 mg | ORAL_TABLET | Freq: Three times a day (TID) | ORAL | Status: DC | PRN

## 2018-01-08 NOTE — Progress Notes (Signed)
Patient arrived around 0200 from ED, confused and anxious has various abrasions and bruises on arms legs and chest and back, also has bulging area above pubis not causing him pain and is soft. He has urinary urgency but only urinates 200 ml at a time.Very poor balance and  upper left arm according to son has been broken before and poorly set. Telly is on will continue to monitor.

## 2018-01-08 NOTE — Progress Notes (Signed)
PT recommending [place] services but feel he is appropriate for [facility]. CM met with the patient and his son and they are interested in the program. CM notified Tommi Rumps with [facility] and he feels the patient is appropriate for the program. CM will follow.

## 2018-01-08 NOTE — Progress Notes (Signed)
PROGRESS NOTE    Chad Mcmillan  NUU:[id number] DOB: [birth year] DOA: 01/07/2018 PCP: [name], MD  Brief Narrative:hyperlipidemia, diabetes mellitus, stroke, CKD-3, CAD, prostate cancer, vertigo, Karlene Lineman, left bundle blockage, who presents with confusion, agitation, generalized weakness. Per pt's son, pt mental status has been declining since last June, much worse in the past 2-3 months. Pt is more confused in the past several days. He is easily agitated and gets angry with others. He has generalized weakness and poor balance. He fell many times without significant injury In ED, workup unremarkable except mild AKI on CKD3  Assessment & Plan:   Weakness/Debility/falls/confusion -Clinically suggestive of ongoing failure to thrive and gait instability, could also have some senile dementia, also has history of intermittent long-standing vertigo - Has worsening acute renal failure and was on diuretics recently so could have a component of dehydration which is contributing -TSH, B-12 unremarkable, no signs or symptoms of infection -He also had an MRI/MRA of his brain on 2/12 in the emergency room which was negative for any acute findings -Continue IV fluids today -PT OT evaluation -May benefit from rehabilitation  Acute kidney injury on chronic kidney disease stage III - Baseline creatinine around 1.8-2 range, creatinine 2.6 on admission -Continue IV fluids today, cutdown rate  History of CAD -No chest pain, continue aspirin and statin  History of vertigo -PT eval, continue when necessary meclizine, MRI negative for cerebellar disease  History of CVA -Continue aspirin, statin  Essential hypertension -Started on amlodipine this admit    DVT prophylaxis: Lovenox Code Status: Partial code Family Communication: No family at bedside Disposition Plan: Home versus rehabilitation  Consultants:   None   Procedures:   Antimicrobials:    Subjective: -Feels better, has ongoing  balance problems and dizziness which is intermittent for 5 years  Objective: Vitals:   01/08/18 0133 01/08/18 0432 01/08/18 0629 01/08/18 0800  BP: (!) 209/91 (!) 193/82 (!) 142/54   Pulse: 97 84 82 98  Resp: 20 20 20 19   Temp: 98.2 F (36.8 C) 98.2 F (36.8 C)  97.6 F (36.4 C)  TempSrc: Oral Oral  Oral  SpO2: 100% 100% 98% 99%  Weight:      Height:        Intake/Output Summary (Last 24 hours) at 01/08/2018 1150 Last data filed at 01/08/2018 1137 Gross per 24 hour  Intake 1940 ml  Output 1140 ml  Net 800 ml   Filed Weights   01/08/18 0121  Weight: 81.2 kg (179 lb 0.2 oz)    Examination:  General exam: Appears calm and comfortable, no distress, alert awake oriented to self and place Respiratory system: Clear to auscultation. Respiratory effort normal. Cardiovascular system: S1 & S2 heard, RRR  Gastrointestinal system: Abdomen is nondistended, soft and nontender.Normal bowel sounds heard. Central nervous system: Alert and orientedx2. No focal neurological deficits. Extremities: Symmetric 5 x 5 power. Skin: No rashes, lesions or ulcers Psychiatry: Judgement and insight appear normal. Mood & affect appropriate.     Data Reviewed:   CBC: Recent Labs  Lab 01/07/18 1551  WBC 6.4  HGB 11.6*  HCT 35.1*  MCV 95.6  PLT 347*   Basic Metabolic Panel: Recent Labs  Lab 01/07/18 1551  NA 142  K 4.5  CL 109  CO2 19*  GLUCOSE 150*  BUN 58*  CREATININE 2.68*  CALCIUM 9.1   GFR: Estimated Creatinine Clearance: 20.3 mL/min (A) (by C-G formula based on SCr of 2.68 mg/dL (H)). Liver Function Tests: No results  for input(s): AST, ALT, ALKPHOS, BILITOT, PROT, ALBUMIN in the last 168 hours. No results for input(s): LIPASE, AMYLASE in the last 168 hours. Recent Labs  Lab 01/07/18 2312  AMMONIA 22   Coagulation Profile: No results for input(s): INR, PROTIME in the last 168 hours. Cardiac Enzymes: No results for input(s): CKTOTAL, CKMB, CKMBINDEX, TROPONINI in the  last 168 hours. BNP (last 3 results) No results for input(s): PROBNP in the last 8760 hours. HbA1C: Recent Labs    01/07/18 1330  HGBA1C 6.4   CBG: Recent Labs  Lab 01/08/18 0627 01/08/18 1102  GLUCAP 171* 278*   Lipid Profile: No results for input(s): CHOL, HDL, LDLCALC, TRIG, CHOLHDL, LDLDIRECT in the last 72 hours. Thyroid Function Tests: Recent Labs    01/08/18 0200  TSH 2.745   Anemia Panel: Recent Labs    01/07/18 2312  VITAMINB12 366   Urine analysis:    Component Value Date/Time   COLORURINE STRAW (A) 01/07/2018 1941   APPEARANCEUR CLEAR 01/07/2018 1941   LABSPEC 1.006 01/07/2018 1941   PHURINE 6.0 01/07/2018 1941   GLUCOSEU NEGATIVE 01/07/2018 1941   GLUCOSEU NEGATIVE 09/07/2014 1334   HGBUR SMALL (A) 01/07/2018 1941   BILIRUBINUR NEGATIVE 01/07/2018 1941   KETONESUR NEGATIVE 01/07/2018 1941   PROTEINUR 30 (A) 01/07/2018 1941   UROBILINOGEN 0.2 09/07/2014 1334   NITRITE NEGATIVE 01/07/2018 1941   LEUKOCYTESUR NEGATIVE 01/07/2018 1941   Sepsis Labs: @LABRCNTIP (procalcitonin:4,lacticidven:4)  )No results found for this or any previous visit (from the past 240 hour(s)).       Radiology Studies: Ct Head Wo Contrast  Result Date: 01/07/2018 CLINICAL DATA:  Altered level of consciousness. EXAM: CT HEAD WITHOUT CONTRAST TECHNIQUE: Contiguous axial images were obtained from the base of the skull through the vertex without intravenous contrast. COMPARISON:  Brain MRI 12/30/2017.  Head CT 11/15/2017 FINDINGS: Brain: Generalized atrophy unchanged from prior exam. Cerebral and cerebellar chronic small vessel ischemia, also unchanged. No intracranial hemorrhage, mass effect, or midline shift. No hydrocephalus. The basilar cisterns are patent. No evidence of territorial infarct or acute ischemia. No extra-axial or intracranial fluid collection. Vascular: Atherosclerosis of skullbase vasculature without hyperdense vessel or abnormal calcification. Skull: No  fracture or focal lesion. Sinuses/Orbits: Mucosal thickening in the right maxillary sinus, chronic. No acute finding. Other: None. IMPRESSION: 1.  No acute intracranial abnormality. 2. Unchanged atrophy and chronic small vessel ischemia. Electronically Signed   By: Jeb Levering M.D.   On: 01/07/2018 23:13   Dg Chest Port 1 View  Result Date: 01/07/2018 CLINICAL DATA:  84 y/o M; increased confusion and weakness over a few months. EXAM: PORTABLE CHEST 1 VIEW COMPARISON:  12/30/2017 chest radiograph FINDINGS: Stable normal cardiac silhouette given projection and technique. Aortic atherosclerosis with calcification. Improved aeration of right middle lobe. No new focal consolidation. Emphysema. No pleural effusion or pneumothorax. No acute osseous abnormality is evident. IMPRESSION: Improved aeration in right middle lobe. No new focal consolidation. Aortic atherosclerosis. Stable cardiac silhouette. Electronically Signed   By: Kristine Garbe M.D.   On: 01/07/2018 16:57        Scheduled Meds: . amLODipine  5 mg Oral Daily  . aspirin EC  81 mg Oral Daily  . enoxaparin (LOVENOX) injection  40 mg Subcutaneous Daily  . insulin aspart  0-9 Units Subcutaneous TID WC  . insulin NPH Human  3 Units Subcutaneous QHS  . meclizine  12.5 mg Oral TID  . pravastatin  80 mg Oral Daily   Continuous Infusions: . sodium  chloride 75 mL/hr at 01/08/18 1127     LOS: 1 day    Time spent: 29mn    PDomenic Polite MD [facility] Page via [email or web address], password [email or web address] After 7PM please contact night-coverage  01/08/2018, 11:50 AM

## 2018-01-08 NOTE — Progress Notes (Signed)
Occupational Therapy Evaluation Patient Details Name: Chad Mcmillan MRN: [id number] DOB: July 02, 1924 Today's Date: 01/08/2018    History of Present Illness Patient is a 84 y/o male who presents with confusion, weakness and dizziness. Head CT-unremarkable. PMH includes HTN, DM, CAD, CVA.   Clinical Impression   PTA, pt lived at home with wife, who has dementia, and was apparently modified independent with ADL and mobility using a SPC or rollator. Pt has 2 supportive sons. Pt is adamant about returning to his home. Pt was able to mobilize with min guard A @ RW level, however demonstrates poor safety and judgement during tasks. Son states pt has suffered multiple falls. Pt is adamant about returning home. If eligible, feel pt would be appropriate for a home health program which provides necessary level of assistiance to facilitate independence and safety/reduce risk of falling. Will follow acutely to address established goals.     Follow Up Recommendations  Home health OT;Supervision/Assistance - 24 hour ; [place] social Teacher, music    Recommendations for Other Services       Precautions / Restrictions Precautions Precautions: Fall Precaution Comments: dizziness Restrictions Weight Bearing Restrictions: No      Mobility Bed Mobility               General bed mobility comments: on EOB with PT  Transfers Overall transfer level: Needs assistance Equipment used: Rolling walker (2 wheeled) Transfers: Sit to/from Stand Sit to Stand: Min guard         General transfer comment: multiple attempts; using momentum    Balance Overall balance assessment: Needs assistance;History of Falls   Sitting balance-Leahy Scale: Fair       Standing balance-Leahy Scale: Poor Standing balance comment: poor orientation of self in RW; does not correct with cues                           ADL either performed or assessed with clinical judgement    ADL Overall ADL's : Needs assistance/impaired     Grooming: Set up;Sitting   Upper Body Bathing: Set up;Sitting   Lower Body Bathing: Min guard;Sit to/from stand   Upper Body Dressing : Set up;Sitting   Lower Body Dressing: Min guard;Sit to/from stand   Toilet Transfer: Designer, television/film set Details (indicate cue type and reason): simulated. tooks multiple attmepts with pt using momentum to stand. Poor safety awareness Toileting- Clothing Manipulation and Hygiene: Min guard Toileting - Clothing Manipulation Details (indicate cue type and reason): Pt aware of brief falling down around his ankles     Functional mobility during ADLs: Min guard;Rolling walker;Cueing for safety;Cueing for sequencing General ADL Comments: Pt adamant about therapists not touching him despite having increased difficulty with sit - stand tranfsers     Vision         Perception     Praxis      Pertinent Vitals/Pain Pain Assessment: No/denies pain     Hand Dominance Right   Extremity/Trunk Assessment Upper Extremity Assessment Upper Extremity Assessment: Generalized weakness   Lower Extremity Assessment Lower Extremity Assessment: Defer to PT evaluation   Cervical / Trunk Assessment Cervical / Trunk Assessment: Kyphotic   Communication Communication Communication: HOH   Cognition Arousal/Alertness: Awake/alert Behavior During Therapy: Anxious Overall Cognitive Status: Impaired/Different from baseline Area of Impairment: Safety/judgement;Awareness;Attention;Problem solving                   Current  Attention Level: Selective     Safety/Judgement: Decreased awareness of safety;Decreased awareness of deficits Awareness: Emergent Problem Solving: Requires verbal cues;Slow processing General Comments: Reports being weak but still does not want any help/assist. Most likely returning to baseline. State he "doesn't need anyone but then agrees to home health once common  goal of keeping him independent, safe adn at home with his wife" was discussed   General Comments       Exercises     Shoulder Instructions      Home Living Family/patient expects to be discharged to:: Private residence Living Arrangements: Spouse/significant other Available Help at Discharge: Family;Available 24 hours/day Type of Home: House Home Access: Ramped entrance     Home Layout: One level     Bathroom Shower/Tub: Teacher, early years/pre: Standard Bathroom Accessibility: No   Home Equipment: Environmental consultant - 4 wheels;Cane - single point;Toilet riser          Prior Functioning/Environment Level of Independence: Needs assistance  Gait / Transfers Assistance Needed: Uses SPC for household ambulation intermittently; SON STATES HIS dAD "FURNITURE WALKS"just recently got a rollator. ADL's / Homemaking Assistance Needed: reports independence for ADLs. Wife does some IADLs. Son helps with grocery shopping and driving. sON STATES HIS DAD COMPLETES HIS OWN BATHING BUT EXPRESSES CONCERN FOR HIS ABILITY TO THOUROUGHLY CLEAN HIMSELF.            OT Problem List: Decreased strength;Decreased activity tolerance;Impaired balance (sitting and/or standing);Decreased safety awareness;Decreased knowledge of use of DME or AE      OT Treatment/Interventions: Self-care/ADL training;DME and/or AE instruction;Therapeutic activities;Patient/family education;Balance training;Cognitive remediation/compensation    OT Goals(Current goals can be found in the care plan section) Acute Rehab OT Goals Patient Stated Goal: to go home OT Goal Formulation: With patient/family Time For Goal Achievement: 01/22/18 Potential to Achieve Goals: Good  OT Frequency: Min 2X/week   Barriers to D/C:            Co-evaluation PT/OT/SLP Co-Evaluation/Treatment: Yes(PARTIAL SESSION) Reason for Co-Treatment: For patient/therapist safety PT goals addressed during session: Mobility/safety with  mobility;Balance;Strengthening/ROM;Proper use of DME OT goals addressed during session: ADL's and self-care      AM-PAC PT "6 Clicks" Daily Activity     Outcome Measure Help from another person eating meals?: None Help from another person taking care of personal grooming?: A Little Help from another person toileting, which includes using toliet, bedpan, or urinal?: A Little Help from another person bathing (including washing, rinsing, drying)?: A Little Help from another person to put on and taking off regular upper body clothing?: None Help from another person to put on and taking off regular lower body clothing?: A Little 6 Click Score: 20   End of Session Equipment Utilized During Treatment: Gait belt;Rolling walker Nurse Communication: Mobility status  Activity Tolerance: Patient tolerated treatment well Patient left: in chair;with call bell/phone within reach;with chair alarm set;with family/visitor present  OT Visit Diagnosis: Unsteadiness on feet (R26.81);Other abnormalities of gait and mobility (R26.89);History of falling (Z91.81);Muscle weakness (generalized) (M62.81)                Time: 1010-1034 OT Time Calculation (min): 24 min Charges:  OT General Charges $OT Visit: 1 Visit OT Evaluation $OT Eval Moderate Complexity: 1 Mod G-Codes:     [place], OT/L  [phone] 01/08/2018  Timoty Bourke,HILLARY 01/08/2018, 11:08 AM

## 2018-01-08 NOTE — Evaluation (Signed)
Physical Therapy Evaluation Patient Details Name: Chad Mcmillan MRN: [id number] DOB: [birth year] Today's Date: 01/08/2018   History of Present Illness  Patient is a 84 y/o male who presents with confusion, weakness and dizziness. Head CT-unremarkable. PMH includes HTN, DM, CAD, CVA.  Clinical Impression  Patient presents with generalized weakness, balance deficits, decreased safety awareness and impaired mobility s/p above. Pt lives with Chad Mcmillan who has dementia and has reported multipe falls at home recently. Pt is adamant about returning home despite this. Today, tolerated transfers and gait training with close Min guard assist with moments of Min A without UE support due to weakness and posterior bias in standing. See orthostatic BP and vitals below: Supine BP 138/56 HR 94 bpm Sitting BP 172/65 HR 100 bpm Standing BP 155/52 HR 113 bpm Sitting post activity 169/56 HR 129 bpm Pt with poor awareness of tolerance to activity and capabilities. Pt high fall risk. Also with vertigo which has been going on for a few weeks. Recommend vestibular assessment while in hospital to rule out any peripheral causes of this. Would benefit from [facility] First, if eligible to maximize independence and mobility and decrease fall risk at home. Will follow acutely.      Follow Up Recommendations Home health PT;Supervision for mobility/OOB;Supervision/Assistance - 24 hour    Equipment Recommendations  None recommended by PT    Recommendations for Other Services       Precautions / Restrictions Precautions Precautions: Fall Precaution Comments: dizziness Restrictions Weight Bearing Restrictions: No      Mobility  Bed Mobility Overal bed mobility: Needs Assistance Bed Mobility: Supine to Sit     Supine to sit: Min guard;HOB elevated     General bed mobility comments: Refused any assistance; increased time and effort, cues to use rail for support. No dizziness.   Transfers Overall transfer level: Needs  assistance Equipment used: Rolling walker (2 wheeled) Transfers: Sit to/from Stand Sit to Stand: Min guard;Min assist         General transfer comment: multiple attempts; using momentum and pt pulling up on RW for support. Min A once in standing without use of RW due to shakiness and trembling BLEs. Transferred to chair post ambulation.  Ambulation/Gait Ambulation/Gait assistance: Min guard Ambulation Distance (Feet): 100 Feet Assistive device: Rolling walker (2 wheeled) Gait Pattern/deviations: Step-through pattern;Decreased stride length;Trunk flexed;Wide base of support Gait velocity: decreased   General Gait Details: Slow, mildly unsteady gait with cues for RW proximity/management. Dizziness reported with turns. Veers right at times. HR up to 129 bpm. 2/4 DOE.   Stairs            Wheelchair Mobility    Modified Rankin (Stroke Patients Only)       Balance Overall balance assessment: Needs assistance;History of Falls Sitting-balance support: Feet supported;No upper extremity supported Sitting balance-Leahy Scale: Fair     Standing balance support: During functional activity;Bilateral upper extremity supported Standing balance-Leahy Scale: Poor Standing balance comment: Requires BUE support ins tanding otherwise requires Min A due to trembling of BLEs and posterior lean.                              Pertinent Vitals/Pain Pain Assessment: No/denies pain    Home Living Family/patient expects to be discharged to:: Private residence Living Arrangements: Spouse/significant other Available Help at Discharge: Family;Available 24 hours/day Type of Home: House Home Access: Ramped entrance     Home Layout: One level Home Equipment:  Walker - 4 wheels;Cane - single point;Toilet riser      Prior Function Level of Independence: Needs assistance   Gait / Transfers Assistance Needed: Uses SPC for household ambulation intermittently; Chad Mcmillan STATES HIS Chad Mcmillan  "FURNITURE WALKS"just recently got a rollator.  ADL's / Homemaking Assistance Needed: reports independence for ADLs. Chad Mcmillan does some IADLs. Chad Mcmillan helps with grocery shopping and driving. Chad Mcmillan STATES HIS Chad Mcmillan COMPLETES HIS OWN BATHING BUT EXPRESSES CONCERN FOR HIS ABILITY TO THOUROUGHLY CLEAN HIMSELF.        Hand Dominance   Dominant Hand: Right    Extremity/Trunk Assessment   Upper Extremity Assessment Upper Extremity Assessment: Defer to OT evaluation    Lower Extremity Assessment Lower Extremity Assessment: Generalized weakness    Cervical / Trunk Assessment Cervical / Trunk Assessment: Kyphotic  Communication   Communication: HOH  Cognition Arousal/Alertness: Awake/alert Behavior During Therapy: Anxious Overall Cognitive Status: Impaired/Different from baseline Area of Impairment: Safety/judgement;Awareness;Attention;Problem solving                   Current Attention Level: Selective     Safety/Judgement: Decreased awareness of safety;Decreased awareness of deficits Awareness: Emergent Problem Solving: Requires verbal cues;Slow processing General Comments: Reports being weak but still does not want any help/assist. Most likely returning to baseline. State he "doesn't need anyone but then agrees to home health once common goal of keeping him independent, safe adn at home with his Chad Mcmillan" was discussed      General Comments General comments (skin integrity, edema, etc.): Chad Mcmillan present in room and expressing deep concern about pt not having help at home and him having a functional decline lately. Bruising noted left flank from fall.     Exercises     Assessment/Plan    PT Assessment Patient needs continued PT services  PT Problem List Decreased mobility;Decreased strength;Decreased safety awareness;Decreased balance;Decreased knowledge of use of DME;Decreased activity tolerance;Cardiopulmonary status limiting activity;Decreased cognition       PT Treatment  Interventions Functional mobility training;Balance training;Patient/family education;Gait training;Therapeutic activities;Therapeutic exercise;Neuromuscular re-education;DME instruction;Cognitive remediation    PT Goals (Current goals can be found in the Care Plan section)  Acute Rehab PT Goals Patient Stated Goal: to go home PT Goal Formulation: With patient Time For Goal Achievement: 01/22/18 Potential to Achieve Goals: Fair    Frequency Min 3X/week   Barriers to discharge Decreased caregiver support lives with Chad Mcmillan that has dementia.    Co-evaluation PT/OT/SLP Co-Evaluation/Treatment: Yes Reason for Co-Treatment: For patient/therapist safety PT goals addressed during session: Mobility/safety with mobility;Balance;Strengthening/ROM;Proper use of DME OT goals addressed during session: ADL's and self-care       AM-PAC PT "6 Clicks" Daily Activity  Outcome Measure Difficulty turning over in bed (including adjusting bedclothes, sheets and blankets)?: None Difficulty moving from lying on back to sitting on the side of the bed? : None Difficulty sitting down on and standing up from a chair with arms (e.g., wheelchair, bedside commode, etc,.)?: A Little Help needed moving to and from a bed to chair (including a wheelchair)?: A Little Help needed walking in hospital room?: A Little Help needed climbing 3-5 steps with a railing? : A Lot 6 Click Score: 19    End of Session Equipment Utilized During Treatment: Gait belt Activity Tolerance: Treatment limited secondary to medical complications (Comment);Patient tolerated treatment well(elevated HR) Patient left: in chair;with call bell/phone within reach;with chair alarm set;with family/visitor present Nurse Communication: Mobility status PT Visit Diagnosis: Unsteadiness on feet (R26.81);Muscle weakness (generalized) (M62.81);Difficulty in walking, not elsewhere  classified (R26.2)    Time: 7867-5449 PT Time Calculation (min) (ACUTE  ONLY): 41 min   Charges:   PT Evaluation $PT Eval Moderate Complexity: 1 Mod PT Treatments $Gait Training: 8-22 mins   PT G Codes:        Wray Kearns, PT, DPT [phone]    Marguarite Arbour A Zohar Laing 01/08/2018, 11:58 AM

## 2018-01-09 LAB — BASIC METABOLIC PANEL
Anion gap: 11 (ref 5–15)
BUN: 46 mg/dL — AB (ref 6–20)
CO2: 19 mmol/L — ABNORMAL LOW (ref 22–32)
CREATININE: 2.2 mg/dL — AB (ref 0.61–1.24)
Calcium: 8.6 mg/dL — ABNORMAL LOW (ref 8.9–10.3)
Chloride: 110 mmol/L (ref 101–111)
GFR calc Af Amer: 28 mL/min — ABNORMAL LOW (ref >60)
GFR calc non Af Amer: 25 mL/min — ABNORMAL LOW (ref >60)
Glucose, Bld: 134 mg/dL — ABNORMAL HIGH (ref 65–99)
POTASSIUM: 4.1 mmol/L (ref 3.5–5.1)
Sodium: 140 mmol/L (ref 135–145)

## 2018-01-09 LAB — GLUCOSE, CAPILLARY
GLUCOSE-CAPILLARY: 111 mg/dL — AB (ref 65–99)
GLUCOSE-CAPILLARY: 166 mg/dL — AB (ref 65–99)

## 2018-01-09 LAB — FOLATE RBC
Folate, Hemolysate: 337.1 ng/mL
Folate, RBC: 989 ng/mL (ref >498)
Hematocrit: 34.1 % — ABNORMAL LOW (ref 37.5–51.0)

## 2018-01-09 MED ORDER — ENOXAPARIN SODIUM 30 MG/0.3ML ~~LOC~~ SOLN: 30.0000 mg | Freq: Every day | SUBCUTANEOUS | Status: DC

## 2018-01-09 MED ORDER — AMLODIPINE BESYLATE 5 MG PO TABS: 5.0000 mg | ORAL_TABLET | Freq: Every day | ORAL | 0 refills | Status: DC

## 2018-01-09 NOTE — Care Management Note (Signed)
Case Management Note  Patient Details  Name: Chad Mcmillan MRN: [id number] Date of Birth: 10-04-1924  Subjective/Objective:                    Action/Plan: Pt discharging home with [place] program and family. Cory with [facility] aware of d/c today.  Family to provide supervision at home and transportation home.  Expected Discharge Date:  01/09/18               Expected Discharge Plan:  [place]  In-House Referral:     Discharge planning Services  CM Consult  Post Acute Care Choice:  Home Health Choice offered to:  Patient, Adult Children  DME Arranged:    DME Agency:     HH Arranged:  RN, PT, OT HH Agency:  [facility](home first)  Status of Service:  Completed, signed off  If discussed at [place] of Stay Meetings, dates discussed:    Additional Comments:  Pollie Friar, RN 01/09/2018, 12:20 PM

## 2018-01-09 NOTE — Progress Notes (Signed)
Physical Therapy Treatment Patient Details Name: CHARBEL LOS MRN: [id number] DOB: 1924/01/18 Today's Date: 01/09/2018    History of Present Illness Patient is a 84 y/o male who presents with confusion, weakness and dizziness. Head CT-unremarkable. PMH includes HTN, DM, CAD, CVA.    PT Comments    Pt admitted with above diagnosis. Pt currently with functional limitations due to the deficits listed below (see PT Problem List). Pt was able to ambulate with min assist to min guard.  Pt does not want to be guarded but needs hands on therefore pt was irritated at times decreasing his safety.  Pt needs close min to min guard assist.  Agree with [facility] First program.  Attempts to check for vestibular issues made but unsure of definitive vertigo as pt a difficult historian.  Pt will benefit from skilled PT to increase their independence and safety with mobility to allow discharge to the venue listed below.     Follow Up Recommendations  Home health PT;Supervision for mobility/OOB;Supervision/Assistance - 24 hour     Equipment Recommendations  None recommended by PT    Recommendations for Other Services       Precautions / Restrictions Precautions Precautions: Fall Precaution Comments: dizziness Restrictions Weight Bearing Restrictions: No    Mobility  Bed Mobility Overal bed mobility: Needs Assistance Bed Mobility: Supine to Sit     Supine to sit: Min guard;HOB elevated     General bed mobility comments: Refused any assistance; increased time and effort  Transfers Overall transfer level: Needs assistance Equipment used: Rolling walker (2 wheeled) Transfers: Sit to/from Stand Sit to Stand: Min assist;Mod assist         General transfer comment: multiple attempts; using momentum and pt pulling up on RW for support. Min A once in standing without use of RW due to shakiness and trembling BLEs.   Ambulation/Gait Ambulation/Gait assistance: Min guard;Min assist;+2  safety/equipment Ambulation Distance (Feet): 150 Feet Assistive device: Rolling walker (2 wheeled) Gait Pattern/deviations: Step-through pattern;Decreased stride length;Trunk flexed;Wide base of support Gait velocity: decreased Gait velocity interpretation: Below normal speed for age/gender General Gait Details: Once pt up on feet, needed to side step to begin walking.  Pt getting upset because he did not want PT to hold onto gait belt.  PT explained that need to guard pt. for safety however pt kept getting irritated and was not listening to cuing to stay close to RW and how to initiate steps.  Finally was able to get pt to walk around bed with light hands on pt for safety.  Slow, mildly unsteady gait with cues for RW proximity/management.  Veers right at times.  Pt continued to argue with PT about holding onto him and PT adjusted hands on keeping pt as safe as allowed.     Stairs            Wheelchair Mobility    Modified Rankin (Stroke Patients Only)       Balance Overall balance assessment: Needs assistance;History of Falls Sitting-balance support: Feet supported;No upper extremity supported Sitting balance-Leahy Scale: Fair     Standing balance support: During functional activity;Bilateral upper extremity supported Standing balance-Leahy Scale: Poor Standing balance comment: Requires BUE support in  standing otherwise requires Min A due to trembling of BLEs and posterior lean.                             Cognition Arousal/Alertness: Awake/alert Behavior During Therapy: Anxious Overall  Cognitive Status: Impaired/Different from baseline Area of Impairment: Safety/judgement;Awareness;Attention;Problem solving                   Current Attention Level: Selective     Safety/Judgement: Decreased awareness of safety;Decreased awareness of deficits Awareness: Emergent Problem Solving: Requires verbal cues;Slow processing        Exercises Other  Exercises Other Exercises: Educated in x1 exercises and asked pt to practice 3x day    General Comments General comments (skin integrity, edema, etc.): Son present and trying to calm pt when pt was getting irritated. Performed canalith repositioning for right BPPV based on pt report of symptoms.  Also did appear to have some issues with gaze stability therefore educated pt regarding x 1 exercises.      Pertinent Vitals/Pain Pain Assessment: No/denies pain    Home Living                      Prior Function            PT Goals (current goals can now be found in the care plan section) Acute Rehab PT Goals Patient Stated Goal: to go home Progress towards PT goals: Progressing toward goals    Frequency    Min 3X/week      PT Plan Current plan remains appropriate    Co-evaluation              AM-PAC PT "6 Clicks" Daily Activity  Outcome Measure  Difficulty turning over in bed (including adjusting bedclothes, sheets and blankets)?: None Difficulty moving from lying on back to sitting on the side of the bed? : None Difficulty sitting down on and standing up from a chair with arms (e.g., wheelchair, bedside commode, etc,.)?: A Little Help needed moving to and from a bed to chair (including a wheelchair)?: A Little Help needed walking in hospital room?: A Little Help needed climbing 3-5 steps with a railing? : A Lot 6 Click Score: 19    End of Session Equipment Utilized During Treatment: Gait belt Activity Tolerance: Patient tolerated treatment well Patient left: in chair;with call bell/phone within reach;with chair alarm set;with family/visitor present Nurse Communication: Mobility status PT Visit Diagnosis: Unsteadiness on feet (R26.81);Muscle weakness (generalized) (M62.81);Difficulty in walking, not elsewhere classified (R26.2)     Time: 8280-0349 PT Time Calculation (min) (ACUTE ONLY): 41 min  Charges:  $Gait Training: 8-22 mins $Therapeutic  Exercise: 8-22 mins $Therapeutic Activity: 8-22 mins                    G Codes:       Viola Placeres,PT Acute Rehabilitation [phone] (pager)    Denice Paradise 01/09/2018, 12:54 PM

## 2018-01-09 NOTE — Consult Note (Addendum)
   [facility] CM Inpatient Consult   01/09/2018  Chad Mcmillan 09/27/1924 [id number]    Made aware by Tommi Rumps with [facility] that Chad Mcmillan will enroll in the [facility] First program.  Martin Majestic to bedside prior to hospital discharge. Explained to patient and son, Chad Mcmillan, that [place] Management will assist if transportation or medication needs are identified while on the [place] program.  Also explained that [place] Management could potentially follow at later time, if community case management needs are identified, after Mr. Common has completed [place] program.  Both patient and son Chad Mcmillan) are agreeable to this and [facility] Care Management consent obtained. [facility] Care Management folder provided as well.   Notification sent to [place] Management team.    Chad Mcmillan, [place], RN,BSN [facility] Liaison [phone]

## 2018-01-09 NOTE — Progress Notes (Signed)
Pt d/c home with home health. Pt is stable no new concerns, d/c instructions done, with teach back, pt verbalize understanding.

## 2018-01-12 ENCOUNTER — Emergency Department (HOSPITAL_COMMUNITY): Payer: Medicare Other

## 2018-01-12 ENCOUNTER — Inpatient Hospital Stay (HOSPITAL_COMMUNITY)
Admission: EM | Admit: 2018-01-12 | Discharge: 2018-01-16 | DRG: 193 | Disposition: A | Discharge status: Skilled Nursing Facility | Payer: Medicare Other | Attending: Internal Medicine | Admitting: Internal Medicine

## 2018-01-12 ENCOUNTER — Other Ambulatory Visit: Payer: Self-pay

## 2018-01-12 ENCOUNTER — Encounter (HOSPITAL_COMMUNITY): Payer: Self-pay

## 2018-01-12 DIAGNOSIS — Z515 Encounter for palliative care: Secondary | ICD-10-CM | POA: Diagnosis not present

## 2018-01-12 DIAGNOSIS — G9340 Encephalopathy, unspecified: Secondary | ICD-10-CM | POA: Diagnosis not present

## 2018-01-12 DIAGNOSIS — Z8546 Personal history of malignant neoplasm of prostate: Secondary | ICD-10-CM | POA: Diagnosis not present

## 2018-01-12 DIAGNOSIS — N179 Acute kidney failure, unspecified: Secondary | ICD-10-CM | POA: Diagnosis present

## 2018-01-12 DIAGNOSIS — F0391 Unspecified dementia with behavioral disturbance: Secondary | ICD-10-CM | POA: Diagnosis present

## 2018-01-12 DIAGNOSIS — K7581 Nonalcoholic steatohepatitis (NASH): Secondary | ICD-10-CM | POA: Diagnosis present

## 2018-01-12 DIAGNOSIS — N183 Chronic kidney disease, stage 3 (moderate): Secondary | ICD-10-CM | POA: Diagnosis present

## 2018-01-12 DIAGNOSIS — E1122 Type 2 diabetes mellitus with diabetic chronic kidney disease: Secondary | ICD-10-CM | POA: Diagnosis not present

## 2018-01-12 DIAGNOSIS — I2510 Atherosclerotic heart disease of native coronary artery without angina pectoris: Secondary | ICD-10-CM | POA: Diagnosis present

## 2018-01-12 DIAGNOSIS — G929 Unspecified toxic encephalopathy: Secondary | ICD-10-CM | POA: Diagnosis present

## 2018-01-12 DIAGNOSIS — Z7189 Other specified counseling: Secondary | ICD-10-CM

## 2018-01-12 DIAGNOSIS — E10319 Type 1 diabetes mellitus with unspecified diabetic retinopathy without macular edema: Secondary | ICD-10-CM | POA: Diagnosis present

## 2018-01-12 DIAGNOSIS — G92 Toxic encephalopathy: Secondary | ICD-10-CM | POA: Diagnosis present

## 2018-01-12 DIAGNOSIS — J189 Pneumonia, unspecified organism: Secondary | ICD-10-CM | POA: Diagnosis present

## 2018-01-12 DIAGNOSIS — N5082 Scrotal pain: Secondary | ICD-10-CM | POA: Diagnosis present

## 2018-01-12 DIAGNOSIS — Y95 Nosocomial condition: Secondary | ICD-10-CM | POA: Diagnosis present

## 2018-01-12 DIAGNOSIS — Z87891 Personal history of nicotine dependence: Secondary | ICD-10-CM | POA: Diagnosis not present

## 2018-01-12 DIAGNOSIS — N189 Chronic kidney disease, unspecified: Secondary | ICD-10-CM | POA: Diagnosis not present

## 2018-01-12 DIAGNOSIS — F0390 Unspecified dementia without behavioral disturbance: Secondary | ICD-10-CM

## 2018-01-12 DIAGNOSIS — Z794 Long term (current) use of insulin: Secondary | ICD-10-CM | POA: Diagnosis not present

## 2018-01-12 DIAGNOSIS — R451 Restlessness and agitation: Secondary | ICD-10-CM

## 2018-01-12 DIAGNOSIS — E1022 Type 1 diabetes mellitus with diabetic chronic kidney disease: Secondary | ICD-10-CM | POA: Diagnosis present

## 2018-01-12 DIAGNOSIS — K4090 Unilateral inguinal hernia, without obstruction or gangrene, not specified as recurrent: Secondary | ICD-10-CM

## 2018-01-12 DIAGNOSIS — E860 Dehydration: Secondary | ICD-10-CM | POA: Diagnosis present

## 2018-01-12 DIAGNOSIS — E785 Hyperlipidemia, unspecified: Secondary | ICD-10-CM | POA: Diagnosis present

## 2018-01-12 DIAGNOSIS — Z8673 Personal history of transient ischemic attack (TIA), and cerebral infarction without residual deficits: Secondary | ICD-10-CM

## 2018-01-12 DIAGNOSIS — R296 Repeated falls: Secondary | ICD-10-CM | POA: Diagnosis present

## 2018-01-12 DIAGNOSIS — I129 Hypertensive chronic kidney disease with stage 1 through stage 4 chronic kidney disease, or unspecified chronic kidney disease: Secondary | ICD-10-CM | POA: Diagnosis present

## 2018-01-12 DIAGNOSIS — Z7982 Long term (current) use of aspirin: Secondary | ICD-10-CM

## 2018-01-12 DIAGNOSIS — N184 Chronic kidney disease, stage 4 (severe): Secondary | ICD-10-CM

## 2018-01-12 LAB — CBC WITH DIFFERENTIAL/PLATELET
BASOS ABS: 0 10*3/uL (ref 0.0–0.1)
BASOS PCT: 0 %
EOS ABS: 0 10*3/uL (ref 0.0–0.7)
Eosinophils Relative: 0 %
HEMATOCRIT: 32.8 % — AB (ref 39.0–52.0)
HEMOGLOBIN: 10.9 g/dL — AB (ref 13.0–17.0)
LYMPHS PCT: 14 %
Lymphs Abs: 1 10*3/uL (ref 0.7–4.0)
MCH: 32.1 pg (ref 26.0–34.0)
MCHC: 33.2 g/dL (ref 30.0–36.0)
MCV: 96.5 fL (ref 78.0–100.0)
Monocytes Absolute: 0.6 10*3/uL (ref 0.1–1.0)
Monocytes Relative: 8 %
NEUTROS PCT: 78 %
Neutro Abs: 5.3 10*3/uL (ref 1.7–7.7)
Platelets: 116 10*3/uL — ABNORMAL LOW (ref 150–400)
RBC: 3.4 MIL/uL — AB (ref 4.22–5.81)
RDW: 13.4 % (ref 11.5–15.5)
WBC: 6.9 10*3/uL (ref 4.0–10.5)

## 2018-01-12 LAB — URINALYSIS, ROUTINE W REFLEX MICROSCOPIC
Bilirubin Urine: NEGATIVE
Glucose, UA: 150 mg/dL — AB
KETONES UR: NEGATIVE mg/dL
Leukocytes, UA: NEGATIVE
Nitrite: NEGATIVE
PH: 5 (ref 5.0–8.0)
Protein, ur: 100 mg/dL — AB
SPECIFIC GRAVITY, URINE: 1.012 (ref 1.005–1.030)

## 2018-01-12 LAB — COMPREHENSIVE METABOLIC PANEL
ALBUMIN: 3.4 g/dL — AB (ref 3.5–5.0)
ALK PHOS: 103 U/L (ref 38–126)
ALT: 27 U/L (ref 17–63)
AST: 52 U/L — AB (ref 15–41)
Anion gap: 13 (ref 5–15)
BUN: 61 mg/dL — ABNORMAL HIGH (ref 6–20)
CO2: 17 mmol/L — ABNORMAL LOW (ref 22–32)
Calcium: 8.8 mg/dL — ABNORMAL LOW (ref 8.9–10.3)
Chloride: 109 mmol/L (ref 101–111)
Creatinine, Ser: 2.74 mg/dL — ABNORMAL HIGH (ref 0.61–1.24)
GFR calc Af Amer: 22 mL/min — ABNORMAL LOW (ref >60)
GFR calc non Af Amer: 19 mL/min — ABNORMAL LOW (ref >60)
GLUCOSE: 264 mg/dL — AB (ref 65–99)
Potassium: 5.1 mmol/L (ref 3.5–5.1)
Sodium: 139 mmol/L (ref 135–145)
TOTAL PROTEIN: 6.4 g/dL — AB (ref 6.5–8.1)
Total Bilirubin: 1.1 mg/dL (ref 0.3–1.2)

## 2018-01-12 LAB — RAPID URINE DRUG SCREEN, HOSP PERFORMED
Amphetamines: NOT DETECTED
Barbiturates: NOT DETECTED
Benzodiazepines: POSITIVE — AB
COCAINE: NOT DETECTED
OPIATES: NOT DETECTED
TETRAHYDROCANNABINOL: NOT DETECTED

## 2018-01-12 LAB — AMMONIA: Ammonia: 11 umol/L (ref 9–35)

## 2018-01-12 LAB — ETHANOL

## 2018-01-12 LAB — CREATININE, URINE, RANDOM: CREATININE, URINE: 35.29 mg/dL

## 2018-01-12 LAB — SODIUM, URINE, RANDOM: Sodium, Ur: 124 mmol/L

## 2018-01-12 MED ORDER — SODIUM CHLORIDE 0.9 % IV SOLN
1.0000 g | INTRAVENOUS | Status: DC
  Administered 2018-01-13: 1 g via INTRAVENOUS
  Filled 2018-01-12: qty 1

## 2018-01-12 MED ORDER — VANCOMYCIN HCL IN DEXTROSE 1-5 GM/200ML-% IV SOLN
1000.0000 mg | Freq: Once | INTRAVENOUS | Status: AC
  Administered 2018-01-12: 1000 mg via INTRAVENOUS
  Filled 2018-01-12: qty 200

## 2018-01-12 MED ORDER — SODIUM CHLORIDE 0.9 % IV BOLUS (SEPSIS)
500.0000 mL | Freq: Once | INTRAVENOUS | Status: AC
  Administered 2018-01-12: 500 mL via INTRAVENOUS

## 2018-01-12 MED ORDER — HALOPERIDOL LACTATE 5 MG/ML IJ SOLN
2.0000 mg | Freq: Once | INTRAMUSCULAR | Status: AC
  Administered 2018-01-12: 2 mg via INTRAVENOUS
  Filled 2018-01-12: qty 1

## 2018-01-12 MED ORDER — SODIUM CHLORIDE 0.9 % IV SOLN
2.0000 g | Freq: Once | INTRAVENOUS | Status: AC
  Administered 2018-01-12: 2 g via INTRAVENOUS
  Filled 2018-01-12: qty 2

## 2018-01-12 MED ORDER — VANCOMYCIN HCL IN DEXTROSE 1-5 GM/200ML-% IV SOLN
1000.0000 mg | INTRAVENOUS | Status: DC
  Filled 2018-01-12: qty 200

## 2018-01-12 MED ORDER — LORAZEPAM 2 MG/ML IJ SOLN
1.0000 mg | Freq: Once | INTRAMUSCULAR | Status: AC
  Administered 2018-01-12: 1 mg via INTRAVENOUS
  Filled 2018-01-12: qty 1

## 2018-01-12 MED ORDER — SODIUM CHLORIDE 0.9 % IV SOLN
Freq: Once | INTRAVENOUS | Status: AC
  Administered 2018-01-12: 18:00:00 via INTRAVENOUS

## 2018-01-12 NOTE — ED Notes (Signed)
Family  Standing in the hallway. [employer] brought IVc paperwork. Patient continues to yell and scream

## 2018-01-12 NOTE — Progress Notes (Signed)
Pharmacy Antibiotic Note  Chad Mcmillan is a 84 y.o. male admitted on 01/12/2018 with pneumonia.  Pharmacy has been consulted for vancomycin dosing.  Today, 01/12/2018  CKD - SCr may be mildly elevated above baseline  Plan:  Vancomycin 1gm IV q48h - next dose in am   Cefepime adjusted to 1gm IV q24h for renal function  Daily SCr  Check vancomycin levels as indicated  Suggest checking MRSA PCR  Height: 6' 1"  (185.4 cm) Weight: 179 lb (81.2 kg) IBW/kg (Calculated) : 79.9  Temp (24hrs), Avg:98.5 F (36.9 C), Min:98.5 F (36.9 C), Max:98.5 F (36.9 C)  Recent Labs  Lab 01/07/18 1551 01/09/18 0724 01/12/18 1248  WBC 6.4  --  6.9  CREATININE 2.68* 2.20* 2.74*    Estimated Creatinine Clearance: 19.8 mL/min (A) (by C-G formula based on SCr of 2.74 mg/dL (H)).    No Known Allergies  Antimicrobials this admission: 2/25 vanco 2/25 cefepime   Dose adjustments this admission:  Microbiology results: 2/25 BCx:  2/25 strep Ag:  Sputum: ordered  Thank you for allowing pharmacy to be a part of this patient's care.  Doreene Eland, PharmD, BCPS.   Pager: [phone] 01/12/2018 11:07 PM

## 2018-01-12 NOTE — Care Management Note (Signed)
Case Management Note  CM noted pt was active with [facility] as of 01/09/2018.  Contacted Georgina Snell with [facility] who advised the pt was very upset and belligerent with the [facility] RN on 01/10/2018 and refused all services while at the pt's home.  Pt currently has no HH due to refusal.

## 2018-01-12 NOTE — ED Notes (Signed)
Bed: WA02 Expected date:  Expected time:  Means of arrival:  Comments: EMS-84 y/o

## 2018-01-12 NOTE — ED Provider Notes (Signed)
[facility] DEPT Provider Note   CSN: [id number] Arrival date & time: 01/12/18  1122     History   Chief Complaint Chief Complaint  Patient presents with  . Dementia  . beligerent    HPI Chad Mcmillan is a 84 y.o. male.  HPI Patient lives with his elderly wife.  IVC paperwork was taken out by family for increasing confusion and agitation.  Patient is screaming out and not able to provide coherent history.  Level 5 caveat applies. Past Medical History:  Diagnosis Date  . ADENOCARCINOMA, PROSTATE 06/01/2008  . CORONARY ARTERY DISEASE 06/13/2007  . CVA 06/01/2008   x 2  . DIABETES MELLITUS, TYPE I, WITH OPHTHALMIC COMPLICATIONS 3/82/5053  . 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 HYPERKALEMIA 06/01/2008   due to DM neparopathy  . HYPERLIPIDEMIA 06/13/2007  . HYPERTENSION 06/13/2007  . NEOPLASM, MALIGNANT, SKIN, FACE 06/01/2008  . Other chronic nonalcoholic liver disease 9/76/7341  . RENAL DISEASE 06/01/2008    Patient Active Problem List   Diagnosis Date Noted  . Acute renal failure superimposed on stage 3 chronic kidney disease ([address]) 01/07/2018  . Vertigo 01/07/2018  . LBBB (left bundle branch block) 01/07/2018  . Generalized weakness 01/07/2018  . Acute metabolic encephalopathy 93/79/0240  . Dehydration   . Foot pain, bilateral 10/07/2017  . Memory loss 04/07/2017  . Numbness 03/06/2016  . Dizzy 01/31/2016  . Medication side effect 03/08/2015  . Pancytopenia ([place]) 07/27/2012  . Screening for prostate cancer 07/27/2012  . DM (diabetes mellitus) ([place]) 07/27/2012  . Anemia 07/27/2012  . Encounter for long-term (current) use of other medications 07/23/2011  . NASH (nonalcoholic steatohepatitis) 07/17/2010  . COUGH 02/06/2010  . PNEUMONIA, ORGANISM UNSPECIFIED 11/27/2009  . NEOPLASM, MALIGNANT, SKIN, FACE 06/01/2008  . ADENOCARCINOMA, PROSTATE 06/01/2008  . HYPERKALEMIA 06/01/2008  . Stroke (cerebrum) ([place]) 06/01/2008  . Disorder of kidney and ureter  06/01/2008  . NEOPLASM, MALIGNANT, SKIN, FACE 06/01/2008  . Dyslipidemia 06/13/2007  . Essential hypertension 06/13/2007  . Coronary atherosclerosis 06/13/2007    Past Surgical History:  Procedure Laterality Date  . Carotid Duplex  09/12/2004  . CATARACT EXTRACTION    . CHOLECYSTECTOMY    . ESOPHAGOGASTRODUODENOSCOPY  06/28/1991  . HERNIA REPAIR     right Inguinal hernia  . Rest/Stress Cardiolite  01/22/2001       Home Medications    Prior to Admission medications   Medication Sig Start Date End Date Taking? Authorizing Provider  Acetaminophen 500 MG coapsule Take 500-1,000 mg by mouth every 6 (six) hours as needed for headache (pain).   Yes [provider]  amLODipine (NORVASC) 5 MG tablet Take 1 tablet (5 mg total) by mouth daily. 01/09/18  Yes Domenic Polite, MD  aspirin EC 81 MG tablet Take 1 tablet (81 mg total) by mouth daily. 08/04/17  Yes [name], MD  insulin lispro (HUMALOG) 100 UNIT/ML injection 3 times a day (just before each meal), 15-10-10 units Patient taking differently: Inject 3-25 Units into the skin once. 3 times a day (just before each meal), 15-10-10 units 10/07/17  Yes [name], MD  insulin NPH Human (HUMULIN N) 100 UNIT/ML injection Inject 0.05 mLs (5 Units total) into the skin at bedtime. 07/07/17  Yes [name], MD  meclizine (ANTIVERT) 12.5 MG tablet Take 1 tablet (12.5 mg total) by mouth 3 (three) times daily as needed for dizziness. Patient taking differently: Take 12.5 mg by mouth 3 (three) times daily.  11/20/17  Yes [name],  MD  pravastatin (PRAVACHOL) 80 MG tablet TAKE 1 TABLET BY MOUTH ONCE DAILY Patient taking differently: TAKE 80 mg  TABLET BY MOUTH ONCE DAILY 11/24/17  Yes [name], MD  nitroGLYCERIN (NITROSTAT) 0.4 MG SL tablet Place 1 tablet (0.4 mg total) under the tongue every 5 (five) minutes as needed. For chest pain 08/04/17   [name], MD    Family History History reviewed. No pertinent family  history.  Social History Social History   Tobacco Use  . Smoking status: Former Smoker    Last attempt to quit: 11/18/1978    Years since quitting: 39.1  . Smokeless tobacco: Never Used  Substance Use Topics  . Alcohol use: No  . Drug use: No     Allergies   Patient has no known allergies.   Review of Systems Review of Systems  Unable to perform ROS: Dementia     Physical Exam Updated Vital Signs BP (!) 155/61   Pulse 80   Temp 98.5 F (36.9 C) (Oral)   Resp 18   Ht 6\' 1"  (1.854 m)   Wt 81.2 kg (179 lb)   SpO2 96%   BMI 23.62 kg/m   Physical Exam  Constitutional: He is oriented to person, place, and time. He appears well-developed and well-nourished.  Disheveled and agitated  HENT:  Head: Normocephalic and atraumatic.  No obvious head injury.  Mucous membranes are dry.  Eyes: EOM are normal. Pupils are equal, round, and reactive to light.  Neck: Normal range of motion. Neck supple.  No meningismus  Cardiovascular: Regular rhythm.  Tachycardia.  Pulmonary/Chest: Effort normal and breath sounds normal.  Abdominal: Soft. Bowel sounds are normal. There is no tenderness. There is no rebound and no guarding.  Patient has bruising to the right upper abdominal wall.  No apparent tenderness to palpation.  Genitourinary:  Genitourinary Comments: Large right inguinal hernia that is reducible.    Musculoskeletal: Normal range of motion. He exhibits no edema or tenderness.  Neurological: He is alert and oriented to person, place, and time.  Moves all extremities without deficit.  Sensation intact.  Not following commands.  Combative  Skin: Skin is warm and dry. No rash noted. No erythema.  Nursing note and vitals reviewed.    ED Treatments / Results  Labs (all labs ordered are listed, but only abnormal results are displayed) Labs Reviewed  CBC WITH DIFFERENTIAL/PLATELET - Abnormal; Notable for the following components:      Result Value   RBC 3.40 (*)     Hemoglobin 10.9 (*)    HCT 32.8 (*)    Platelets 116 (*)    All other components within normal limits  COMPREHENSIVE METABOLIC PANEL - Abnormal; Notable for the following components:   CO2 17 (*)    Glucose, Bld 264 (*)    BUN 61 (*)    Creatinine, Ser 2.74 (*)    Calcium 8.8 (*)    Total Protein 6.4 (*)    Albumin 3.4 (*)    AST 52 (*)    GFR calc non Af Amer 19 (*)    GFR calc Af Amer 22 (*)    All other components within normal limits  ETHANOL  URINALYSIS, ROUTINE W REFLEX MICROSCOPIC  RAPID URINE DRUG SCREEN, HOSP PERFORMED    EKG  EKG Interpretation None       Radiology No results found.  Procedures Procedures (including critical care time)  Medications Ordered in ED Medications  LORazepam (ATIVAN) injection 1 mg (  1 mg Intravenous Given 01/12/18 1300)  sodium chloride 0.9 % bolus 500 mL (500 mLs Intravenous New Bag/Given 01/12/18 1402)     Initial Impression / Assessment and Plan / ED Course  I have reviewed the triage vital signs and the nursing notes.  Pertinent labs & imaging results that were available during my care of the patient were reviewed by me and considered in my medical decision making (see chart for details).     Given a milligram of Ativan and reevaluated.  Patient now is much more calm. Family has been updated.  Awaiting UA for medical clearance for psychiatric evaluation. Final Clinical Impressions(s) / ED Diagnoses   Final diagnoses:  Agitation  Inguinal hernia of right side without obstruction or gangrene    ED Discharge Orders    None       Julianne Rice, MD 01/12/18 1550

## 2018-01-12 NOTE — ED Notes (Signed)
Patient is very beligernt and uncooperative. Patient allowed writer to take his pants off. Patient is incontinent of urine, but is not allowing thenurse to remove the underwear. Patient continues to say he is having pain in the scrotum as he is grabbing the scrotum.

## 2018-01-12 NOTE — ED Triage Notes (Signed)
Patient reports pain at his scrotum. Patient continues to grab his scrotum and states he needs to be checked

## 2018-01-12 NOTE — ED Triage Notes (Signed)
Per EMS- patient is from home. Patient's family reports that the patient was recently diagnosed with dementia and has been been beligerent at home. EMS reports the same behavior with them. Patient was given Versed 2.5 mg IV prior to arrival to the ED. EMS reports that the family is obtained IVC paperwork at this time.

## 2018-01-12 NOTE — ED Provider Notes (Addendum)
Assumed care from Dr. Lita Mains at 5:16 PM. Briefly, the patient is a 84 y.o. 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 here with increasing agitation, confusion. Concern for delirium versus worsening dementia now w/ behavioral issues. Labwork c/w mild dehydration, for which pt has been given IVF. UA pending. Plan for med admission for delirium versus psych consult.   Labs Reviewed  CBC WITH DIFFERENTIAL/PLATELET - Abnormal; Notable for the following components:      Result Value   RBC 3.40 (*)    Hemoglobin 10.9 (*)    HCT 32.8 (*)    Platelets 116 (*)    All other components within normal limits  COMPREHENSIVE METABOLIC PANEL - Abnormal; Notable for the following components:   CO2 17 (*)    Glucose, Bld 264 (*)    BUN 61 (*)    Creatinine, Ser 2.74 (*)    Calcium 8.8 (*)    Total Protein 6.4 (*)    Albumin 3.4 (*)    AST 52 (*)    GFR calc non Af Amer 19 (*)    GFR calc Af Amer 22 (*)    All other components within normal limits  URINALYSIS, ROUTINE W REFLEX MICROSCOPIC - Abnormal; Notable for the following components:   Glucose, UA 150 (*)    Hgb urine dipstick MODERATE (*)    Protein, ur 100 (*)    Bacteria, UA RARE (*)    Squamous Epithelial / LPF 0-5 (*)    All other components within normal limits  RAPID URINE DRUG SCREEN, HOSP PERFORMED - Abnormal; Notable for the following components:   Benzodiazepines POSITIVE (*)    All other components within normal limits  CULTURE, BLOOD (ROUTINE X 2)  CULTURE, BLOOD (ROUTINE X 2)  ETHANOL    Course of Care: -Chest x-ray obtained by myself  in the setting of recent diagnosis of pneumonia.  Chest x-ray shows worsening infiltrate concerning for possible ongoing pneumonia.  The patient does not recently treated for this during his admission, but had previously been on antibiotics several weeks ago.  CMP shows recurrent AK I.  Given his ongoing delirium, concerned that this is likely related to his worsening AK I as well as pneumonia.  Family does note decreased appetite.  Plan to readmit.  Addendum: After his Ativan has worn off, patient agitated and complaining of pain at his inguinal hernia.  He does have a very large indirect versus possibly direct inguinal hernia.  I was able to easily reduce this, though he does have significant tissue defect there with easy recurrence of it.  He has had no vomiting, no abdominal distention. Hospitalist aware, and based on discussion with obtain CT for further eval.   Duffy Bruce, MD 01/12/18 1716    Duffy Bruce, MD 01/12/18 5945    Duffy Bruce, MD 01/12/18 2036

## 2018-01-12 NOTE — H&P (Signed)
Chad Mcmillan:[id number] DOB: May 25, 1924 DOA: 01/12/2018     PCP: [name], MD   Outpatient Specialists: Otilio Saber Patient coming from:   home Lives   With family    Chief Complaint: Confusion  HPI: Chad Mcmillan is a 84 y.o. male with medical history significant of dementia, prostate cancer, CAD, CVA, DM 2, HTN, HLD, CKD, and ASH, LBBB    Presented with worsening confusion Patient has been admitted for the same on 20 February at that time it seems that patient has been declining mentally since June and has been progressively getting worse for the past few months.  He has trouble with balance and significant agitation has repeated falls.  Patient unable to provide his own history but family denies patient reporting cough or shortness of breath fevers otherwise no localizing neurological changes recent MRI of the brain was was done and unremarkable.  He was found to have mild AK I discharged to home with a diagnosis of failure to thrive patient has had decreased p.o. intake  In route to ER patient was given Versed by EMS family obtain IVC paperwork secondary to patient being severely belligerent to the family While in ER: Patient initially was complaining about pain to the scrotum. He was given 1 mg of Ativan and been more stable  Right inguinal hernia was reduced in ER with improvement in pain Significant initial  Findings: WBC 6.9 hemoglobin 10.9 which is around baseline Sodium 139K5.1 creatinine 2.74 which is up from 2.2 at the time of discharge CT head unremarkable Chest x-ray showing worsening infiltrate concerning for possible pneumonia:   He was started on cefepime and vancomycin per pharmacy for possible H CAP  CT abd showing no evidence of incarceration  IN ER:  Temp (24hrs), Avg:98.5 F (36.9 C), Min:98.5 F (36.9 C), Max:98.5 F (36.9 C)      on arrival  ED Triage Vitals  Enc Vitals Group     BP 01/12/18 1136 (!) 160/62     Pulse Rate 01/12/18 1136 87     Resp 01/12/18 1136 18     Temp 01/12/18 1136 98.5 F (36.9 C)     Temp Source 01/12/18 1136 Oral     SpO2 01/12/18 1136 100 %     Weight 01/12/18 1153 179 lb (81.2 kg)     Height 01/12/18 1153 6' 1"  (1.854 m)     Head Circumference --      Peak Flow --      Pain Score --      Pain Loc --      Pain Edu? --      Excl. in [place]? --     Latest RR 18 satting 98% HR 81 BP 136/93   Following Medications were ordered in ER: Medications  vancomycin (VANCOCIN) IVPB 1000 mg/200 mL premix (1,000 mg Intravenous New Bag/Given 01/12/18 1859)  LORazepam (ATIVAN) injection 1 mg (1 mg Intravenous Given 01/12/18 1300)  sodium chloride 0.9 % bolus 500 mL (0 mLs Intravenous Stopped 01/12/18 1558)  0.9 %  sodium chloride infusion ( Intravenous New Bag/Given 01/12/18 1823)  ceFEPIme (MAXIPIME) 2 g in sodium chloride 0.9 % 100 mL IVPB (0 g Intravenous Stopped 01/12/18 1852)      Hospitalist was called for admission for H CAP associated with acute delirium in the setting of dementia  Regarding pertinent Chronic problems: Diabetes on insulin  Review of Systems:    Pertinent positives include: Worsening confusion  Constitutional:  No  weight loss, night sweats, Fevers, chills, fatigue, weight loss  HEENT:  No headaches, Difficulty swallowing,Tooth/dental problems,Sore throat,  No sneezing, itching, ear ache, nasal congestion, post nasal drip,  Cardio-vascular:  No chest pain, Orthopnea, PND, anasarca, dizziness, palpitations.no Bilateral lower extremity swelling  GI:  No heartburn, indigestion, abdominal pain, nausea, vomiting, diarrhea, change in bowel habits, loss of appetite, melena, blood in stool, hematemesis Resp:  no shortness of breath at rest. No dyspnea on exertion, No excess mucus, no productive cough, No non-productive cough, No coughing up of blood.No change in color of mucus.No wheezing. Skin:  no rash or lesions. No jaundice GU:  no dysuria, change in color of urine, no urgency or  frequency. No straining to urinate.  No flank pain.  Musculoskeletal:  No joint pain or no joint swelling. No decreased range of motion. No back pain.  Psych:  No change in mood or affect. No depression or anxiety. No memory loss.  Neuro: no localizing neurological complaints, no tingling, no weakness, no double vision, no gait abnormality, no slurred speech, no confusion  As per HPI otherwise 10 point review of systems negative.   Past Medical History: Past Medical History:  Diagnosis Date  . ADENOCARCINOMA, PROSTATE 06/01/2008  . CORONARY ARTERY DISEASE 06/13/2007  . CVA 06/01/2008   x 2  . DIABETES MELLITUS, TYPE I, WITH OPHTHALMIC COMPLICATIONS 0/99/8338  . 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 HYPERKALEMIA 06/01/2008   due to DM neparopathy  . HYPERLIPIDEMIA 06/13/2007  . HYPERTENSION 06/13/2007  . NEOPLASM, MALIGNANT, SKIN, FACE 06/01/2008  . Other chronic nonalcoholic liver disease 2/50/5397  . RENAL DISEASE 06/01/2008   Past Surgical History:  Procedure Laterality Date  . Carotid Duplex  09/12/2004  . CATARACT EXTRACTION    . CHOLECYSTECTOMY    . ESOPHAGOGASTRODUODENOSCOPY  06/28/1991  . HERNIA REPAIR     right Inguinal hernia  . Rest/Stress Cardiolite  01/22/2001     Social History:  Ambulatory  walker      reports that he quit smoking about 39 years ago. he has never used smokeless tobacco. He reports that he does not drink alcohol or use drugs.  Allergies:  No Known Allergies     Family History: unable to obtain due to dementia Medications: Prior to Admission medications   Medication Sig Start Date End Date Taking? Authorizing Provider  Acetaminophen 500 MG coapsule Take 500-1,000 mg by mouth every 6 (six) hours as needed for headache (pain).   Yes [provider]  amLODipine (NORVASC) 5 MG tablet Take 1 tablet (5 mg total) by mouth daily. 01/09/18  Yes Domenic Polite, MD  aspirin EC 81 MG tablet Take 1 tablet (81 mg total) by mouth daily. 08/04/17  Yes [name], MD  insulin lispro (HUMALOG) 100 UNIT/ML injection 3 times a day (just before each meal), 15-10-10 units Patient taking differently: Inject 3-25 Units into the skin once. 3 times a day (just before each meal), 15-10-10 units 10/07/17  Yes [name], MD  insulin NPH Human (HUMULIN N) 100 UNIT/ML injection Inject 0.05 mLs (5 Units total) into the skin at bedtime. 07/07/17  Yes [name], MD  meclizine (ANTIVERT) 12.5 MG tablet Take 1 tablet (12.5 mg total) by mouth 3 (three) times daily as needed for dizziness. Patient taking differently: Take 12.5 mg by mouth 3 (three) times daily.  11/20/17  Yes [name], MD  pravastatin (PRAVACHOL) 80 MG tablet TAKE 1 TABLET BY MOUTH ONCE DAILY Patient taking differently: TAKE 80 mg  TABLET BY MOUTH ONCE DAILY 11/24/17  Yes [name], MD  nitroGLYCERIN (NITROSTAT) 0.4 MG SL tablet Place 1 tablet (0.4 mg total) under the tongue every 5 (five) minutes as needed. For chest pain 08/04/17   [name], MD    Physical Exam: Patient Vitals for the past 24 hrs:  BP Temp Temp src Pulse Resp SpO2 Height Weight  01/12/18 1345 (!) 155/61 - - 80 - 96 % - -  01/12/18 1330 (!) 157/83 - - (!) 103 - 99 % - -  01/12/18 1153 - - - - - - 6' 1"  (1.854 m) 81.2 kg (179 lb)  01/12/18 1148 - - - - - 100 % - -  01/12/18 1136 (!) 160/62 98.5 F (36.9 C) Oral 87 18 100 % - -    1. General:  in No Acute distress Chronically ill  -appearing 2. Psychological: Alert and  Oriented 3. Head/ENT:     Dry Mucous Membranes                          Head Non traumatic, neck supple                            Poor Dentition 4. SKIN:   decreased Skin turgor,  Skin clean Dry and intact no rash 5. Heart: Regular rate and rhythm no  Murmur, no Rub or gallop 6. Lungs:  no wheezes occasional crackles   7. Abdomen: Soft, non-tender, Non distended  bowel sounds present, large Right inguinal hernia 8. Lower extremities: no clubbing, cyanosis, or edema 9. Neurologically Grossly  intact, moving all 4 extremities equally    10. MSK: Normal range of motion   body mass index is 23.62 kg/m.  Labs on Admission:   Labs on Admission: I have personally reviewed following labs and imaging studies  CBC: Recent Labs  Lab 01/07/18 1551 01/08/18 0200 01/12/18 1248  WBC 6.4  --  6.9  NEUTROABS  --   --  5.3  HGB 11.6*  --  10.9*  HCT 35.1* 34.1* 32.8*  MCV 95.6  --  96.5  PLT 131*  --  224*   Basic Metabolic Panel: Recent Labs  Lab 01/07/18 1551 01/09/18 0724 01/12/18 1248  NA 142 140 139  K 4.5 4.1 5.1  CL 109 110 109  CO2 19* 19* 17*  GLUCOSE 150* 134* 264*  BUN 58* 46* 61*  CREATININE 2.68* 2.20* 2.74*  CALCIUM 9.1 8.6* 8.8*   GFR: Estimated Creatinine Clearance: 19.8 mL/min (A) (by C-G formula based on SCr of 2.74 mg/dL (H)). Liver Function Tests: Recent Labs  Lab 01/12/18 1248  AST 52*  ALT 27  ALKPHOS 103  BILITOT 1.1  PROT 6.4*  ALBUMIN 3.4*   No results for input(s): LIPASE, AMYLASE in the last 168 hours. Recent Labs  Lab 01/07/18 2312  AMMONIA 22   Coagulation Profile: No results for input(s): INR, PROTIME in the last 168 hours. Cardiac Enzymes: No results for input(s): CKTOTAL, CKMB, CKMBINDEX, TROPONINI in the last 168 hours. BNP (last 3 results) No results for input(s): PROBNP in the last 8760 hours. HbA1C: No results for input(s): HGBA1C in the last 72 hours. CBG: Recent Labs  Lab 01/08/18 1102 01/08/18 1630 01/08/18 2218 01/09/18 0641 01/09/18 0840  GLUCAP 278* 179* 190* 111* 166*   Lipid Profile: No results for input(s): CHOL, HDL, LDLCALC, TRIG, CHOLHDL, LDLDIRECT in the last  72 hours. Thyroid Function Tests: No results for input(s): TSH, T4TOTAL, FREET4, T3FREE, THYROIDAB in the last 72 hours. Anemia Panel: No results for input(s): VITAMINB12, FOLATE, FERRITIN, TIBC, IRON, RETICCTPCT in the last 72 hours. Urine analysis:    Component Value Date/Time   COLORURINE YELLOW 01/12/2018 [place] 01/12/2018 1248   LABSPEC 1.012 01/12/2018 1248   PHURINE 5.0 01/12/2018 1248   GLUCOSEU 150 (A) 01/12/2018 1248   GLUCOSEU NEGATIVE 09/07/2014 1334   HGBUR MODERATE (A) 01/12/2018 1248   BILIRUBINUR NEGATIVE 01/12/2018 1248   KETONESUR NEGATIVE 01/12/2018 1248   PROTEINUR 100 (A) 01/12/2018 1248   UROBILINOGEN 0.2 09/07/2014 1334   NITRITE NEGATIVE 01/12/2018 1248   LEUKOCYTESUR NEGATIVE 01/12/2018 1248   Sepsis Labs: @LABRCNTIP (procalcitonin:4,lacticidven:4) )No results found for this or any previous visit (from the past 240 hour(s)).     UA   no evidence of UTI    Lab Results  Component Value Date   HGBA1C 6.4 01/07/2018    Estimated Creatinine Clearance: 19.8 mL/min (A) (by C-G formula based on SCr of 2.74 mg/dL (H)).  BNP (last 3 results) No results for input(s): PROBNP in the last 8760 hours.   ECG REPORT None obtained Filed Weights   01/12/18 1153  Weight: 81.2 kg (179 lb)     Cultures: No results found for: SDES, SPECREQUEST, CULT, REPTSTATUS   Radiological Exams on Admission: Dg Chest 2 View  Result Date: 01/12/2018 CLINICAL DATA:  Confusion. EXAM: CHEST  2 VIEW COMPARISON:  Radiographs of January 07, 2018. FINDINGS: The heart size and mediastinal contours are within normal limits. Atherosclerosis of thoracic aorta is noted. Left lung is clear. Mildly increased right middle lobe opacity is noted concerning for worsening atelectasis or infiltrate. The visualized skeletal structures are unremarkable. IMPRESSION: Mildly increased right middle lobe opacity is noted concerning for worsening atelectasis or infiltrate. Aortic Atherosclerosis (ICD10-I70.0). Electronically Signed   By: Marijo Conception, M.D.   On: 01/12/2018 16:19   Ct Head Wo Contrast  Result Date: 01/12/2018 CLINICAL DATA:  Altered mental status EXAM: CT HEAD WITHOUT CONTRAST TECHNIQUE: Contiguous axial images were obtained from the base of the skull through the vertex without intravenous  contrast. COMPARISON:  Head CT 01/07/2018 FINDINGS: Brain: No mass lesion, intraparenchymal hemorrhage or extra-axial collection. No evidence of acute cortical infarct. There is periventricular hypoattenuation compatible with chronic microvascular disease. Diffuse atrophy, unchanged. Vascular: Atherosclerotic calcification of the vertebral and internal carotid arteries at the skull base. Skull: Normal visualized skull base, calvarium and extracranial soft tissues. Sinuses/Orbits: No sinus fluid levels or advanced mucosal thickening. No mastoid effusion. Normal orbits. IMPRESSION: Unchanged generalized atrophy and sequelae of chronic ischemic microangiopathy without acute intracranial abnormality. Electronically Signed   By: Ulyses Jarred M.D.   On: 01/12/2018 16:37    Chart has been reviewed    Assessment/Plan   84 y.o. male with medical history significant of dementia, prostate cancer, CAD, CVA, DM 2, HTN, HLD, CKD, and ASH, LBBB  Admitted for  H CAP associated with acute delirium in the setting of dementia    Present on Admission: . Acute encephalopathy -   - most likely multifactorial secondary to combination of  infection and dehydration secondary to decreased by mouth intake,    - Will rehydrate   - treat underlining infection    - if no improvement may need further imaging to evaluate for CNS pathology  . NASH (nonalcoholic steatohepatitis) - ammonia 11, evidence of cirrhosis on CT will evaluate  liver function . HCAP (healthcare-associated pneumonia) -  - will admit for treatment of HCAP will start on appropriate antibiotic coverage.   Obtain:  sputum cultures,                blood cultures if febrile or if decompensates.                   strep pneumo UA antigen,                 Provide oxygen as needed.   Marland Kitchen AKI (acute kidney injury) ([place]) in the setting of CKD - - likely secondary to dehydration, check FeNA and if not improved with IVF , per CR no evidence of hydronephrosis  .  Dehydration -we will rehydrate DM2 - - Order Sensitive  SSI    -  check TSH and HgA1C   Dementia -expect sundowning while hospitalized currently improved with pain management  Inguinal hernia - reduced in ER, no evidence of incaceration as per CT of abdomen scrotal pain has improved No surgical intervention indicated at this point  Other plan as per orders.  DVT prophylaxis:  SCD     Code Status:  DNI  as per prior notes, please confirm with family in AM  Family Communication:   Family not  at  Bedside   Disposition Plan:   likely will need placement for rehabilitation                                               Would benefit from PT/OT eval prior to DC  ordered                       Social Work Nutrition  Palliative care   consulted                          Consults called: none   Admission status:    inpatient     Level of care        medical floor          I have spent a total of 56 min on this admission   Allah Reason 01/13/2018, 1:52 AM   [facility]  Pager [phone]   after 2 AM please page floor coverage PA If 7AM-7PM, please contact the day team taking care of the patient  [email or web address]  Password [email or web address]

## 2018-01-12 NOTE — Progress Notes (Signed)
A consult was received from an ED physician for Cefepime and Vancomycin per pharmacy dosing.  The patient's profile has been reviewed for ht/wt/allergies/indication/available labs. A one time order has been placed for the above antibiotics.  Further antibiotics/pharmacy consults should be ordered by admitting physician if indicated.                       Reuel Boom, PharmD, BCPS [phone] 01/12/2018, 5:22 PM

## 2018-01-12 NOTE — ED Notes (Signed)
Family son contact Louie Casa [phone]

## 2018-01-13 ENCOUNTER — Other Ambulatory Visit: Payer: Self-pay

## 2018-01-13 LAB — CBC
HEMATOCRIT: 30.2 % — AB (ref 39.0–52.0)
Hemoglobin: 10.1 g/dL — ABNORMAL LOW (ref 13.0–17.0)
MCH: 32.5 pg (ref 26.0–34.0)
MCHC: 33.4 g/dL (ref 30.0–36.0)
MCV: 97.1 fL (ref 78.0–100.0)
Platelets: 90 10*3/uL — ABNORMAL LOW (ref 150–400)
RBC: 3.11 MIL/uL — AB (ref 4.22–5.81)
RDW: 13.5 % (ref 11.5–15.5)
WBC: 4.2 10*3/uL (ref 4.0–10.5)

## 2018-01-13 LAB — COMPREHENSIVE METABOLIC PANEL
ALBUMIN: 3.2 g/dL — AB (ref 3.5–5.0)
ALT: 23 U/L (ref 17–63)
AST: 36 U/L (ref 15–41)
Alkaline Phosphatase: 96 U/L (ref 38–126)
Anion gap: 8 (ref 5–15)
BILIRUBIN TOTAL: 1.1 mg/dL (ref 0.3–1.2)
BUN: 44 mg/dL — ABNORMAL HIGH (ref 6–20)
CO2: 20 mmol/L — ABNORMAL LOW (ref 22–32)
Calcium: 8.7 mg/dL — ABNORMAL LOW (ref 8.9–10.3)
Chloride: 115 mmol/L — ABNORMAL HIGH (ref 101–111)
Creatinine, Ser: 2.03 mg/dL — ABNORMAL HIGH (ref 0.61–1.24)
GFR calc Af Amer: 31 mL/min — ABNORMAL LOW (ref >60)
GFR calc non Af Amer: 27 mL/min — ABNORMAL LOW (ref >60)
GLUCOSE: 136 mg/dL — AB (ref 65–99)
POTASSIUM: 4.1 mmol/L (ref 3.5–5.1)
Sodium: 143 mmol/L (ref 135–145)
TOTAL PROTEIN: 6.1 g/dL — AB (ref 6.5–8.1)

## 2018-01-13 LAB — PREALBUMIN: Prealbumin: 15.6 mg/dL — ABNORMAL LOW (ref 18–38)

## 2018-01-13 LAB — GLUCOSE, CAPILLARY
GLUCOSE-CAPILLARY: 188 mg/dL — AB (ref 65–99)
Glucose-Capillary: 197 mg/dL — ABNORMAL HIGH (ref 65–99)

## 2018-01-13 LAB — TSH: TSH: 3.003 u[IU]/mL (ref 0.350–4.500)

## 2018-01-13 LAB — STREP PNEUMONIAE URINARY ANTIGEN: Strep Pneumo Urinary Antigen: NEGATIVE

## 2018-01-13 LAB — CBG MONITORING, ED
GLUCOSE-CAPILLARY: 127 mg/dL — AB (ref 65–99)
GLUCOSE-CAPILLARY: 144 mg/dL — AB (ref 65–99)
Glucose-Capillary: 125 mg/dL — ABNORMAL HIGH (ref 65–99)

## 2018-01-13 LAB — PHOSPHORUS: PHOSPHORUS: 3.5 mg/dL (ref 2.5–4.6)

## 2018-01-13 LAB — MRSA PCR SCREENING: MRSA by PCR: NEGATIVE

## 2018-01-13 LAB — MAGNESIUM: MAGNESIUM: 2.1 mg/dL (ref 1.7–2.4)

## 2018-01-13 MED ORDER — ONDANSETRON HCL 4 MG/2ML IJ SOLN
4.0000 mg | Freq: Four times a day (QID) | INTRAMUSCULAR | Status: DC | PRN
Start: 2018-01-13 — End: 2018-01-16

## 2018-01-13 MED ORDER — ONDANSETRON HCL 4 MG PO TABS: 4.0000 mg | ORAL_TABLET | Freq: Four times a day (QID) | ORAL | Status: DC | PRN

## 2018-01-13 MED ORDER — AMLODIPINE BESYLATE 5 MG PO TABS
5.0000 mg | ORAL_TABLET | Freq: Every day | ORAL | Status: DC
  Administered 2018-01-13 – 2018-01-16 (×4): 5 mg via ORAL
  Filled 2018-01-13 (×4): qty 1

## 2018-01-13 MED ORDER — GUAIFENESIN ER 600 MG PO TB12
600.0000 mg | ORAL_TABLET | Freq: Two times a day (BID) | ORAL | Status: DC
  Administered 2018-01-13 – 2018-01-16 (×8): 600 mg via ORAL
  Filled 2018-01-13 (×9): qty 1

## 2018-01-13 MED ORDER — PRAVASTATIN SODIUM 40 MG PO TABS
80.0000 mg | ORAL_TABLET | Freq: Every day | ORAL | Status: DC
  Administered 2018-01-13 – 2018-01-16 (×4): 80 mg via ORAL
  Filled 2018-01-13 (×5): qty 2

## 2018-01-13 MED ORDER — ACETAMINOPHEN 650 MG RE SUPP: 650.0000 mg | Freq: Four times a day (QID) | RECTAL | Status: DC | PRN

## 2018-01-13 MED ORDER — INSULIN ASPART 100 UNIT/ML ~~LOC~~ SOLN
0.0000 [IU] | Freq: Three times a day (TID) | SUBCUTANEOUS | Status: DC
  Administered 2018-01-13 – 2018-01-14 (×3): 2 [IU] via SUBCUTANEOUS
  Administered 2018-01-14 – 2018-01-16 (×4): 3 [IU] via SUBCUTANEOUS
  Administered 2018-01-16: 2 [IU] via SUBCUTANEOUS

## 2018-01-13 MED ORDER — SODIUM CHLORIDE 0.9 % IV SOLN
INTRAVENOUS | Status: DC
  Administered 2018-01-13: 01:00:00 via INTRAVENOUS

## 2018-01-13 MED ORDER — ACETAMINOPHEN 325 MG PO TABS: 650.0000 mg | ORAL_TABLET | Freq: Four times a day (QID) | ORAL | Status: DC | PRN

## 2018-01-13 MED ORDER — HALOPERIDOL LACTATE 5 MG/ML IJ SOLN: 2.0000 mg | Freq: Four times a day (QID) | INTRAMUSCULAR | Status: DC | PRN

## 2018-01-13 MED ORDER — INSULIN ASPART 100 UNIT/ML ~~LOC~~ SOLN: 0.0000 [IU] | Freq: Every day | SUBCUTANEOUS | Status: DC

## 2018-01-13 MED ORDER — HYDROCODONE-ACETAMINOPHEN 5-325 MG PO TABS: 1.0000 | ORAL_TABLET | ORAL | Status: DC | PRN

## 2018-01-13 MED ORDER — ASPIRIN EC 81 MG PO TBEC
81.0000 mg | DELAYED_RELEASE_TABLET | Freq: Every day | ORAL | Status: DC
  Administered 2018-01-13 – 2018-01-16 (×4): 81 mg via ORAL
  Filled 2018-01-13 (×5): qty 1

## 2018-01-13 MED ORDER — ALBUTEROL SULFATE (2.5 MG/3ML) 0.083% IN NEBU: 2.5000 mg | INHALATION_SOLUTION | RESPIRATORY_TRACT | Status: DC | PRN

## 2018-01-13 MED ORDER — QUETIAPINE FUMARATE 25 MG PO TABS
25.0000 mg | ORAL_TABLET | Freq: Every day | ORAL | Status: DC
  Administered 2018-01-13 – 2018-01-15 (×3): 25 mg via ORAL
  Filled 2018-01-13 (×3): qty 1

## 2018-01-13 MED ORDER — LORAZEPAM 2 MG/ML IJ SOLN
0.5000 mg | Freq: Four times a day (QID) | INTRAMUSCULAR | Status: DC | PRN
  Administered 2018-01-14: 0.5 mg via INTRAVENOUS
  Filled 2018-01-13: qty 1

## 2018-01-13 MED ORDER — SODIUM CHLORIDE 0.45 % IV SOLN
INTRAVENOUS | Status: AC
  Administered 2018-01-13: 09:00:00 via INTRAVENOUS

## 2018-01-13 NOTE — Progress Notes (Signed)
Palliative Medicine consult noted. Due to high referral volume, there may be a delay seeing this patient. Please call the Palliative Medicine Team office at [phone] if recommendations are needed in the interim.  Thank you for inviting [place] to see this patient.  Marjie Skiff Lauran Romanski, RN, BSN, [facility] Palliative Medicine Team 01/13/2018 8:21 AM Office [phone]

## 2018-01-13 NOTE — ED Notes (Signed)
Bed: WA31 Expected date:  Expected time:  Means of arrival:  Comments: Hold for 16 

## 2018-01-13 NOTE — ED Notes (Signed)
Bed: XI71 Expected date:  Expected time:  Means of arrival:  Comments: Room 2

## 2018-01-13 NOTE — Progress Notes (Signed)
[facility] PROGRESS NOTE    Progress Note  Chad Mcmillan  IZT:[id number] DOB: September 09, 1924 DOA: 01/12/2018 PCP: [name], MD     Brief Narrative:   Chad Mcmillan is an 84 y.o. male past medical history of dementia, prostate cancer diabetes mellitus type 2 left bundle branch block presents with contusion to the emergency room, as per family members got progressive confusion balance difficulties with repeated falls and significant agitation.  Recently discharged from the hospital and acute encephalopathy  Assessment/Plan:   Acute encephalopathy: Multifactorial in the setting of infectious etiology (possible pneumonia) and new acute renal failure likely due to decreased oral intake.  H CAP: Started empirically on vancomycin and cefepime culture data is pending. Antigen labs are pending. De-escalate antibiotics quickly will continue for an additional 24 hours.  Acute renal failure: In the setting of infectious etiology started on IV fluid hydration. Baseline creatinine 1.8-2.0, is improving significantly with IV fluids.  Advanced dementia with behavioral disturbances: Will need to consult hospice imperative care to discuss with family goals of care. Check a 12-lead EKG will start him on Haldol as needed for agitation. check twelve-lead EKG in the morning start Seroquel at night.    DM (diabetes mellitus) ([place]) Continue sliding scale insulin.   DVT prophylaxis: lovenox Family Communication:none Disposition Plan/Barrier to D/C: unable to detemrine Code Status:     Code Status Orders  (From admission, onward)        Start     Ordered   01/13/18 0004  Limited resuscitation (code)  Continuous    Question Answer Comment  In the event of cardiac or respiratory ARREST: Initiate Code Blue, Call Rapid Response Yes   In the event of cardiac or respiratory ARREST: Perform CPR Yes   In the event of cardiac or respiratory ARREST: Perform Intubation/Mechanical Ventilation No     In the event of cardiac or respiratory ARREST: Use NIPPV/BiPAp only if indicated Yes   In the event of cardiac or respiratory ARREST: Administer ACLS medications if indicated Yes   In the event of cardiac or respiratory ARREST: Perform Defibrillation or Cardioversion if indicated Yes      01/13/18 0003    Code Status History    Date Active Date Inactive Code Status Order ID Comments User Context   01/07/2018 22:42 01/09/2018 17:08 Partial Code [id number]  Ivor Costa, MD ED    Advance Directive Documentation     Most Recent Value  Type of Advance Directive  Healthcare Power of Attorney  Pre-existing out of facility DNR order (yellow form or pink MOST form)  No data  "MOST" Form in Place?  No data        IV Access:    Peripheral IV   Procedures and diagnostic studies:   Ct Abdomen Pelvis Wo Contrast  Result Date: 01/12/2018 CLINICAL DATA:  Nausea, vomiting. EXAM: CT ABDOMEN AND PELVIS WITHOUT CONTRAST TECHNIQUE: Multidetector CT imaging of the abdomen and pelvis was performed following the standard protocol without IV contrast. COMPARISON:  None. FINDINGS: Lower chest: Left posterior basilar airspace opacity is noted most consistent with pneumonia. Right middle lobe opacity is also noted concerning for pneumonia. Hepatobiliary: Status post cholecystectomy. Nodular hepatic margins are noted concerning for hepatic cirrhosis. No focal lesion is seen in these unenhanced images. Pancreas: Unremarkable. No pancreatic ductal dilatation or surrounding inflammatory changes. Spleen: Normal in size without focal abnormality. Adrenals/Urinary Tract: Adrenal glands appear normal. Bilateral renal cysts are noted. No hydronephrosis or renal obstruction is noted. No  renal or ureteral calculi are noted. Urinary bladder is unremarkable. Stomach/Bowel: The stomach appears normal. The appendix appears normal. Sigmoid diverticulosis is noted without inflammation. There is no evidence of bowel obstruction.  Vascular/Lymphatic: Aortic atherosclerosis. No enlarged abdominal or pelvic lymph nodes. Reproductive: Surgical clips are seen in the region of the prostate gland. Other: Large right inguinal hernia is noted which contains loops of small bowel, but does not result in incarceration or obstruction. Musculoskeletal: Old L2 compression fracture is noted. No other osseous abnormality is noted. IMPRESSION: Large right inguinal hernia is noted which contains loops of small bowel, but does not result in incarceration or obstruction. Left posterior basilar opacity is noted as well as right middle lobe opacity, concerning for possible pneumonia. Nodular hepatic contours are noted concerning for hepatic cirrhosis. Sigmoid diverticulosis is noted without inflammation. Old L2 compression fracture. Aortic Atherosclerosis (ICD10-I70.0). Electronically Signed   By: Marijo Conception, M.D.   On: 01/12/2018 21:46   Dg Chest 2 View  Result Date: 01/12/2018 CLINICAL DATA:  Confusion. EXAM: CHEST  2 VIEW COMPARISON:  Radiographs of January 07, 2018. FINDINGS: The heart size and mediastinal contours are within normal limits. Atherosclerosis of thoracic aorta is noted. Left lung is clear. Mildly increased right middle lobe opacity is noted concerning for worsening atelectasis or infiltrate. The visualized skeletal structures are unremarkable. IMPRESSION: Mildly increased right middle lobe opacity is noted concerning for worsening atelectasis or infiltrate. Aortic Atherosclerosis (ICD10-I70.0). Electronically Signed   By: Marijo Conception, M.D.   On: 01/12/2018 16:19   Ct Head Wo Contrast  Result Date: 01/12/2018 CLINICAL DATA:  Altered mental status EXAM: CT HEAD WITHOUT CONTRAST TECHNIQUE: Contiguous axial images were obtained from the base of the skull through the vertex without intravenous contrast. COMPARISON:  Head CT 01/07/2018 FINDINGS: Brain: No mass lesion, intraparenchymal hemorrhage or extra-axial collection. No evidence  of acute cortical infarct. There is periventricular hypoattenuation compatible with chronic microvascular disease. Diffuse atrophy, unchanged. Vascular: Atherosclerotic calcification of the vertebral and internal carotid arteries at the skull base. Skull: Normal visualized skull base, calvarium and extracranial soft tissues. Sinuses/Orbits: No sinus fluid levels or advanced mucosal thickening. No mastoid effusion. Normal orbits. IMPRESSION: Unchanged generalized atrophy and sequelae of chronic ischemic microangiopathy without acute intracranial abnormality. Electronically Signed   By: Ulyses Jarred M.D.   On: 01/12/2018 16:37     Medical Consultants:    None.  Anti-Infectives:   IV vancomycin and cefepime  Subjective:    Jamarea H Rushing laying in bed nonverbal sleeping.  Objective:    Vitals:   01/13/18 0501 01/13/18 0516 01/13/18 0600 01/13/18 0656  BP:  (!) 169/53 (!) 172/57 (!) 172/57  Pulse: 64 60 62 68  Resp:  14  12  Temp:      TempSrc:      SpO2:  99% 98% 100%  Weight:      Height:        Intake/Output Summary (Last 24 hours) at 01/13/2018 0659 Last data filed at 01/12/2018 2022 Gross per 24 hour  Intake 1800 ml  Output -  Net 1800 ml   Filed Weights   01/12/18 1153  Weight: 81.2 kg (179 lb)    Exam: General exam: In no acute distress. Respiratory system: Good air movement and clear to auscultation. Cardiovascular system: S1 & S2 heard, RRR.  Gastrointestinal system: Abdomen is nondistended, soft and nontender.  Extremities: No pedal edema. Skin: No rashes, lesions or ulcers    Data Reviewed:  Labs: Basic Metabolic Panel: Recent Labs  Lab 01/07/18 1551 01/09/18 0724 01/12/18 1248 01/13/18 0453  NA 142 140 139 143  K 4.5 4.1 5.1 4.1  CL 109 110 109 115*  CO2 19* 19* 17* 20*  GLUCOSE 150* 134* 264* 136*  BUN 58* 46* 61* 44*  CREATININE 2.68* 2.20* 2.74* 2.03*  CALCIUM 9.1 8.6* 8.8* 8.7*  MG  --   --   --  2.1  PHOS  --   --   --  3.5    GFR Estimated Creatinine Clearance: 26.8 mL/min (A) (by C-G formula based on SCr of 2.03 mg/dL (H)). Liver Function Tests: Recent Labs  Lab 01/12/18 1248 01/13/18 0453  AST 52* 36  ALT 27 23  ALKPHOS 103 96  BILITOT 1.1 1.1  PROT 6.4* 6.1*  ALBUMIN 3.4* 3.2*   No results for input(s): LIPASE, AMYLASE in the last 168 hours. Recent Labs  Lab 01/07/18 2312 01/12/18 1942  AMMONIA 22 11   Coagulation profile No results for input(s): INR, PROTIME in the last 168 hours.  CBC: Recent Labs  Lab 01/07/18 1551 01/08/18 0200 01/12/18 1248 01/13/18 0453  WBC 6.4  --  6.9 4.2  NEUTROABS  --   --  5.3  --   HGB 11.6*  --  10.9* 10.1*  HCT 35.1* 34.1* 32.8* 30.2*  MCV 95.6  --  96.5 97.1  PLT 131*  --  116* 90*   Cardiac Enzymes: No results for input(s): CKTOTAL, CKMB, CKMBINDEX, TROPONINI in the last 168 hours. BNP (last 3 results) No results for input(s): PROBNP in the last 8760 hours. CBG: Recent Labs  Lab 01/08/18 1630 01/08/18 2218 01/09/18 0641 01/09/18 0840 01/13/18 0050  GLUCAP 179* 190* 111* 166* 125*   D-Dimer: No results for input(s): DDIMER in the last 72 hours. Hgb A1c: No results for input(s): HGBA1C in the last 72 hours. Lipid Profile: No results for input(s): CHOL, HDL, LDLCALC, TRIG, CHOLHDL, LDLDIRECT in the last 72 hours. Thyroid function studies: Recent Labs    01/13/18 0453  TSH 3.003   Anemia work up: No results for input(s): VITAMINB12, FOLATE, FERRITIN, TIBC, IRON, RETICCTPCT in the last 72 hours. Sepsis Labs: Recent Labs  Lab 01/07/18 1551 01/12/18 1248 01/13/18 0453  WBC 6.4 6.9 4.2   Microbiology Recent Results (from the past 240 hour(s))  Blood culture (routine x 2)     Status: None (Preliminary result)   Collection Time: 01/12/18  6:15 PM  Result Value Ref Range Status   Specimen Description   Final    BLOOD RIGHT FOREARM Performed at [facility], [place] [address] [place], [place] [address]    Special Requests    Final    BOTTLES DRAWN AEROBIC AND ANAEROBIC Blood Culture adequate volume Performed at [facility] [address] [place],  [address]    Culture PENDING  Incomplete   Report Status PENDING  Incomplete     Medications:   . amLODipine  5 mg Oral Daily  . aspirin EC  81 mg Oral Daily  . guaiFENesin  600 mg Oral BID  . insulin aspart  0-5 Units Subcutaneous QHS  . insulin aspart  0-9 Units Subcutaneous TID WC  . pravastatin  80 mg Oral Daily   Continuous Infusions: . sodium chloride 75 mL/hr at 01/13/18 0038  . ceFEPime (MAXIPIME) IV    . vancomycin        LOS: 1 day   Charlynne Cousins  [facility] Pager [phone]  *Please refer to [email or web address], password [email or web address] to get updated schedule on who will round on this patient, as hospitalists switch teams weekly. If 7PM-7AM, please contact night-coverage at [email or web address], password [email or web address] for any overnight needs.  01/13/2018, 6:59 AM

## 2018-01-13 NOTE — Consult Note (Addendum)
   [facility] CM Inpatient Consult   01/13/2018  Chad Mcmillan 1924/08/23 [id number]    Made aware by Tommi Rumps with [facility] that patient did not admit to [facility] after all. Mr. Kassel refused [facility] services.   Spoke with ED [place] who was already aware of above.  Will update [facility] Care Management office.    Marthenia Rolling, MSN-Ed, RN,BSN [facility] Liaison [phone]

## 2018-01-14 DIAGNOSIS — F0391 Unspecified dementia with behavioral disturbance: Secondary | ICD-10-CM

## 2018-01-14 DIAGNOSIS — Z515 Encounter for palliative care: Secondary | ICD-10-CM

## 2018-01-14 DIAGNOSIS — Z7189 Other specified counseling: Secondary | ICD-10-CM

## 2018-01-14 DIAGNOSIS — N184 Chronic kidney disease, stage 4 (severe): Secondary | ICD-10-CM

## 2018-01-14 LAB — GLUCOSE, CAPILLARY
GLUCOSE-CAPILLARY: 152 mg/dL — AB (ref 65–99)
GLUCOSE-CAPILLARY: 234 mg/dL — AB (ref 65–99)
GLUCOSE-CAPILLARY: 176 mg/dL — AB (ref 65–99)
Glucose-Capillary: 148 mg/dL — ABNORMAL HIGH (ref 65–99)

## 2018-01-14 LAB — CREATININE, SERUM
Creatinine, Ser: 1.78 mg/dL — ABNORMAL HIGH (ref 0.61–1.24)
GFR calc Af Amer: 37 mL/min — ABNORMAL LOW (ref >60)
GFR, EST NON AFRICAN AMERICAN: 32 mL/min — AB (ref >60)

## 2018-01-14 LAB — BASIC METABOLIC PANEL
Anion gap: 5 (ref 5–15)
BUN: 35 mg/dL — AB (ref 6–20)
CO2: 23 mmol/L (ref 22–32)
CREATININE: 2.09 mg/dL — AB (ref 0.61–1.24)
Calcium: 8.4 mg/dL — ABNORMAL LOW (ref 8.9–10.3)
Chloride: 107 mmol/L (ref 101–111)
GFR, EST AFRICAN AMERICAN: 30 mL/min — AB (ref >60)
GFR, EST NON AFRICAN AMERICAN: 26 mL/min — AB (ref >60)
Glucose, Bld: 253 mg/dL — ABNORMAL HIGH (ref 65–99)
Potassium: 4.3 mmol/L (ref 3.5–5.1)
SODIUM: 135 mmol/L (ref 135–145)

## 2018-01-14 LAB — HEMOGLOBIN A1C
HEMOGLOBIN A1C: 6.5 % — AB (ref 4.8–5.6)
Mean Plasma Glucose: 140 mg/dL

## 2018-01-14 MED ORDER — DONEPEZIL HCL 10 MG PO TABS
5.0000 mg | ORAL_TABLET | Freq: Every day | ORAL | Status: DC
  Administered 2018-01-14 – 2018-01-15 (×2): 5 mg via ORAL
  Filled 2018-01-14 (×2): qty 1

## 2018-01-14 MED ORDER — LEVOFLOXACIN 500 MG PO TABS
500.0000 mg | ORAL_TABLET | Freq: Once | ORAL | Status: AC
  Administered 2018-01-14: 500 mg via ORAL
  Filled 2018-01-14: qty 1

## 2018-01-14 MED ORDER — LEVOFLOXACIN 250 MG PO TABS
250.0000 mg | ORAL_TABLET | Freq: Every day | ORAL | Status: DC
  Administered 2018-01-15: 250 mg via ORAL
  Filled 2018-01-14: qty 1

## 2018-01-14 MED ORDER — POLYVINYL ALCOHOL 1.4 % OP SOLN
1.0000 [drp] | OPHTHALMIC | Status: DC | PRN
  Administered 2018-01-14: 1 [drp] via OPHTHALMIC
  Filled 2018-01-14 (×2): qty 15

## 2018-01-14 MED ORDER — LEVOFLOXACIN 500 MG PO TABS
500.0000 mg | ORAL_TABLET | Freq: Every day | ORAL | Status: DC
  Filled 2018-01-14: qty 1

## 2018-01-14 MED ORDER — INSULIN DETEMIR 100 UNIT/ML ~~LOC~~ SOLN
5.0000 [IU] | Freq: Two times a day (BID) | SUBCUTANEOUS | Status: DC
  Administered 2018-01-14 – 2018-01-16 (×4): 5 [IU] via SUBCUTANEOUS
  Filled 2018-01-14 (×6): qty 0.05

## 2018-01-14 NOTE — Progress Notes (Deleted)
[facility] PROGRESS NOTE    Progress Note  Chad Mcmillan  HKV:[id number] DOB: 01-01-1924 DOA: 01/12/2018 PCP: [name], MD     Brief Narrative:   Chad Mcmillan is an 84 y.o. male past medical history of dementia, prostate cancer diabetes mellitus type 2 left bundle branch block presents with contusion to the emergency room, as per family members got progressive confusion balance difficulties with repeated falls and significant agitation.  Recently discharged from the hospital and acute encephalopathy  Assessment/Plan:   Acute encephalopathy: Multifactorial in the setting of infectious etiology (possible pneumonia) and new acute renal failure likely due to decreased oral intake.  H CAP: Started empirically on vancomycin and cefepime culture data is pending. Antigen labs are pending. De-escalate antibiotics quickly will continue for an additional 24 hours.  Acute renal failure: In the setting of infectious etiology started on IV fluid hydration. Baseline creatinine 1.8-2.0, is improving significantly with IV fluids.  Advanced dementia with behavioral disturbances: Will need to consult hospice imperative care to discuss with family goals of care. Check a 12-lead EKG will start him on Haldol as needed for agitation. check twelve-lead EKG in the morning start Seroquel at night.    DM (diabetes mellitus) ([facility]) Long-acting insulin, continue sliding scale insulin.   DVT prophylaxis: lovenox Family Communication:none Disposition Plan/Barrier to D/C: unable to detemrine Code Status:     Code Status Orders  (From admission, onward)        Start     Ordered   01/13/18 0004  Limited resuscitation (code)  Continuous    Question Answer Comment  In the event of cardiac or respiratory ARREST: Initiate Code Blue, Call Rapid Response Yes   In the event of cardiac or respiratory ARREST: Perform CPR Yes   In the event of cardiac or respiratory ARREST: Perform  Intubation/Mechanical Ventilation No   In the event of cardiac or respiratory ARREST: Use NIPPV/BiPAp only if indicated Yes   In the event of cardiac or respiratory ARREST: Administer ACLS medications if indicated Yes   In the event of cardiac or respiratory ARREST: Perform Defibrillation or Cardioversion if indicated Yes      01/13/18 0003    Code Status History    Date Active Date Inactive Code Status Order ID Comments User Context   01/07/2018 22:42 01/09/2018 17:08 Partial Code [id number]  Chad Costa, MD ED    Advance Directive Documentation     Most Recent Value  Type of Advance Directive  Healthcare Power of Attorney  Pre-existing out of facility DNR order (yellow form or pink MOST form)  No data  "MOST" Form in Place?  No data        IV Access:    Peripheral IV   Procedures and diagnostic studies:   Ct Abdomen Pelvis Wo Contrast  Result Date: 01/12/2018 CLINICAL DATA:  Nausea, vomiting. EXAM: CT ABDOMEN AND PELVIS WITHOUT CONTRAST TECHNIQUE: Multidetector CT imaging of the abdomen and pelvis was performed following the standard protocol without IV contrast. COMPARISON:  None. FINDINGS: Lower chest: Left posterior basilar airspace opacity is noted most consistent with pneumonia. Right middle lobe opacity is also noted concerning for pneumonia. Hepatobiliary: Status post cholecystectomy. Nodular hepatic margins are noted concerning for hepatic cirrhosis. No focal lesion is seen in these unenhanced images. Pancreas: Unremarkable. No pancreatic ductal dilatation or surrounding inflammatory changes. Spleen: Normal in size without focal abnormality. Adrenals/Urinary Tract: Adrenal glands appear normal. Bilateral renal cysts are noted. No hydronephrosis or renal obstruction is noted.  No renal or ureteral calculi are noted. Urinary bladder is unremarkable. Stomach/Bowel: The stomach appears normal. The appendix appears normal. Sigmoid diverticulosis is noted without inflammation. There is  no evidence of bowel obstruction. Vascular/Lymphatic: Aortic atherosclerosis. No enlarged abdominal or pelvic lymph nodes. Reproductive: Surgical clips are seen in the region of the prostate gland. Other: Large right inguinal hernia is noted which contains loops of small bowel, but does not result in incarceration or obstruction. Musculoskeletal: Old L2 compression fracture is noted. No other osseous abnormality is noted. IMPRESSION: Large right inguinal hernia is noted which contains loops of small bowel, but does not result in incarceration or obstruction. Left posterior basilar opacity is noted as well as right middle lobe opacity, concerning for possible pneumonia. Nodular hepatic contours are noted concerning for hepatic cirrhosis. Sigmoid diverticulosis is noted without inflammation. Old L2 compression fracture. Aortic Atherosclerosis (ICD10-I70.0). Electronically Signed   By: Marijo Conception, M.D.   On: 01/12/2018 21:46   Dg Chest 2 View  Result Date: 01/12/2018 CLINICAL DATA:  Confusion. EXAM: CHEST  2 VIEW COMPARISON:  Radiographs of January 07, 2018. FINDINGS: The heart size and mediastinal contours are within normal limits. Atherosclerosis of thoracic aorta is noted. Left lung is clear. Mildly increased right middle lobe opacity is noted concerning for worsening atelectasis or infiltrate. The visualized skeletal structures are unremarkable. IMPRESSION: Mildly increased right middle lobe opacity is noted concerning for worsening atelectasis or infiltrate. Aortic Atherosclerosis (ICD10-I70.0). Electronically Signed   By: Marijo Conception, M.D.   On: 01/12/2018 16:19   Ct Head Wo Contrast  Result Date: 01/12/2018 CLINICAL DATA:  Altered mental status EXAM: CT HEAD WITHOUT CONTRAST TECHNIQUE: Contiguous axial images were obtained from the base of the skull through the vertex without intravenous contrast. COMPARISON:  Head CT 01/07/2018 FINDINGS: Brain: No mass lesion, intraparenchymal hemorrhage or  extra-axial collection. No evidence of acute cortical infarct. There is periventricular hypoattenuation compatible with chronic microvascular disease. Diffuse atrophy, unchanged. Vascular: Atherosclerotic calcification of the vertebral and internal carotid arteries at the skull base. Skull: Normal visualized skull base, calvarium and extracranial soft tissues. Sinuses/Orbits: No sinus fluid levels or advanced mucosal thickening. No mastoid effusion. Normal orbits. IMPRESSION: Unchanged generalized atrophy and sequelae of chronic ischemic microangiopathy without acute intracranial abnormality. Electronically Signed   By: Ulyses Jarred M.D.   On: 01/12/2018 16:37     Medical Consultants:    None.  Anti-Infectives:   IV vancomycin and cefepime  Subjective:    Ferlin H Capozzoli laying in bed nonverbal sleeping.  Objective:    Vitals:   01/13/18 1441 01/13/18 1608 01/13/18 2154 01/14/18 0544  BP: (!) 167/80 (!) 196/74 (!) 190/76 (!) 166/65  Pulse:  77 82 79  Resp:  16 16 17   Temp:  (!) 97.5 F (36.4 C) 98.1 F (36.7 C) (!) 97.4 F (36.3 C)  TempSrc:  Oral Oral Oral  SpO2:  100% 99% 99%  Weight:      Height:        Intake/Output Summary (Last 24 hours) at 01/14/2018 1036 Last data filed at 01/14/2018 9166 Gross per 24 hour  Intake 740 ml  Output 1925 ml  Net -1185 ml   Filed Weights   01/12/18 1153  Weight: 81.2 kg (179 lb)    Exam: General exam: In no acute distress. Respiratory system: Good air movement and clear to auscultation. Cardiovascular system: S1 & S2 heard, RRR.  Gastrointestinal system: Abdomen is nondistended, soft and nontender.  Extremities: No pedal  edema. Skin: No rashes, lesions or ulcers    Data Reviewed:    Labs: Basic Metabolic Panel: Recent Labs  Lab 01/07/18 1551 01/09/18 0724 01/12/18 1248 01/13/18 0453 01/14/18 0606  NA 142 140 139 143  --   K 4.5 4.1 5.1 4.1  --   CL 109 110 109 115*  --   CO2 19* 19* 17* 20*  --   GLUCOSE 150*  134* 264* 136*  --   BUN 58* 46* 61* 44*  --   CREATININE 2.68* 2.20* 2.74* 2.03* 1.78*  CALCIUM 9.1 8.6* 8.8* 8.7*  --   MG  --   --   --  2.1  --   PHOS  --   --   --  3.5  --    GFR Estimated Creatinine Clearance: 30.5 mL/min (A) (by C-G formula based on SCr of 1.78 mg/dL (H)). Liver Function Tests: Recent Labs  Lab 01/12/18 1248 01/13/18 0453  AST 52* 36  ALT 27 23  ALKPHOS 103 96  BILITOT 1.1 1.1  PROT 6.4* 6.1*  ALBUMIN 3.4* 3.2*   No results for input(s): LIPASE, AMYLASE in the last 168 hours. Recent Labs  Lab 01/07/18 2312 01/12/18 1942  AMMONIA 22 11   Coagulation profile No results for input(s): INR, PROTIME in the last 168 hours.  CBC: Recent Labs  Lab 01/07/18 1551 01/08/18 0200 01/12/18 1248 01/13/18 0453  WBC 6.4  --  6.9 4.2  NEUTROABS  --   --  5.3  --   HGB 11.6*  --  10.9* 10.1*  HCT 35.1* 34.1* 32.8* 30.2*  MCV 95.6  --  96.5 97.1  PLT 131*  --  116* 90*   Cardiac Enzymes: No results for input(s): CKTOTAL, CKMB, CKMBINDEX, TROPONINI in the last 168 hours. BNP (last 3 results) No results for input(s): PROBNP in the last 8760 hours. CBG: Recent Labs  Lab 01/13/18 0819 01/13/18 1146 01/13/18 1711 01/13/18 2105 01/14/18 0724  GLUCAP 127* 144* 188* 197* 152*   D-Dimer: No results for input(s): DDIMER in the last 72 hours. Hgb A1c: Recent Labs    01/13/18 0453  HGBA1C 6.5*   Lipid Profile: No results for input(s): CHOL, HDL, LDLCALC, TRIG, CHOLHDL, LDLDIRECT in the last 72 hours. Thyroid function studies: Recent Labs    01/13/18 0453  TSH 3.003   Anemia work up: No results for input(s): VITAMINB12, FOLATE, FERRITIN, TIBC, IRON, RETICCTPCT in the last 72 hours. Sepsis Labs: Recent Labs  Lab 01/07/18 1551 01/12/18 1248 01/13/18 0453  WBC 6.4 6.9 4.2   Microbiology Recent Results (from the past 240 hour(s))  Blood culture (routine x 2)     Status: None (Preliminary result)   Collection Time: 01/12/18  6:15 PM  Result  Value Ref Range Status   Specimen Description   Final    BLOOD RIGHT FOREARM Performed at [facility], [place] [address], [place], [place] [address]    Special Requests   Final    BOTTLES DRAWN AEROBIC AND ANAEROBIC Blood Culture adequate volume Performed at [facility] [address] [place], [place] [address]    Culture   Final    NO GROWTH 2 DAYS Performed at [facility] [address], [place], [place] [address]    Report Status PENDING  Incomplete  Blood culture (routine x 2)     Status: None (Preliminary result)   Collection Time: 01/12/18  6:15 PM  Result Value Ref Range Status  Specimen Description   Final    BLOOD LEFT ANTECUBITAL Performed at [facility] [address] [place], [place] [address]    Special Requests   Final    BOTTLES DRAWN AEROBIC AND ANAEROBIC Blood Culture adequate volume Performed at [facility] [address], [place], [place] [address]    Culture   Final    NO GROWTH 2 DAYS Performed at [facility] [address] [place], [place] [address]    Report Status PENDING  Incomplete  MRSA PCR Screening     Status: None   Collection Time: 01/13/18  6:55 AM  Result Value Ref Range Status   MRSA by PCR NEGATIVE NEGATIVE Final    Comment:        The GeneXpert MRSA Assay (FDA approved for NASAL specimens only), is one component of a comprehensive MRSA colonization surveillance program. It is not intended to diagnose MRSA infection nor to guide or monitor treatment for MRSA infections. Performed at [facility], [place] [address] [place], [place] [address]      Medications:   . amLODipine  5 mg Oral Daily  . aspirin EC  81 mg Oral Daily  . guaiFENesin  600 mg Oral BID  . insulin aspart  0-5 Units Subcutaneous QHS  . insulin aspart  0-9 Units Subcutaneous TID WC  . pravastatin  80 mg Oral Daily  . QUEtiapine  25 mg Oral QHS   Continuous  Infusions: . ceFEPime (MAXIPIME) IV Stopped (01/13/18 2216)  . vancomycin        LOS: 2 days   Charlynne Cousins  [facility] Pager [phone]  *Please refer to [email or web address], password [email or web address] to get updated schedule on who will round on this patient, as hospitalists switch teams weekly. If 7PM-7AM, please contact night-coverage at [email or web address], password [email or web address] for any overnight needs.  01/14/2018, 10:36 AM

## 2018-01-14 NOTE — Progress Notes (Signed)
LCSW consulted for SNF placement.   LCSW acknowledges CSW consult.   LCSW spoke with patients son, randy regarding SNF rec. Patient and family have palliative meeting 2/27 at 3pm.   LCSW will continue to follow for CSW needs.   Carolin [place]

## 2018-01-14 NOTE — Evaluation (Signed)
Clinical/Bedside Swallow Evaluation Patient Details  Name: Chad Mcmillan MRN: [id number] Date of Birth: 1924-07-18  Today's Date: 01/14/2018 Time: SLP Start Time (ACUTE ONLY): 0930 SLP Stop Time (ACUTE ONLY): 0955 SLP Time Calculation (min) (ACUTE ONLY): 25 min  Past Medical History:  Past Medical History:  Diagnosis Date  . ADENOCARCINOMA, PROSTATE 06/01/2008  . CORONARY ARTERY DISEASE 06/13/2007  . CVA 06/01/2008   x 2  . DIABETES MELLITUS, TYPE I, WITH OPHTHALMIC COMPLICATIONS 7/98/9211  . 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 HYPERKALEMIA 06/01/2008   due to DM neparopathy  . HYPERLIPIDEMIA 06/13/2007  . HYPERTENSION 06/13/2007  . NEOPLASM, MALIGNANT, SKIN, FACE 06/01/2008  . Other chronic nonalcoholic liver disease 9/41/7408  . RENAL DISEASE 06/01/2008   Past Surgical History:  Past Surgical History:  Procedure Laterality Date  . Carotid Duplex  09/12/2004  . CATARACT EXTRACTION    . CHOLECYSTECTOMY    . ESOPHAGOGASTRODUODENOSCOPY  06/28/1991  . HERNIA REPAIR     right Inguinal hernia  . Rest/Stress Cardiolite  01/22/2001   HPI:  84 year old male admitted 01/12/18 with confusion. PMH significant for dementia, prostate CA, CVA, DM2, HTN, HLD, CKD, ASH, LBBB   Assessment / Plan / Recommendation Clinical Impression  Pt presents with adequate oral motor strength and function, although missing some dentition per son. No overt s/s aspiration observed or reported on any consistency tested. Pt voice quality is notably hoarse, which son attributes to "a lot of loud talking recently". Pt was encouraged to maintain proper hydration and limit caffeine intake. If hoarseness does not improve in a timely fashion, recommend further work up to rule out infection or vocal fold pathology. Will continue regular diet and thin liquids at this time. Please reconsult if needs arise. RN informed of results and recommendations.     SLP Visit Diagnosis: Dysphagia, unspecified (R13.10)    Aspiration Risk  Mild  aspiration risk    Diet Recommendation Regular;Thin liquid   Liquid Administration via: Cup;Straw Medication Administration: Whole meds with liquid Supervision: Patient able to self feed Compensations: Minimize environmental distractions;Slow rate;Small sips/bites Postural Changes: Seated upright at 90 degrees    Other  Recommendations Recommended Consults: Consider ENT evaluation(if hoarseness does not improve) Oral Care Recommendations: Oral care BID   Follow up Recommendations None             Prognosis Prognosis for Safe Diet Advancement: Good      Swallow Study   General Date of Onset: 01/12/18 HPI: 84 year old male admitted 01/12/18 with confusion. PMH significant for dementia, prostate CA, CVA, DM2, HTN, HLD, CKD, ASH, LBBB Type of Study: Bedside Swallow Evaluation Previous Swallow Assessment: no prior ST intervention Diet Prior to this Study: Regular;Thin liquids Temperature Spikes Noted: No Respiratory Status: Room air History of Recent Intubation: No Behavior/Cognition: Alert;Cooperative;Pleasant mood Oral Cavity Assessment: Within Functional Limits Oral Care Completed by SLP: No Oral Cavity - Dentition: Missing dentition Vision: Functional for self-feeding Self-Feeding Abilities: Able to feed self Patient Positioning: Upright in bed Baseline Vocal Quality: Hoarse Volitional Cough: Strong Volitional Swallow: Able to elicit    Oral/Motor/Sensory Function Overall Oral Motor/Sensory Function: Within functional limits   Ice Chips Ice chips: Not tested   Thin Liquid Thin Liquid: Within functional limits Presentation: Self Fed;Straw    Nectar Thick Nectar Thick Liquid: Not tested   Honey Thick Honey Thick Liquid: Not tested   Puree Puree: Within functional limits Presentation: Spoon   Solid   GO   Solid: Within functional limits  Presentation: [place] Quentin Ore, [facility], CCC-SLP Speech Language Pathologist [phone]  Shonna Chock 01/14/2018,10:01 AM

## 2018-01-14 NOTE — Progress Notes (Addendum)
[facility] PROGRESS NOTE    Progress Note  [name]  Chad Mcmillan:[id number] DOB: 31-Jan-1924 DOA: 01/12/2018 PCP: Chad Mcmillan     Brief Narrative:   Chad Mcmillan is an 84 y.o. male past medical history of dementia, prostate cancer diabetes mellitus type 2 left bundle branch block presents with contusion to the emergency room, as per family members got progressive confusion balance difficulties with repeated falls and significant agitation.  Recently discharged from the hospital and acute encephalopathy  Assessment/Plan:   Acute encephalopathy: Multifactorial in the setting of infectious etiology (possible pneumonia) and new acute renal failure likely due to decreased oral intake.  HCAP: Started empirically on vancomycin and cefepime culture data negative Negative antigens, will de-escalate antibiotic coverage to oral Levaquin.  Acute renal failure on chronic renal disease stage III: Baseline creatinine is 1.7-2.0. In the setting of infectious etiology started on IV fluid hydration. Return to baseline KVO IV fluids.  Advanced dementia with behavioral disturbances: We will start him on oral Aricept. Palliative care to follow-up at facility.  DM (diabetes mellitus) ([place]) We will add long-acting insulin plus sliding scale.   DVT prophylaxis: lovenox Family Communication:none Disposition Plan/Barrier to D/C: SNF in 1 day Code Status:     Code Status Orders  (From admission, onward)        Start     Ordered   01/13/18 0004  Limited resuscitation (code)  Continuous    Question Answer Comment  In the event of cardiac or respiratory ARREST: Initiate Code Blue, Call Rapid Response Yes   In the event of cardiac or respiratory ARREST: Perform CPR Yes   In the event of cardiac or respiratory ARREST: Perform Intubation/Mechanical Ventilation No   In the event of cardiac or respiratory ARREST: Use NIPPV/BiPAp only if indicated Yes   In the event of cardiac or respiratory  ARREST: Administer ACLS medications if indicated Yes   In the event of cardiac or respiratory ARREST: Perform Defibrillation or Cardioversion if indicated Yes      01/13/18 0003    Code Status History    Date Active Date Inactive Code Status Order ID Comments User Context   01/07/2018 22:42 01/09/2018 17:08 Partial Code [id number]  Ivor Costa, [facility]    Advance Directive Documentation     Most Recent Value  Type of Advance Directive  Healthcare Power of Attorney  Pre-existing out of facility DNR order (yellow form or pink MOST form)  No data  "MOST" Form in Place?  No data        IV Access:    Peripheral IV   Procedures and diagnostic studies:   Ct Abdomen Pelvis Wo Contrast  Result Date: 01/12/2018 CLINICAL DATA:  Nausea, vomiting. EXAM: CT ABDOMEN AND PELVIS WITHOUT CONTRAST TECHNIQUE: Multidetector CT imaging of the abdomen and pelvis was performed following the standard protocol without IV contrast. COMPARISON:  None. FINDINGS: Lower chest: Left posterior basilar airspace opacity is noted most consistent with pneumonia. Right middle lobe opacity is also noted concerning for pneumonia. Hepatobiliary: Status post cholecystectomy. Nodular hepatic margins are noted concerning for hepatic cirrhosis. No focal lesion is seen in these unenhanced images. Pancreas: Unremarkable. No pancreatic ductal dilatation or surrounding inflammatory changes. Spleen: Normal in size without focal abnormality. Adrenals/Urinary Tract: Adrenal glands appear normal. Bilateral renal cysts are noted. No hydronephrosis or renal obstruction is noted. No renal or ureteral calculi are noted. Urinary bladder is unremarkable. Stomach/Bowel: The stomach appears normal. The appendix appears normal. Sigmoid diverticulosis is  noted without inflammation. There is no evidence of bowel obstruction. Vascular/Lymphatic: Aortic atherosclerosis. No enlarged abdominal or pelvic lymph nodes. Reproductive: Surgical clips are seen in the  region of the prostate gland. Other: Large right inguinal hernia is noted which contains loops of small bowel, but does not result in incarceration or obstruction. Musculoskeletal: Old L2 compression fracture is noted. No other osseous abnormality is noted. IMPRESSION: Large right inguinal hernia is noted which contains loops of small bowel, but does not result in incarceration or obstruction. Left posterior basilar opacity is noted as well as right middle lobe opacity, concerning for possible pneumonia. Nodular hepatic contours are noted concerning for hepatic cirrhosis. Sigmoid diverticulosis is noted without inflammation. Old L2 compression fracture. Aortic Atherosclerosis (ICD10-I70.0). Electronically Signed   By: Chad Mcmillan, M.D.   On: 01/12/2018 21:46   Dg Chest 2 View  Result Date: 01/12/2018 CLINICAL DATA:  Confusion. EXAM: CHEST  2 VIEW COMPARISON:  Radiographs of January 07, 2018. FINDINGS: The heart size and mediastinal contours are within normal limits. Atherosclerosis of thoracic aorta is noted. Left lung is clear. Mildly increased right middle lobe opacity is noted concerning for worsening atelectasis or infiltrate. The visualized skeletal structures are unremarkable. IMPRESSION: Mildly increased right middle lobe opacity is noted concerning for worsening atelectasis or infiltrate. Aortic Atherosclerosis (ICD10-I70.0). Electronically Signed   By: Chad Mcmillan, M.D.   On: 01/12/2018 16:19   Ct Head Wo Contrast  Result Date: 01/12/2018 CLINICAL DATA:  Altered mental status EXAM: CT HEAD WITHOUT CONTRAST TECHNIQUE: Contiguous axial images were obtained from the base of the skull through the vertex without intravenous contrast. COMPARISON:  Head CT 01/07/2018 FINDINGS: Brain: No mass lesion, intraparenchymal hemorrhage or extra-axial collection. No evidence of acute cortical infarct. There is periventricular hypoattenuation compatible with chronic microvascular disease. Diffuse atrophy,  unchanged. Vascular: Atherosclerotic calcification of the vertebral and internal carotid arteries at the skull base. Skull: Normal visualized skull base, calvarium and extracranial soft tissues. Sinuses/Orbits: No sinus fluid levels or advanced mucosal thickening. No mastoid effusion. Normal orbits. IMPRESSION: Unchanged generalized atrophy and sequelae of chronic ischemic microangiopathy without acute intracranial abnormality. Electronically Signed   By: Ulyses Jarred M.D.   On: 01/12/2018 16:37     Medical Consultants:    None.  Anti-Infectives:   IV vancomycin and cefepime  Subjective:    Koven Belinsky Kassel no complains  Objective:    Vitals:   01/13/18 1441 01/13/18 1608 01/13/18 2154 01/14/18 0544  BP: (!) 167/80 (!) 196/74 (!) 190/76 (!) 166/65  Pulse:  77 82 79  Resp:  16 16 17   Temp:  (!) 97.5 F (36.4 C) 98.1 F (36.7 C) (!) 97.4 F (36.3 C)  TempSrc:  Oral Oral Oral  SpO2:  100% 99% 99%  Weight:      Height:        Intake/Output Summary (Last 24 hours) at 01/14/2018 1118 Last data filed at 01/14/2018 7858 Gross per 24 hour  Intake 740 ml  Output 1925 ml  Net -1185 ml   Filed Weights   01/12/18 1153  Weight: 81.2 kg (179 lb)    Exam: General exam: In no acute distress. Respiratory system: Good air movement and clear to auscultation. Cardiovascular system: S1 & S2 heard, RRR.  Gastrointestinal system: Abdomen is nondistended, soft and nontender.  Extremities: No pedal edema. Skin: No rashes, lesions or ulcers    Data Reviewed:    Labs: Basic Metabolic Panel: Recent Labs  Lab 01/07/18 1551 01/09/18  5956 01/12/18 1248 01/13/18 0453 01/14/18 0606  NA 142 140 139 143  --   K 4.5 4.1 5.1 4.1  --   CL 109 110 109 115*  --   CO2 19* 19* 17* 20*  --   GLUCOSE 150* 134* 264* 136*  --   BUN 58* 46* 61* 44*  --   CREATININE 2.68* 2.20* 2.74* 2.03* 1.78*  CALCIUM 9.1 8.6* 8.8* 8.7*  --   MG  --   --   --  2.1  --   PHOS  --   --   --  3.5  --     GFR Estimated Creatinine Clearance: 30.5 mL/min (A) (by C-G formula based on SCr of 1.78 mg/dL (H)). Liver Function Tests: Recent Labs  Lab 01/12/18 1248 01/13/18 0453  AST 52* 36  ALT 27 23  ALKPHOS 103 96  BILITOT 1.1 1.1  PROT 6.4* 6.1*  ALBUMIN 3.4* 3.2*   No results for input(s): LIPASE, AMYLASE in the last 168 hours. Recent Labs  Lab 01/07/18 2312 01/12/18 1942  AMMONIA 22 11   Coagulation profile No results for input(s): INR, PROTIME in the last 168 hours.  CBC: Recent Labs  Lab 01/07/18 1551 01/08/18 0200 01/12/18 1248 01/13/18 0453  WBC 6.4  --  6.9 4.2  NEUTROABS  --   --  5.3  --   HGB 11.6*  --  10.9* 10.1*  HCT 35.1* 34.1* 32.8* 30.2*  MCV 95.6  --  96.5 97.1  PLT 131*  --  116* 90*   Cardiac Enzymes: No results for input(s): CKTOTAL, CKMB, CKMBINDEX, TROPONINI in the last 168 hours. BNP (last 3 results) No results for input(s): PROBNP in the last 8760 hours. CBG: Recent Labs  Lab 01/13/18 0819 01/13/18 1146 01/13/18 1711 01/13/18 2105 01/14/18 0724  GLUCAP 127* 144* 188* 197* 152*   D-Dimer: No results for input(s): DDIMER in the last 72 hours. Hgb A1c: Recent Labs    01/13/18 0453  HGBA1C 6.5*   Lipid Profile: No results for input(s): CHOL, HDL, LDLCALC, TRIG, CHOLHDL, LDLDIRECT in the last 72 hours. Thyroid function studies: Recent Labs    01/13/18 0453  TSH 3.003   Anemia work up: No results for input(s): VITAMINB12, FOLATE, FERRITIN, TIBC, IRON, RETICCTPCT in the last 72 hours. Sepsis Labs: Recent Labs  Lab 01/07/18 1551 01/12/18 1248 01/13/18 0453  WBC 6.4 6.9 4.2   Microbiology Recent Results (from the past 240 hour(s))  Blood culture (routine x 2)     Status: None (Preliminary result)   Collection Time: 01/12/18  6:15 PM  Result Value Ref Range Status   Specimen Description   Final    BLOOD RIGHT FOREARM Performed at [facility], [place] [address] [place], [place] [address]    Special Requests   Final     BOTTLES DRAWN AEROBIC AND ANAEROBIC Blood Culture adequate volume Performed at [facility] [address] [place], [place] [address]    Culture   Final    NO GROWTH 2 DAYS Performed at [facility] [address] [place], [place] [address]    Report Status PENDING  Incomplete  Blood culture (routine x 2)     Status: None (Preliminary result)   Collection Time: 01/12/18  6:15 PM  Result Value Ref Range Status   Specimen Description   Final    BLOOD LEFT ANTECUBITAL Performed at [facility] [address] [place], [place] [address]  Special Requests   Final    BOTTLES DRAWN AEROBIC AND ANAEROBIC Blood Culture adequate volume Performed at [facility] [address], [place], [place] [address]    Culture   Final    NO GROWTH 2 DAYS Performed at [facility] [address], [place], [place] [address]    Report Status PENDING  Incomplete  MRSA PCR Screening     Status: None   Collection Time: 01/13/18  6:55 AM  Result Value Ref Range Status   MRSA by PCR NEGATIVE NEGATIVE Final    Comment:        The GeneXpert MRSA Assay (FDA approved for NASAL specimens only), is one component of a comprehensive MRSA colonization surveillance program. It is not intended to diagnose MRSA infection nor to guide or monitor treatment for MRSA infections. Performed at [facility], [place] [address], [place], [place] [address]      Medications:   . amLODipine  5 mg Oral Daily  . aspirin EC  81 mg Oral Daily  . guaiFENesin  600 mg Oral BID  . insulin aspart  0-5 Units Subcutaneous QHS  . insulin aspart  0-9 Units Subcutaneous TID WC  . pravastatin  80 mg Oral Daily  . QUEtiapine  25 mg Oral QHS   Continuous Infusions: . ceFEPime (MAXIPIME) IV Stopped (01/13/18 2216)  . vancomycin        LOS: 2 days   Charlynne Cousins  [facility] Pager [phone]  *Please refer to  [email or web address], password [email or web address] to get updated schedule on who will round on this patient, as hospitalists switch teams weekly. If 7PM-7AM, please contact night-coverage at [email or web address], password [email or web address] for any overnight needs.  01/14/2018, 11:18 AM

## 2018-01-14 NOTE — Progress Notes (Signed)
PHARMACY NOTE:  ANTIMICROBIAL RENAL DOSAGE ADJUSTMENT  Current antimicrobial regimen includes a mismatch between antimicrobial dosage and estimated renal function.  As per policy approved by the Pharmacy & Therapeutics and Medical Executive Committees, the antimicrobial dosage will be adjusted accordingly.  Current antimicrobial dosage:  levofloxacin  Indication: pneumonia  Renal Function:  Estimated Creatinine Clearance: 30.5 mL/min (A) (by C-G formula based on SCr of 1.78 mg/dL (H)). []      On intermittent HD, scheduled: []      On CRRT    Antimicrobial dosage has been changed to:  Levofloxacin 555m po x 1 then 259mpo daily. Time 1st dose to be  24h from last cefepime dose  Additional comments:   Thank you for allowing pharmacy to be a part of this patient's care.  DuDoreene ElandPharmD, BCPS.   Pager: [phone]/27/2019 11:53 AM

## 2018-01-14 NOTE — Evaluation (Signed)
Physical Therapy Evaluation Patient Details Name: Chad Mcmillan MRN: [id number] DOB: 02-14-1924 Today's Date: 01/14/2018   History of Present Illness  84 yo male admitted with Pna, falls, agitation, acute encephalopathy. Hx of HTN, DM, dementia, prostate ca, LBBB  Clinical Impression  On eval, pt required Mod assist to stand and Min assist +2 for safety for ambulation. He walked ~75 feet with a RW. Pt presents with general weakness, decreased activity tolerance, and impaired gait and balance. He is at risk for falls without assistance. Mod cueing required during session. No family present. Recommend SNF at this time. Will follow and progress activity as tolerated.     Follow Up Recommendations SNF    Equipment Recommendations  None recommended by PT    Recommendations for Other Services       Precautions / Restrictions Precautions Precautions: Fall Precaution Comments: dizziness Restrictions Weight Bearing Restrictions: No      Mobility  Bed Mobility Overal bed mobility: Needs Assistance Bed Mobility: Supine to Sit     Supine to sit: Min guard;HOB elevated     General bed mobility comments: Pt used bedrail. Increased time and effort  Transfers Overall transfer level: Needs assistance Equipment used: Rolling walker (2 wheeled) Transfers: Sit to/from Stand Sit to Stand: From elevated surface;Mod assist         General transfer comment: Pt attempted stand x 2 without assistance-he was unable. On 3rd attempt, therapist assisted with weigth shift and boost. Cues for safety, technique, hand placement.   Ambulation/Gait Ambulation/Gait assistance: Min assist;+2 safety/equipment Ambulation Distance (Feet): 75 Feet Assistive device: Rolling walker (2 wheeled) Gait Pattern/deviations: Wide base of support;Shuffle;Trunk flexed     General Gait Details: Assist to stabilize pt throughout distance. Mod cues for posture, distance from RW, step length. Pt was fatigued after  walk. Definite risk for falls without assistance  Stairs            Wheelchair Mobility    Modified Rankin (Stroke Patients Only)       Balance Overall balance assessment: Needs assistance         Standing balance support: Bilateral upper extremity supported Standing balance-Leahy Scale: Poor                               Pertinent Vitals/Pain Pain Assessment: No/denies pain    Home Living Family/patient expects to be discharged to:: Skilled nursing facility Living Arrangements: Spouse/significant other Available Help at Discharge: Family;Available 24 hours/day Type of Home: House Home Access: Ramped entrance     Home Layout: One level Home Equipment: Walker - 4 wheels;Cane - single point;Toilet riser Additional Comments: info taken from previous recent admission    Prior Function Level of Independence: Needs assistance   Gait / Transfers Assistance Needed: Uses SPC for household ambulation intermittently; [place] "[employer]"just recently got a rollator.  ADL's / Homemaking Assistance Needed: reports independence for ADLs. Wife does some IADLs. Son helps with grocery shopping and driving. sON STATES HIS DAD COMPLETES HIS OWN BATHING BUT EXPRESSES CONCERN FOR HIS ABILITY TO THOUROUGHLY CLEAN HIMSELF.        Hand Dominance        Extremity/Trunk Assessment   Upper Extremity Assessment Upper Extremity Assessment: Generalized weakness    Lower Extremity Assessment Lower Extremity Assessment: Generalized weakness    Cervical / Trunk Assessment Cervical / Trunk Assessment: Kyphotic  Communication   Communication: HOH  Cognition Arousal/Alertness: Awake/alert  Behavior During Therapy: WFL for tasks assessed/performed Overall Cognitive Status: History of cognitive impairments - at baseline                                        General Comments      Exercises     Assessment/Plan    PT Assessment  Patient needs continued PT services  PT Problem List Decreased strength;Decreased balance;Decreased cognition;Decreased mobility;Decreased knowledge of use of DME;Decreased activity tolerance;Decreased safety awareness       PT Treatment Interventions DME instruction;Gait training;Functional mobility training;Therapeutic activities;Balance training;Patient/family education;Therapeutic exercise    PT Goals (Current goals can be found in the Care Plan section)  Acute Rehab PT Goals Patient Stated Goal: none stated PT Goal Formulation: With patient Time For Goal Achievement: 01/28/18 Potential to Achieve Goals: Fair    Frequency Min 2X/week   Barriers to discharge        Co-evaluation               AM-PAC PT "6 Clicks" Daily Activity  Outcome Measure Difficulty turning over in bed (including adjusting bedclothes, sheets and blankets)?: A Lot Difficulty moving from lying on back to sitting on the side of the bed? : A Lot Difficulty sitting down on and standing up from a chair with arms (e.g., wheelchair, bedside commode, etc,.)?: Unable Help needed moving to and from a bed to chair (including a wheelchair)?: A Lot Help needed walking in hospital room?: A Lot Help needed climbing 3-5 steps with a railing? : Total 6 Click Score: 10    End of Session Equipment Utilized During Treatment: Gait belt Activity Tolerance: Patient limited by fatigue Patient left: in chair;with nursing/sitter in room;with call bell/phone within reach(sitter in room)   PT Visit Diagnosis: Muscle weakness (generalized) (M62.81);Difficulty in walking, not elsewhere classified (R26.2);Unsteadiness on feet (R26.81)    Time: 0932-6712 PT Time Calculation (min) (ACUTE ONLY): 18 min   Charges:   PT Evaluation $PT Eval Moderate Complexity: 1 Mod     PT G Codes:          Weston Anna, MPT Pager: [phone]

## 2018-01-14 NOTE — Consult Note (Addendum)
Consultation Note Date: 01/14/18  Patient Name: Chad Mcmillan  DOB: October 30, 1924  MRN: [id number]  Age / Sex: 84 y.o., male  PCP: [name], MD Referring Physician: Aileen Fass, Tammi Klippel, MD  Reason for Consultation: Establishing goals of care  HPI/Patient Profile: 84 y.o. male  with past medical history of dementia, CVA, CAD, HTN, HLD prostate adenocarcinoma, renal disease, non-alcoholic liver disease admitted on 01/12/2018 with confusion. Recently admitted for confusion. MRI and CT head negative. Chest xray reveals pneumonia. Patient c/o of scrotal pain and with hx of inguinal hernia-reduced in ED. CT abdomen negative for incarceration. No surgical intervention indicated. Baseline dementia. Started on Seroquel and Aricept. Palliative medicine consultation for goals of care.    Clinical Assessment and Goals of Care: I have reviewed medical records, discussed with care team, and met with two sons Louie Casa and [name]) in family conference room to discuss diagnosis, [place], EOL wishes, disposition and options.  Patient is awake, alert, pleasantly confused with baseline dementia. He is worried about right inguinal hernia and continues to talk about needing surgery. I explained that a CT was performed and surgical intervention is not necessary. Sons tell me he becomes more agitated when the hernia is bothering him. The patient denies pain.   Introduced Palliative Medicine as specialized medical care for people living with serious illness. It focuses on providing relief from the symptoms and stress of a serious illness. The goal is to improve quality of life for both the patient and the family.  We discussed a brief life review of the patient. Prior to hospitalization, patient living home with wife of 60+ years. He has been her primary caregiver since she experienced a stroke 10 years ago. Louie Casa and [name] are only children. They are  very involved and supportive. It has been important to keep their parents in their home but they are requiring more assist. Louie Casa speaks of his dad refusing hired caregivers and very stubborn. He is afraid of being taken from his home. He has never actually been diagnosed with dementia until this admission, but sons speak of his declining cognitive status in the last year. He is weak and unsteady. He uses a walker. Great appetite.   Discussed hospital diagnoses and interventions. Educated on disease trajectory and expectations of advancing dementia. We discussed Seroquel helping with agitation. They understand dementia is a chronic, progressive disease.   Louie Casa inquires what to do if his dad is not able to stay home. We discussed memory care units or skilled nursing care that may be a safe environment for him with advancing dementia. I explained that social workers at rehab will help with disposition plan after rehab.   Louie Casa and [name] are most worried about his balance and want his "inner ear" checked. They have recently been referred to outpatient neuro-rehab but their dad refused to go. They are hopeful that he will gain strength at short-term rehab and be able to return home.   Palliative Care services outpatient were explained and offered. Sons ask about hospice. I  explained that he is not yet eligible for hospice services but that this can be great support for them in the future and when prognosis is 6 months or less. Encouraged sons to reach out to PCP for outpatient palliative care referral. They are agreeable.   Questions and concerns were addressed. PMT contact information given.    SUMMARY OF RECOMMENDATIONS    Initial palliative care discussion. Educated on disease trajectory and expectations of progressive dementia.   Remains limited code. Will need further discussion/education on advanced directives.   Sons hopeful for discharge to SNF for short-term rehab and return  home.  Encouraged sons to contact PCP for outpatient palliative referral.  Sons concerned about having inner ear checked? Will discuss with attending.   Code Status/Advance Care Planning:  Limited code  Symptom Management:   Agree with Seroquel HS and prn Haldol  Palliative Prophylaxis:   Aspiration, Delirium Protocol, Oral Care and Turn Reposition  Additional Recommendations (Limitations, Scope, Preferences):  Full Scope Treatment  Psycho-social/Spiritual:   Desire for further Chaplaincy support:yes  Additional Recommendations: Caregiving  Support/Resources and Education on Hospice  Prognosis:   Unable to determine  Discharge Planning: [facility] for rehab with Palliative care service follow-up      Primary Diagnoses: Present on Admission: . Acute encephalopathy . NASH (nonalcoholic steatohepatitis) . HCAP (healthcare-associated pneumonia) . AKI (acute kidney injury) ([place]) . Dehydration   I have reviewed the medical record, interviewed the patient and family, and examined the patient. The following aspects are pertinent.  Past Medical History:  Diagnosis Date  . ADENOCARCINOMA, PROSTATE 06/01/2008  . CORONARY ARTERY DISEASE 06/13/2007  . CVA 06/01/2008   x 2  . DIABETES MELLITUS, TYPE I, WITH OPHTHALMIC COMPLICATIONS 7/41/6384  . 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 HYPERKALEMIA 06/01/2008   due to DM neparopathy  . HYPERLIPIDEMIA 06/13/2007  . HYPERTENSION 06/13/2007  . NEOPLASM, MALIGNANT, SKIN, FACE 06/01/2008  . Other chronic nonalcoholic liver disease 5/36/4680  . RENAL DISEASE 06/01/2008   Social History   Socioeconomic History  . Marital status: Married    Spouse name: None  . Number of children: None  . Years of education: None  . Highest education level: None  Social Needs  . Financial resource strain: None  . Food insecurity - worry: None  . Food insecurity - inability: None  . Transportation needs - medical: None  . Transportation  needs - non-medical: None  Occupational History  . Occupation: Retired    Fish farm manager: RETIRED  Tobacco Use  . Smoking status: Former Smoker    Last attempt to quit: 11/18/1978    Years since quitting: 39.1  . Smokeless tobacco: Never Used  Substance and Sexual Activity  . Alcohol use: No  . Drug use: No  . Sexual activity: No    Birth control/protection: None  Other Topics Concern  . None  Social History Narrative  . None   History reviewed. No pertinent family history. Scheduled Meds: . amLODipine  5 mg Oral Daily  . aspirin EC  81 mg Oral Daily  . donepezil  5 mg Oral QHS  . guaiFENesin  600 mg Oral BID  . insulin aspart  0-5 Units Subcutaneous QHS  . insulin aspart  0-9 Units Subcutaneous TID WC  . insulin detemir  5 Units Subcutaneous BID  . levofloxacin  250 mg Oral QHS  . pravastatin  80 mg Oral Daily  . QUEtiapine  25 mg Oral QHS   Continuous Infusions: PRN Meds:.acetaminophen **OR** acetaminophen,  albuterol, haloperidol lactate, HYDROcodone-acetaminophen, LORazepam, ondansetron **OR** ondansetron (ZOFRAN) IV, polyvinyl alcohol Medications Prior to Admission:  Prior to Admission medications   Medication Sig Start Date End Date Taking? Authorizing Provider  Acetaminophen 500 MG coapsule Take 500-1,000 mg by mouth every 6 (six) hours as needed for headache (pain).   Yes [provider]  amLODipine (NORVASC) 5 MG tablet Take 1 tablet (5 mg total) by mouth daily. 01/09/18  Yes Domenic Polite, MD  aspirin EC 81 MG tablet Take 1 tablet (81 mg total) by mouth daily. 08/04/17  Yes [name], MD  insulin lispro (HUMALOG) 100 UNIT/ML injection 3 times a day (just before each meal), 15-10-10 units Patient taking differently: Inject 3-25 Units into the skin once. 3 times a day (just before each meal), 15-10-10 units 10/07/17  Yes [name], MD  insulin NPH Human (HUMULIN N) 100 UNIT/ML injection Inject 0.05 mLs (5 Units total) into the skin at bedtime. 07/07/17  Yes  [name], MD  meclizine (ANTIVERT) 12.5 MG tablet Take 1 tablet (12.5 mg total) by mouth 3 (three) times daily as needed for dizziness. Patient taking differently: Take 12.5 mg by mouth 3 (three) times daily.  11/20/17  Yes [name], MD  pravastatin (PRAVACHOL) 80 MG tablet TAKE 1 TABLET BY MOUTH ONCE DAILY Patient taking differently: TAKE 80 mg  TABLET BY MOUTH ONCE DAILY 11/24/17  Yes [name], MD  nitroGLYCERIN (NITROSTAT) 0.4 MG SL tablet Place 1 tablet (0.4 mg total) under the tongue every 5 (five) minutes as needed. For chest pain 08/04/17   [name], MD   No Known Allergies Review of Systems  Unable to perform ROS: Dementia   Physical Exam  Constitutional: He is cooperative. He appears ill.  HENT:  Head: Normocephalic and atraumatic.  Cardiovascular: Regular rhythm.  Pulmonary/Chest: No accessory muscle usage. No tachypnea. No respiratory distress.  Abdominal: A hernia is present. Hernia confirmed positive in the right inguinal area.  Neurological: He is alert.  Pleasantly confused with baseline dementia  Skin: Skin is warm and dry.  Psychiatric: His speech is delayed. Cognition and memory are impaired.  Irritable  Nursing note and vitals reviewed.  Vital Signs: BP (!) 144/71 (BP Location: Right Arm)   Pulse 64   Temp (!) 97.2 F (36.2 C) (Oral)   Resp 18   Ht '6\' 1"'  (1.854 m)   Wt 81.2 kg (179 lb)   SpO2 100%   BMI 23.62 kg/m  Pain Assessment: No/denies pain   Pain Score: 0-No pain  SpO2: SpO2: 100 % O2 Device:SpO2: 100 % O2 Flow Rate: .   IO: Intake/output summary:   Intake/Output Summary (Last 24 hours) at 01/15/2018 6301 Last data filed at 01/15/2018 0600 Gross per 24 hour  Intake 360 ml  Output 1075 ml  Net -715 ml    LBM: Last BM Date: 01/12/18 Baseline Weight: Weight: 81.2 kg (179 lb) Most recent weight: Weight: 81.2 kg (179 lb)     Palliative Assessment/Data: PPS 50%   Flowsheet Rows     Most Recent Value  Intake Tab  Referral  Department  Hospitalist  Unit at Time of Referral  Med/Surg Unit  Palliative Care Primary Diagnosis  Neurology  Palliative Care Type  New Palliative care  Reason for referral  Clarify Goals of Care  Date first seen by Palliative Care  01/14/18  Clinical Assessment  Palliative Performance Scale Score  50%  Psychosocial & Spiritual Assessment  Palliative Care Outcomes  Patient/Family meeting held?  Yes  Who was at the meeting?  sons Louie Casa and [name])  Palliative Care Outcomes  Clarified goals of care, Provided psychosocial or spiritual support, ACP counseling assistance, Counseled regarding hospice, Linked to palliative care logitudinal support      Time In: 1500 Time Out: 1630 Time Total: 71mn Greater than 50%  of this time was spent counseling and coordinating care related to the above assessment and plan.  Signed by:  MIhor Dow FNP-C Palliative Medicine Team  Phone: [phone]: [phone]  Please contact Palliative Medicine Team phone at [phone] questions and concerns.  For individual provider: See AShea Evans

## 2018-01-15 DIAGNOSIS — G92 Toxic encephalopathy: Secondary | ICD-10-CM

## 2018-01-15 DIAGNOSIS — Z515 Encounter for palliative care: Secondary | ICD-10-CM

## 2018-01-15 DIAGNOSIS — F0390 Unspecified dementia without behavioral disturbance: Secondary | ICD-10-CM

## 2018-01-15 DIAGNOSIS — R451 Restlessness and agitation: Secondary | ICD-10-CM

## 2018-01-15 LAB — BASIC METABOLIC PANEL
Anion gap: 6 (ref 5–15)
BUN: 36 mg/dL — AB (ref 6–20)
CO2: 22 mmol/L (ref 22–32)
CREATININE: 1.95 mg/dL — AB (ref 0.61–1.24)
Calcium: 8.8 mg/dL — ABNORMAL LOW (ref 8.9–10.3)
Chloride: 113 mmol/L — ABNORMAL HIGH (ref 101–111)
GFR calc Af Amer: 33 mL/min — ABNORMAL LOW (ref >60)
GFR, EST NON AFRICAN AMERICAN: 28 mL/min — AB (ref >60)
Glucose, Bld: 150 mg/dL — ABNORMAL HIGH (ref 65–99)
Potassium: 4.4 mmol/L (ref 3.5–5.1)
SODIUM: 141 mmol/L (ref 135–145)

## 2018-01-15 LAB — GLUCOSE, CAPILLARY
GLUCOSE-CAPILLARY: 119 mg/dL — AB (ref 65–99)
Glucose-Capillary: 203 mg/dL — ABNORMAL HIGH (ref 65–99)
Glucose-Capillary: 203 mg/dL — ABNORMAL HIGH (ref 65–99)
Glucose-Capillary: 182 mg/dL — ABNORMAL HIGH (ref 65–99)

## 2018-01-15 MED ORDER — LEVOFLOXACIN 250 MG PO TABS: 250.0000 mg | ORAL_TABLET | Freq: Every day | ORAL | 0 refills | Status: DC

## 2018-01-15 MED ORDER — DONEPEZIL HCL 5 MG PO TABS: 5.0000 mg | ORAL_TABLET | Freq: Every day | ORAL | Status: DC

## 2018-01-15 NOTE — Progress Notes (Signed)
Patient chose bed at [facility].   Facility will have bed available for patient tomorrow.   LCSW will continue to follow.  Carolin Coy [facility] [place]

## 2018-01-15 NOTE — Care Management Note (Signed)
Case Management Note  Patient Details  Name: LANEY BAGSHAW MRN: [id number] Date of Birth: Jul 16, 1924  Subjective/Objective:                    Action/Plan: pt discharging to SNF.   Expected Discharge Date:  01/15/18               Expected Discharge Plan:  Skilled Nursing Facility  In-House Referral:  Clinical Social Work  Discharge planning Services  CM Consult  Post Acute Care Choice:    Choice offered to:  Patient  DME Arranged:    DME Agency:     HH Arranged:    [place] Agency:     Status of Service:  Completed, signed off  If discussed at [employer], dates discussed:    Additional CommentsPurcell Mouton, RN 01/15/2018, 12:51 PM

## 2018-01-15 NOTE — Progress Notes (Signed)
Nutrition Brief Note  Pt admitted for acute encephalopathy likely secondary to infection/dehydration.  RD consulted for nutrition assessment. Pt to be discharge to skilled nursing facility today. D/C summary put in 2/28 at 9:08 am. Will monitor for needs if pt remains in the hospital tomorrow.   Mariana Single RD, LDN Clinical Nutrition Pager # [phone]

## 2018-01-15 NOTE — Care Management Important Message (Signed)
Important Message  Patient Details  Name: ABSALOM ARO MRN: [id number] Date of Birth: 10/08/1924   Medicare Important Message Given:  Yes    Kerin Salen 01/15/2018, 10:30 AMImportant Message  Patient Details  Name: FOXX KLARICH MRN: [id number] Date of Birth: November 08, 1924   Medicare Important Message Given:  Yes    Kerin Salen 01/15/2018, 10:29 AM

## 2018-01-15 NOTE — Discharge Summary (Signed)
Physician Discharge Summary  Chad Mcmillan WPY:[id number] DOB: 01-23-1924 DOA: 01/12/2018  PCP: [name], MD  Admit date: 01/12/2018 Discharge date: 01/15/2018  Admitted From: Home Disposition:  SNF  Recommendations for Outpatient Follow-up:  1. Follow up with PCP in 1-2 weeks 2. Please obtain BMP/CBC in one week   Home Health:No Equipment/Devices:none  Discharge Condition:stable CODE STATUS:full Diet recommendation: Heart Healthy  Brief/Interim Summary: 84 y.o. male past medical history of dementia, prostate cancer diabetes mellitus type 2 left bundle branch block presents with contusion to the emergency room, as per family members got progressive confusion balance difficulties with repeated falls and significant agitation.  Recently discharged from the hospital and acute encephalopathy  Discharge Diagnoses:  Active Problems:   NASH (nonalcoholic steatohepatitis)   DM (diabetes mellitus) ([place])   Dehydration   Acute encephalopathy   HCAP (healthcare-associated pneumonia)   AKI (acute kidney injury) ([place])   Inguinal hernia   CKD (chronic kidney disease), stage III ([facility])  Acute toxic encephalopathy: Multifactorial in the setting of infectious etiology and acute renal failure.  Resolved  Healthcare associated pneumonia Empirically on IV vancomycin and cefepime. Culture data remain negative, antigens. Changed to oral Levaquin which she will continue as an outpatient  Acute kidney injury on chronic renal disease stage III: Reason creatinine 0. Likely prerenal etiology resolved with IV fluid hydration.  Advanced dementia with behavioral disturbances: as per family members more forgetful, he does have dementia. We will start a multiple reevaluate in 6 weeks by PCP.  Diabetes mellitus type 2 no changes were made.  Physical therapy evaluated the patient the recommended skilled nursing facility. Discharge Instructions  Discharge Instructions    Diet - low sodium heart  healthy   Complete by:  As directed    Increase activity slowly   Complete by:  As directed      Allergies as of 01/15/2018   No Known Allergies     Medication List    STOP taking these medications   meclizine 12.5 MG tablet Commonly known as:  ANTIVERT     TAKE these medications   Acetaminophen 500 MG coapsule Take 500-1,000 mg by mouth every 6 (six) hours as needed for headache (pain).   amLODipine 5 MG tablet Commonly known as:  NORVASC Take 1 tablet (5 mg total) by mouth daily.   aspirin EC 81 MG tablet Take 1 tablet (81 mg total) by mouth daily.   donepezil 5 MG tablet Commonly known as:  ARICEPT Take 1 tablet (5 mg total) by mouth at bedtime.   insulin lispro 100 UNIT/ML injection Commonly known as:  HUMALOG 3 times a day (just before each meal), 15-10-10 units What changed:    how much to take  how to take this  when to take this  additional instructions   insulin NPH Human 100 UNIT/ML injection Commonly known as:  HUMULIN N Inject 0.05 mLs (5 Units total) into the skin at bedtime.   levofloxacin 250 MG tablet Commonly known as:  LEVAQUIN Take 1 tablet (250 mg total) by mouth at bedtime.   nitroGLYCERIN 0.4 MG SL tablet Commonly known as:  NITROSTAT Place 1 tablet (0.4 mg total) under the tongue every 5 (five) minutes as needed. For chest pain   pravastatin 80 MG tablet Commonly known as:  PRAVACHOL TAKE 1 TABLET BY MOUTH ONCE DAILY What changed:    how much to take  how to take this  when to take this       No Known  Allergies  Consultations:  None   Procedures/Studies: Ct Abdomen Pelvis Wo Contrast  Result Date: 01/12/2018 CLINICAL DATA:  Nausea, vomiting. EXAM: CT ABDOMEN AND PELVIS WITHOUT CONTRAST TECHNIQUE: Multidetector CT imaging of the abdomen and pelvis was performed following the standard protocol without IV contrast. COMPARISON:  None. FINDINGS: Lower chest: Left posterior basilar airspace opacity is noted most  consistent with pneumonia. Right middle lobe opacity is also noted concerning for pneumonia. Hepatobiliary: Status post cholecystectomy. Nodular hepatic margins are noted concerning for hepatic cirrhosis. No focal lesion is seen in these unenhanced images. Pancreas: Unremarkable. No pancreatic ductal dilatation or surrounding inflammatory changes. Spleen: Normal in size without focal abnormality. Adrenals/Urinary Tract: Adrenal glands appear normal. Bilateral renal cysts are noted. No hydronephrosis or renal obstruction is noted. No renal or ureteral calculi are noted. Urinary bladder is unremarkable. Stomach/Bowel: The stomach appears normal. The appendix appears normal. Sigmoid diverticulosis is noted without inflammation. There is no evidence of bowel obstruction. Vascular/Lymphatic: Aortic atherosclerosis. No enlarged abdominal or pelvic lymph nodes. Reproductive: Surgical clips are seen in the region of the prostate gland. Other: Large right inguinal hernia is noted which contains loops of small bowel, but does not result in incarceration or obstruction. Musculoskeletal: Old L2 compression fracture is noted. No other osseous abnormality is noted. IMPRESSION: Large right inguinal hernia is noted which contains loops of small bowel, but does not result in incarceration or obstruction. Left posterior basilar opacity is noted as well as right middle lobe opacity, concerning for possible pneumonia. Nodular hepatic contours are noted concerning for hepatic cirrhosis. Sigmoid diverticulosis is noted without inflammation. Old L2 compression fracture. Aortic Atherosclerosis (ICD10-I70.0). Electronically Signed   By: Marijo Conception, M.D.   On: 01/12/2018 21:46   Dg Chest 2 View  Result Date: 01/12/2018 CLINICAL DATA:  Confusion. EXAM: CHEST  2 VIEW COMPARISON:  Radiographs of January 07, 2018. FINDINGS: The heart size and mediastinal contours are within normal limits. Atherosclerosis of thoracic aorta is noted.  Left lung is clear. Mildly increased right middle lobe opacity is noted concerning for worsening atelectasis or infiltrate. The visualized skeletal structures are unremarkable. IMPRESSION: Mildly increased right middle lobe opacity is noted concerning for worsening atelectasis or infiltrate. Aortic Atherosclerosis (ICD10-I70.0). Electronically Signed   By: Marijo Conception, M.D.   On: 01/12/2018 16:19   Dg Chest 2 View  Result Date: 12/30/2017 CLINICAL DATA:  Vertigo EXAM: CHEST  2 VIEW COMPARISON:  04/23/2016 FINDINGS: Heart is top-normal in size. There is moderate aortic atherosclerosis along the transverse portion without aneurysm. Patchy airspace opacities are redemonstrated in the right middle lobe distribution compatible with pneumonia. Blunting the costophrenic angles compatible small pleural effusions. No overt pulmonary edema. Emphysematous hyperinflation of the lungs upper lobe predominant. No acute osseous abnormality. Degenerative changes are seen along the dorsal spine with osteopenia. IMPRESSION: 1. Redemonstration of right middle lobe airspace opacities suspicious for pneumonia and atelectasis. 2. Moderate aortic atherosclerosis. 3. Emphysema. Electronically Signed   By: Ashley Royalty M.D.   On: 12/30/2017 21:17   Ct Head Wo Contrast  Result Date: 01/12/2018 CLINICAL DATA:  Altered mental status EXAM: CT HEAD WITHOUT CONTRAST TECHNIQUE: Contiguous axial images were obtained from the base of the skull through the vertex without intravenous contrast. COMPARISON:  Head CT 01/07/2018 FINDINGS: Brain: No mass lesion, intraparenchymal hemorrhage or extra-axial collection. No evidence of acute cortical infarct. There is periventricular hypoattenuation compatible with chronic microvascular disease. Diffuse atrophy, unchanged. Vascular: Atherosclerotic calcification of the vertebral and internal carotid arteries  at the skull base. Skull: Normal visualized skull base, calvarium and extracranial soft  tissues. Sinuses/Orbits: No sinus fluid levels or advanced mucosal thickening. No mastoid effusion. Normal orbits. IMPRESSION: Unchanged generalized atrophy and sequelae of chronic ischemic microangiopathy without acute intracranial abnormality. Electronically Signed   By: Ulyses Jarred M.D.   On: 01/12/2018 16:37   Ct Head Wo Contrast  Result Date: 01/07/2018 CLINICAL DATA:  Altered level of consciousness. EXAM: CT HEAD WITHOUT CONTRAST TECHNIQUE: Contiguous axial images were obtained from the base of the skull through the vertex without intravenous contrast. COMPARISON:  Brain MRI 12/30/2017.  Head CT 11/15/2017 FINDINGS: Brain: Generalized atrophy unchanged from prior exam. Cerebral and cerebellar chronic small vessel ischemia, also unchanged. No intracranial hemorrhage, mass effect, or midline shift. No hydrocephalus. The basilar cisterns are patent. No evidence of territorial infarct or acute ischemia. No extra-axial or intracranial fluid collection. Vascular: Atherosclerosis of skullbase vasculature without hyperdense vessel or abnormal calcification. Skull: No fracture or focal lesion. Sinuses/Orbits: Mucosal thickening in the right maxillary sinus, chronic. No acute finding. Other: None. IMPRESSION: 1.  No acute intracranial abnormality. 2. Unchanged atrophy and chronic small vessel ischemia. Electronically Signed   By: Jeb Levering M.D.   On: 01/07/2018 23:13   Mr Angiogram Head Wo Contrast  Result Date: 12/30/2017 CLINICAL DATA:  84 year old male with persistent vertigo, with progressive symptoms recently. Difficulty walking. Occasional double vision. EXAM: MRI HEAD WITHOUT CONTRAST MRA HEAD WITHOUT CONTRAST MRA NECK WITHOUT CONTRAST TECHNIQUE: Multiplanar, multiecho pulse sequences of the brain and surrounding structures were obtained without intravenous contrast. Angiographic images of the Circle of Willis were obtained using MRA technique without intravenous contrast. Angiographic images of  the neck were obtained using MRA technique without intravenous contrast. Carotid stenosis measurements (when applicable) are obtained utilizing NASCET criteria, using the distal internal carotid diameter as the denominator. COMPARISON:  Head CTs without contrast 11/15/2017 and earlier. Brain MRI 02/15/2016. Neck MRA 10/26/2004. FINDINGS: MRI HEAD FINDINGS Brain: Cerebral volume is not significantly changed since 2017. No restricted diffusion to suggest acute infarction. No midline shift, mass effect, evidence of mass lesion, ventriculomegaly, extra-axial collection or acute intracranial hemorrhage. Cervicomedullary junction and pituitary are within normal limits. Small chronic infarcts in both cerebellar hemispheres are stable. Chronic lacunar infarcts suspected in the central pons with associated chronic T2 hyperintensity. Minimal to mild for age superimposed chronic T2 heterogeneity in the deep gray matter nuclei. Mild to moderate for age patchy bilateral cerebral white matter T2 and FLAIR hyperintensity, mostly periventricular, has not significantly progressed. No cerebral cortical encephalomalacia or chronic cerebral blood products identified. Vascular: Major intracranial vascular flow voids are stable since 2017, the distal right vertebral artery demonstrates chronic slow flow or occlusion, see below. Dominant appearing distal left vertebral artery. Stable anterior circulation flow voids. Skull and upper cervical spine: Negative visible cervical spine. Normal bone marrow signal. Sinuses/Orbits: Combined fluid level and inspissated material within the right maxillary sinus is new since 2017. Other paranasal sinuses remain well pneumatized. Stable orbits soft tissues. Other: Visible internal auditory structures appear normal. Mastoid air cells remain clear. Scalp and face soft tissues appear negative. MRA NECK FINDINGS Time-of-flight neck MRA reveals antegrade flow in both cervical carotid arteries to the skull  base. Irregularity at the carotid bifurcations is greater on the right, although no proximal right ICA stenosis is estimated at less than 50 % with respect to the distal vessel. Dominant appearance of the left vertebral artery throughout the neck is similar to the 2005 study. Antegrade  flow signal in the smaller cervical right vertebral artery is faint but present. MRA HEAD FINDINGS There is flow signal in the distal right vertebral artery at the origin of the right PICA. There is severe irregularity and stenosis of the right V4 segment distal to the PICA (series 406, image 14). Normal flow signal in the dominant distal left vertebral artery which appears to supply the basilar. Patent basilar artery without stenosis. Patent AICA and SCA origins. Right posterior communicating artery is present while the left is diminutive or absent. There is only mild irregularity of the bilateral PCA branches which demonstrate symmetric distal flow signal. Antegrade flow in both ICA siphons. There is moderate bilateral ICA siphon irregularity compatible with atherosclerosis. Mild-to-moderate bilateral supraclinoid ICA segment stenosis results. Ophthalmic artery origins appear normal. Patent carotid termini. Normal MCA and ACA origins. Anterior communicating artery and visible ACA branches are within normal limits. Bilateral MCA M1 segments and MCA bifurcations appear normal. Visible bilateral MCA branches appear normal. IMPRESSION: 1.  No acute intracranial abnormality. 2. Stable noncontrast MRI appearance of the brain since 2017, notable for chronic small vessel disease primarily in the cerebellum and pons. 3. Severe chronic atherosclerosis and stenosis of the distal right vertebral artery, although the right PICA origin remains patent. 4. Dominant left vertebral artery without stenosis, and otherwise negative posterior circulation. 5. Right greater than left carotid bifurcation, and bilateral ICA siphon atherosclerosis. But no  definite hemodynamically significant carotid stenosis. And otherwise negative anterior circulation. 6. Acute on chronic right maxillary sinus inflammation is new since 2017. Electronically Signed   By: Genevie Ann M.D.   On: 12/30/2017 18:59   Mr Jodene Nam Neck Wo Contrast  Result Date: 12/30/2017 CLINICAL DATA:  84 year old male with persistent vertigo, with progressive symptoms recently. Difficulty walking. Occasional double vision. EXAM: MRI HEAD WITHOUT CONTRAST MRA HEAD WITHOUT CONTRAST MRA NECK WITHOUT CONTRAST TECHNIQUE: Multiplanar, multiecho pulse sequences of the brain and surrounding structures were obtained without intravenous contrast. Angiographic images of the Circle of Willis were obtained using MRA technique without intravenous contrast. Angiographic images of the neck were obtained using MRA technique without intravenous contrast. Carotid stenosis measurements (when applicable) are obtained utilizing NASCET criteria, using the distal internal carotid diameter as the denominator. COMPARISON:  Head CTs without contrast 11/15/2017 and earlier. Brain MRI 02/15/2016. Neck MRA 10/26/2004. FINDINGS: MRI HEAD FINDINGS Brain: Cerebral volume is not significantly changed since 2017. No restricted diffusion to suggest acute infarction. No midline shift, mass effect, evidence of mass lesion, ventriculomegaly, extra-axial collection or acute intracranial hemorrhage. Cervicomedullary junction and pituitary are within normal limits. Small chronic infarcts in both cerebellar hemispheres are stable. Chronic lacunar infarcts suspected in the central pons with associated chronic T2 hyperintensity. Minimal to mild for age superimposed chronic T2 heterogeneity in the deep gray matter nuclei. Mild to moderate for age patchy bilateral cerebral white matter T2 and FLAIR hyperintensity, mostly periventricular, has not significantly progressed. No cerebral cortical encephalomalacia or chronic cerebral blood products identified.  Vascular: Major intracranial vascular flow voids are stable since 2017, the distal right vertebral artery demonstrates chronic slow flow or occlusion, see below. Dominant appearing distal left vertebral artery. Stable anterior circulation flow voids. Skull and upper cervical spine: Negative visible cervical spine. Normal bone marrow signal. Sinuses/Orbits: Combined fluid level and inspissated material within the right maxillary sinus is new since 2017. Other paranasal sinuses remain well pneumatized. Stable orbits soft tissues. Other: Visible internal auditory structures appear normal. Mastoid air cells remain clear. Scalp and face soft  tissues appear negative. MRA NECK FINDINGS Time-of-flight neck MRA reveals antegrade flow in both cervical carotid arteries to the skull base. Irregularity at the carotid bifurcations is greater on the right, although no proximal right ICA stenosis is estimated at less than 50 % with respect to the distal vessel. Dominant appearance of the left vertebral artery throughout the neck is similar to the 2005 study. Antegrade flow signal in the smaller cervical right vertebral artery is faint but present. MRA HEAD FINDINGS There is flow signal in the distal right vertebral artery at the origin of the right PICA. There is severe irregularity and stenosis of the right V4 segment distal to the PICA (series 406, image 14). Normal flow signal in the dominant distal left vertebral artery which appears to supply the basilar. Patent basilar artery without stenosis. Patent AICA and SCA origins. Right posterior communicating artery is present while the left is diminutive or absent. There is only mild irregularity of the bilateral PCA branches which demonstrate symmetric distal flow signal. Antegrade flow in both ICA siphons. There is moderate bilateral ICA siphon irregularity compatible with atherosclerosis. Mild-to-moderate bilateral supraclinoid ICA segment stenosis results. Ophthalmic artery  origins appear normal. Patent carotid termini. Normal MCA and ACA origins. Anterior communicating artery and visible ACA branches are within normal limits. Bilateral MCA M1 segments and MCA bifurcations appear normal. Visible bilateral MCA branches appear normal. IMPRESSION: 1.  No acute intracranial abnormality. 2. Stable noncontrast MRI appearance of the brain since 2017, notable for chronic small vessel disease primarily in the cerebellum and pons. 3. Severe chronic atherosclerosis and stenosis of the distal right vertebral artery, although the right PICA origin remains patent. 4. Dominant left vertebral artery without stenosis, and otherwise negative posterior circulation. 5. Right greater than left carotid bifurcation, and bilateral ICA siphon atherosclerosis. But no definite hemodynamically significant carotid stenosis. And otherwise negative anterior circulation. 6. Acute on chronic right maxillary sinus inflammation is new since 2017. Electronically Signed   By: Genevie Ann M.D.   On: 12/30/2017 18:59   Mr Brain Wo Contrast  Result Date: 12/30/2017 CLINICAL DATA:  84 year old male with persistent vertigo, with progressive symptoms recently. Difficulty walking. Occasional double vision. EXAM: MRI HEAD WITHOUT CONTRAST MRA HEAD WITHOUT CONTRAST MRA NECK WITHOUT CONTRAST TECHNIQUE: Multiplanar, multiecho pulse sequences of the brain and surrounding structures were obtained without intravenous contrast. Angiographic images of the Circle of Willis were obtained using MRA technique without intravenous contrast. Angiographic images of the neck were obtained using MRA technique without intravenous contrast. Carotid stenosis measurements (when applicable) are obtained utilizing NASCET criteria, using the distal internal carotid diameter as the denominator. COMPARISON:  Head CTs without contrast 11/15/2017 and earlier. Brain MRI 02/15/2016. Neck MRA 10/26/2004. FINDINGS: MRI HEAD FINDINGS Brain: Cerebral volume is not  significantly changed since 2017. No restricted diffusion to suggest acute infarction. No midline shift, mass effect, evidence of mass lesion, ventriculomegaly, extra-axial collection or acute intracranial hemorrhage. Cervicomedullary junction and pituitary are within normal limits. Small chronic infarcts in both cerebellar hemispheres are stable. Chronic lacunar infarcts suspected in the central pons with associated chronic T2 hyperintensity. Minimal to mild for age superimposed chronic T2 heterogeneity in the deep gray matter nuclei. Mild to moderate for age patchy bilateral cerebral white matter T2 and FLAIR hyperintensity, mostly periventricular, has not significantly progressed. No cerebral cortical encephalomalacia or chronic cerebral blood products identified. Vascular: Major intracranial vascular flow voids are stable since 2017, the distal right vertebral artery demonstrates chronic slow flow or occlusion, see below.  Dominant appearing distal left vertebral artery. Stable anterior circulation flow voids. Skull and upper cervical spine: Negative visible cervical spine. Normal bone marrow signal. Sinuses/Orbits: Combined fluid level and inspissated material within the right maxillary sinus is new since 2017. Other paranasal sinuses remain well pneumatized. Stable orbits soft tissues. Other: Visible internal auditory structures appear normal. Mastoid air cells remain clear. Scalp and face soft tissues appear negative. MRA NECK FINDINGS Time-of-flight neck MRA reveals antegrade flow in both cervical carotid arteries to the skull base. Irregularity at the carotid bifurcations is greater on the right, although no proximal right ICA stenosis is estimated at less than 50 % with respect to the distal vessel. Dominant appearance of the left vertebral artery throughout the neck is similar to the 2005 study. Antegrade flow signal in the smaller cervical right vertebral artery is faint but present. MRA HEAD FINDINGS  There is flow signal in the distal right vertebral artery at the origin of the right PICA. There is severe irregularity and stenosis of the right V4 segment distal to the PICA (series 406, image 14). Normal flow signal in the dominant distal left vertebral artery which appears to supply the basilar. Patent basilar artery without stenosis. Patent AICA and SCA origins. Right posterior communicating artery is present while the left is diminutive or absent. There is only mild irregularity of the bilateral PCA branches which demonstrate symmetric distal flow signal. Antegrade flow in both ICA siphons. There is moderate bilateral ICA siphon irregularity compatible with atherosclerosis. Mild-to-moderate bilateral supraclinoid ICA segment stenosis results. Ophthalmic artery origins appear normal. Patent carotid termini. Normal MCA and ACA origins. Anterior communicating artery and visible ACA branches are within normal limits. Bilateral MCA M1 segments and MCA bifurcations appear normal. Visible bilateral MCA branches appear normal. IMPRESSION: 1.  No acute intracranial abnormality. 2. Stable noncontrast MRI appearance of the brain since 2017, notable for chronic small vessel disease primarily in the cerebellum and pons. 3. Severe chronic atherosclerosis and stenosis of the distal right vertebral artery, although the right PICA origin remains patent. 4. Dominant left vertebral artery without stenosis, and otherwise negative posterior circulation. 5. Right greater than left carotid bifurcation, and bilateral ICA siphon atherosclerosis. But no definite hemodynamically significant carotid stenosis. And otherwise negative anterior circulation. 6. Acute on chronic right maxillary sinus inflammation is new since 2017. Electronically Signed   By: Genevie Ann M.D.   On: 12/30/2017 18:59   Dg Chest Port 1 View  Result Date: 01/07/2018 CLINICAL DATA:  84 y/o M; increased confusion and weakness over a few months. EXAM: PORTABLE CHEST  1 VIEW COMPARISON:  12/30/2017 chest radiograph FINDINGS: Stable normal cardiac silhouette given projection and technique. Aortic atherosclerosis with calcification. Improved aeration of right middle lobe. No new focal consolidation. Emphysema. No pleural effusion or pneumothorax. No acute osseous abnormality is evident. IMPRESSION: Improved aeration in right middle lobe. No new focal consolidation. Aortic atherosclerosis. Stable cardiac silhouette. Electronically Signed   By: Kristine Garbe M.D.   On: 01/07/2018 16:57     Subjective: No complaints.  Discharge Exam: Vitals:   01/14/18 2030 01/15/18 0600  BP: (!) 150/63 (!) 144/71  Pulse: 77 64  Resp: 18 18  Temp: 97.8 F (36.6 C) (!) 97.2 F (36.2 C)  SpO2: 100% 100%   Vitals:   01/14/18 0544 01/14/18 1300 01/14/18 2030 01/15/18 0600  BP: (!) 166/65 (!) 135/55 (!) 150/63 (!) 144/71  Pulse: 79 74 77 64  Resp: 17 16 18 18   Temp: (!) 97.4 F (  36.3 C) 97.6 F (36.4 C) 97.8 F (36.6 C) (!) 97.2 F (36.2 C)  TempSrc: Oral Oral Oral Oral  SpO2: 99% 99% 100% 100%  Weight:      Height:        General: Pt is alert, awake, not in acute distress Cardiovascular: RRR, S1/S2 +, no rubs, no gallops Respiratory: CTA bilaterally, no wheezing, no rhonchi Abdominal: Soft, NT, ND, bowel sounds + Extremities: no edema, no cyanosis    The results of significant diagnostics from this hospitalization (including imaging, microbiology, ancillary and laboratory) are listed below for reference.     Microbiology: Recent Results (from the past 240 hour(s))  Blood culture (routine x 2)     Status: None (Preliminary result)   Collection Time: 01/12/18  6:15 PM  Result Value Ref Range Status   Specimen Description   Final    BLOOD RIGHT FOREARM Performed at [facility], [address], [place], [place] [address]    Special Requests   Final    BOTTLES DRAWN AEROBIC AND ANAEROBIC Blood Culture adequate volume Performed at [facility] [address], [place], [place] [address]    Culture   Final    NO GROWTH 2 DAYS Performed at [facility] [address], [place], [place] [address]    Report Status PENDING  Incomplete  Blood culture (routine x 2)     Status: None (Preliminary result)   Collection Time: 01/12/18  6:15 PM  Result Value Ref Range Status   Specimen Description   Final    BLOOD LEFT ANTECUBITAL Performed at [facility] [address], [place], [place] [address]    Special Requests   Final    BOTTLES DRAWN AEROBIC AND ANAEROBIC Blood Culture adequate volume Performed at [facility] [address], [place], [place] [address]    Culture   Final    NO GROWTH 2 DAYS Performed at [facility] [address] [place], [place] [address]    Report Status PENDING  Incomplete  MRSA PCR Screening     Status: None   Collection Time: 01/13/18  6:55 AM  Result Value Ref Range Status   MRSA by PCR NEGATIVE NEGATIVE Final    Comment:        The GeneXpert MRSA Assay (FDA approved for NASAL specimens only), is one component of a comprehensive MRSA colonization surveillance program. It is not intended to diagnose MRSA infection nor to guide or monitor treatment for MRSA infections. Performed at [facility], [place] [address], [place], [place] [address]      Labs: BNP (last 3 results) No results for input(s): BNP in the last 8760 hours. Basic Metabolic Panel: Recent Labs  Lab 01/09/18 0724 01/12/18 1248 01/13/18 0453 01/14/18 0606 01/14/18 1146 01/15/18 0526  NA 140 139 143  --  135 141  K 4.1 5.1 4.1  --  4.3 4.4  CL 110 109 115*  --  107 113*  CO2 19* 17* 20*  --  23 22  GLUCOSE 134* 264* 136*  --  253* 150*  BUN 46* 61* 44*  --  35* 36*  CREATININE 2.20* 2.74* 2.03* 1.78* 2.09* 1.95*  CALCIUM 8.6* 8.8* 8.7*  --  8.4* 8.8*  MG  --   --  2.1  --   --   --   PHOS  --   --  3.5  --   --   --  Liver Function Tests: Recent Labs  Lab 01/12/18 1248 01/13/18 0453  AST 52* 36  ALT 27 23  ALKPHOS 103 96  BILITOT 1.1 1.1  PROT 6.4* 6.1*  ALBUMIN 3.4* 3.2*   No results for input(s): LIPASE, AMYLASE in the last 168 hours. Recent Labs  Lab 01/12/18 1942  AMMONIA 11   CBC: Recent Labs  Lab 01/12/18 1248 01/13/18 0453  WBC 6.9 4.2  NEUTROABS 5.3  --   HGB 10.9* 10.1*  HCT 32.8* 30.2*  MCV 96.5 97.1  PLT 116* 90*   Cardiac Enzymes: No results for input(s): CKTOTAL, CKMB, CKMBINDEX, TROPONINI in the last 168 hours. BNP: Invalid input(s): POCBNP CBG: Recent Labs  Lab 01/14/18 0724 01/14/18 1132 01/14/18 1635 01/14/18 2028 01/15/18 0742  GLUCAP 152* 234* 176* 148* 119*   D-Dimer No results for input(s): DDIMER in the last 72 hours. Hgb A1c Recent Labs    01/13/18 0453  HGBA1C 6.5*   Lipid Profile No results for input(s): CHOL, HDL, LDLCALC, TRIG, CHOLHDL, LDLDIRECT in the last 72 hours. Thyroid function studies Recent Labs    01/13/18 0453  TSH 3.003   Anemia work up No results for input(s): VITAMINB12, FOLATE, FERRITIN, TIBC, IRON, RETICCTPCT in the last 72 hours. Urinalysis    Component Value Date/Time   COLORURINE YELLOW 01/12/2018 August 01/12/2018 1248   LABSPEC 1.012 01/12/2018 1248   PHURINE 5.0 01/12/2018 1248   GLUCOSEU 150 (A) 01/12/2018 1248   GLUCOSEU NEGATIVE 09/07/2014 1334   HGBUR MODERATE (A) 01/12/2018 1248   BILIRUBINUR NEGATIVE 01/12/2018 1248   KETONESUR NEGATIVE 01/12/2018 1248   PROTEINUR 100 (A) 01/12/2018 1248   UROBILINOGEN 0.2 09/07/2014 1334   NITRITE NEGATIVE 01/12/2018 1248   LEUKOCYTESUR NEGATIVE 01/12/2018 1248   Sepsis Labs Invalid input(s): PROCALCITONIN,  WBC,  LACTICIDVEN Microbiology Recent Results (from the past 240 hour(s))  Blood culture (routine x 2)     Status: None (Preliminary result)   Collection Time: 01/12/18  6:15 PM  Result Value Ref Range Status   Specimen  Description   Final    BLOOD RIGHT FOREARM Performed at [facility], [place] [address], [place], [place] [address]    Special Requests   Final    BOTTLES DRAWN AEROBIC AND ANAEROBIC Blood Culture adequate volume Performed at [facility] [address], [place], [place] [address]    Culture   Final    NO GROWTH 2 DAYS Performed at [facility] [address] [place], [place] [address]    Report Status PENDING  Incomplete  Blood culture (routine x 2)     Status: None (Preliminary result)   Collection Time: 01/12/18  6:15 PM  Result Value Ref Range Status   Specimen Description   Final    BLOOD LEFT ANTECUBITAL Performed at [facility] [address], [place], [place] [address]    Special Requests   Final    BOTTLES DRAWN AEROBIC AND ANAEROBIC Blood Culture adequate volume Performed at [facility] [address] [place], [place] [address]    Culture   Final    NO GROWTH 2 DAYS Performed at [facility] [address] [place], [place] [address]    Report Status PENDING  Incomplete  MRSA PCR Screening     Status: None   Collection Time: 01/13/18  6:55 AM  Result Value Ref Range Status   MRSA by PCR NEGATIVE NEGATIVE Final  Comment:        The GeneXpert MRSA Assay (FDA approved for NASAL specimens only), is one component of a comprehensive MRSA colonization surveillance program. It is not intended to diagnose MRSA infection nor to guide or monitor treatment for MRSA infections. Performed at [facility], [place] [address], [place], [place] [address]      Time coordinating discharge: Over 30 minutes  SIGNED:   Charlynne Cousins, MD  [facility] 01/15/2018, 9:06 AM Pager   If 7PM-7AM, please contact night-coverage [email or web address] Password [email or web address]

## 2018-01-15 NOTE — Clinical Social Work Note (Signed)
Clinical Social Work Assessment  Patient Details  Name: Chad Mcmillan MRN: [id number] Date of Birth: 1924/01/23  Date of referral:  01/15/18               Reason for consult:  Facility Placement                Permission sought to share information with:  Facility Sport and exercise psychologist, Tourist information centre manager, Family Supports Permission granted to share information::  Yes, Verbal Permission Granted  Name::     Surveyor, quantity::  SNF  Relationship::  Son  Contact Information:     Housing/Transportation Living arrangements for the past 2 months:  Single Family Home Source of Information:  Patient, Adult Children Patient Interpreter Needed:  None Criminal Activity/Legal Involvement Pertinent to Current Situation/Hospitalization:    Significant Relationships:  Adult Children, Spouse Lives with:  Spouse Do you feel safe going back to the place where you live?  No Need for family participation in patient care:  Yes (Comment)  Care giving concerns:  Patient lives with spouse, who also has dementia. She is able to assist patient however she has poor short term memory.    Social Worker assessment / plan:  LCSW consulted for facility placement.  Patient admitted for confusion.  LCSW met with patient at bedside. Son, Chad Mcmillan is present.   Family met with palliative 2/27. Patient and family would like to attempt rehab.   Patient lives with his spouse. Patients spouse also has dementia. Chad Mcmillan reports that patients spouse gets around better than patient, but has short term memory issues. Patient is independent in [place]. Patient ambulates with a walker at baseline.   PLAN: Patient will go to SNF at dc.     Employment status:  Retired Forensic scientist:  Medicare PT Recommendations:  [place] / Referral to community resources:  [place]  Patient/Family's Response to care:  Patient is agreeable to SNF and willing to participate.   Patient/Family's  Understanding of and Emotional Response to Diagnosis, Current Treatment, and Prognosis:  Patient and family understand diagnosis and agreeable to treatment plan.   Emotional Assessment Appearance:  Appears stated age Attitude/Demeanor/Rapport:    Affect (typically observed):  Calm, Accepting Orientation:  Oriented to Self, Oriented to Place, Oriented to  Time, Oriented to Situation Alcohol / Substance use:  Not Applicable Psych involvement (Current and /or in the community):  No (Comment)  Discharge Needs  Concerns to be addressed:  No discharge needs identified Readmission within the last 30 days:  Yes Current discharge risk:  None Barriers to Discharge:  No SNF bed   Servando Snare, LCSW 01/15/2018, 12:05 PM

## 2018-01-15 NOTE — NC FL2 (Signed)
[place] LEVEL OF CARE SCREENING TOOL     IDENTIFICATION  Patient Name: Chad Mcmillan Birthdate: December 31, 1923 Sex: male Admission Date (Current Location): 01/12/2018  [facility] and [place] Number:  Herbalist and Address:  [facility],  [place] [place], [place]      Provider Number: [id number]  Attending Physician Name and Address:  Charlynne Cousins, MD  Relative Name and Phone Number:       Current Level of Care: Hospital Recommended Level of Care: [place] Prior Approval Number:    Date Approved/Denied: 01/15/18 PASRR Number: [id number]  Discharge Plan: SNF    Current Diagnoses: Patient Active Problem List   Diagnosis Date Noted  . Agitation   . Dementia with behavioral disturbance   . Palliative care by specialist   . CKD (chronic kidney disease), stage III ([place]) 01/14/2018  . Toxic encephalopathy 01/12/2018  . HAP (hospital-acquired pneumonia) 01/12/2018  . AKI (acute kidney injury) ([place]) 01/12/2018  . Inguinal hernia of right side without obstruction or gangrene 01/12/2018  . Acute renal failure superimposed on stage 3 chronic kidney disease ([place]) 01/07/2018  . Vertigo 01/07/2018  . LBBB (left bundle branch block) 01/07/2018  . Generalized weakness 01/07/2018  . Acute metabolic encephalopathy 85/46/2703  . Dehydration   . Foot pain, bilateral 10/07/2017  . Memory loss 04/07/2017  . Numbness 03/06/2016  . Dizzy 01/31/2016  . Medication side effect 03/08/2015  . Pancytopenia ([place]) 07/27/2012  . Goals of care, counseling/discussion 07/27/2012  . DM (diabetes mellitus) ([place]) 07/27/2012  . Anemia 07/27/2012  . Encounter for long-term (current) use of other medications 07/23/2011  . NASH (nonalcoholic steatohepatitis) 07/17/2010  . COUGH 02/06/2010  . PNEUMONIA, ORGANISM UNSPECIFIED 11/27/2009  . NEOPLASM, MALIGNANT, SKIN, FACE 06/01/2008  . ADENOCARCINOMA, PROSTATE 06/01/2008  . HYPERKALEMIA  06/01/2008  . Stroke (cerebrum) ([place]) 06/01/2008  . Disorder of kidney and ureter 06/01/2008  . NEOPLASM, MALIGNANT, SKIN, FACE 06/01/2008  . Dyslipidemia 06/13/2007  . Essential hypertension 06/13/2007  . Coronary atherosclerosis 06/13/2007    Orientation RESPIRATION BLADDER Height & Weight     Self, Time, Situation, Place  Normal Continent, External catheter Weight: 179 lb (81.2 kg) Height:  6\' 1"  (185.4 cm)  BEHAVIORAL SYMPTOMS/MOOD NEUROLOGICAL BOWEL NUTRITION STATUS      Continent Diet(see dc summary)  AMBULATORY STATUS COMMUNICATION OF NEEDS Skin   Extensive Assist Verbally PU Stage and Appropriate Care(Sacrum, right posterior)                       Personal Care Assistance Level of Assistance  Bathing, Feeding, Dressing Bathing Assistance: Limited assistance Feeding assistance: Independent Dressing Assistance: Limited assistance     Functional Limitations Info  Sight, Hearing, Speech Sight Info: Impaired(wears glasses) Hearing Info: Adequate Speech Info: Impaired(Slurred)    SPECIAL CARE FACTORS FREQUENCY  PT (By licensed PT), OT (By licensed OT)     PT Frequency: 5x/week OT Frequency: 5x/week            Contractures Contractures Info: Not present    Additional Factors Info  Code Status, Allergies Code Status Info: Partial Allergies Info: NKA           Current Medications (01/15/2018):  This is the current hospital active medication list Current Facility-Administered Medications  Medication Dose Route Frequency Provider Last Rate Last Dose  . acetaminophen (TYLENOL) tablet 650 mg  650 mg Oral Q6H PRN Toy Baker, MD  Or  . acetaminophen (TYLENOL) suppository 650 mg  650 mg Rectal Q6H PRN Doutova, Anastassia, MD      . albuterol (PROVENTIL) (2.5 MG/3ML) 0.083% nebulizer solution 2.5 mg  2.5 mg Nebulization Q2H PRN Doutova, Anastassia, MD      . amLODipine (NORVASC) tablet 5 mg  5 mg Oral Daily Doutova, Anastassia, MD   5 mg at  01/15/18 0908  . aspirin EC tablet 81 mg  81 mg Oral Daily Doutova, Anastassia, MD   81 mg at 01/15/18 0909  . donepezil (ARICEPT) tablet 5 mg  5 mg Oral QHS Charlynne Cousins, MD   5 mg at 01/14/18 2113  . guaiFENesin (MUCINEX) 12 hr tablet 600 mg  600 mg Oral BID Toy Baker, MD   600 mg at 01/15/18 0908  . haloperidol lactate (HALDOL) injection 2 mg  2 mg Intravenous Q6H PRN Charlynne Cousins, MD      . HYDROcodone-acetaminophen (NORCO/VICODIN) 5-325 MG per tablet 1-2 tablet  1-2 tablet Oral Q4H PRN Toy Baker, MD      . insulin aspart (novoLOG) injection 0-5 Units  0-5 Units Subcutaneous QHS Doutova, Anastassia, MD      . insulin aspart (novoLOG) injection 0-9 Units  0-9 Units Subcutaneous TID WC Toy Baker, MD   3 Units at 01/15/18 1151  . insulin detemir (LEVEMIR) injection 5 Units  5 Units Subcutaneous BID Charlynne Cousins, MD   5 Units at 01/15/18 [phone]  . levofloxacin (LEVAQUIN) tablet 250 mg  250 mg Oral QHS Berton Mount, RPH      . LORazepam (ATIVAN) injection 0.5 mg  0.5 mg Intravenous Q6H PRN Toy Baker, MD   0.5 mg at 01/14/18 1525  . ondansetron (ZOFRAN) tablet 4 mg  4 mg Oral Q6H PRN Toy Baker, MD       Or  . ondansetron (ZOFRAN) injection 4 mg  4 mg Intravenous Q6H PRN Doutova, Anastassia, MD      . polyvinyl alcohol (LIQUIFILM TEARS) 1.4 % ophthalmic solution 1 drop  1 drop Both Eyes PRN Charlynne Cousins, MD   1 drop at 01/14/18 1525  . pravastatin (PRAVACHOL) tablet 80 mg  80 mg Oral Daily Toy Baker, MD   80 mg at 01/15/18 0909  . QUEtiapine (SEROQUEL) tablet 25 mg  25 mg Oral QHS Charlynne Cousins, MD   25 mg at 01/14/18 2112     Discharge Medications: Please see discharge summary for a list of discharge medications.  Relevant Imaging Results:  Relevant Lab Results:   Additional Information ssn: [id number]  Servando Snare, LCSW

## 2018-01-16 LAB — BASIC METABOLIC PANEL
Anion gap: 8 (ref 5–15)
BUN: 41 mg/dL — ABNORMAL HIGH (ref 6–20)
CALCIUM: 8.8 mg/dL — AB (ref 8.9–10.3)
CO2: 18 mmol/L — ABNORMAL LOW (ref 22–32)
CREATININE: 1.89 mg/dL — AB (ref 0.61–1.24)
Chloride: 112 mmol/L — ABNORMAL HIGH (ref 101–111)
GFR, EST AFRICAN AMERICAN: 34 mL/min — AB (ref >60)
GFR, EST NON AFRICAN AMERICAN: 29 mL/min — AB (ref >60)
Glucose, Bld: 159 mg/dL — ABNORMAL HIGH (ref 65–99)
Potassium: 4.8 mmol/L (ref 3.5–5.1)
SODIUM: 138 mmol/L (ref 135–145)

## 2018-01-16 LAB — GLUCOSE, CAPILLARY
GLUCOSE-CAPILLARY: 201 mg/dL — AB (ref 65–99)
Glucose-Capillary: 192 mg/dL — ABNORMAL HIGH (ref 65–99)

## 2018-01-16 NOTE — Clinical Social Work Placement (Signed)
    10:56 AM  Patient and family chose bed at [facility]  LCSW confirmed bed at facility.  Patient will transport by [facility].  Patient can transport after 1pm  LCSW notified family of transport.   LCSW faxed dc documents to facility.   RN report #: [id number]  BKJ  CLINICAL SOCIAL WORK PLACEMENT  NOTE  Date:  01/16/2018  Patient Details  Name: Chad Mcmillan MRN: [id number] Date of Birth: 04-24-1924  Clinical Social Work is seeking post-discharge placement for this patient at the [place] level of care (*CSW will initial, date and re-position this form in  chart as items are completed):      Patient/family provided with [place] Work Department's list of facilities offering this level of care within the geographic area requested by the patient (or if unable, by the patient's family).  Yes   Patient/family informed of their freedom to choose among providers that offer the needed level of care, that participate in Medicare, Medicaid or managed care program needed by the patient, have an available bed and are willing to accept the patient.  Yes   Patient/family informed of [facility]'s ownership interest in [facility] and [facility], as well as of the fact that they are under no obligation to receive care at these facilities.  PASRR submitted to EDS on       PASRR number received on 01/15/18     Existing PASRR number confirmed on       FL2 transmitted to all facilities in geographic area requested by pt/family on 01/15/18     FL2 transmitted to all facilities within larger geographic area on       Patient informed that his/her managed care company has contracts with or will negotiate with certain facilities, including the following:        Yes   Patient/family informed of bed offers received.  [facility]     Physician recommends and patient chooses bed at [facility]      Patient to be transferred to [facility] on 01/16/18.  Patient to be transferred to facility by EMS     Patient family notified on 01/16/18 of transfer.  Name of family member notified:  Chriss Czar, Son     PHYSICIAN Please sign FL2     Additional Comment:    _______________________________________________ Servando Snare, LCSW 01/16/2018, 10:56 AM

## 2018-01-16 NOTE — Progress Notes (Signed)
[facility] PROGRESS NOTE    Progress Note  Chad Mcmillan  HYQ:[id number] DOB: 1924-09-21 DOA: 01/12/2018 PCP: [name], MD     Brief Narrative:   Chad Mcmillan is an 84 y.o. male past medical history of dementia, prostate cancer diabetes mellitus type 2 left bundle branch block presents with contusion to the emergency room, as per family members got progressive confusion balance difficulties with repeated falls and significant agitation.  Recently discharged from the hospital and acute encephalopathy  Assessment/Plan:   Acute encephalopathy: Resolved  HCAP: On oral Levaquin. Awaiting placement patient is medically stable for discharge.  Acute renal failure on chronic renal disease stage III: Baseline creatinine is 1.7-2.0. Resolved, pre-renal.  Advanced dementia with behavioral disturbances: We will start him on oral Aricept. Palliative care to follow-up at facility.  DM (diabetes mellitus) ([place]) We will add long-acting insulin plus sliding scale.   DVT prophylaxis: lovenox Family Communication:none Disposition Plan/Barrier to D/C: SNF today Code Status:     Code Status Orders  (From admission, onward)        Start     Ordered   01/13/18 0004  Limited resuscitation (code)  Continuous    Question Answer Comment  In the event of cardiac or respiratory ARREST: Initiate Code Blue, Call Rapid Response Yes   In the event of cardiac or respiratory ARREST: Perform CPR Yes   In the event of cardiac or respiratory ARREST: Perform Intubation/Mechanical Ventilation No   In the event of cardiac or respiratory ARREST: Use NIPPV/BiPAp only if indicated Yes   In the event of cardiac or respiratory ARREST: Administer ACLS medications if indicated Yes   In the event of cardiac or respiratory ARREST: Perform Defibrillation or Cardioversion if indicated Yes      01/13/18 0003    Code Status History    Date Active Date Inactive Code Status Order ID Comments User Context     01/07/2018 22:42 01/09/2018 17:08 Partial Code [id number]  Ivor Costa, MD ED    Advance Directive Documentation     Most Recent Value  Type of Advance Directive  Healthcare Power of Attorney  Pre-existing out of facility DNR order (yellow form or pink MOST form)  No data  "MOST" Form in Place?  No data        IV Access:    Peripheral IV   Procedures and diagnostic studies:   No results found.   Medical Consultants:    None.  Anti-Infectives:   IV vancomycin and cefepime  Subjective:    Chad Mcmillan no complains  Objective:    Vitals:   01/14/18 2030 01/15/18 0600 01/15/18 1419 01/16/18 0500  BP: (!) 150/63 (!) 144/71 (!) 160/68 (!) 175/105  Pulse: 77 64 73 80  Resp: 18 18 18 18   Temp: 97.8 F (36.6 C) (!) 97.2 F (36.2 C) 97.6 F (36.4 C) 98 F (36.7 C)  TempSrc: Oral Oral Oral Oral  SpO2: 100% 100% 98% 97%  Weight:      Height:        Intake/Output Summary (Last 24 hours) at 01/16/2018 0957 Last data filed at 01/16/2018 0826 Gross per 24 hour  Intake 600 ml  Output 1825 ml  Net -1225 ml   Filed Weights   01/12/18 1153  Weight: 81.2 kg (179 lb)    Exam: General exam: In no acute distress. Respiratory system: Good air movement and clear to auscultation. Cardiovascular system: S1 & S2 heard, RRR.  Gastrointestinal system: Abdomen is  nondistended, soft and nontender.  Extremities: No pedal edema. Skin: No rashes, lesions or ulcers    Data Reviewed:    Labs: Basic Metabolic Panel: Recent Labs  Lab 01/12/18 1248 01/13/18 0453 01/14/18 0606 01/14/18 1146 01/15/18 0526 01/16/18 0542  NA 139 143  --  135 141 138  K 5.1 4.1  --  4.3 4.4 4.8  CL 109 115*  --  107 113* 112*  CO2 17* 20*  --  23 22 18*  GLUCOSE 264* 136*  --  253* 150* 159*  BUN 61* 44*  --  35* 36* 41*  CREATININE 2.74* 2.03* 1.78* 2.09* 1.95* 1.89*  CALCIUM 8.8* 8.7*  --  8.4* 8.8* 8.8*  MG  --  2.1  --   --   --   --   PHOS  --  3.5  --   --   --   --     GFR Estimated Creatinine Clearance: 28.8 mL/min (A) (by C-G formula based on SCr of 1.89 mg/dL (H)). Liver Function Tests: Recent Labs  Lab 01/12/18 1248 01/13/18 0453  AST 52* 36  ALT 27 23  ALKPHOS 103 96  BILITOT 1.1 1.1  PROT 6.4* 6.1*  ALBUMIN 3.4* 3.2*   No results for input(s): LIPASE, AMYLASE in the last 168 hours. Recent Labs  Lab 01/12/18 1942  AMMONIA 11   Coagulation profile No results for input(s): INR, PROTIME in the last 168 hours.  CBC: Recent Labs  Lab 01/12/18 1248 01/13/18 0453  WBC 6.9 4.2  NEUTROABS 5.3  --   HGB 10.9* 10.1*  HCT 32.8* 30.2*  MCV 96.5 97.1  PLT 116* 90*   Cardiac Enzymes: No results for input(s): CKTOTAL, CKMB, CKMBINDEX, TROPONINI in the last 168 hours. BNP (last 3 results) No results for input(s): PROBNP in the last 8760 hours. CBG: Recent Labs  Lab 01/15/18 0742 01/15/18 1103 01/15/18 1651 01/15/18 2125 01/16/18 0732  GLUCAP 119* 203* 203* 182* 192*   D-Dimer: No results for input(s): DDIMER in the last 72 hours. Hgb A1c: No results for input(s): HGBA1C in the last 72 hours. Lipid Profile: No results for input(s): CHOL, HDL, LDLCALC, TRIG, CHOLHDL, LDLDIRECT in the last 72 hours. Thyroid function studies: No results for input(s): TSH, T4TOTAL, T3FREE, THYROIDAB in the last 72 hours.  Invalid input(s): FREET3 Anemia work up: No results for input(s): VITAMINB12, FOLATE, FERRITIN, TIBC, IRON, RETICCTPCT in the last 72 hours. Sepsis Labs: Recent Labs  Lab 01/12/18 1248 01/13/18 0453  WBC 6.9 4.2   Microbiology Recent Results (from the past 240 hour(s))  Blood culture (routine x 2)     Status: None (Preliminary result)   Collection Time: 01/12/18  6:15 PM  Result Value Ref Range Status   Specimen Description   Final    BLOOD RIGHT FOREARM Performed at [facility], [place] [address] [place], [place] [address]    Special Requests   Final    BOTTLES DRAWN AEROBIC AND ANAEROBIC Blood Culture  adequate volume Performed at [place] [address], [place], [place] [address]    Culture   Final    NO GROWTH 3 DAYS Performed at [facility], [place] [address], [place], [place] [address]    Report Status PENDING  Incomplete  Blood culture (routine x 2)     Status: None (Preliminary result)   Collection Time: 01/12/18  6:15 PM  Result Value Ref Range Status   Specimen Description   Final  BLOOD LEFT ANTECUBITAL Performed at [place] [address], [place], [place] [address]    Special Requests   Final    BOTTLES DRAWN AEROBIC AND ANAEROBIC Blood Culture adequate volume Performed at [place] [address] [place], [place] [address]    Culture   Final    NO GROWTH 3 DAYS Performed at [facility], [place] [address] [place], [place] [address]    Report Status PENDING  Incomplete  MRSA PCR Screening     Status: None   Collection Time: 01/13/18  6:55 AM  Result Value Ref Range Status   MRSA by PCR NEGATIVE NEGATIVE Final    Comment:        The GeneXpert MRSA Assay (FDA approved for NASAL specimens only), is one component of a comprehensive MRSA colonization surveillance program. It is not intended to diagnose MRSA infection nor to guide or monitor treatment for MRSA infections. Performed at [facility], [place] [address], [place], [place] [address]      Medications:   . amLODipine  5 mg Oral Daily  . aspirin EC  81 mg Oral Daily  . donepezil  5 mg Oral QHS  . guaiFENesin  600 mg Oral BID  . insulin aspart  0-5 Units Subcutaneous QHS  . insulin aspart  0-9 Units Subcutaneous TID WC  . insulin detemir  5 Units Subcutaneous BID  . levofloxacin  250 mg Oral QHS  . pravastatin  80 mg Oral Daily  . QUEtiapine  25 mg Oral QHS   Continuous Infusions:     LOS: 4 days   Charlynne Cousins  [facility] Pager [phone]  *Please refer to [email or web address], password  [email or web address] to get updated schedule on who will round on this patient, as hospitalists switch teams weekly. If 7PM-7AM, please contact night-coverage at [email or web address], password [email or web address] for any overnight needs.  01/16/2018, 9:57 AM

## 2018-01-16 NOTE — Progress Notes (Signed)
Report called to [place] at [employer].

## 2018-01-17 LAB — CULTURE, BLOOD (ROUTINE X 2)
CULTURE: NO GROWTH
CULTURE: NO GROWTH
SPECIAL REQUESTS: ADEQUATE
SPECIAL REQUESTS: ADEQUATE

## 2018-01-19 ENCOUNTER — Ambulatory Visit (INDEPENDENT_AMBULATORY_CARE_PROVIDER_SITE_OTHER): Payer: Medicare Other | Admitting: Ophthalmology

## 2018-01-21 NOTE — Discharge Summary (Signed)
Physician Discharge Summary  Chad Mcmillan IRC:[id number] DOB: [birth year] DOA: 01/07/2018  PCP: [name], MD  Admit date: 01/07/2018 Discharge date: 01/09/2018  Time spent: 35 minutes  Recommendations for Outpatient Follow-up:  1. PCP in one week 2. Home health PT OT   Discharge Diagnoses:    Dehydration   Acute kidney injury Vertigo     Dyslipidemia   Coronary atherosclerosis   Stroke (cerebrum) (HCC)   Acute renal failure superimposed on stage 3 chronic kidney disease (HCC)   Vertigo   LBBB (left bundle branch block)   Generalized weakness   Discharge Condition: improved  Diet recommendaHeart healthy Filed Weights   01/08/18 0121  Weight: 81.2 kg (179 lb 0.2 oz)    History of present illness:  84/M with :hyperlipidemia, diabetes mellitus, stroke, CKD-3, CAD, prostate cancer, vertigo, [place], left bundle blockage, who presented with confusion, agitation, generalized weakness    Hospital Course:   Weakness/Debility/falls/confusion -Clinically suggestive of ongoing failure to thrive and gait instability, could also have some senile dementia, also has history of intermittent long-standing vertigo - Has worsening acute renal failure and was on diuretics recently so could have a component of dehydration which is contributing -TSH, B-12 unremarkable, no signs or symptoms of infection -He also had an MRI/MRA of his brain on 2/12 in the emergency room which was negative for any acute findings -hydrated with IVF, mentation stable without any confusion this admission -PT OT evaluation completed HHPT recommended and set up at [facility]  Acute kidney injury on chronic kidney disease stage III - Baseline creatinine around 1.8-2 range, creatinine 2.6 on admission -improved with hydration  History of CAD -No chest pain, continue aspirin and statin  History of vertigo -PT eval, continue when necessary meclizine, MRI negative for cerebellar disease  History of CVA -Continue  aspirin, statin  Essential hypertension -Started on amlodipine this admit      Discharge Exam: Vitals:   01/09/18 0451 01/09/18 0903  BP: (!) 174/64 (!) 155/52  Pulse: 80 86  Resp: 20 18  Temp: 98.2 F (36.8 C) 97.8 F (36.6 C)  SpO2: 98% 100%    General: AAOx2 Cardiovascular: S1S2/RRR Respiratory: CTAB  Discharge Instructions   Discharge Instructions    Diet - low sodium heart healthy   Complete by:  As directed    Increase activity slowly   Complete by:  As directed      Allergies as of 01/09/2018   No Known Allergies     Medication List    TAKE these medications   Acetaminophen 500 MG coapsule Take 500-1,000 mg by mouth every 6 (six) hours as needed for headache (pain).   amLODipine 5 MG tablet Commonly known as:  NORVASC Take 1 tablet (5 mg total) by mouth daily. What changed:    medication strength  how much to take   aspirin EC 81 MG tablet Take 1 tablet (81 mg total) by mouth daily.   insulin lispro 100 UNIT/ML injection Commonly known as:  HUMALOG 3 times a day (just before each meal), 15-10-10 units What changed:    how much to take  how to take this  when to take this  additional instructions   insulin NPH Human 100 UNIT/ML injection Commonly known as:  HUMULIN N Inject 0.05 mLs (5 Units total) into the skin at bedtime.   nitroGLYCERIN 0.4 MG SL tablet Commonly known as:  NITROSTAT Place 1 tablet (0.4 mg total) under the tongue every 5 (five) minutes as needed. For chest  pain   pravastatin 80 MG tablet Commonly known as:  PRAVACHOL TAKE 1 TABLET BY MOUTH ONCE DAILY What changed:    how much to take  how to take this  when to take this      No Known Allergies    The results of significant diagnostics from this hospitalization (including imaging, microbiology, ancillary and laboratory) are listed below for reference.    Significant Diagnostic Studies: Ct Abdomen Pelvis Wo Contrast  Result Date:  01/12/2018 CLINICAL DATA:  Nausea, vomiting. EXAM: CT ABDOMEN AND PELVIS WITHOUT CONTRAST TECHNIQUE: Multidetector CT imaging of the abdomen and pelvis was performed following the standard protocol without IV contrast. COMPARISON:  None. FINDINGS: Lower chest: Left posterior basilar airspace opacity is noted most consistent with pneumonia. Right middle lobe opacity is also noted concerning for pneumonia. Hepatobiliary: Status post cholecystectomy. Nodular hepatic margins are noted concerning for hepatic cirrhosis. No focal lesion is seen in these unenhanced images. Pancreas: Unremarkable. No pancreatic ductal dilatation or surrounding inflammatory changes. Spleen: Normal in size without focal abnormality. Adrenals/Urinary Tract: Adrenal glands appear normal. Bilateral renal cysts are noted. No hydronephrosis or renal obstruction is noted. No renal or ureteral calculi are noted. Urinary bladder is unremarkable. Stomach/Bowel: The stomach appears normal. The appendix appears normal. Sigmoid diverticulosis is noted without inflammation. There is no evidence of bowel obstruction. Vascular/Lymphatic: Aortic atherosclerosis. No enlarged abdominal or pelvic lymph nodes. Reproductive: Surgical clips are seen in the region of the prostate gland. Other: Large right inguinal hernia is noted which contains loops of small bowel, but does not result in incarceration or obstruction. Musculoskeletal: Old L2 compression fracture is noted. No other osseous abnormality is noted. IMPRESSION: Large right inguinal hernia is noted which contains loops of small bowel, but does not result in incarceration or obstruction. Left posterior basilar opacity is noted as well as right middle lobe opacity, concerning for possible pneumonia. Nodular hepatic contours are noted concerning for hepatic cirrhosis. Sigmoid diverticulosis is noted without inflammation. Old L2 compression fracture. Aortic Atherosclerosis (ICD10-I70.0). Electronically Signed    By: Marijo Conception, M.D.   On: 01/12/2018 21:46   Dg Chest 2 View  Result Date: 01/12/2018 CLINICAL DATA:  Confusion. EXAM: CHEST  2 VIEW COMPARISON:  Radiographs of January 07, 2018. FINDINGS: The heart size and mediastinal contours are within normal limits. Atherosclerosis of thoracic aorta is noted. Left lung is clear. Mildly increased right middle lobe opacity is noted concerning for worsening atelectasis or infiltrate. The visualized skeletal structures are unremarkable. IMPRESSION: Mildly increased right middle lobe opacity is noted concerning for worsening atelectasis or infiltrate. Aortic Atherosclerosis (ICD10-I70.0). Electronically Signed   By: Marijo Conception, M.D.   On: 01/12/2018 16:19   Dg Chest 2 View  Result Date: 12/30/2017 CLINICAL DATA:  Vertigo EXAM: CHEST  2 VIEW COMPARISON:  04/23/2016 FINDINGS: Heart is top-normal in size. There is moderate aortic atherosclerosis along the transverse portion without aneurysm. Patchy airspace opacities are redemonstrated in the right middle lobe distribution compatible with pneumonia. Blunting the costophrenic angles compatible small pleural effusions. No overt pulmonary edema. Emphysematous hyperinflation of the lungs upper lobe predominant. No acute osseous abnormality. Degenerative changes are seen along the dorsal spine with osteopenia. IMPRESSION: 1. Redemonstration of right middle lobe airspace opacities suspicious for pneumonia and atelectasis. 2. Moderate aortic atherosclerosis. 3. Emphysema. Electronically Signed   By: Ashley Royalty M.D.   On: 12/30/2017 21:17   Ct Head Wo Contrast  Result Date: 01/12/2018 CLINICAL DATA:  Altered mental status  EXAM: CT HEAD WITHOUT CONTRAST TECHNIQUE: Contiguous axial images were obtained from the base of the skull through the vertex without intravenous contrast. COMPARISON:  Head CT 01/07/2018 FINDINGS: Brain: No mass lesion, intraparenchymal hemorrhage or extra-axial collection. No evidence of acute  cortical infarct. There is periventricular hypoattenuation compatible with chronic microvascular disease. Diffuse atrophy, unchanged. Vascular: Atherosclerotic calcification of the vertebral and internal carotid arteries at the skull base. Skull: Normal visualized skull base, calvarium and extracranial soft tissues. Sinuses/Orbits: No sinus fluid levels or advanced mucosal thickening. No mastoid effusion. Normal orbits. IMPRESSION: Unchanged generalized atrophy and sequelae of chronic ischemic microangiopathy without acute intracranial abnormality. Electronically Signed   By: Ulyses Jarred M.D.   On: 01/12/2018 16:37   Ct Head Wo Contrast  Result Date: 01/07/2018 CLINICAL DATA:  Altered level of consciousness. EXAM: CT HEAD WITHOUT CONTRAST TECHNIQUE: Contiguous axial images were obtained from the base of the skull through the vertex without intravenous contrast. COMPARISON:  Brain MRI 12/30/2017.  Head CT 11/15/2017 FINDINGS: Brain: Generalized atrophy unchanged from prior exam. Cerebral and cerebellar chronic small vessel ischemia, also unchanged. No intracranial hemorrhage, mass effect, or midline shift. No hydrocephalus. The basilar cisterns are patent. No evidence of territorial infarct or acute ischemia. No extra-axial or intracranial fluid collection. Vascular: Atherosclerosis of skullbase vasculature without hyperdense vessel or abnormal calcification. Skull: No fracture or focal lesion. Sinuses/Orbits: Mucosal thickening in the right maxillary sinus, chronic. No acute finding. Other: None. IMPRESSION: 1.  No acute intracranial abnormality. 2. Unchanged atrophy and chronic small vessel ischemia. Electronically Signed   By: Jeb Levering M.D.   On: 01/07/2018 23:13   Mr Angiogram Head Wo Contrast  Result Date: 12/30/2017 CLINICAL DATA:  84 year old male with persistent vertigo, with progressive symptoms recently. Difficulty walking. Occasional double vision. EXAM: MRI HEAD WITHOUT CONTRAST MRA HEAD  WITHOUT CONTRAST MRA NECK WITHOUT CONTRAST TECHNIQUE: Multiplanar, multiecho pulse sequences of the brain and surrounding structures were obtained without intravenous contrast. Angiographic images of the Circle of Willis were obtained using MRA technique without intravenous contrast. Angiographic images of the neck were obtained using MRA technique without intravenous contrast. Carotid stenosis measurements (when applicable) are obtained utilizing NASCET criteria, using the distal internal carotid diameter as the denominator. COMPARISON:  Head CTs without contrast 11/15/2017 and earlier. Brain MRI 02/15/2016. Neck MRA 10/26/2004. FINDINGS: MRI HEAD FINDINGS Brain: Cerebral volume is not significantly changed since 2017. No restricted diffusion to suggest acute infarction. No midline shift, mass effect, evidence of mass lesion, ventriculomegaly, extra-axial collection or acute intracranial hemorrhage. Cervicomedullary junction and pituitary are within normal limits. Small chronic infarcts in both cerebellar hemispheres are stable. Chronic lacunar infarcts suspected in the central pons with associated chronic T2 hyperintensity. Minimal to mild for age superimposed chronic T2 heterogeneity in the deep gray matter nuclei. Mild to moderate for age patchy bilateral cerebral white matter T2 and FLAIR hyperintensity, mostly periventricular, has not significantly progressed. No cerebral cortical encephalomalacia or chronic cerebral blood products identified. Vascular: Major intracranial vascular flow voids are stable since 2017, the distal right vertebral artery demonstrates chronic slow flow or occlusion, see below. Dominant appearing distal left vertebral artery. Stable anterior circulation flow voids. Skull and upper cervical spine: Negative visible cervical spine. Normal bone marrow signal. Sinuses/Orbits: Combined fluid level and inspissated material within the right maxillary sinus is new since 2017. Other paranasal  sinuses remain well pneumatized. Stable orbits soft tissues. Other: Visible internal auditory structures appear normal. Mastoid air cells remain clear. Scalp and face soft tissues  appear negative. MRA NECK FINDINGS Time-of-flight neck MRA reveals antegrade flow in both cervical carotid arteries to the skull base. Irregularity at the carotid bifurcations is greater on the right, although no proximal right ICA stenosis is estimated at less than 50 % with respect to the distal vessel. Dominant appearance of the left vertebral artery throughout the neck is similar to the 2005 study. Antegrade flow signal in the smaller cervical right vertebral artery is faint but present. MRA HEAD FINDINGS There is flow signal in the distal right vertebral artery at the origin of the right PICA. There is severe irregularity and stenosis of the right V4 segment distal to the PICA (series 406, image 14). Normal flow signal in the dominant distal left vertebral artery which appears to supply the basilar. Patent basilar artery without stenosis. Patent AICA and SCA origins. Right posterior communicating artery is present while the left is diminutive or absent. There is only mild irregularity of the bilateral PCA branches which demonstrate symmetric distal flow signal. Antegrade flow in both ICA siphons. There is moderate bilateral ICA siphon irregularity compatible with atherosclerosis. Mild-to-moderate bilateral supraclinoid ICA segment stenosis results. Ophthalmic artery origins appear normal. Patent carotid termini. Normal MCA and ACA origins. Anterior communicating artery and visible ACA branches are within normal limits. Bilateral MCA M1 segments and MCA bifurcations appear normal. Visible bilateral MCA branches appear normal. IMPRESSION: 1.  No acute intracranial abnormality. 2. Stable noncontrast MRI appearance of the brain since 2017, notable for chronic small vessel disease primarily in the cerebellum and pons. 3. Severe chronic  atherosclerosis and stenosis of the distal right vertebral artery, although the right PICA origin remains patent. 4. Dominant left vertebral artery without stenosis, and otherwise negative posterior circulation. 5. Right greater than left carotid bifurcation, and bilateral ICA siphon atherosclerosis. But no definite hemodynamically significant carotid stenosis. And otherwise negative anterior circulation. 6. Acute on chronic right maxillary sinus inflammation is new since 2017. Electronically Signed   By: Genevie Ann M.D.   On: 12/30/2017 18:59   Mr Jodene Nam Neck Wo Contrast  Result Date: 12/30/2017 CLINICAL DATA:  84 year old male with persistent vertigo, with progressive symptoms recently. Difficulty walking. Occasional double vision. EXAM: MRI HEAD WITHOUT CONTRAST MRA HEAD WITHOUT CONTRAST MRA NECK WITHOUT CONTRAST TECHNIQUE: Multiplanar, multiecho pulse sequences of the brain and surrounding structures were obtained without intravenous contrast. Angiographic images of the Circle of Willis were obtained using MRA technique without intravenous contrast. Angiographic images of the neck were obtained using MRA technique without intravenous contrast. Carotid stenosis measurements (when applicable) are obtained utilizing NASCET criteria, using the distal internal carotid diameter as the denominator. COMPARISON:  Head CTs without contrast 11/15/2017 and earlier. Brain MRI 02/15/2016. Neck MRA 10/26/2004. FINDINGS: MRI HEAD FINDINGS Brain: Cerebral volume is not significantly changed since 2017. No restricted diffusion to suggest acute infarction. No midline shift, mass effect, evidence of mass lesion, ventriculomegaly, extra-axial collection or acute intracranial hemorrhage. Cervicomedullary junction and pituitary are within normal limits. Small chronic infarcts in both cerebellar hemispheres are stable. Chronic lacunar infarcts suspected in the central pons with associated chronic T2 hyperintensity. Minimal to mild for  age superimposed chronic T2 heterogeneity in the deep gray matter nuclei. Mild to moderate for age patchy bilateral cerebral white matter T2 and FLAIR hyperintensity, mostly periventricular, has not significantly progressed. No cerebral cortical encephalomalacia or chronic cerebral blood products identified. Vascular: Major intracranial vascular flow voids are stable since 2017, the distal right vertebral artery demonstrates chronic slow flow or occlusion, see below.  Dominant appearing distal left vertebral artery. Stable anterior circulation flow voids. Skull and upper cervical spine: Negative visible cervical spine. Normal bone marrow signal. Sinuses/Orbits: Combined fluid level and inspissated material within the right maxillary sinus is new since 2017. Other paranasal sinuses remain well pneumatized. Stable orbits soft tissues. Other: Visible internal auditory structures appear normal. Mastoid air cells remain clear. Scalp and face soft tissues appear negative. MRA NECK FINDINGS Time-of-flight neck MRA reveals antegrade flow in both cervical carotid arteries to the skull base. Irregularity at the carotid bifurcations is greater on the right, although no proximal right ICA stenosis is estimated at less than 50 % with respect to the distal vessel. Dominant appearance of the left vertebral artery throughout the neck is similar to the 2005 study. Antegrade flow signal in the smaller cervical right vertebral artery is faint but present. MRA HEAD FINDINGS There is flow signal in the distal right vertebral artery at the origin of the right PICA. There is severe irregularity and stenosis of the right V4 segment distal to the PICA (series 406, image 14). Normal flow signal in the dominant distal left vertebral artery which appears to supply the basilar. Patent basilar artery without stenosis. Patent AICA and SCA origins. Right posterior communicating artery is present while the left is diminutive or absent. There is only  mild irregularity of the bilateral PCA branches which demonstrate symmetric distal flow signal. Antegrade flow in both ICA siphons. There is moderate bilateral ICA siphon irregularity compatible with atherosclerosis. Mild-to-moderate bilateral supraclinoid ICA segment stenosis results. Ophthalmic artery origins appear normal. Patent carotid termini. Normal MCA and ACA origins. Anterior communicating artery and visible ACA branches are within normal limits. Bilateral MCA M1 segments and MCA bifurcations appear normal. Visible bilateral MCA branches appear normal. IMPRESSION: 1.  No acute intracranial abnormality. 2. Stable noncontrast MRI appearance of the brain since 2017, notable for chronic small vessel disease primarily in the cerebellum and pons. 3. Severe chronic atherosclerosis and stenosis of the distal right vertebral artery, although the right PICA origin remains patent. 4. Dominant left vertebral artery without stenosis, and otherwise negative posterior circulation. 5. Right greater than left carotid bifurcation, and bilateral ICA siphon atherosclerosis. But no definite hemodynamically significant carotid stenosis. And otherwise negative anterior circulation. 6. Acute on chronic right maxillary sinus inflammation is new since 2017. Electronically Signed   By: Genevie Ann M.D.   On: 12/30/2017 18:59   Mr Brain Wo Contrast  Result Date: 12/30/2017 CLINICAL DATA:  84 year old male with persistent vertigo, with progressive symptoms recently. Difficulty walking. Occasional double vision. EXAM: MRI HEAD WITHOUT CONTRAST MRA HEAD WITHOUT CONTRAST MRA NECK WITHOUT CONTRAST TECHNIQUE: Multiplanar, multiecho pulse sequences of the brain and surrounding structures were obtained without intravenous contrast. Angiographic images of the Circle of Willis were obtained using MRA technique without intravenous contrast. Angiographic images of the neck were obtained using MRA technique without intravenous contrast. Carotid  stenosis measurements (when applicable) are obtained utilizing NASCET criteria, using the distal internal carotid diameter as the denominator. COMPARISON:  Head CTs without contrast 11/15/2017 and earlier. Brain MRI 02/15/2016. Neck MRA 10/26/2004. FINDINGS: MRI HEAD FINDINGS Brain: Cerebral volume is not significantly changed since 2017. No restricted diffusion to suggest acute infarction. No midline shift, mass effect, evidence of mass lesion, ventriculomegaly, extra-axial collection or acute intracranial hemorrhage. Cervicomedullary junction and pituitary are within normal limits. Small chronic infarcts in both cerebellar hemispheres are stable. Chronic lacunar infarcts suspected in the central pons with associated chronic T2 hyperintensity. Minimal to  mild for age superimposed chronic T2 heterogeneity in the deep gray matter nuclei. Mild to moderate for age patchy bilateral cerebral white matter T2 and FLAIR hyperintensity, mostly periventricular, has not significantly progressed. No cerebral cortical encephalomalacia or chronic cerebral blood products identified. Vascular: Major intracranial vascular flow voids are stable since 2017, the distal right vertebral artery demonstrates chronic slow flow or occlusion, see below. Dominant appearing distal left vertebral artery. Stable anterior circulation flow voids. Skull and upper cervical spine: Negative visible cervical spine. Normal bone marrow signal. Sinuses/Orbits: Combined fluid level and inspissated material within the right maxillary sinus is new since 2017. Other paranasal sinuses remain well pneumatized. Stable orbits soft tissues. Other: Visible internal auditory structures appear normal. Mastoid air cells remain clear. Scalp and face soft tissues appear negative. MRA NECK FINDINGS Time-of-flight neck MRA reveals antegrade flow in both cervical carotid arteries to the skull base. Irregularity at the carotid bifurcations is greater on the right, although  no proximal right ICA stenosis is estimated at less than 50 % with respect to the distal vessel. Dominant appearance of the left vertebral artery throughout the neck is similar to the 2005 study. Antegrade flow signal in the smaller cervical right vertebral artery is faint but present. MRA HEAD FINDINGS There is flow signal in the distal right vertebral artery at the origin of the right PICA. There is severe irregularity and stenosis of the right V4 segment distal to the PICA (series 406, image 14). Normal flow signal in the dominant distal left vertebral artery which appears to supply the basilar. Patent basilar artery without stenosis. Patent AICA and SCA origins. Right posterior communicating artery is present while the left is diminutive or absent. There is only mild irregularity of the bilateral PCA branches which demonstrate symmetric distal flow signal. Antegrade flow in both ICA siphons. There is moderate bilateral ICA siphon irregularity compatible with atherosclerosis. Mild-to-moderate bilateral supraclinoid ICA segment stenosis results. Ophthalmic artery origins appear normal. Patent carotid termini. Normal MCA and ACA origins. Anterior communicating artery and visible ACA branches are within normal limits. Bilateral MCA M1 segments and MCA bifurcations appear normal. Visible bilateral MCA branches appear normal. IMPRESSION: 1.  No acute intracranial abnormality. 2. Stable noncontrast MRI appearance of the brain since 2017, notable for chronic small vessel disease primarily in the cerebellum and pons. 3. Severe chronic atherosclerosis and stenosis of the distal right vertebral artery, although the right PICA origin remains patent. 4. Dominant left vertebral artery without stenosis, and otherwise negative posterior circulation. 5. Right greater than left carotid bifurcation, and bilateral ICA siphon atherosclerosis. But no definite hemodynamically significant carotid stenosis. And otherwise negative  anterior circulation. 6. Acute on chronic right maxillary sinus inflammation is new since 2017. Electronically Signed   By: Genevie Ann M.D.   On: 12/30/2017 18:59   Dg Chest Port 1 View  Result Date: 01/07/2018 CLINICAL DATA:  84 y/o M; increased confusion and weakness over a few months. EXAM: PORTABLE CHEST 1 VIEW COMPARISON:  12/30/2017 chest radiograph FINDINGS: Stable normal cardiac silhouette given projection and technique. Aortic atherosclerosis with calcification. Improved aeration of right middle lobe. No new focal consolidation. Emphysema. No pleural effusion or pneumothorax. No acute osseous abnormality is evident. IMPRESSION: Improved aeration in right middle lobe. No new focal consolidation. Aortic atherosclerosis. Stable cardiac silhouette. Electronically Signed   By: Kristine Garbe M.D.   On: 01/07/2018 16:57    Microbiology: Recent Results (from the past 240 hour(s))  Blood culture (routine x 2)  Status: None   Collection Time: 01/12/18  6:15 PM  Result Value Ref Range Status   Specimen Description   Final    BLOOD RIGHT FOREARM Performed at [facility], [place] [address], [place], [place] [address]    Special Requests   Final    BOTTLES DRAWN AEROBIC AND ANAEROBIC Blood Culture adequate volume Performed at [place] [address], [place], [place] [address]    Culture   Final    NO GROWTH 5 DAYS Performed at [facility], [place] [address], [place], [place] [address]    Report Status 01/17/2018 FINAL  Final  Blood culture (routine x 2)     Status: None   Collection Time: 01/12/18  6:15 PM  Result Value Ref Range Status   Specimen Description   Final    BLOOD LEFT ANTECUBITAL Performed at [place] [address] [place], [place] [address]    Special Requests   Final    BOTTLES DRAWN AEROBIC AND ANAEROBIC Blood Culture adequate volume Performed at [place] [address]  [place], [place] [address]    Culture   Final    NO GROWTH 5 DAYS Performed at [facility], [place] [address] [place], [place] [address]    Report Status 01/17/2018 FINAL  Final  MRSA PCR Screening     Status: None   Collection Time: 01/13/18  6:55 AM  Result Value Ref Range Status   MRSA by PCR NEGATIVE NEGATIVE Final    Comment:        The GeneXpert MRSA Assay (FDA approved for NASAL specimens only), is one component of a comprehensive MRSA colonization surveillance program. It is not intended to diagnose MRSA infection nor to guide or monitor treatment for MRSA infections. Performed at [facility], [place] [address] [place], [place] [address]      Labs: Basic Metabolic Panel: Recent Labs  Lab 01/15/18 0526 01/16/18 0542  NA 141 138  K 4.4 4.8  CL 113* 112*  CO2 22 18*  GLUCOSE 150* 159*  BUN 36* 41*  CREATININE 1.95* 1.89*  CALCIUM 8.8* 8.8*   Liver Function Tests: No results for input(s): AST, ALT, ALKPHOS, BILITOT, PROT, ALBUMIN in the last 168 hours. No results for input(s): LIPASE, AMYLASE in the last 168 hours. No results for input(s): AMMONIA in the last 168 hours. CBC: No results for input(s): WBC, NEUTROABS, HGB, HCT, MCV, PLT in the last 168 hours. Cardiac Enzymes: No results for input(s): CKTOTAL, CKMB, CKMBINDEX, TROPONINI in the last 168 hours. BNP: BNP (last 3 results) No results for input(s): BNP in the last 8760 hours.  ProBNP (last 3 results) No results for input(s): PROBNP in the last 8760 hours.  CBG: Recent Labs  Lab 01/15/18 1103 01/15/18 1651 01/15/18 2125 01/16/18 0732 01/16/18 1141  GLUCAP 203* 203* 182* 192* 201*       Signed:  Domenic Polite MD.  [facility] 01/21/2018, 3:19 PM

## 2018-02-03 ENCOUNTER — Telehealth: Payer: Self-pay

## 2018-02-03 NOTE — Telephone Encounter (Signed)
I spoke with Rosalia Hammers from home health nursing. Patient was discharged from [facility] will have home health nurses coming to his home. I spoke with her to confirm that Dr. Loanne Drilling was patient's PCP & will be following him. They will fax over any orders needing to be signed.

## 2018-02-06 ENCOUNTER — Telehealth: Payer: Self-pay | Admitting: Endocrinology

## 2018-02-06 ENCOUNTER — Encounter: Payer: Self-pay | Admitting: *Deleted

## 2018-02-06 ENCOUNTER — Other Ambulatory Visit: Payer: Self-pay | Admitting: *Deleted

## 2018-02-06 NOTE — Telephone Encounter (Signed)
[facility] Dr Loanne Drilling that they will be going out to start services with patient on Monday March 25th

## 2018-02-06 NOTE — Patient Outreach (Signed)
[facility]) Care Management  02/06/2018  Chad Mcmillan February 26, 1924 [id number]   CSW had received referral from [facility] on 3/18 that patient was at [facility] for rehab. CSW then received message from [place] today that patient had discharged home on 3/21. CSW called patient's home, patient was resting per his wife, but CSW spoke with patient's wife, Chad Mcmillan who states that things have been going well since he has been home, no issues. CSW explained [facility] services and wife agreed to have [facility] RNCM follow-up for transition of care. CSW made referral & will close case from [place] standpoint as no CSW needs identified.    Raynaldo Opitz, LCSW [facility]  Clinical Social Worker cell #: [phone]

## 2018-02-06 NOTE — Patient Outreach (Signed)
[place] [facility]) Care Management [place] Telephone Outreach, Transition of Care day 1  02/06/2018  Chad Mcmillan [birth year] [id number]  Successful telephone outreach to Chad Mcmillan, 84 y/o male referred to [place] services for transition of care after multiple recent hospitalizations for acute metabolic encephalopathy: February 20-22, 2019: dehydration, failure to thrive, AKI, acute metabolic encephalopathy; patient was discharged home to self-care with home health services in place February 25- January 16, 2018: HCAP, dehydration with progressive confusion, multiple falls, acute metabolic encephalopathy; patient was discharged to SNF for rehabilitation ([facility])  Patient was discharged home from [facility] on February 05, 2018 with home health services in place [facility]  Patient has history including, but not limited to, dementia with behavior disturbance; prostate cancer; DM Type II; (L) BBB; NASH; CAD; HTN/ HLD; CKD- stage III; and previous CVA.  HIPAA/ identity verified with patient during phone call today; patient reports that he is doing fine, and denies pain, concerns, issues/ problems since SNF discharge.  As I attempted to explain [facility] CM services to patient, he stated that there were "too many people calling him" and he asked that I speak with his son Chad Mcmillan, who is noted to be listed on [facility] CM written consent.  Chad Mcmillan assumed the remainder of the conversation, and requested that he be contacted for all future phone calls, at [phone] (cell); Chad Mcmillan stated that he is unable to talk for very long today, as there "is so much going on," after patient's SNF discharge.  Explained that I was contacting patient for primary [facility] RN [place], and discussed [place] services with Chad Mcmillan; Chad Mcmillan provided verbal consent for services, but requested that we not call him back "until mid-late week next week" as he stated there was "so much going on," and his family "needs a  chance to settle in and settle down."  Chad Mcmillan reports today that "things are going fine" after patient's discharge home, and he denies medication concerns, stating that patient has and is taking all prescribed medications; he declines medication review by phone today, again stating time constraints, and assures me that there are no medication concerns.  Chad Mcmillan confirmed that he has heard from home health [place]) team, and that there is a [facility] visit scheduled with patient on Monday 02/09/18.  Reports that although patient and spouse live alone, he and his brother are "very involved" in patient's care and will be checking on patient daily.  Chad Mcmillan then again requested that we re-attempt telephone outreach "mid-late week, next week."  I explained that I would update [name] on today's successful telephone call, and that she would contact caregiver/ son again next week, as he requested today.  Patient's son Chad Mcmillan verbalized understanding and agreement with this plan.  Plan:  Will update patient's primary [facility] RN CM of today's successful telephone outreach to patient  Will make patient's PCP aware of [place] CM involvement in patient's care post-SNF discharge (will send barrier letter)  [facility] CM Care Plan Problem One     Most Recent Value  Care Plan Problem One  Risk for hospital re-admission related to/ as evidenced by multiple recent hospitalizations for acute metabolic encephalopathy with discharge to SNF for rehabilitation  Role Documenting the Problem One  Care Management [place] for Problem One  Active  [facility] Long Term Goal   Over the next 31 days, patient will not experience hospital readmission, as evidenced by patient/ caregiver reporting and review of EMR  during [place] outreach  [place] Term Goal Start Date  02/06/18  Interventions for Problem One Long Term Goal  Discussed with patient and with his son [facility] CM services, including how [facility] CM differs from Home health  services,  confirmed that home health services had been initiated and that patient has scheduled visit with home health team on Monday 02/09/18,  TOC program initiated     Oneta Rack, RN, BSN, [employer] Coordinator [facility] Care Management  [phone]

## 2018-02-08 ENCOUNTER — Other Ambulatory Visit: Payer: Self-pay | Admitting: Endocrinology

## 2018-02-10 ENCOUNTER — Telehealth: Payer: Self-pay | Admitting: Endocrinology

## 2018-02-10 NOTE — Telephone Encounter (Signed)
-----   Message from Dorna Leitz, [place] sent at 02/10/2018 11:14 AM EDT ----- Regarding: In home nursing I spoke with nurse from [place] she as needing verbal for nursing 2 times a week for three weeks? Ok to give verbal?

## 2018-02-10 NOTE — Telephone Encounter (Signed)
1.  OK 2.  F/u ov is due

## 2018-02-11 ENCOUNTER — Telehealth: Payer: Self-pay | Admitting: Endocrinology

## 2018-02-11 ENCOUNTER — Other Ambulatory Visit: Payer: Self-pay | Admitting: *Deleted

## 2018-02-11 NOTE — Patient Outreach (Signed)
[place] [facility]) Care Management  02/11/2018  Chad Mcmillan 01/19/1924 [id number]    Covering for Thea Silversmith, RN Transition of care (Unsuccessful)  RN attempted to contact pt son today Chad Mcmillan [phone]) however unsuccessful but able to leave a HIPAA approved voice message requesting a call back. Will report and requested another follow up call for tomorrow for ongoing [facility] services. Note Pecola Lawless started the transition of care template on 7/94 however did not completed and due to son requested a follow up call this week. Note pt has dementia and unable to converse this information. Instructions again are to contact the son Chad Mcmillan @[phone]) for and reintroduce the [facility] services for ongoing transition of care calls and possible home visits if needed. Pt will also need short term goals currently with one long term for hospital prevention. Also need medication review with list of discharge medications.   Raina Mina, RN Care Management Coordinator [place] Office [phone]

## 2018-02-11 NOTE — Telephone Encounter (Signed)
I called and scheduled patient f/u appointment as well with son, Louie Casa.

## 2018-02-11 NOTE — Telephone Encounter (Signed)
I called & patient was just calling to see what in home healthcare would be coming in to care for his dad.

## 2018-02-11 NOTE — Telephone Encounter (Signed)
Patients son is calling back with a couple of questions.    (

## 2018-02-11 NOTE — Telephone Encounter (Signed)
I called and gave Melissa at [facility] verbal nursing orders.

## 2018-02-12 ENCOUNTER — Other Ambulatory Visit: Payer: Self-pay

## 2018-02-12 ENCOUNTER — Encounter: Payer: Self-pay | Admitting: Endocrinology

## 2018-02-12 ENCOUNTER — Ambulatory Visit (INDEPENDENT_AMBULATORY_CARE_PROVIDER_SITE_OTHER): Payer: Medicare Other | Admitting: Endocrinology

## 2018-02-12 VITALS — BP 182/82 | HR 74 | Wt 192.4 lb

## 2018-02-12 DIAGNOSIS — I2510 Atherosclerotic heart disease of native coronary artery without angina pectoris: Secondary | ICD-10-CM

## 2018-02-12 DIAGNOSIS — N289 Disorder of kidney and ureter, unspecified: Secondary | ICD-10-CM | POA: Diagnosis not present

## 2018-02-12 DIAGNOSIS — D649 Anemia, unspecified: Secondary | ICD-10-CM | POA: Diagnosis not present

## 2018-02-12 LAB — CBC WITH DIFFERENTIAL/PLATELET
Basophils Absolute: 0 10*3/uL (ref 0.0–0.1)
Basophils Relative: 0.4 % (ref 0.0–3.0)
EOS PCT: 1.9 % (ref 0.0–5.0)
Eosinophils Absolute: 0.1 10*3/uL (ref 0.0–0.7)
HCT: 29.4 % — ABNORMAL LOW (ref 39.0–52.0)
HEMOGLOBIN: 9.9 g/dL — AB (ref 13.0–17.0)
LYMPHS ABS: 1.1 10*3/uL (ref 0.7–4.0)
Lymphocytes Relative: 25.1 % (ref 12.0–46.0)
MCHC: 33.5 g/dL (ref 30.0–36.0)
MCV: 96.1 fl (ref 78.0–100.0)
MONO ABS: 0.3 10*3/uL (ref 0.1–1.0)
MONOS PCT: 7.5 % (ref 3.0–12.0)
NEUTROS PCT: 65.1 % (ref 43.0–77.0)
Neutro Abs: 3 10*3/uL (ref 1.4–7.7)
Platelets: 107 10*3/uL — ABNORMAL LOW (ref 150.0–400.0)
RBC: 3.07 Mil/uL — AB (ref 4.22–5.81)
RDW: 13.9 % (ref 11.5–15.5)
WBC: 4.5 10*3/uL (ref 4.0–10.5)

## 2018-02-12 MED ORDER — DONEPEZIL HCL 5 MG PO TABS: 5.0000 mg | ORAL_TABLET | Freq: Every day | ORAL | 11 refills | Status: DC

## 2018-02-12 MED ORDER — INSULIN NPH (HUMAN) (ISOPHANE) 100 UNIT/ML ~~LOC~~ SUSP: 5.0000 [IU] | Freq: Every day | SUBCUTANEOUS | 2 refills | Status: DC

## 2018-02-12 MED ORDER — LORAZEPAM 0.5 MG PO TABS: 0.5000 mg | ORAL_TABLET | Freq: Two times a day (BID) | ORAL | 3 refills | Status: DC | PRN

## 2018-02-12 MED ORDER — AMLODIPINE BESYLATE 5 MG PO TABS: 5.0000 mg | ORAL_TABLET | Freq: Every day | ORAL | 5 refills | Status: DC

## 2018-02-12 MED ORDER — DONEPEZIL HCL 5 MG PO TABS
5.0000 mg | ORAL_TABLET | Freq: Every day | ORAL | 11 refills | Status: DC
Start: 2018-02-12 — End: 2018-02-12

## 2018-02-12 MED ORDER — INSULIN LISPRO 100 UNIT/ML ~~LOC~~ SOLN: SUBCUTANEOUS | 1 refills | Status: DC

## 2018-02-12 NOTE — Patient Instructions (Signed)
blood tests are requested for you today.  We'll let you know about the results. Please continue the same medications for now. Please come back for a follow-up appointment in 2 months.

## 2018-02-12 NOTE — Progress Notes (Signed)
Subjective:    Patient ID: Chad Mcmillan, male    DOB: 04-18-1924, 84 y.o.   MRN: [id number]  HPI  The state of at least three ongoing medical problems is addressed today, with interval history of each noted here: Pt returns for f/u of diabetes mellitus:  DM type: Insulin-requiring type 2 Dx'ed: 1989.   Complications: renal insuff, CAD, retinopathy, polyneuropathy, and CVA.   Therapy: insulin since 2004.  DKA: never.  Severe hypoglycemia: never.  Pancreatitis: never.  Other: no cbg record, but states cbg's are well-controlled.  This is a stable problem. Memory loss is improved.  This is a stable problem. Renal failure: he denies dysuria.  This is a stable problem. Anxiety: pt says better on lorazepam.  This is a stable problem. Past Medical History:  Diagnosis Date  . ADENOCARCINOMA, PROSTATE 06/01/2008  . CORONARY ARTERY DISEASE 06/13/2007  . CVA 06/01/2008   x 2  . DIABETES MELLITUS, TYPE I, WITH OPHTHALMIC COMPLICATIONS 7/98/9211  . DM retinopathy ([place])   . HYPERKALEMIA 06/01/2008   due to DM neparopathy  . HYPERLIPIDEMIA 06/13/2007  . HYPERTENSION 06/13/2007  . NEOPLASM, MALIGNANT, SKIN, FACE 06/01/2008  . Other chronic nonalcoholic liver disease 9/41/7408  . RENAL DISEASE 06/01/2008    Past Surgical History:  Procedure Laterality Date  . Carotid Duplex  09/12/2004  . CATARACT EXTRACTION    . CHOLECYSTECTOMY    . ESOPHAGOGASTRODUODENOSCOPY  06/28/1991  . HERNIA REPAIR     right Inguinal hernia  . Rest/Stress Cardiolite  01/22/2001    Social History   Socioeconomic History  . Marital status: Married    Spouse name: Not on file  . Number of children: Not on file  . Years of education: Not on file  . Highest education level: Not on file  Occupational History  . Occupation: Retired    Fish farm manager: RETIRED  Social Needs  . Financial resource strain: Not on file  . Food insecurity:    Worry: Not on file    Inability: Not on file  . Transportation needs:    Medical:  Not on file    Non-medical: Not on file  Tobacco Use  . Smoking status: Former Smoker    Last attempt to quit: 11/18/1978    Years since quitting: 39.2  . Smokeless tobacco: Never Used  Substance and Sexual Activity  . Alcohol use: No  . Drug use: No  . Sexual activity: Never    Birth control/protection: None  Lifestyle  . Physical activity:    Days per week: Not on file    Minutes per session: Not on file  . Stress: Not on file  Relationships  . Social connections:    Talks on phone: Not on file    Gets together: Not on file    Attends religious service: Not on file    Active member of club or organization: Not on file    Attends meetings of clubs or organizations: Not on file    Relationship status: Not on file  . Intimate partner violence:    Fear of current or ex partner: Not on file    Emotionally abused: Not on file    Physically abused: Not on file    Forced sexual activity: Not on file  Other Topics Concern  . Not on file  Social History Narrative  . Not on file    Current Outpatient Medications on File Prior to Visit  Medication Sig Dispense Refill  . Acetaminophen 500 MG coapsule  Take 500-1,000 mg by mouth every 6 (six) hours as needed for headache (pain).    Mar[place] Kitchen aspirin EC 81 MG tablet Take 1 tablet (81 mg total) by mouth daily. 90 tablet 3  . meclizine (ANTIVERT) 12.5 MG tablet TAKE 1 TABLET BY MOUTH THREE TIMES DAILY AS NEEDED FOR DIZZINESS 30 tablet 3  . nitroGLYCERIN (NITROSTAT) 0.4 MG SL tablet Place 1 tablet (0.4 mg total) under the tongue every 5 (five) minutes as needed. For chest pain 25 tablet 3  . pravastatin (PRAVACHOL) 80 MG tablet TAKE 1 TABLET BY MOUTH ONCE DAILY (Patient taking differently: TAKE 80 mg  TABLET BY MOUTH ONCE DAILY) 30 tablet 5   No current facility-administered medications on file prior to visit.     No Known Allergies  History reviewed. No pertinent family history.  BP (!) 182/82 (BP Location: Left Arm, Patient Position:  Sitting, Cuff Size: Normal)   Pulse 74   Wt 192 lb 6.4 oz (87.3 kg)   SpO2 98%   BMI 25.38 kg/m   Review of Systems Denies fever, hypoglycemia, and sob.      Objective:   Physical Exam Vital signs: see vs page. Gen: elderly, frail, no distress.  LUNGS:  Clear to auscultation, except for rales at the left base.  EXT: 1+ bilat leg edema PSYCH: alert, oriented to self and place.  Says it is April, 2019.   Chart review: Ca++ was low in the hospital    Assessment & Plan:  Renal failure: recheck today.  Insulin-requiring type 2 DM, with CAD: apparently well-controlled.  Anemia: recheck today.  Hypocalcemia: recheck today.    Patient Instructions  blood tests are requested for you today.  We'll let you know about the results. Please continue the same medications for now. Please come back for a follow-up appointment in 2 months.

## 2018-02-13 LAB — VITAMIN D 25 HYDROXY (VIT D DEFICIENCY, FRACTURES): VITD: 9.22 ng/mL — ABNORMAL LOW (ref 30.00–100.00)

## 2018-02-13 LAB — BASIC METABOLIC PANEL
BUN: 35 mg/dL — AB (ref 6–23)
CALCIUM: 8.3 mg/dL — AB (ref 8.4–10.5)
CO2: 23 meq/L (ref 19–32)
Chloride: 109 mEq/L (ref 96–112)
Creatinine, Ser: 1.92 mg/dL — ABNORMAL HIGH (ref 0.40–1.50)
GFR: 35 mL/min — AB (ref >60.00)
GLUCOSE: 252 mg/dL — AB (ref 70–99)
Potassium: 5.2 mEq/L — ABNORMAL HIGH (ref 3.5–5.1)
SODIUM: 140 meq/L (ref 135–145)

## 2018-02-13 LAB — PTH, INTACT AND CALCIUM
Calcium: 8.4 mg/dL — ABNORMAL LOW (ref 8.6–10.3)
PTH: 158 pg/mL — ABNORMAL HIGH (ref 14–64)

## 2018-02-13 LAB — IBC PANEL
Iron: 55 ug/dL (ref 42–165)
SATURATION RATIOS: 20 % (ref 20.0–50.0)
Transferrin: 196 mg/dL — ABNORMAL LOW (ref 212.0–360.0)

## 2018-02-13 MED ORDER — ERGOCALCIFEROL 1.25 MG (50000 UT) PO CAPS: 50000.0000 [IU] | ORAL_CAPSULE | ORAL | 0 refills | Status: DC

## 2018-02-16 ENCOUNTER — Telehealth: Payer: Self-pay | Admitting: Endocrinology

## 2018-02-16 NOTE — Telephone Encounter (Signed)
ok 

## 2018-02-16 NOTE — Telephone Encounter (Signed)
LVM for Cathy to call back so I could give verbal orders.

## 2018-02-16 NOTE — Telephone Encounter (Signed)
[facility] is calling for Verbal orders to start speech therapy with patient.    Please advise [phone] Jamelle Haring.

## 2018-02-19 ENCOUNTER — Telehealth: Payer: Self-pay | Admitting: Endocrinology

## 2018-02-19 NOTE — Telephone Encounter (Signed)
I have called [facility] & given verbal orders.

## 2018-02-19 NOTE — Telephone Encounter (Signed)
[facility] is calling to get verbal orders that it is okay for them to bandaged a cut on patients hand from where he fell this morning. Please advise   [phone]

## 2018-02-19 NOTE — Telephone Encounter (Signed)
OK 

## 2018-02-26 ENCOUNTER — Other Ambulatory Visit: Payer: Self-pay

## 2018-02-26 NOTE — Patient Outreach (Signed)
[facility]) Care Management  02/26/2018  Chad Mcmillan [birth year] [id number]  Assessment: 84 year old with recent discharge from skilled facility [facility]. History of HCAP, dehydration, Dementia, Diabetes, HTN, Failure to thrive.  Per chart: Admission 01/07/18-2/22 Failure to Thrive, dehydration, AKI Admission 2/25-3/1 HCAP, dehydration discharged to Skilled facility. Discharged from skilled facility on 3/21.  RNCM called to follow up regarding transition of care. Someone asnwered the phone stating he was Chad Mcmillan, but would not confirm date of birth. RNCM asked to speak with Chad Mcmillan and he states, "my wife has dementia". RNCM asked to speak with his son Chad Mcmillan. Client reports Chad Mcmillan is out mowing the yard. Unable to leave a message with client.  Plan: per policy/procedure, send a letter follow up call within 3-4 business days.  Thea Silversmith, RN, MSN, [place] Coordinator Cell: [phone]

## 2018-02-27 ENCOUNTER — Emergency Department (HOSPITAL_COMMUNITY): Payer: Medicare Other

## 2018-02-27 ENCOUNTER — Other Ambulatory Visit: Payer: Self-pay

## 2018-02-27 ENCOUNTER — Inpatient Hospital Stay (HOSPITAL_COMMUNITY)
Admission: EM | Admit: 2018-02-27 | Discharge: 2018-03-02 | DRG: 280 | Disposition: A | Discharge status: Home Health | Payer: Medicare Other | Attending: Internal Medicine | Admitting: Internal Medicine

## 2018-02-27 ENCOUNTER — Encounter (HOSPITAL_COMMUNITY): Payer: Self-pay | Admitting: Radiology

## 2018-02-27 DIAGNOSIS — R531 Weakness: Secondary | ICD-10-CM | POA: Diagnosis present

## 2018-02-27 DIAGNOSIS — E1051 Type 1 diabetes mellitus with diabetic peripheral angiopathy without gangrene: Secondary | ICD-10-CM | POA: Diagnosis present

## 2018-02-27 DIAGNOSIS — Z7982 Long term (current) use of aspirin: Secondary | ICD-10-CM | POA: Diagnosis not present

## 2018-02-27 DIAGNOSIS — Y95 Nosocomial condition: Secondary | ICD-10-CM | POA: Diagnosis present

## 2018-02-27 DIAGNOSIS — E10319 Type 1 diabetes mellitus with unspecified diabetic retinopathy without macular edema: Secondary | ICD-10-CM | POA: Diagnosis present

## 2018-02-27 DIAGNOSIS — I340 Nonrheumatic mitral (valve) insufficiency: Secondary | ICD-10-CM | POA: Diagnosis not present

## 2018-02-27 DIAGNOSIS — Z8546 Personal history of malignant neoplasm of prostate: Secondary | ICD-10-CM | POA: Diagnosis not present

## 2018-02-27 DIAGNOSIS — F0391 Unspecified dementia with behavioral disturbance: Secondary | ICD-10-CM | POA: Diagnosis present

## 2018-02-27 DIAGNOSIS — E785 Hyperlipidemia, unspecified: Secondary | ICD-10-CM | POA: Diagnosis present

## 2018-02-27 DIAGNOSIS — N184 Chronic kidney disease, stage 4 (severe): Secondary | ICD-10-CM | POA: Diagnosis present

## 2018-02-27 DIAGNOSIS — Z85828 Personal history of other malignant neoplasm of skin: Secondary | ICD-10-CM | POA: Diagnosis not present

## 2018-02-27 DIAGNOSIS — E1065 Type 1 diabetes mellitus with hyperglycemia: Secondary | ICD-10-CM | POA: Diagnosis present

## 2018-02-27 DIAGNOSIS — I447 Left bundle-branch block, unspecified: Secondary | ICD-10-CM | POA: Diagnosis present

## 2018-02-27 DIAGNOSIS — E875 Hyperkalemia: Secondary | ICD-10-CM | POA: Diagnosis present

## 2018-02-27 DIAGNOSIS — I2510 Atherosclerotic heart disease of native coronary artery without angina pectoris: Secondary | ICD-10-CM

## 2018-02-27 DIAGNOSIS — Z9849 Cataract extraction status, unspecified eye: Secondary | ICD-10-CM | POA: Diagnosis not present

## 2018-02-27 DIAGNOSIS — K769 Liver disease, unspecified: Secondary | ICD-10-CM | POA: Diagnosis present

## 2018-02-27 DIAGNOSIS — Z87891 Personal history of nicotine dependence: Secondary | ICD-10-CM

## 2018-02-27 DIAGNOSIS — J189 Pneumonia, unspecified organism: Secondary | ICD-10-CM

## 2018-02-27 DIAGNOSIS — I129 Hypertensive chronic kidney disease with stage 1 through stage 4 chronic kidney disease, or unspecified chronic kidney disease: Secondary | ICD-10-CM | POA: Diagnosis present

## 2018-02-27 DIAGNOSIS — Z794 Long term (current) use of insulin: Secondary | ICD-10-CM | POA: Diagnosis not present

## 2018-02-27 DIAGNOSIS — I214 Non-ST elevation (NSTEMI) myocardial infarction: Principal | ICD-10-CM | POA: Diagnosis present

## 2018-02-27 DIAGNOSIS — Z9049 Acquired absence of other specified parts of digestive tract: Secondary | ICD-10-CM | POA: Diagnosis not present

## 2018-02-27 DIAGNOSIS — E1022 Type 1 diabetes mellitus with diabetic chronic kidney disease: Secondary | ICD-10-CM | POA: Diagnosis present

## 2018-02-27 DIAGNOSIS — D696 Thrombocytopenia, unspecified: Secondary | ICD-10-CM | POA: Diagnosis present

## 2018-02-27 DIAGNOSIS — Z8673 Personal history of transient ischemic attack (TIA), and cerebral infarction without residual deficits: Secondary | ICD-10-CM | POA: Diagnosis not present

## 2018-02-27 DIAGNOSIS — I4891 Unspecified atrial fibrillation: Secondary | ICD-10-CM | POA: Diagnosis present

## 2018-02-27 DIAGNOSIS — D631 Anemia in chronic kidney disease: Secondary | ICD-10-CM | POA: Diagnosis present

## 2018-02-27 LAB — TROPONIN I
TROPONIN I: 0.06 ng/mL — AB (ref <0.03)
Troponin I: 0.03 ng/mL (ref <0.03)
Troponin I: 0.2 ng/mL (ref <0.03)

## 2018-02-27 LAB — COMPREHENSIVE METABOLIC PANEL
ALBUMIN: 3.6 g/dL (ref 3.5–5.0)
ALT: 20 U/L (ref 17–63)
AST: 37 U/L (ref 15–41)
Alkaline Phosphatase: 111 U/L (ref 38–126)
Anion gap: 14 (ref 5–15)
BUN: 36 mg/dL — AB (ref 6–20)
CO2: 18 mmol/L — ABNORMAL LOW (ref 22–32)
Calcium: 9.2 mg/dL (ref 8.9–10.3)
Chloride: 111 mmol/L (ref 101–111)
Creatinine, Ser: 2.06 mg/dL — ABNORMAL HIGH (ref 0.61–1.24)
GFR calc Af Amer: 31 mL/min — ABNORMAL LOW (ref >60)
GFR calc non Af Amer: 27 mL/min — ABNORMAL LOW (ref >60)
GLUCOSE: 311 mg/dL — AB (ref 65–99)
POTASSIUM: 5.6 mmol/L — AB (ref 3.5–5.1)
Sodium: 143 mmol/L (ref 135–145)
TOTAL PROTEIN: 7 g/dL (ref 6.5–8.1)
Total Bilirubin: 1 mg/dL (ref 0.3–1.2)

## 2018-02-27 LAB — CBC WITH DIFFERENTIAL/PLATELET
Basophils Absolute: 0 10*3/uL (ref 0.0–0.1)
Basophils Relative: 0 %
Eosinophils Absolute: 0 10*3/uL (ref 0.0–0.7)
Eosinophils Relative: 0 %
HEMATOCRIT: 30.7 % — AB (ref 39.0–52.0)
HEMOGLOBIN: 10.1 g/dL — AB (ref 13.0–17.0)
LYMPHS ABS: 0.6 10*3/uL — AB (ref 0.7–4.0)
Lymphocytes Relative: 8 %
MCH: 31.3 pg (ref 26.0–34.0)
MCHC: 32.9 g/dL (ref 30.0–36.0)
MCV: 95 fL (ref 78.0–100.0)
MONOS PCT: 4 %
Monocytes Absolute: 0.3 10*3/uL (ref 0.1–1.0)
NEUTROS ABS: 6.8 10*3/uL (ref 1.7–7.7)
NEUTROS PCT: 88 %
Platelets: 101 10*3/uL — ABNORMAL LOW (ref 150–400)
RBC: 3.23 MIL/uL — AB (ref 4.22–5.81)
RDW: 13.6 % (ref 11.5–15.5)
WBC: 7.7 10*3/uL (ref 4.0–10.5)

## 2018-02-27 LAB — I-STAT CHEM 8, ED
BUN: 38 mg/dL — AB (ref 6–20)
CHLORIDE: 110 mmol/L (ref 101–111)
Calcium, Ion: 1.06 mmol/L — ABNORMAL LOW (ref 1.15–1.40)
Creatinine, Ser: 2 mg/dL — ABNORMAL HIGH (ref 0.61–1.24)
Glucose, Bld: 312 mg/dL — ABNORMAL HIGH (ref 65–99)
HEMATOCRIT: 32 % — AB (ref 39.0–52.0)
Hemoglobin: 10.9 g/dL — ABNORMAL LOW (ref 13.0–17.0)
POTASSIUM: 5.3 mmol/L — AB (ref 3.5–5.1)
Sodium: 141 mmol/L (ref 135–145)
TCO2: 23 mmol/L (ref 22–32)

## 2018-02-27 LAB — URINALYSIS, ROUTINE W REFLEX MICROSCOPIC
BACTERIA UA: NONE SEEN
BILIRUBIN URINE: NEGATIVE
Glucose, UA: >=500 mg/dL — AB
KETONES UR: 5 mg/dL — AB
LEUKOCYTES UA: NEGATIVE
NITRITE: NEGATIVE
Specific Gravity, Urine: 1.01 (ref 1.005–1.030)
pH: 7 (ref 5.0–8.0)

## 2018-02-27 LAB — I-STAT TROPONIN, ED: Troponin i, poc: 0.04 ng/mL (ref 0.00–0.08)

## 2018-02-27 LAB — CBG MONITORING, ED: Glucose-Capillary: 290 mg/dL — ABNORMAL HIGH (ref 65–99)

## 2018-02-27 LAB — HEMOGLOBIN A1C
Hgb A1c MFr Bld: 6.5 % — ABNORMAL HIGH (ref 4.8–5.6)
Mean Plasma Glucose: 139.85 mg/dL

## 2018-02-27 LAB — MAGNESIUM: Magnesium: 1.9 mg/dL (ref 1.7–2.4)

## 2018-02-27 LAB — GLUCOSE, CAPILLARY
Glucose-Capillary: 183 mg/dL — ABNORMAL HIGH (ref 65–99)
Glucose-Capillary: 112 mg/dL — ABNORMAL HIGH (ref 65–99)

## 2018-02-27 MED ORDER — AMLODIPINE BESYLATE 5 MG PO TABS
5.0000 mg | ORAL_TABLET | Freq: Every day | ORAL | Status: DC
  Administered 2018-02-27 – 2018-03-02 (×4): 5 mg via ORAL
  Filled 2018-02-27 (×4): qty 1

## 2018-02-27 MED ORDER — LACTATED RINGERS IV SOLN
INTRAVENOUS | Status: DC
  Administered 2018-02-27 – 2018-02-28 (×2): via INTRAVENOUS

## 2018-02-27 MED ORDER — ASPIRIN 81 MG PO CHEW
324.0000 mg | CHEWABLE_TABLET | Freq: Once | ORAL | Status: AC
  Administered 2018-02-27: 324 mg via ORAL
  Filled 2018-02-27: qty 4

## 2018-02-27 MED ORDER — INSULIN NPH (HUMAN) (ISOPHANE) 100 UNIT/ML ~~LOC~~ SUSP
5.0000 [IU] | Freq: Every day | SUBCUTANEOUS | Status: DC
Start: 2018-02-27 — End: 2018-03-02
  Administered 2018-02-28 – 2018-03-01 (×2): 5 [IU] via SUBCUTANEOUS
  Filled 2018-02-27: qty 10

## 2018-02-27 MED ORDER — SODIUM CHLORIDE 0.9 % IV SOLN
1.0000 g | Freq: Once | INTRAVENOUS | Status: DC
  Filled 2018-02-27: qty 1

## 2018-02-27 MED ORDER — VITAMIN D (ERGOCALCIFEROL) 1.25 MG (50000 UNIT) PO CAPS
50000.0000 [IU] | ORAL_CAPSULE | ORAL | Status: DC
Start: 2018-02-27 — End: 2018-03-02
  Administered 2018-03-02: 50000 [IU] via ORAL
  Filled 2018-02-27 (×2): qty 1

## 2018-02-27 MED ORDER — INSULIN ASPART 100 UNIT/ML ~~LOC~~ SOLN
0.0000 [IU] | Freq: Three times a day (TID) | SUBCUTANEOUS | Status: DC
  Administered 2018-02-28: 3 [IU] via SUBCUTANEOUS
  Administered 2018-02-28 (×2): 1 [IU] via SUBCUTANEOUS
  Administered 2018-03-01 – 2018-03-02 (×2): 2 [IU] via SUBCUTANEOUS

## 2018-02-27 MED ORDER — ENOXAPARIN SODIUM 30 MG/0.3ML ~~LOC~~ SOLN
30.0000 mg | SUBCUTANEOUS | Status: DC
  Administered 2018-02-27: 30 mg via SUBCUTANEOUS
  Filled 2018-02-27: qty 0.3

## 2018-02-27 MED ORDER — PRAVASTATIN SODIUM 40 MG PO TABS
80.0000 mg | ORAL_TABLET | Freq: Every day | ORAL | Status: DC
  Administered 2018-02-27: 80 mg via ORAL
  Filled 2018-02-27: qty 4

## 2018-02-27 MED ORDER — ASPIRIN EC 81 MG PO TBEC
81.0000 mg | DELAYED_RELEASE_TABLET | Freq: Every day | ORAL | Status: DC
  Administered 2018-02-28 – 2018-03-02 (×3): 81 mg via ORAL
  Filled 2018-02-27 (×3): qty 1

## 2018-02-27 MED ORDER — SODIUM CHLORIDE 0.9 % IV SOLN
2.0000 g | Freq: Once | INTRAVENOUS | Status: AC
  Administered 2018-02-27: 2 g via INTRAVENOUS
  Filled 2018-02-27: qty 2

## 2018-02-27 MED ORDER — LACTATED RINGERS IV BOLUS: 1000.0000 mL | Freq: Once | INTRAVENOUS | Status: DC

## 2018-02-27 MED ORDER — VANCOMYCIN HCL 10 G IV SOLR
1500.0000 mg | Freq: Once | INTRAVENOUS | Status: AC
  Administered 2018-02-27: 1500 mg via INTRAVENOUS
  Filled 2018-02-27: qty 1500

## 2018-02-27 MED ORDER — INSULIN ASPART 100 UNIT/ML ~~LOC~~ SOLN: 0.0000 [IU] | Freq: Every day | SUBCUTANEOUS | Status: DC

## 2018-02-27 MED ORDER — DONEPEZIL HCL 5 MG PO TABS: 5.0000 mg | ORAL_TABLET | Freq: Every day | ORAL | Status: DC

## 2018-02-27 MED ORDER — INSULIN ASPART 100 UNIT/ML ~~LOC~~ SOLN
5.0000 [IU] | Freq: Three times a day (TID) | SUBCUTANEOUS | Status: DC
  Administered 2018-02-28 – 2018-03-02 (×5): 5 [IU] via SUBCUTANEOUS

## 2018-02-27 MED ORDER — SODIUM CHLORIDE 0.9 % IV BOLUS
1000.0000 mL | Freq: Once | INTRAVENOUS | Status: AC
  Administered 2018-02-27: 1000 mL via INTRAVENOUS

## 2018-02-27 MED ORDER — VANCOMYCIN HCL 10 G IV SOLR: 1500.0000 mg | INTRAVENOUS | Status: DC

## 2018-02-27 MED ORDER — LORAZEPAM 0.5 MG PO TABS
0.5000 mg | ORAL_TABLET | Freq: Two times a day (BID) | ORAL | Status: DC | PRN
  Administered 2018-02-28: 0.5 mg via ORAL
  Filled 2018-02-27: qty 1

## 2018-02-27 MED ORDER — CEFEPIME HCL 1 G IJ SOLR
1.0000 g | INTRAMUSCULAR | Status: DC
  Filled 2018-02-27: qty 1

## 2018-02-27 NOTE — Care Management Note (Signed)
Case Management Note  Patient Details  Name: Chad Mcmillan MRN: [id number] Date of Birth: 20-Dec-1923  Pt is currently active with [facility].  Contacted Mike with [facility] who advised pt has [facility]  Expected Discharge Date:  (unknown)               Expected Discharge Plan:  [place] Acute Care Choice:  Home Health Choice offered to:  Patient  HH Arranged:   RN PT OT SLP [facility] Agency:  [facility]  Status of Service:  In process, will continue to follow  Rae Mar, RN 02/27/2018, 4:10 PM

## 2018-02-27 NOTE — ED Notes (Signed)
Patient transported to CT 

## 2018-02-27 NOTE — H&P (Signed)
History and Physical    Chad Mcmillan CHY:[id number] DOB: September 11, 1924 DOA: 02/27/2018  PCP: [name], MD  Patient coming from: home  I have personally briefly reviewed patient's old medical records in [place]  Chief Complaint: weakness  HPI: Chad Mcmillan is Chad Mcmillan 84 y.o. male with medical history significant of diabetes, coronary disease, stroke, hypertension, hyperlipidemia presenting with Chad Mcmillan recent fall and weakness with CT findings concerning for recurrent pneumonia.   History of obtained with assistance from the patient's son due to the patient's dementia.  Last night the patient needs to use the bathroom, but when he tried to get up he fell.  He thought that his sugar dropped and he asked his wife to bring him some sweet and low.  He apparently ate an entire box of sweet low due to this concern.  He vomited approximately twice after this.  No blood.  After falling it took him Chad Mcmillan while to pull himself up and he ended up falling again.  He was unable to get back up after that second time that he fell.  It is Chad Mcmillan little bit unclear how long he was on the ground for if there was ever Chad Mcmillan loss of consciousness.  His son notes that that part is not clear.  Chad Mcmillan denies ever having any chest pain or shortness of breath.  He denies any pain in general.  He denies any abdominal pain, diarrhea.  He notes numbness from the knees down for the past 6 months.  He was recently discharged after course of healthcare associated pneumonia.  His son notes that after this stay at the skilled nursing facility he seemed to be stronger, but that over the past couple weeks his balance and speech have not been as good and that he seems to be declining.  ED Course: Labs, EKG, imaging concerning for pneumonia.  Admit to hospitalist for pneumonia.    Review of Systems: As per HPI otherwise 10 point review of systems negative.   Past Medical History:  Diagnosis Date  . ADENOCARCINOMA, PROSTATE 06/01/2008  . CORONARY  ARTERY DISEASE 06/13/2007  . CVA 06/01/2008   x 2  . DIABETES MELLITUS, TYPE I, WITH OPHTHALMIC COMPLICATIONS 8/78/6767  . 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 HYPERKALEMIA 06/01/2008   due to DM neparopathy  . HYPERLIPIDEMIA 06/13/2007  . HYPERTENSION 06/13/2007  . NEOPLASM, MALIGNANT, SKIN, FACE 06/01/2008  . Other chronic nonalcoholic liver disease 12/28/4707  . RENAL DISEASE 06/01/2008    Past Surgical History:  Procedure Laterality Date  . Carotid Duplex  09/12/2004  . CATARACT EXTRACTION    . CHOLECYSTECTOMY    . ESOPHAGOGASTRODUODENOSCOPY  06/28/1991  . HERNIA REPAIR     right Inguinal hernia  . Rest/Stress Cardiolite  01/22/2001     reports that he quit smoking about 39 years ago. He has never used smokeless tobacco. He reports that he does not drink alcohol or use drugs.  No Known Allergies  No family history on file.  Prior to Admission medications   Medication Sig Start Date End Date Taking? Authorizing Provider  amLODipine (NORVASC) 5 MG tablet Take 1 tablet (5 mg total) by mouth daily. 02/12/18  Yes [name], MD  aspirin EC 81 MG tablet Take 1 tablet (81 mg total) by mouth daily. 08/04/17  Yes [name], MD  donepezil (ARICEPT) 5 MG tablet Take 1 tablet (5 mg total) by mouth at bedtime. 02/12/18  Yes [name], MD  ergocalciferol (VITAMIN D2)  50000 units capsule Take 1 capsule (50,000 Units total) by mouth 3 (three) times Chad Wohlfarth week. 02/13/18  Yes [name], MD  insulin lispro (HUMALOG) 100 UNIT/ML injection 3 times [name] day (just before each meal), 15-10-10 units 02/12/18  Yes [name], MD  insulin NPH Human (HUMULIN N) 100 UNIT/ML injection Inject 0.05 mLs (5 Units total) into the skin at bedtime. 02/12/18  Yes [name], MD  LORazepam (ATIVAN) 0.5 MG tablet Take 1 tablet (0.5 mg total) by mouth 2 (two) times daily as needed for anxiety. 02/12/18  Yes [name], MD  meclizine (ANTIVERT) 12.5 MG tablet TAKE 1 TABLET BY MOUTH THREE TIMES DAILY AS NEEDED FOR DIZZINESS  02/08/18  Yes [name], MD  nitroGLYCERIN (NITROSTAT) 0.4 MG SL tablet Place 1 tablet (0.4 mg total) under the tongue every 5 (five) minutes as needed. For chest pain 08/04/17  Yes [name], MD  pravastatin (PRAVACHOL) 80 MG tablet TAKE 1 TABLET BY MOUTH ONCE DAILY Patient taking differently: TAKE 80 mg  TABLET BY MOUTH ONCE DAILY 11/24/17  Yes [name], MD    Physical Exam: Vitals:   02/27/18 1400 02/27/18 1500 02/27/18 1531 02/27/18 1600  BP: (!) 182/102 (!) 144/111 (!) 182/105 (!) 197/83  Pulse: 90 81 76 88  Resp: (!) 29 (!) 25 (!) 27 (!) 28  Temp:      TempSrc:      SpO2: 100% 100% 100% 100%  Weight:      Height:        Constitutional: NAD, calm, comfortable Vitals:   02/27/18 1400 02/27/18 1500 02/27/18 1531 02/27/18 1600  BP: (!) 182/102 (!) 144/111 (!) 182/105 (!) 197/83  Pulse: 90 81 76 88  Resp: (!) 29 (!) 25 (!) 27 (!) 28  Temp:      TempSrc:      SpO2: 100% 100% 100% 100%  Weight:      Height:       Eyes: PERRL, lids and conjunctivae normal ENMT: Mucous membranes are moist. Posterior pharynx clear of any exudate or lesions.Normal dentition.  Neck: normal, supple, no masses, no thyromegaly Respiratory: clear to auscultation bilaterally, no wheezing, crackles at the bases. Normal respiratory effort. No accessory muscle use.  Cardiovascular: Regular rate and rhythm, no murmurs / rubs / gallops. No extremity edema. 2+ pedal pulses. No carotid bruits.  Abdomen: no tenderness, no masses palpated. No hepatosplenomegaly. Bowel sounds positive.  Musculoskeletal: no clubbing / cyanosis. No joint deformity upper and lower extremities. Good ROM, no contractures. Normal muscle tone.  Skin: no rashes, lesions, ulcers. No induration Neurologic: CN 2-12 intact. Sensation intact. Strength 5/5 in all 4 (except 4/5 in LUE from previous injury)  Psychiatric: Normal judgment and insight. Alert and oriented x 2. Normal mood.   Labs on Admission: I have personally reviewed  following labs and imaging studies  CBC: Recent Labs  Lab 02/27/18 1133 02/27/18 1159  WBC  --  7.7  NEUTROABS  --  6.8  HGB 10.9* 10.1*  HCT 32.0* 30.7*  MCV  --  95.0  PLT  --  237*   Basic Metabolic Panel: Recent Labs  Lab 02/27/18 1126 02/27/18 1133  NA 143 141  K 5.6* 5.3*  CL 111 110  CO2 18*  --   GLUCOSE 311* 312*  BUN 36* 38*  CREATININE 2.06* 2.00*  CALCIUM 9.2  --    GFR: Estimated Creatinine Clearance: 28 mL/min (Deletha Jaffee) (by C-G formula based on SCr of 2 mg/dL (H)). Liver Function  Tests: Recent Labs  Lab 02/27/18 1126  AST 37  ALT 20  ALKPHOS 111  BILITOT 1.0  PROT 7.0  ALBUMIN 3.6   No results for input(s): LIPASE, AMYLASE in the last 168 hours. No results for input(s): AMMONIA in the last 168 hours. Coagulation Profile: No results for input(s): INR, PROTIME in the last 168 hours. Cardiac Enzymes: Recent Labs  Lab 02/27/18 1126 02/27/18 1506  TROPONINI 0.03* 0.06*   BNP (last 3 results) No results for input(s): PROBNP in the last 8760 hours. HbA1C: No results for input(s): HGBA1C in the last 72 hours. CBG: Recent Labs  Lab 02/27/18 1141  GLUCAP 290*   Lipid Profile: No results for input(s): CHOL, HDL, LDLCALC, TRIG, CHOLHDL, LDLDIRECT in the last 72 hours. Thyroid Function Tests: No results for input(s): TSH, T4TOTAL, FREET4, T3FREE, THYROIDAB in the last 72 hours. Anemia Panel: No results for input(s): VITAMINB12, FOLATE, FERRITIN, TIBC, IRON, RETICCTPCT in the last 72 hours. Urine analysis:    Component Value Date/Time   COLORURINE YELLOW 02/27/2018 1226   APPEARANCEUR CLEAR 02/27/2018 1226   LABSPEC 1.010 02/27/2018 1226   PHURINE 7.0 02/27/2018 1226   GLUCOSEU >=500 (Chad Mcmillan) 02/27/2018 1226   GLUCOSEU NEGATIVE 09/07/2014 1334   HGBUR SMALL (Chad Mcmillan) 02/27/2018 1226   BILIRUBINUR NEGATIVE 02/27/2018 1226   KETONESUR 5 (Chad Mcmillan) 02/27/2018 1226   PROTEINUR >=300 (Chad Mcmillan) 02/27/2018 1226   UROBILINOGEN 0.2 09/07/2014 1334   NITRITE NEGATIVE  02/27/2018 1226   LEUKOCYTESUR NEGATIVE 02/27/2018 1226    Radiological Exams on Admission: Dg Chest 2 View  Result Date: 02/27/2018 CLINICAL DATA:  Nausea, vomiting and diarrhea since 4 Maxum Cassarino.m. today. Increased weakness. History of hypertension and diabetes. EXAM: CHEST - 2 VIEW COMPARISON:  Chest x-rays dated 01/12/2018, 12/30/2017 and 04/23/2016. FINDINGS: Heart size and mediastinal contours are stable. Aortic atherosclerosis. New nodular opacity within the RIGHT mid lung region. Persistent ill-defined opacity at the RIGHT lung base is not significantly changed. No pneumothorax seen. No acute or suspicious osseous finding. Mild degenerative spurring and/or ankylosis within the mid and lower thoracic spine. IMPRESSION: 1. New nodular opacity within the RIGHT mid lung region. Neoplastic pulmonary nodule cannot be excluded. Recommend chest CT for further characterization. 2. Persistent ill-defined opacity at the RIGHT lung base, not significantly changed compared to previous studies dating back to 2017 suggesting chronic atelectasis. 3. Rounded opacity at the medial aspects of the LEFT lung base, best seen on the AP view, corresponding to the basilar opacity/consolidation described on chest CT of 01/12/2018 as Chad Mcmillan possible pneumonia. Neoplastic mass is also Chad Mcmillan possibility. Again, recommend chest CT for further characterization. Electronically Signed   By: Chad Mcmillan M.D.   On: 02/27/2018 11:02   Ct Head Wo Contrast  Result Date: 02/27/2018 CLINICAL DATA:  Generalized weakness EXAM: CT HEAD WITHOUT CONTRAST TECHNIQUE: Contiguous axial images were obtained from the base of the skull through the vertex without intravenous contrast. COMPARISON:  01/12/2018 FINDINGS: Brain: There is atrophy and chronic small vessel disease changes. No acute intracranial abnormality. Specifically, no hemorrhage, hydrocephalus, mass lesion, acute infarction, or significant intracranial injury. Vascular: No hyperdense vessel or  unexpected calcification. Skull: No acute calvarial abnormality. Sinuses/Orbits: No acute finding Other: None IMPRESSION: No acute intracranial abnormality. Atrophy, chronic microvascular disease. Electronically Signed   By: Rolm Baptise M.D.   On: 02/27/2018 11:04   Ct Chest Wo Contrast  Result Date: 02/27/2018 CLINICAL DATA:  Lung nodule, weakness EXAM: CT CHEST WITHOUT CONTRAST TECHNIQUE: Multidetector CT imaging of the chest was  performed following the standard protocol without IV contrast. Sagittal and coronal MPR images reconstructed from axial data set. COMPARISON:  Chest radiograph 02/27/2018 FINDINGS: Cardiovascular: Atherosclerotic calcifications aorta, coronary arteries and proximal great vessels. Upper normal caliber ascending thoracic aorta. No pericardial effusion. Mediastinum/Nodes: Esophagus normal appearance. Base of cervical region normal appearance. No thoracic adenopathy Lungs/Pleura: Small LEFT pleural effusion. Patchy somewhat nodular appearing infiltrates are identified in BILATERAL upper and lower lobes likely inflammatory/infectious. More confluent areas of consolidation in RIGHT middle lobe and LEFT lower lobe. Central peribronchial thickening. No pneumothorax. Unable to exclude small underlying pulmonary nodules. Upper Abdomen: Gallbladder surgically absent. Visualized upper abdomen otherwise unremarkable. Musculoskeletal: Demineralized with scattered degenerative disc disease changes of the cervical and thoracic spine. IMPRESSION: Peribronchial thickening with patchy BILATERAL pulmonary infiltrates greater on RIGHT, with more dense areas of consolidation in the RIGHT middle lobe and LEFT lower lobe. Findings are most consistent with an infectious/inflammatory process favor pneumonia. Short-term follow-up CT chest imaging recommended in 2-3 months following treatment to ensure resolution and exclude underlying pulmonary abnormalities including tumor. Scattered atherosclerotic  calcifications including coronary arteries. Aortic Atherosclerosis (ICD10-I70.0). Electronically Signed   By: Lavonia Dana M.D.   On: 02/27/2018 13:32    EKG: Independently reviewed. P waves difficult to see, LBBB, prolonged QTc.  Appears similar to priors.  Assessment/Plan Active Problems:   Pneumonia  HCAP:  Abnormal CXR with CT notable for R middle lobe and L lower lobe pulmonary infiltrates c/f pneumonia. Will need f/u CT withn 2-3 months Vanc/cefepime Follow blood cultures, sputum cx, urine strep MRSA PCR IVF SLP eval  Fall  Possible LOC or Syncope  Generalized Weakness:  Story is Yisell Sprunger bit unclear regarding the LOC.  When discussing, son notes he may have lost consciousness one of the times when he fell, but this isn't readily apparent in discussing with patient.  He denies any trauma or any pain at this time.  Suspect the generalized weakness is due to pneumonia above (son notes balance problems and more difficulty understanding speech over past few weeks). Negative head CT Orthostatics, telemetry Continue abx PT  Mildly elevated troponin: will trend.  No chest pain.  EKG appears similar to priors.  Diabetes  Hyperglycemia: Noted to be type 1 in the chart.  will decrease mealtime insulin to 5 units TID while admitted.  Continue 5 units NPH QHS.  SSI.  Follow A1c.  Abnormal UA: cx ordered, but no sx.  Not suggestive of UTI.  With proteinuria as well.  Hyperkalemia: mild, follow  Abnormal EKG: with LBBB and p waves difficult to see, ctm on tele, previous EKG's have been read as afib.  Continue to monitor on telemetry.  Prolonged QTc: follow mag, repeat AM EKG.  Limit qt prolonging meds.  Hold donepezil.  Dementia: Kelsie Zaborowski&Ox~2 today.  Delirium precautions.  Hold donepezil due to above.  Home ativan prn.  Hypertension: elevated in ED, continue home meds (amlodipine)  CKD stage III-IV:  Baseline creatinine around 1.9, currently 2.    NAGMA: mild, follow    Thrombocytopenia:  chronic, follow   Anemia: chronic, follow  Hx CVA  Hx CAD: continue ASA, statin  DVT prophylaxis: lovenox Code Status: full code, of note he wanted to be full code today, but has been DNR in past.  Would readdress this in future.  Encouraged to continue to discuss.  Family Communication: son at bedside  Disposition Plan: pending improvement  Consults called: none  Admission status: inpatient   Fayrene Helper MD [facility] Pager  [phone]  If 7PM-7AM, please contact night-coverage [email or web address] Password [facility]  02/27/2018, 4:45 PM

## 2018-02-27 NOTE — ED Notes (Signed)
Bed: PN37 Expected date:  Expected time:  Means of arrival:  Comments: 84 yo weakness in legs

## 2018-02-27 NOTE — ED Triage Notes (Signed)
N/V/D since 0400. Generalized weakness. 180/80 CBG 349  80 98% RA.

## 2018-02-27 NOTE — ED Notes (Signed)
Date and time results received: 02/27/18 1259 (use smartphrase ".now" to insert current time)  Test: Trop Critical Value: 0.03  Name of Provider Notified: Meagan B/Dr Regenia Skeeter  Orders Received? Or Actions Taken?: Actions Taken: Engineer, structural and EDP

## 2018-02-27 NOTE — Progress Notes (Signed)
Pharmacy Antibiotic Note  Chad Mcmillan is a 84 y.o. male admitted on 02/27/2018 with pneumonia.  Pharmacy has been consulted for Vancomycin & Cefepime dosing.  Plan: Vancomycin 1500mg  x1, then 1500mg  IV every 48 hours.  Goal trough 15-20 mcg/mL.  Cefepime 2gm x1, then 1gm q24  Height: 6\' 2"  (188 cm) Weight: 190 lb (86.2 kg) IBW/kg (Calculated) : 82.2  Temp (24hrs), Avg:97.1 F (36.2 C), Min:96.8 F (36 C), Max:97.3 F (36.3 C)  Recent Labs  Lab 02/27/18 1126 02/27/18 1133 02/27/18 1159  WBC  --   --  7.7  CREATININE 2.06* 2.00*  --     Estimated Creatinine Clearance: 28 mL/min (A) (by C-G formula based on SCr of 2 mg/dL (H)).    No Known Allergies  Antimicrobials this admission: 4/12 Cefepime >>  4/12 Vancomycin  >>   Dose adjustments this admission:  Microbiology results: 4/12 BCx: sent 4/12 UCx: sent          Sputum: ordered          MRSA PCR: ordered        Strep pneumo: ordered  Thank you for allowing pharmacy to be a part of this patient's care.  Minda Ditto PharmD Pager [phone] 02/27/2018, 3:09 PM

## 2018-02-27 NOTE — Progress Notes (Signed)
A consult was received from an ED physician for Vancomycin per pharmacy dosing.  The patient's profile has been reviewed for ht/wt/allergies/indication/available labs.   A one time order has been placed for Vancomycin 1500mg .  Further antibiotics/pharmacy consults should be ordered by admitting physician if indicated.                       Thank you, Biagio Borg 02/27/2018  1:45 PM

## 2018-02-27 NOTE — ED Provider Notes (Signed)
[facility] DEPT Provider Note   CSN: [id number] Arrival date & time: 02/27/18  3785     History   Chief Complaint Chief Complaint  Patient presents with  . Weakness    HPI AMDREW OBOYLE is a 84 y.o. male.  HPI  84 year old male with a history of coronary artery disease, diabetes, hypertension and dementia presents with weakness.  The patient lives at home with his wife and history is obtained from both patient and EMS.  Around 4 AM the patient woke up and could not get out of bed.  He felt like he was too weak and dizzy.  He had 2 episodes of vomiting.  He tried to get up to clean himself off but felt dizzy when sitting in his legs felt like jelly. His vision was transiently blurry as well.  He thought his glucose might be low so he ate a couple candies that were at his bedside. He states since then he try to get up multiple times but was unable to and urinated on himself.  He states he has had increased urination recently.  No further vomiting.  He denies any headache, chest pain, abdominal pain, back pain or any pain at all.  He states his arms feel fine but he has weakness in the lower extremities.  He felt hot and cold chills this morning.  He was asked if his speech was more slurred than normal and is not sure it is.  He has had a prior stroke. He denies cough but has been feeling short of breath.  Past Medical History:  Diagnosis Date  . ADENOCARCINOMA, PROSTATE 06/01/2008  . CORONARY ARTERY DISEASE 06/13/2007  . CVA 06/01/2008   x 2  . DIABETES MELLITUS, TYPE I, WITH OPHTHALMIC COMPLICATIONS 8/85/0277  . 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 HYPERKALEMIA 06/01/2008   due to DM neparopathy  . HYPERLIPIDEMIA 06/13/2007  . HYPERTENSION 06/13/2007  . NEOPLASM, MALIGNANT, SKIN, FACE 06/01/2008  . Other chronic nonalcoholic liver disease 02/27/8785  . RENAL DISEASE 06/01/2008    Patient Active Problem List   Diagnosis Date Noted  . Pneumonia 02/27/2018  .  Hypocalcemia 02/12/2018  . Agitation   . Dementia with behavioral disturbance   . Palliative care by specialist   . CKD (chronic kidney disease), stage III ([place]) 01/14/2018  . Toxic encephalopathy 01/12/2018  . HAP (hospital-acquired pneumonia) 01/12/2018  . AKI (acute kidney injury) ([place]) 01/12/2018  . Inguinal hernia of right side without obstruction or gangrene 01/12/2018  . Acute renal failure superimposed on stage 3 chronic kidney disease ([place]) 01/07/2018  . Vertigo 01/07/2018  . LBBB (left bundle branch block) 01/07/2018  . Generalized weakness 01/07/2018  . Acute metabolic encephalopathy 76/72/0947  . Dehydration   . Foot pain, bilateral 10/07/2017  . Memory loss 04/07/2017  . Numbness 03/06/2016  . Dizzy 01/31/2016  . Medication side effect 03/08/2015  . Pancytopenia ([place]) 07/27/2012  . Goals of care, counseling/discussion 07/27/2012  . DM (diabetes mellitus) ([place]) 07/27/2012  . Anemia 07/27/2012  . Encounter for long-term (current) use of other medications 07/23/2011  . NASH (nonalcoholic steatohepatitis) 07/17/2010  . COUGH 02/06/2010  . PNEUMONIA, ORGANISM UNSPECIFIED 11/27/2009  . NEOPLASM, MALIGNANT, SKIN, FACE 06/01/2008  . ADENOCARCINOMA, PROSTATE 06/01/2008  . HYPERKALEMIA 06/01/2008  . Stroke (cerebrum) ([place]) 06/01/2008  . Disorder of kidney and ureter 06/01/2008  . NEOPLASM, MALIGNANT, SKIN, FACE 06/01/2008  . Dyslipidemia 06/13/2007  . Essential hypertension 06/13/2007  . Coronary atherosclerosis 06/13/2007  Past Surgical History:  Procedure Laterality Date  . Carotid Duplex  09/12/2004  . CATARACT EXTRACTION    . CHOLECYSTECTOMY    . ESOPHAGOGASTRODUODENOSCOPY  06/28/1991  . HERNIA REPAIR     right Inguinal hernia  . Rest/Stress Cardiolite  01/22/2001        Home Medications    Prior to Admission medications   Medication Sig Start Date End Date Taking? Authorizing Provider  amLODipine (NORVASC) 5 MG tablet Take 1 tablet (5 mg total) by  mouth daily. 02/12/18  Yes [name], MD  aspirin EC 81 MG tablet Take 1 tablet (81 mg total) by mouth daily. 08/04/17  Yes [name], MD  donepezil (ARICEPT) 5 MG tablet Take 1 tablet (5 mg total) by mouth at bedtime. 02/12/18  Yes [name], MD  ergocalciferol (VITAMIN D2) 50000 units capsule Take 1 capsule (50,000 Units total) by mouth 3 (three) times a week. 02/13/18  Yes [name], MD  insulin lispro (HUMALOG) 100 UNIT/ML injection 3 times a day (just before each meal), 15-10-10 units 02/12/18  Yes [name], MD  insulin NPH Human (HUMULIN N) 100 UNIT/ML injection Inject 0.05 mLs (5 Units total) into the skin at bedtime. 02/12/18  Yes [name], MD  LORazepam (ATIVAN) 0.5 MG tablet Take 1 tablet (0.5 mg total) by mouth 2 (two) times daily as needed for anxiety. 02/12/18  Yes [name], MD  meclizine (ANTIVERT) 12.5 MG tablet TAKE 1 TABLET BY MOUTH THREE TIMES DAILY AS NEEDED FOR DIZZINESS 02/08/18  Yes [name], MD  nitroGLYCERIN (NITROSTAT) 0.4 MG SL tablet Place 1 tablet (0.4 mg total) under the tongue every 5 (five) minutes as needed. For chest pain 08/04/17  Yes [name], MD  pravastatin (PRAVACHOL) 80 MG tablet TAKE 1 TABLET BY MOUTH ONCE DAILY Patient taking differently: TAKE 80 mg  TABLET BY MOUTH ONCE DAILY 11/24/17  Yes [name], MD    Family History No family history on file.  Social History Social History   Tobacco Use  . Smoking status: Former Smoker    Last attempt to quit: 11/18/1978    Years since quitting: 39.3  . Smokeless tobacco: Never Used  Substance Use Topics  . Alcohol use: No  . Drug use: No     Allergies   Patient has no known allergies.   Review of Systems Review of Systems  Constitutional: Positive for chills.  Eyes: Positive for visual disturbance.  Respiratory: Positive for shortness of breath. Negative for cough.   Cardiovascular: Negative for chest pain.  Gastrointestinal: Positive for vomiting. Negative for  abdominal pain.  Genitourinary: Positive for frequency. Negative for dysuria.  Musculoskeletal: Negative for back pain.  Neurological: Positive for dizziness and weakness. Negative for headaches.  All other systems reviewed and are negative.    Physical Exam Updated Vital Signs BP (!) 144/111   Pulse 81   Temp (!) 96.8 F (36 C) (Rectal) Comment: warm blankets applied  Resp (!) 25   Ht 6\' 2"  (1.88 m)   Wt 86.2 kg (190 lb)   SpO2 100%   BMI 24.39 kg/m   Physical Exam  Constitutional: He is oriented to person, place, and time. He appears well-developed and well-nourished.  HENT:  Head: Normocephalic and atraumatic.  Right Ear: External ear normal.  Left Ear: External ear normal.  Nose: Nose normal.  Mouth/Throat: Mucous membranes are dry.  Eyes: Pupils are equal, round, and reactive to light. EOM are normal. Right eye exhibits no discharge.  Left eye exhibits no discharge.  Neck: Neck supple.  Cardiovascular: Normal rate, regular rhythm and normal heart sounds.  Pulmonary/Chest: Effort normal and breath sounds normal. He has no wheezes.  Abdominal: Soft. He exhibits no distension. There is no tenderness.  Musculoskeletal: He exhibits no edema.  Neurological: He is alert and oriented to person, place, and time.  CN 3-12 grossly intact. 5/5 strength in all 4 extremities. A little weaker in the LUE but this is limited by his chronic left upper arm deformity. Grossly normal sensation. Normal finger to nose.   Skin: Skin is warm and dry. He is not diaphoretic.  Nursing note and vitals reviewed.    ED Treatments / Results  Labs (all labs ordered are listed, but only abnormal results are displayed) Labs Reviewed  COMPREHENSIVE METABOLIC PANEL - Abnormal; Notable for the following components:      Result Value   Potassium 5.6 (*)    CO2 18 (*)    Glucose, Bld 311 (*)    BUN 36 (*)    Creatinine, Ser 2.06 (*)    GFR calc non Af Amer 27 (*)    GFR calc Af Amer 31 (*)    All  other components within normal limits  TROPONIN I - Abnormal; Notable for the following components:   Troponin I 0.03 (*)    All other components within normal limits  URINALYSIS, ROUTINE W REFLEX MICROSCOPIC - Abnormal; Notable for the following components:   Glucose, UA >=500 (*)    Hgb urine dipstick SMALL (*)    Ketones, ur 5 (*)    Protein, ur >=300 (*)    Squamous Epithelial / LPF 0-5 (*)    All other components within normal limits  CBC WITH DIFFERENTIAL/PLATELET - Abnormal; Notable for the following components:   RBC 3.23 (*)    Hemoglobin 10.1 (*)    HCT 30.7 (*)    Platelets 101 (*)    Lymphs Abs 0.6 (*)    All other components within normal limits  CBG MONITORING, ED - Abnormal; Notable for the following components:   Glucose-Capillary 290 (*)    All other components within normal limits  I-STAT CHEM 8, ED - Abnormal; Notable for the following components:   Potassium 5.3 (*)    BUN 38 (*)    Creatinine, Ser 2.00 (*)    Glucose, Bld 312 (*)    Calcium, Ion 1.06 (*)    Hemoglobin 10.9 (*)    HCT 32.0 (*)    All other components within normal limits  URINE CULTURE  CULTURE, BLOOD (ROUTINE X 2)  CULTURE, BLOOD (ROUTINE X 2)  CULTURE, EXPECTORATED SPUTUM-ASSESSMENT  GRAM STAIN  MRSA PCR SCREENING  CBC WITH DIFFERENTIAL/PLATELET  HIV ANTIBODY (ROUTINE TESTING)  STREP PNEUMONIAE URINARY ANTIGEN  TROPONIN I  TROPONIN I  TROPONIN I  I-STAT TROPONIN, ED    EKG EKG Interpretation  Date/Time:  Friday February 27 2018 10:22:13 EDT Ventricular Rate:  98 PR Interval:    QRS Duration: 157 QT Interval:  439 QTC Calculation: 561 R Axis:   3 Text Interpretation:  Sinus or ectopic atrial rhythm Borderline short PR interval IVCD, consider atypical RBBB Left ventricular hypertrophy Anterior Q waves, possibly due to LVH Abnormal T, consider ischemia, lateral leads Confirmed by Sherwood Gambler [phone]) on 02/27/2018 10:38:59 AM   Radiology Dg Chest 2 View  Result Date:  02/27/2018 CLINICAL DATA:  Nausea, vomiting and diarrhea since 4 a.m. today. Increased weakness. History of hypertension and diabetes.  EXAM: CHEST - 2 VIEW COMPARISON:  Chest x-rays dated 01/12/2018, 12/30/2017 and 04/23/2016. FINDINGS: Heart size and mediastinal contours are stable. Aortic atherosclerosis. New nodular opacity within the RIGHT mid lung region. Persistent ill-defined opacity at the RIGHT lung base is not significantly changed. No pneumothorax seen. No acute or suspicious osseous finding. Mild degenerative spurring and/or ankylosis within the mid and lower thoracic spine. IMPRESSION: 1. New nodular opacity within the RIGHT mid lung region. Neoplastic pulmonary nodule cannot be excluded. Recommend chest CT for further characterization. 2. Persistent ill-defined opacity at the RIGHT lung base, not significantly changed compared to previous studies dating back to 2017 suggesting chronic atelectasis. 3. Rounded opacity at the medial aspects of the LEFT lung base, best seen on the AP view, corresponding to the basilar opacity/consolidation described on chest CT of 01/12/2018 as a possible pneumonia. Neoplastic mass is also a possibility. Again, recommend chest CT for further characterization. Electronically Signed   By: Franki Cabot M.D.   On: 02/27/2018 11:02   Ct Head Wo Contrast  Result Date: 02/27/2018 CLINICAL DATA:  Generalized weakness EXAM: CT HEAD WITHOUT CONTRAST TECHNIQUE: Contiguous axial images were obtained from the base of the skull through the vertex without intravenous contrast. COMPARISON:  01/12/2018 FINDINGS: Brain: There is atrophy and chronic small vessel disease changes. No acute intracranial abnormality. Specifically, no hemorrhage, hydrocephalus, mass lesion, acute infarction, or significant intracranial injury. Vascular: No hyperdense vessel or unexpected calcification. Skull: No acute calvarial abnormality. Sinuses/Orbits: No acute finding Other: None IMPRESSION: No acute  intracranial abnormality. Atrophy, chronic microvascular disease. Electronically Signed   By: Rolm Baptise M.D.   On: 02/27/2018 11:04   Ct Chest Wo Contrast  Result Date: 02/27/2018 CLINICAL DATA:  Lung nodule, weakness EXAM: CT CHEST WITHOUT CONTRAST TECHNIQUE: Multidetector CT imaging of the chest was performed following the standard protocol without IV contrast. Sagittal and coronal MPR images reconstructed from axial data set. COMPARISON:  Chest radiograph 02/27/2018 FINDINGS: Cardiovascular: Atherosclerotic calcifications aorta, coronary arteries and proximal great vessels. Upper normal caliber ascending thoracic aorta. No pericardial effusion. Mediastinum/Nodes: Esophagus normal appearance. Base of cervical region normal appearance. No thoracic adenopathy Lungs/Pleura: Small LEFT pleural effusion. Patchy somewhat nodular appearing infiltrates are identified in BILATERAL upper and lower lobes likely inflammatory/infectious. More confluent areas of consolidation in RIGHT middle lobe and LEFT lower lobe. Central peribronchial thickening. No pneumothorax. Unable to exclude small underlying pulmonary nodules. Upper Abdomen: Gallbladder surgically absent. Visualized upper abdomen otherwise unremarkable. Musculoskeletal: Demineralized with scattered degenerative disc disease changes of the cervical and thoracic spine. IMPRESSION: Peribronchial thickening with patchy BILATERAL pulmonary infiltrates greater on RIGHT, with more dense areas of consolidation in the RIGHT middle lobe and LEFT lower lobe. Findings are most consistent with an infectious/inflammatory process favor pneumonia. Short-term follow-up CT chest imaging recommended in 2-3 months following treatment to ensure resolution and exclude underlying pulmonary abnormalities including tumor. Scattered atherosclerotic calcifications including coronary arteries. Aortic Atherosclerosis (ICD10-I70.0). Electronically Signed   By: Lavonia Dana M.D.   On:  02/27/2018 13:32    Procedures Procedures (including critical care time)  Medications Ordered in ED Medications  vancomycin (VANCOCIN) 1,500 mg in sodium chloride 0.9 % 500 mL IVPB (1,500 mg Intravenous New Bag/Given 02/27/18 1511)  ceFEPIme (MAXIPIME) 2 g in sodium chloride 0.9 % 100 mL IVPB (has no administration in time range)  lactated ringers infusion ( Intravenous New Bag/Given 02/27/18 1512)  vancomycin (VANCOCIN) 1,500 mg in sodium chloride 0.9 % 500 mL IVPB (has no administration in time range)  ceFEPIme (MAXIPIME) 1 g in sodium chloride 0.9 % 100 mL IVPB (has no administration in time range)  sodium chloride 0.9 % bolus 1,000 mL (0 mLs Intravenous Stopped 02/27/18 1301)  aspirin chewable tablet 324 mg (324 mg Oral Given 02/27/18 1324)     Initial Impression / Assessment and Plan / ED Course  I have reviewed the triage vital signs and the nursing notes.  Pertinent labs & imaging results that were available during my care of the patient were reviewed by me and considered in my medical decision making (see chart for details).     Initially, no clear cause for the patient's generalized weakness.  However CT chest that was obtained for possible nodules shows bilateral pneumonia.  Given a short of breath this correlates with an acute worsening in symptoms.  He will be treated for hospital-acquired pneumonia.  He is not septic with normal blood pressure to high blood pressure and no acidosis.  I think he can be treated with antibiotics on the hospitalist service.  He is too weak to go home.  Final Clinical Impressions(s) / ED Diagnoses   Final diagnoses:  HCAP (healthcare-associated pneumonia)  Generalized weakness    ED Discharge Orders    None       Sherwood Gambler, MD 02/27/18 1537

## 2018-02-27 NOTE — ED Notes (Signed)
Pt transported to [place], CT

## 2018-02-27 NOTE — ED Notes (Signed)
Dr. Powell at bedside.  

## 2018-02-27 NOTE — ED Notes (Addendum)
Unsuccessful blood culture attempt Lt AC.

## 2018-02-28 ENCOUNTER — Other Ambulatory Visit: Payer: Self-pay

## 2018-02-28 ENCOUNTER — Encounter (HOSPITAL_COMMUNITY): Payer: Self-pay | Admitting: *Deleted

## 2018-02-28 ENCOUNTER — Inpatient Hospital Stay (HOSPITAL_COMMUNITY): Payer: Medicare Other

## 2018-02-28 DIAGNOSIS — I340 Nonrheumatic mitral (valve) insufficiency: Secondary | ICD-10-CM

## 2018-02-28 DIAGNOSIS — J189 Pneumonia, unspecified organism: Secondary | ICD-10-CM

## 2018-02-28 LAB — EXPECTORATED SPUTUM ASSESSMENT W GRAM STAIN, RFLX TO RESP C

## 2018-02-28 LAB — TROPONIN I: TROPONIN I: 0.32 ng/mL — AB (ref <0.03)

## 2018-02-28 LAB — COMPREHENSIVE METABOLIC PANEL
ALK PHOS: 90 U/L (ref 38–126)
ALT: 16 U/L — ABNORMAL LOW (ref 17–63)
ANION GAP: 11 (ref 5–15)
AST: 24 U/L (ref 15–41)
Albumin: 2.8 g/dL — ABNORMAL LOW (ref 3.5–5.0)
BILIRUBIN TOTAL: 0.8 mg/dL (ref 0.3–1.2)
BUN: 34 mg/dL — AB (ref 6–20)
CO2: 21 mmol/L — AB (ref 22–32)
Calcium: 8.3 mg/dL — ABNORMAL LOW (ref 8.9–10.3)
Chloride: 111 mmol/L (ref 101–111)
Creatinine, Ser: 2.02 mg/dL — ABNORMAL HIGH (ref 0.61–1.24)
GFR calc Af Amer: 31 mL/min — ABNORMAL LOW (ref >60)
GFR calc non Af Amer: 27 mL/min — ABNORMAL LOW (ref >60)
GLUCOSE: 144 mg/dL — AB (ref 65–99)
POTASSIUM: 4.7 mmol/L (ref 3.5–5.1)
SODIUM: 143 mmol/L (ref 135–145)
TOTAL PROTEIN: 5.7 g/dL — AB (ref 6.5–8.1)

## 2018-02-28 LAB — ECHOCARDIOGRAM COMPLETE
Height: 74 in
Weight: 2888.91 oz

## 2018-02-28 LAB — CBC
HEMATOCRIT: 27.9 % — AB (ref 39.0–52.0)
HEMOGLOBIN: 9.2 g/dL — AB (ref 13.0–17.0)
MCH: 31.8 pg (ref 26.0–34.0)
MCHC: 33 g/dL (ref 30.0–36.0)
MCV: 96.5 fL (ref 78.0–100.0)
Platelets: 90 10*3/uL — ABNORMAL LOW (ref 150–400)
RBC: 2.89 MIL/uL — ABNORMAL LOW (ref 4.22–5.81)
RDW: 14.1 % (ref 11.5–15.5)
WBC: 6.5 10*3/uL (ref 4.0–10.5)

## 2018-02-28 LAB — LIPID PANEL
Cholesterol: 106 mg/dL (ref 0–200)
HDL: 42 mg/dL (ref >40)
LDL Cholesterol: 46 mg/dL (ref 0–99)
Total CHOL/HDL Ratio: 2.5 RATIO
Triglycerides: 88 mg/dL (ref <150)
VLDL: 18 mg/dL (ref 0–40)

## 2018-02-28 LAB — HEPARIN LEVEL (UNFRACTIONATED): Heparin Unfractionated: 0.24 IU/mL — ABNORMAL LOW (ref 0.30–0.70)

## 2018-02-28 LAB — URINE CULTURE: Culture: NO GROWTH

## 2018-02-28 LAB — HEMOGLOBIN A1C
Hgb A1c MFr Bld: 6.6 % — ABNORMAL HIGH (ref 4.8–5.6)
Mean Plasma Glucose: 142.72 mg/dL

## 2018-02-28 LAB — GLUCOSE, CAPILLARY
GLUCOSE-CAPILLARY: 127 mg/dL — AB (ref 65–99)
GLUCOSE-CAPILLARY: 225 mg/dL — AB (ref 65–99)
GLUCOSE-CAPILLARY: 144 mg/dL — AB (ref 65–99)
Glucose-Capillary: 114 mg/dL — ABNORMAL HIGH (ref 65–99)

## 2018-02-28 LAB — MRSA PCR SCREENING: MRSA BY PCR: POSITIVE — AB

## 2018-02-28 LAB — PROCALCITONIN: Procalcitonin: 0.19 ng/mL

## 2018-02-28 LAB — MAGNESIUM: MAGNESIUM: 1.8 mg/dL (ref 1.7–2.4)

## 2018-02-28 LAB — STREP PNEUMONIAE URINARY ANTIGEN: STREP PNEUMO URINARY ANTIGEN: NEGATIVE

## 2018-02-28 LAB — HIV ANTIBODY (ROUTINE TESTING W REFLEX): HIV Screen 4th Generation wRfx: NONREACTIVE

## 2018-02-28 MED ORDER — HEPARIN BOLUS VIA INFUSION
1000.0000 [IU] | Freq: Once | INTRAVENOUS | Status: DC
  Filled 2018-02-28: qty 1000

## 2018-02-28 MED ORDER — HEPARIN (PORCINE) IN NACL 100-0.45 UNIT/ML-% IJ SOLN
1000.0000 [IU]/h | INTRAMUSCULAR | Status: DC
  Filled 2018-02-28: qty 250

## 2018-02-28 MED ORDER — SODIUM CHLORIDE 0.9 % IV SOLN
1.0000 g | INTRAVENOUS | Status: DC
  Filled 2018-02-28: qty 10

## 2018-02-28 MED ORDER — CARVEDILOL 6.25 MG PO TABS
6.2500 mg | ORAL_TABLET | Freq: Two times a day (BID) | ORAL | Status: DC
  Administered 2018-02-28 – 2018-03-02 (×4): 6.25 mg via ORAL
  Filled 2018-02-28 (×6): qty 1

## 2018-02-28 MED ORDER — HEPARIN (PORCINE) IN NACL 100-0.45 UNIT/ML-% IJ SOLN
1300.0000 [IU]/h | INTRAMUSCULAR | Status: DC
  Administered 2018-02-28: 1000 [IU]/h via INTRAVENOUS
  Filled 2018-02-28 (×3): qty 250

## 2018-02-28 MED ORDER — AZITHROMYCIN 250 MG PO TABS: 250.0000 mg | ORAL_TABLET | Freq: Every day | ORAL | Status: DC

## 2018-02-28 MED ORDER — LISINOPRIL 10 MG PO TABS
10.0000 mg | ORAL_TABLET | Freq: Every day | ORAL | Status: DC
  Administered 2018-02-28 – 2018-03-01 (×2): 10 mg via ORAL
  Filled 2018-02-28 (×2): qty 1

## 2018-02-28 MED ORDER — HEPARIN BOLUS VIA INFUSION
2000.0000 [IU] | Freq: Once | INTRAVENOUS | Status: DC
  Filled 2018-02-28: qty 2000

## 2018-02-28 MED ORDER — DOXYCYCLINE HYCLATE 100 MG PO TABS
100.0000 mg | ORAL_TABLET | Freq: Two times a day (BID) | ORAL | Status: DC
  Administered 2018-02-28 – 2018-03-02 (×5): 100 mg via ORAL
  Filled 2018-02-28 (×5): qty 1

## 2018-02-28 MED ORDER — ATORVASTATIN CALCIUM 40 MG PO TABS
80.0000 mg | ORAL_TABLET | Freq: Every day | ORAL | Status: DC
  Administered 2018-02-28 – 2018-03-01 (×2): 80 mg via ORAL
  Filled 2018-02-28 (×2): qty 2

## 2018-02-28 MED ORDER — AZITHROMYCIN 250 MG PO TABS: 500.0000 mg | ORAL_TABLET | Freq: Once | ORAL | Status: DC

## 2018-02-28 NOTE — Progress Notes (Signed)
ANTICOAGULATION CONSULT NOTE - Follow Up Consult  Pharmacy Consult for Heparin Indication: chest pain/ACS  No Known Allergies  Patient Measurements: Height: 6\' 2"  (188 cm) Weight: 180 lb 8.9 oz (81.9 kg) IBW/kg (Calculated) : 82.2 Heparin Dosing Weight:   Vital Signs: Temp: 97.8 F (36.6 C) (04/13 2118) Temp Source: Oral (04/13 2118) BP: 142/60 (04/13 2118) Pulse Rate: 68 (04/13 2118)  Labs: Recent Labs    02/27/18 1126  02/27/18 1133 02/27/18 1159 02/27/18 1506 02/27/18 2032 02/28/18 0238 02/28/18 2317  HGB  --    < > 10.9* 10.1*  --   --  9.2*  --   HCT  --   --  32.0* 30.7*  --   --  27.9*  --   PLT  --   --   --  101*  --   --  90*  --   HEPARINUNFRC  --   --   --   --   --   --   --  0.24*  CREATININE 2.06*  --  2.00*  --   --   --  2.02*  --   TROPONINI 0.03*  --   --   --  0.06* 0.20* 0.32*  --    < > = values in this interval not displayed.    Estimated Creatinine Clearance: 27.6 mL/min (A) (by C-G formula based on SCr of 2.02 mg/dL (H)).   Medications:  Infusions:  . heparin 1,000 Units/hr (02/28/18 1514)  . lactated ringers    . lactated ringers 100 mL/hr at 02/28/18 0546    Assessment: Patient with low heparin level .  No heparin issues per RN.  Goal of Therapy:  Heparin level 0.3-0.7 units/ml Monitor platelets by anticoagulation protocol: Yes   Plan:  Increase heparin to 1100 units/hr Recheck level at 0900  Tyler Deis, Shea Stakes Crowford 02/28/2018,11:56 PM

## 2018-02-28 NOTE — Progress Notes (Signed)
ANTICOAGULATION CONSULT NOTE - Initial Consult  Pharmacy Consult for Heparin Indication: chest pain/ACS  No Known Allergies  Patient Measurements: Height: 6\' 2"  (188 cm) Weight: 180 lb 8.9 oz (81.9 kg) IBW/kg (Calculated) : 82.2  Vital Signs: Temp: 97.6 F (36.4 C) (04/13 0812) Temp Source: Oral (04/13 0812) BP: 185/72 (04/13 0812) Pulse Rate: 80 (04/13 0812)  Labs: Recent Labs    02/27/18 1126  02/27/18 1133 02/27/18 1159 02/27/18 1506 02/27/18 2032 02/28/18 0238  HGB  --    < > 10.9* 10.1*  --   --  9.2*  HCT  --   --  32.0* 30.7*  --   --  27.9*  PLT  --   --   --  101*  --   --  90*  CREATININE 2.06*  --  2.00*  --   --   --  2.02*  TROPONINI 0.03*  --   --   --  0.06* 0.20* 0.32*   < > = values in this interval not displayed.    Estimated Creatinine Clearance: 27.6 mL/min (A) (by C-G formula based on SCr of 2.02 mg/dL (H)).   Medical History: Past Medical History:  Diagnosis Date  . ADENOCARCINOMA, PROSTATE 06/01/2008  . CORONARY ARTERY DISEASE 06/13/2007  . CVA 06/01/2008   x 2  . DIABETES MELLITUS, TYPE I, WITH OPHTHALMIC COMPLICATIONS 7/74/1423  . 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 HYPERKALEMIA 06/01/2008   due to DM neparopathy  . HYPERLIPIDEMIA 06/13/2007  . HYPERTENSION 06/13/2007  . NEOPLASM, MALIGNANT, SKIN, FACE 06/01/2008  . Other chronic nonalcoholic liver disease 9/53/2023  . RENAL DISEASE 06/01/2008    Medications:  Infusions:  . ceFEPime (MAXIPIME) IV    . lactated ringers    . lactated ringers 100 mL/hr at 02/28/18 0546  . [START ON 03/01/2018] vancomycin      Assessment: 24 yoM admitted yesterday with PNA.  PMH significant for CAD, Afib/stroke.  Troponin elevated & continues to increase.  No subjective complaints of chest pain.  No EKG changes on admission- prolonged Qtc.   CBC reviewed- baseline pltc ~100, Hg low on admit @ 10.9.  Slight decrease in pltc, Hg this morning (dilutional?).  Rec'd Lovenox 30mg  SQ at 2230 on 4/12.  Goal of Therapy:   Heparin level 0.3-0.7 units/ml Monitor platelets by anticoagulation protocol: Yes   Plan:  D/C Lovenox Heparin 2000 units IV x1 (1/2 bolus since pt received px Lovenox dose last PM) Heparin infusion at 1000 units/hr  Check 8h heparin level Daily heparin level & CBC while on heparin  Biagio Borg 02/28/2018,10:29 AM

## 2018-02-28 NOTE — Progress Notes (Signed)
  Echocardiogram 2D Echocardiogram has been performed.  Chad Mcmillan T Cohen Boettner 02/28/2018, 9:39 AM

## 2018-02-28 NOTE — Evaluation (Signed)
Clinical/Bedside Swallow Evaluation Patient Details  Name: Chad Mcmillan MRN: [id number] Date of Birth: 1924/10/03  Today's Date: 02/28/2018 Time: SLP Start Time (ACUTE ONLY): 1500 SLP Stop Time (ACUTE ONLY): 1510 SLP Time Calculation (min) (ACUTE ONLY): 10 min  Past Medical History:  Past Medical History:  Diagnosis Date  . ADENOCARCINOMA, PROSTATE 06/01/2008  . CORONARY ARTERY DISEASE 06/13/2007  . CVA 06/01/2008   x 2  . DIABETES MELLITUS, TYPE I, WITH OPHTHALMIC COMPLICATIONS 02/24/8118  . 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 HYPERKALEMIA 06/01/2008   due to DM neparopathy  . HYPERLIPIDEMIA 06/13/2007  . HYPERTENSION 06/13/2007  . NEOPLASM, MALIGNANT, SKIN, FACE 06/01/2008  . Other chronic nonalcoholic liver disease 1/47/8295  . RENAL DISEASE 06/01/2008   Past Surgical History:  Past Surgical History:  Procedure Laterality Date  . Carotid Duplex  09/12/2004  . CATARACT EXTRACTION    . CHOLECYSTECTOMY    . ESOPHAGOGASTRODUODENOSCOPY  06/28/1991  . HERNIA REPAIR     right Inguinal hernia  . Rest/Stress Cardiolite  01/22/2001   HPI:  Chad Mcmillan a [age] y.o.malewith medical history significant ofdiabetes, coronary disease, stroke, hypertension, hyperlipidemia presenting with a recent fall and weakness with CT findings concerning for recurrent pneumonia. Pts son reprots that in the night prior to admission the patient needed to use the bathroom, but when he tried to get up he fell. He thought that his sugar dropped and he asked his wife to bring him some sweet and low. He apparently ate an entire box of sweet low due to this concern. He vomited approximately twice after this. No blood. After falling it took him a while to pull himself up and he ended up falling again. He was unable to get back up after that second time that he fell, unclear how long he was on the ground for if there was ever a loss of consciousness. Was released to SNF after prior pna admission, was stronger, but son reprots  progressive decling in speech and mobilty at home. CXR shows Peribronchial thickening with patchy BILATERAL pulmonary infiltrates greater on RIGHT, with more dense areas of consolidation in the RIGHT middle lobe and LEFT lower lobe. Pt seen by SLP on 01/14/18, normal subjective swallow function, but hoarse.    Assessment / Plan / Recommendation Clinical Impression  Pt demonstrates normal swallow function. Vocal quality consistent with presbyphonia. Pt frustrated with assessment, wants less agressive care and fewer diagnostic assessments. Verbalizes a desire to go home and be comfortable and would like to discuss code status with MD.       Aspiration Risk  Mild aspiration risk    Diet Recommendation Regular;Thin liquid   Liquid Administration via: Cup;Straw Medication Administration: Whole meds with liquid Supervision: Patient able to self feed    Other  Recommendations     Follow up Recommendations        Frequency and Duration            Prognosis        Swallow Study   General HPI: Chad Mcmillan a [age] y.o.malewith medical history significant ofdiabetes, coronary disease, stroke, hypertension, hyperlipidemia presenting with a recent fall and weakness with CT findings concerning for recurrent pneumonia. Pts son reprots that in the night prior to admission the patient needed to use the bathroom, but when he tried to get up he fell. He thought that his sugar dropped and he asked his wife to bring him some sweet and low. He apparently ate an entire box of  sweet low due to this concern. He vomited approximately twice after this. No blood. After falling it took him a while to pull himself up and he ended up falling again. He was unable to get back up after that second time that he fell, unclear how long he was on the ground for if there was ever a loss of consciousness. Was released to SNF after prior pna admission, was stronger, but son reprots progressive decling in speech and  mobilty at home. CXR shows Peribronchial thickening with patchy BILATERAL pulmonary infiltrates greater on RIGHT, with more dense areas of consolidation in the RIGHT middle lobe and LEFT lower lobe. Pt seen by SLP on 01/14/18, normal subjective swallow function, but hoarse.  Type of Study: Bedside Swallow Evaluation Previous Swallow Assessment: see HPI Diet Prior to this Study: Regular;Thin liquids Temperature Spikes Noted: No Respiratory Status: Room air History of Recent Intubation: No Behavior/Cognition: Alert;Cooperative;Pleasant mood Oral Cavity Assessment: Within Functional Limits Oral Care Completed by SLP: No Oral Cavity - Dentition: Poor condition Self-Feeding Abilities: Able to feed self Patient Positioning: Upright in bed Baseline Vocal Quality: Hoarse Volitional Cough: Strong Volitional Swallow: Able to elicit    Oral/Motor/Sensory Function Overall Oral Motor/Sensory Function: Within functional limits   Ice Chips     Thin Liquid Thin Liquid: Within functional limits    Nectar Thick Nectar Thick Liquid: Not tested   Honey Thick Honey Thick Liquid: Not tested   Puree Puree: Within functional limits   Solid   GO   Solid: Within functional limits Presentation: Garrett Aitanna Haubner, MA CCC-SLP [phone]  Lynann [place] 02/28/2018,3:18 PM

## 2018-02-28 NOTE — Progress Notes (Addendum)
PROGRESS NOTE    Chad Mcmillan  GOT:[id number] DOB: 14-Jun-1924 DOA: 02/27/2018 PCP: Chad Shin, MD   Brief Narrative: 84 year old with past medical history relevant for coronary artery disease with no intervention, hypertension, type 2 diabetes on insulin, hyperlipidemia, stage III CKD, dementia prior CVA who was admitted with presyncope and fall and found to have likely NSTEMI and possibly pneumonia.    Assessment & Plan:   Active Problems:   HCAP (healthcare-associated pneumonia)   #) NSTEMI: She noted to have elevated troponin and possible ST segment depressions in V5 and V6 (though these appear to be in prior EKG as well) with echo showing anterior wall motion abnormalities. -Start heparin drip -Start carvedilol 6.25 mg twice daily -Start lisinopril 10 mg daily - Will change pravastatin to atorvastatin 80 mg daily -Continue aspirin 81 mg -A1c and lipid level ordered, will liberalize these goals due to his advanced age  #) Possible pneumonia: Noted on chest x-ray to have opacities with CT concerning for possible pneumonia.  Unclear if this is possible pulmonary edema versus an unrelated event.  Patient does not have any fevers or elevated white count.  He has no real respiratory symptoms. -Discontinue cefepime and vancomycin -Start ceftriaxone and azithromycin on 02/28/2018 -Pro-calcitonin ordered  #) History of coronary artery disease: Patient reports an MI at approximately age 33 with no intervention. -Continue statin, beta-blocker, ACE inhibitor, aspirin  #) Type 2 diabetes on insulin: -Continue aspart 5 units 3 times daily with meals -Continue NPH 5 units nightly -Sliding scale insulin, before meals at bedtime -Carb restricted diet  #) Anemia: Patient's anemia appears to be stable with a normal MCV. -Pending folate, B12 -Pending iron and TIBC, ferritin  #)  CVA x2: No apparent residual deficits -Continue aspirin 81 mg next line-continue statin  #) Stage III CKD:  stable today - start ACEi per above  #) Hypertension: -Continue morphine 5 mg -Start beta-blocker and ACE inhibitor per above  #) Dementia: -Continue donepezil 5 mg nightly  Fluids: Tolerating p.o. Elect lites: Monitor and supplement Nutrition: Carb restricted/heart healthy diet  Prophylaxis: Heparin drip  Disposition: Pending medical treatment of NST EMI and evaluation of possible pneumonia  Full code (we will discuss with son)   Consultants:   None  Procedures: (Don't include imaging studies which can be auto populated. Include things that cannot be auto populated i.e. Echo, Carotid and venous dopplers, Foley, Bipap, HD, tubes/drains, wound vac, central lines etc)  02/28/2018 echo: Pending  Antimicrobials: (specify start and planned stop date. Auto populated tables are space occupying and do not give end dates)  IV cefepime and vancomycin  02/27/2018 to 02/28/2018  IV ceftriaxone and azithromycin 02/28/2018 to ongoing   Subjective: She reports he is doing well.  He is quite eager to leave the hospital.  He otherwise denies any chest pain, nausea, vomiting, diarrhea.  He reports that he had some presyncope symptoms when he got up and that he felt poorly and had some episodes of nonbilious nonbloody emesis which brought him into the ED.  He denies any exertional chest pain.  Objective: Vitals:   02/27/18 2129 02/28/18 0542 02/28/18 0600 02/28/18 0812  BP: (!) 166/69 (!) 184/69  (!) 185/72  Pulse: 75 83  80  Resp: 17 17  18   Temp: 97.6 F (36.4 C) (!) 97 F (36.1 C)  97.6 F (36.4 C)  TempSrc: Oral Oral  Oral  SpO2: 98% 98%  98%  Weight:   81.9 kg (180 lb 8.9 oz)  Height:        Intake/Output Summary (Last 24 hours) at 02/28/2018 1131 Last data filed at 02/28/2018 0800 Gross per 24 hour  Intake 1420 ml  Output 550 ml  Net 870 ml   Filed Weights   02/27/18 1019 02/28/18 0600  Weight: 86.2 kg (190 lb) 81.9 kg (180 lb 8.9 oz)    Examination:  General exam:  Appears calm and comfortable  Respiratory system: Clear to auscultation. Respiratory effort normal. Cardiovascular system: Regular rate and rhythm, normal S1 and S2, no murmurs Gastrointestinal system: Soft, nontender, nondistended, plus bowel sounds Central nervous system: Alert and oriented.  Grossly moving all extremities Extremities: No lower extremity edema Skin: No rashes on visible skin Psychiatry: Judgement and insight appear limited l. Mood & affect appropriate.     Data Reviewed: I have personally reviewed following labs and imaging studies  CBC: Recent Labs  Lab 02/27/18 1133 02/27/18 1159 02/28/18 0238  WBC  --  7.7 6.5  NEUTROABS  --  6.8  --   HGB 10.9* 10.1* 9.2*  HCT 32.0* 30.7* 27.9*  MCV  --  95.0 96.5  PLT  --  101* 90*   Basic Metabolic Panel: Recent Labs  Lab 02/27/18 1126 02/27/18 1133 02/27/18 1506 02/28/18 0238  NA 143 141  --  143  K 5.6* 5.3*  --  4.7  CL 111 110  --  111  CO2 18*  --   --  21*  GLUCOSE 311* 312*  --  144*  BUN 36* 38*  --  34*  CREATININE 2.06* 2.00*  --  2.02*  CALCIUM 9.2  --   --  8.3*  MG  --   --  1.9 1.8   GFR: Estimated Creatinine Clearance: 27.6 mL/min (A) (by C-G formula based on SCr of 2.02 mg/dL (H)). Liver Function Tests: Recent Labs  Lab 02/27/18 1126 02/28/18 0238  AST 37 24  ALT 20 16*  ALKPHOS 111 90  BILITOT 1.0 0.8  PROT 7.0 5.7*  ALBUMIN 3.6 2.8*   No results for input(s): LIPASE, AMYLASE in the last 168 hours. No results for input(s): AMMONIA in the last 168 hours. Coagulation Profile: No results for input(s): INR, PROTIME in the last 168 hours. Cardiac Enzymes: Recent Labs  Lab 02/27/18 1126 02/27/18 1506 02/27/18 2032 02/28/18 0238  TROPONINI 0.03* 0.06* 0.20* 0.32*   BNP (last 3 results) No results for input(s): PROBNP in the last 8760 hours. HbA1C: Recent Labs    02/27/18 1159  HGBA1C 6.5*   CBG: Recent Labs  Lab 02/27/18 1141 02/27/18 1837 02/27/18 2136  02/28/18 0749  GLUCAP 290* 183* 112* 127*   Lipid Profile: No results for input(s): CHOL, HDL, LDLCALC, TRIG, CHOLHDL, LDLDIRECT in the last 72 hours. Thyroid Function Tests: No results for input(s): TSH, T4TOTAL, FREET4, T3FREE, THYROIDAB in the last 72 hours. Anemia Panel: No results for input(s): VITAMINB12, FOLATE, FERRITIN, TIBC, IRON, RETICCTPCT in the last 72 hours. Sepsis Labs: No results for input(s): PROCALCITON, LATICACIDVEN in the last 168 hours.  Recent Results (from the past 240 hour(s))  Urine culture     Status: None   Collection Time: 02/27/18 12:26 PM  Result Value Ref Range Status   Specimen Description   Final    URINE, CLEAN CATCH Performed at [facility], [place] [address] [place], [place] [address]    Special Requests   Final    NONE Performed at [facility], [place] [address] [place],  [place] [address]    Culture   Final    NO GROWTH Performed at [facility], [place] [address] [place], [place] [address]    Report Status 02/28/2018 FINAL  Final  Culture, sputum-assessment     Status: None   Collection Time: 02/27/18  2:48 PM  Result Value Ref Range Status   Specimen Description SPUTUM  Final   Special Requests NONE  Final   Sputum evaluation   Final    THIS SPECIMEN IS ACCEPTABLE FOR SPUTUM CULTURE Performed at [facility], [place] [address] [place], [place] [address]    Report Status 02/28/2018 FINAL  Final         Radiology Studies: Dg Chest 2 View  Result Date: 02/27/2018 CLINICAL DATA:  Nausea, vomiting and diarrhea since 4 a.m. today. Increased weakness. History of hypertension and diabetes. EXAM: CHEST - 2 VIEW COMPARISON:  Chest x-rays dated 01/12/2018, 12/30/2017 and 04/23/2016. FINDINGS: Heart size and mediastinal contours are stable. Aortic atherosclerosis. New nodular opacity within the RIGHT mid lung region. Persistent ill-defined opacity at the RIGHT lung base is not  significantly changed. No pneumothorax seen. No acute or suspicious osseous finding. Mild degenerative spurring and/or ankylosis within the mid and lower thoracic spine. IMPRESSION: 1. New nodular opacity within the RIGHT mid lung region. Neoplastic pulmonary nodule cannot be excluded. Recommend chest CT for further characterization. 2. Persistent ill-defined opacity at the RIGHT lung base, not significantly changed compared to previous studies dating back to 2017 suggesting chronic atelectasis. 3. Rounded opacity at the medial aspects of the LEFT lung base, best seen on the AP view, corresponding to the basilar opacity/consolidation described on chest CT of 01/12/2018 as a possible pneumonia. Neoplastic mass is also a possibility. Again, recommend chest CT for further characterization. Electronically Signed   By: Franki Cabot M.D.   On: 02/27/2018 11:02   Ct Head Wo Contrast  Result Date: 02/27/2018 CLINICAL DATA:  Generalized weakness EXAM: CT HEAD WITHOUT CONTRAST TECHNIQUE: Contiguous axial images were obtained from the base of the skull through the vertex without intravenous contrast. COMPARISON:  01/12/2018 FINDINGS: Brain: There is atrophy and chronic small vessel disease changes. No acute intracranial abnormality. Specifically, no hemorrhage, hydrocephalus, mass lesion, acute infarction, or significant intracranial injury. Vascular: No hyperdense vessel or unexpected calcification. Skull: No acute calvarial abnormality. Sinuses/Orbits: No acute finding Other: None IMPRESSION: No acute intracranial abnormality. Atrophy, chronic microvascular disease. Electronically Signed   By: Rolm Baptise M.D.   On: 02/27/2018 11:04   Ct Chest Wo Contrast  Result Date: 02/27/2018 CLINICAL DATA:  Lung nodule, weakness EXAM: CT CHEST WITHOUT CONTRAST TECHNIQUE: Multidetector CT imaging of the chest was performed following the standard protocol without IV contrast. Sagittal and coronal MPR images reconstructed from  axial data set. COMPARISON:  Chest radiograph 02/27/2018 FINDINGS: Cardiovascular: Atherosclerotic calcifications aorta, coronary arteries and proximal great vessels. Upper normal caliber ascending thoracic aorta. No pericardial effusion. Mediastinum/Nodes: Esophagus normal appearance. Base of cervical region normal appearance. No thoracic adenopathy Lungs/Pleura: Small LEFT pleural effusion. Patchy somewhat nodular appearing infiltrates are identified in BILATERAL upper and lower lobes likely inflammatory/infectious. More confluent areas of consolidation in RIGHT middle lobe and LEFT lower lobe. Central peribronchial thickening. No pneumothorax. Unable to exclude small underlying pulmonary nodules. Upper Abdomen: Gallbladder surgically absent. Visualized upper abdomen otherwise unremarkable. Musculoskeletal: Demineralized with scattered degenerative disc disease changes of the cervical and thoracic spine. IMPRESSION: Peribronchial thickening with patchy BILATERAL pulmonary infiltrates greater on RIGHT, with more dense areas  of consolidation in the RIGHT middle lobe and LEFT lower lobe. Findings are most consistent with an infectious/inflammatory process favor pneumonia. Short-term follow-up CT chest imaging recommended in 2-3 months following treatment to ensure resolution and exclude underlying pulmonary abnormalities including tumor. Scattered atherosclerotic calcifications including coronary arteries. Aortic Atherosclerosis (ICD10-I70.0). Electronically Signed   By: Lavonia Dana M.D.   On: 02/27/2018 13:32        Scheduled Meds: . amLODipine  5 mg Oral Daily  . aspirin EC  81 mg Oral Daily  . heparin  2,000 Units Intravenous Once  . insulin aspart  0-5 Units Subcutaneous QHS  . insulin aspart  0-9 Units Subcutaneous TID WC  . insulin aspart  5 Units Subcutaneous TID WC  . insulin NPH Human  5 Units Subcutaneous QHS  . pravastatin  80 mg Oral Daily  . Vitamin D (Ergocalciferol)  50,000 Units Oral  Once per day on Mon Wed Fri   Continuous Infusions: . ceFEPime (MAXIPIME) IV    . heparin    . lactated ringers    . lactated ringers 100 mL/hr at 02/28/18 0546  . [START ON 03/01/2018] vancomycin       LOS: 1 day    Time spent: [place], MD [facility]   If 7PM-7AM, please contact night-coverage [email or web address] Password [facility] 02/28/2018, 11:31 AM

## 2018-03-01 LAB — BASIC METABOLIC PANEL
Anion gap: 8 (ref 5–15)
BUN: 32 mg/dL — ABNORMAL HIGH (ref 6–20)
Chloride: 108 mmol/L (ref 101–111)
GFR calc non Af Amer: 27 mL/min — ABNORMAL LOW (ref >60)
Potassium: 4.4 mmol/L (ref 3.5–5.1)
Sodium: 139 mmol/L (ref 135–145)

## 2018-03-01 LAB — CBC
HCT: 27.1 % — ABNORMAL LOW (ref 39.0–52.0)
Hemoglobin: 8.9 g/dL — ABNORMAL LOW (ref 13.0–17.0)
MCH: 31.8 pg (ref 26.0–34.0)
MCHC: 32.8 g/dL (ref 30.0–36.0)
MCV: 96.8 fL (ref 78.0–100.0)
Platelets: 90 K/uL — ABNORMAL LOW (ref 150–400)
RBC: 2.8 MIL/uL — ABNORMAL LOW (ref 4.22–5.81)
RDW: 13.9 % (ref 11.5–15.5)
WBC: 5.5 K/uL (ref 4.0–10.5)

## 2018-03-01 LAB — BASIC METABOLIC PANEL WITH GFR
CO2: 23 mmol/L (ref 22–32)
Calcium: 7.9 mg/dL — ABNORMAL LOW (ref 8.9–10.3)
Creatinine, Ser: 2.02 mg/dL — ABNORMAL HIGH (ref 0.61–1.24)
GFR calc Af Amer: 31 mL/min — ABNORMAL LOW (ref >60)
Glucose, Bld: 101 mg/dL — ABNORMAL HIGH (ref 65–99)

## 2018-03-01 LAB — VITAMIN B12: Vitamin B-12: 170 pg/mL — ABNORMAL LOW (ref 180–914)

## 2018-03-01 LAB — HEPARIN LEVEL (UNFRACTIONATED)
Heparin Unfractionated: 0.23 [IU]/mL — ABNORMAL LOW (ref 0.30–0.70)
Heparin Unfractionated: 0.24 [IU]/mL — ABNORMAL LOW (ref 0.30–0.70)

## 2018-03-01 LAB — IRON AND TIBC
Iron: 28 ug/dL — ABNORMAL LOW (ref 45–182)
Saturation Ratios: 14 % — ABNORMAL LOW (ref 17.9–39.5)
TIBC: 203 ug/dL — ABNORMAL LOW (ref 250–450)
UIBC: 175 ug/dL

## 2018-03-01 LAB — MAGNESIUM: Magnesium: 1.6 mg/dL — ABNORMAL LOW (ref 1.7–2.4)

## 2018-03-01 LAB — PROCALCITONIN: Procalcitonin: 0.19 ng/mL

## 2018-03-01 LAB — FERRITIN: Ferritin: 98 ng/mL (ref 24–336)

## 2018-03-01 LAB — GLUCOSE, CAPILLARY
GLUCOSE-CAPILLARY: 166 mg/dL — AB (ref 65–99)
GLUCOSE-CAPILLARY: 110 mg/dL — AB (ref 65–99)
Glucose-Capillary: 90 mg/dL (ref 65–99)
Glucose-Capillary: 187 mg/dL — ABNORMAL HIGH (ref 65–99)

## 2018-03-01 MED ORDER — CYANOCOBALAMIN 1000 MCG/ML IJ SOLN
1000.0000 ug | Freq: Once | INTRAMUSCULAR | Status: AC
  Administered 2018-03-01: 1000 ug via INTRAMUSCULAR
  Filled 2018-03-01: qty 1

## 2018-03-01 MED ORDER — CHLORHEXIDINE GLUCONATE CLOTH 2 % EX PADS
6.0000 | MEDICATED_PAD | Freq: Every day | CUTANEOUS | Status: DC
  Administered 2018-03-02: 6 via TOPICAL

## 2018-03-01 MED ORDER — FERROUS SULFATE 325 (65 FE) MG PO TABS
325.0000 mg | ORAL_TABLET | Freq: Every day | ORAL | Status: DC
  Administered 2018-03-02: 325 mg via ORAL
  Filled 2018-03-01: qty 1

## 2018-03-01 MED ORDER — MAGNESIUM SULFATE 4 GM/100ML IV SOLN
4.0000 g | Freq: Once | INTRAVENOUS | Status: AC
  Administered 2018-03-01: 4 g via INTRAVENOUS
  Filled 2018-03-01: qty 100

## 2018-03-01 MED ORDER — VITAMIN B-12 1000 MCG PO TABS
1000.0000 ug | ORAL_TABLET | Freq: Every day | ORAL | Status: DC
  Administered 2018-03-02: 1000 ug via ORAL
  Filled 2018-03-01: qty 1

## 2018-03-01 MED ORDER — MUPIROCIN 2 % EX OINT
1.0000 "application " | TOPICAL_OINTMENT | Freq: Two times a day (BID) | CUTANEOUS | Status: DC
  Administered 2018-03-01 – 2018-03-02 (×2): 1 via NASAL
  Filled 2018-03-01: qty 22

## 2018-03-01 NOTE — Progress Notes (Signed)
ANTICOAGULATION CONSULT NOTE - Follow Up Consult  Pharmacy Consult for Heparin Indication: chest pain/ACS  No Known Allergies  Patient Measurements: Height: 6\' 2"  (188 cm) Weight: 184 lb 4.9 oz (83.6 kg) IBW/kg (Calculated) : 82.2 Heparin Dosing Weight:   Vital Signs: Temp: 97.9 F (36.6 C) (04/14 2156) Temp Source: Oral (04/14 2156) BP: 138/67 (04/14 2156) Pulse Rate: 65 (04/14 2156)  Labs: Recent Labs    02/27/18 1133 02/27/18 1159 02/27/18 1506 02/27/18 2032 02/28/18 0238 02/28/18 2317 03/01/18 0504 03/01/18 0906 03/01/18 1940  HGB 10.9* 10.1*  --   --  9.2*  --  8.9*  --   --   HCT 32.0* 30.7*  --   --  27.9*  --  27.1*  --   --   PLT  --  101*  --   --  90*  --  90*  --   --   HEPARINUNFRC  --   --   --   --   --  0.24*  --  0.23* 0.24*  CREATININE 2.00*  --   --   --  2.02*  --  2.02*  --   --   TROPONINI  --   --  0.06* 0.20* 0.32*  --   --   --   --     Estimated Creatinine Clearance: 27.7 mL/min (A) (by C-G formula based on SCr of 2.02 mg/dL (H)).   Medications:  Infusions:  . heparin 1,200 Units/hr (03/01/18 1122)  . lactated ringers    . lactated ringers 100 mL/hr at 03/01/18 0354    Assessment: Patient with heparin level low.  No heparin issues per RN.  Goal of Therapy:  Heparin level 0.3-0.7 units/ml Monitor platelets by anticoagulation protocol: Yes   Plan:  Increase heparin to 1300 units/hr Recheck level at [place] 03/01/2018,10:54 PM

## 2018-03-01 NOTE — Progress Notes (Signed)
PROGRESS NOTE    Chad Mcmillan  SJG:[id number] DOB: 1924/03/01 DOA: 02/27/2018 PCP: [name], MD   Brief Narrative: 84 year old with past medical history relevant for coronary artery disease with no intervention, hypertension, type 2 diabetes on insulin, hyperlipidemia, stage III CKD, dementia prior CVA who was admitted with presyncope and fall and found to have likely NSTEMI and possibly pneumonia.    Assessment & Plan:   Active Problems:   HCAP (healthcare-associated pneumonia)   #) NSTEMI: Elevated troponins in V5 V6 depressions -Continue heparin drip -Continue carvedilol 6.25 mg twice daily -Continue lisinopril 10 mg daily -Continue atorvastatin 80 mg daily -Continue aspirin 81 mg -Echo shows only very slight wall motion abnormality with normal EF -A1c is 6.6 which based on his current age would be appropriate -Lipid panel reassuring with LDL of 46, total cholesterol of 106  #) Possible pneumonia: Noted to have new opacities on CT and chest x-ray. -Continue doxycycline started 02/28/2018 -Pro-calcitonin initially baseline is 0.19, will recheck in 48 hours and consider discontinuation of antibiotics  #) History of coronary artery disease: Patient reports an MI at approximately age 30 with no intervention. -Continue statin, beta-blocker, ACE inhibitor, aspirin  #) Type 2 diabetes on insulin: -Continue aspart 5 units 3 times daily with meals -Continue NPH 5 units nightly -Sliding scale insulin, before meals at bedtime -Carb restricted diet  #) Anemia: Stable.  Combination of iron deficiency and B12 deficiency - B12 low at 170, will start B12 supplementation -Pending folate -Ferritin normal, Low TIBC and T sat - We will start iron supplementation 325 mg ferrous sulfate daily  #)  CVA x2: No apparent residual deficits -Continue aspirin 81 mg  -continue statin  #) Stage III CKD: stable  -ACE inhibitor per above  #) Hypertension: -Continue amlodipine 5 mg -Start  beta-blocker and ACE inhibitor per above  #) Dementia: -Continue donepezil 5 mg nightly  Fluids: Tolerating p.o. Elect lites: Monitor and supplement Nutrition: Carb restricted/heart healthy diet  Prophylaxis: Heparin drip  Disposition: Pending medical treatment of NST EMI and evaluation of possible pneumonia as well as PT evaluation  Full code    Consultants:   None  Procedures: (Don't include imaging studies which can be auto populated. Include things that cannot be auto populated i.e. Echo, Carotid and venous dopplers, Foley, Bipap, HD, tubes/drains, wound vac, central lines etc)  02/28/2018 echo: Pending  Antimicrobials: (specify start and planned stop date. Auto populated tables are space occupying and do not give end dates)  IV cefepime and vancomycin  02/27/2018 to 02/28/2018  IV ceftriaxone and azithromycin 02/28/2018 to ongoing   Subjective: Patient reports he is doing well.  He is reports frustration with how bad his back hurts.  He denies any nausea, vomiting, chest pain, cough, congestion, shortness of breath. Objective: Vitals:   02/28/18 0812 02/28/18 1504 02/28/18 2118 03/01/18 0608  BP: (!) 185/72 139/65 (!) 142/60 (!) 150/62  Pulse: 80 79 68 69  Resp: 18 17 18 16   Temp: 97.6 F (36.4 C) 98.1 F (36.7 C) 97.8 F (36.6 C) 98.4 F (36.9 C)  TempSrc: Oral Oral Oral Oral  SpO2: 98% 99% 98% 98%  Weight:    83.6 kg (184 lb 4.9 oz)  Height:        Intake/Output Summary (Last 24 hours) at 03/01/2018 1326 Last data filed at 03/01/2018 0612 Gross per 24 hour  Intake 2960.59 ml  Output 1500 ml  Net 1460.59 ml   Filed Weights   02/27/18 1019 02/28/18  0600 03/01/18 0608  Weight: 86.2 kg (190 lb) 81.9 kg (180 lb 8.9 oz) 83.6 kg (184 lb 4.9 oz)    Examination:  General exam: Appears calm and comfortable  Respiratory system: Clear to auscultation. Respiratory effort normal. Cardiovascular system: Regular rate and rhythm, normal S1 and S2, no  murmurs Gastrointestinal system: Soft, nontender, nondistended, plus bowel sounds Central nervous system: Alert and oriented.  Grossly moving all extremities Extremities: No lower extremity edema Skin: Senile purpura on bilateral forearms Psychiatry: Judgement and insight appear limited l. Mood & affect appropriate.     Data Reviewed: I have personally reviewed following labs and imaging studies  CBC: Recent Labs  Lab 02/27/18 1133 02/27/18 1159 02/28/18 0238 03/01/18 0504  WBC  --  7.7 6.5 5.5  NEUTROABS  --  6.8  --   --   HGB 10.9* 10.1* 9.2* 8.9*  HCT 32.0* 30.7* 27.9* 27.1*  MCV  --  95.0 96.5 96.8  PLT  --  101* 90* 90*   Basic Metabolic Panel: Recent Labs  Lab 02/27/18 1126 02/27/18 1133 02/27/18 1506 02/28/18 0238 03/01/18 0504  NA 143 141  --  143 139  K 5.6* 5.3*  --  4.7 4.4  CL 111 110  --  111 108  CO2 18*  --   --  21* 23  GLUCOSE 311* 312*  --  144* 101*  BUN 36* 38*  --  34* 32*  CREATININE 2.06* 2.00*  --  2.02* 2.02*  CALCIUM 9.2  --   --  8.3* 7.9*  MG  --   --  1.9 1.8 1.6*   GFR: Estimated Creatinine Clearance: 27.7 mL/min (A) (by C-G formula based on SCr of 2.02 mg/dL (H)). Liver Function Tests: Recent Labs  Lab 02/27/18 1126 02/28/18 0238  AST 37 24  ALT 20 16*  ALKPHOS 111 90  BILITOT 1.0 0.8  PROT 7.0 5.7*  ALBUMIN 3.6 2.8*   No results for input(s): LIPASE, AMYLASE in the last 168 hours. No results for input(s): AMMONIA in the last 168 hours. Coagulation Profile: No results for input(s): INR, PROTIME in the last 168 hours. Cardiac Enzymes: Recent Labs  Lab 02/27/18 1126 02/27/18 1506 02/27/18 2032 02/28/18 0238  TROPONINI 0.03* 0.06* 0.20* 0.32*   BNP (last 3 results) No results for input(s): PROBNP in the last 8760 hours. HbA1C: Recent Labs    02/27/18 1159 02/28/18 1235  HGBA1C 6.5* 6.6*   CBG: Recent Labs  Lab 02/28/18 1232 02/28/18 1724 02/28/18 2149 03/01/18 0744 03/01/18 1121  GLUCAP 225* 144* 114*  90 166*   Lipid Profile: Recent Labs    02/28/18 0237  CHOL 106  HDL 42  LDLCALC 46  TRIG 88  CHOLHDL 2.5   Thyroid Function Tests: No results for input(s): TSH, T4TOTAL, FREET4, T3FREE, THYROIDAB in the last 72 hours. Anemia Panel: Recent Labs    03/01/18 0504  VITAMINB12 170*  FERRITIN 98  TIBC 203*  IRON 28*   Sepsis Labs: Recent Labs  Lab 02/28/18 1235 03/01/18 0504  PROCALCITON 0.19 0.19    Recent Results (from the past 240 hour(s))  Urine culture     Status: None   Collection Time: 02/27/18 12:26 PM  Result Value Ref Range Status   Specimen Description   Final    URINE, CLEAN CATCH Performed at [facility], [place] [address], [place], [place] [address]    Special Requests   Final    NONE Performed at [facility], [place] [address] [place], [place] [address]    Culture   Final    NO GROWTH Performed at [facility], [place] [address], [place], [place] [address]    Report Status 02/28/2018 FINAL  Final  Culture, sputum-assessment     Status: None   Collection Time: 02/27/18  2:48 PM  Result Value Ref Range Status   Specimen Description SPUTUM  Final   Special Requests NONE  Final   Sputum evaluation   Final    THIS SPECIMEN IS ACCEPTABLE FOR SPUTUM CULTURE Performed at [facility], [place] [address] [place] [employer], [place] [address]    Report Status 02/28/2018 FINAL  Final  Culture, respiratory (NON-Expectorated)     Status: None (Preliminary result)   Collection Time: 02/27/18  2:48 PM  Result Value Ref Range Status   Specimen Description   Final    SPUTUM Performed at [facility] [address] [place], [place] [address]    Special Requests   Final    NONE Reflexed from [id number] Performed at [facility], [place] [address] [place], [place] [address]    Gram Stain   Final    FEW WBC PRESENT,BOTH PMN AND MONONUCLEAR MODERATE SQUAMOUS EPITHELIAL CELLS  PRESENT MODERATE GRAM POSITIVE COCCI MODERATE YEAST WITH PSEUDOHYPHAE MODERATE GRAM NEGATIVE RODS    Culture   Final    CULTURE REINCUBATED FOR BETTER GROWTH Performed at [facility], [place] [address] [place], [place] [address]    Report Status PENDING  Incomplete  MRSA PCR Screening     Status: Abnormal   Collection Time: 02/27/18  2:49 PM  Result Value Ref Range Status   MRSA by PCR POSITIVE (A) NEGATIVE Final    Comment:        The GeneXpert MRSA Assay (FDA approved for NASAL specimens only), is one component of a comprehensive MRSA colonization surveillance program. It is not intended to diagnose MRSA infection nor to guide or monitor treatment for MRSA infections. RESULT CALLED TO, READ BACK BY AND VERIFIED WITH: K.NEWSOME RN 02/28/2018 1737 JR Performed at [facility], [place] [address], [place], [place] [address]   Blood culture (routine x 2)     Status: None (Preliminary result)   Collection Time: 02/27/18  2:56 PM  Result Value Ref Range Status   Specimen Description   Final    BLOOD LEFT ANTECUBITAL Performed at [place] [address] [place], [place] [address]    Special Requests   Final    BOTTLES DRAWN AEROBIC AND ANAEROBIC Blood Culture adequate volume Performed at [place] [address] [place], [place] [address]    Culture   Final    NO GROWTH 2 DAYS Performed at [place] [address], [place], [place] [address]    Report Status PENDING  Incomplete  Blood culture (routine x 2)     Status: None (Preliminary result)   Collection Time: 02/27/18  3:00 PM  Result Value Ref Range Status   Specimen Description   Final    BLOOD RIGHT ANTECUBITAL Performed at [place] [address], [place], [place] [address]    Special Requests   Final    BOTTLES DRAWN AEROBIC AND ANAEROBIC Blood Culture results may not be optimal due to an excessive volume of  blood received in culture bottles Performed at [place] [address], [place], [place] [address]  Culture   Final    NO GROWTH 2 DAYS Performed at [facility], [place] [address], [place], [place] [address]    Report Status PENDING  Incomplete         Radiology Studies: No results found.      Scheduled Meds: . amLODipine  5 mg Oral Daily  . aspirin EC  81 mg Oral Daily  . atorvastatin  80 mg Oral q1800  . carvedilol  6.25 mg Oral BID WC  . doxycycline  100 mg Oral Q12H  . insulin aspart  0-5 Units Subcutaneous QHS  . insulin aspart  0-9 Units Subcutaneous TID WC  . insulin aspart  5 Units Subcutaneous TID WC  . insulin NPH Human  5 Units Subcutaneous QHS  . lisinopril  10 mg Oral Daily  . Vitamin D (Ergocalciferol)  50,000 Units Oral Once per day on Mon Wed Fri   Continuous Infusions: . heparin 1,200 Units/hr (03/01/18 1122)  . lactated ringers    . lactated ringers 100 mL/hr at 03/01/18 0354     LOS: 2 days    Time spent: 52    Cristy Folks, MD [facility]   If 7PM-7AM, please contact night-coverage [email or web address] Password [email or web address] 03/01/2018, 1:26 PM

## 2018-03-01 NOTE — Progress Notes (Signed)
ANTICOAGULATION CONSULT NOTE  Pharmacy Consult for Heparin Indication: chest pain/ACS  No Known Allergies  Patient Measurements: Height: 6\' 2"  (188 cm) Weight: 184 lb 4.9 oz (83.6 kg) IBW/kg (Calculated) : 82.2  Vital Signs: Temp: 98.4 F (36.9 C) (04/14 0608) Temp Source: Oral (04/14 0608) BP: 150/62 (04/14 0608) Pulse Rate: 69 (04/14 0608)  Labs: Recent Labs    02/27/18 1133 02/27/18 1159 02/27/18 1506 02/27/18 2032 02/28/18 0238 02/28/18 2317 03/01/18 0504 03/01/18 0906  HGB 10.9* 10.1*  --   --  9.2*  --  8.9*  --   HCT 32.0* 30.7*  --   --  27.9*  --  27.1*  --   PLT  --  101*  --   --  90*  --  90*  --   HEPARINUNFRC  --   --   --   --   --  0.24*  --  0.23*  CREATININE 2.00*  --   --   --  2.02*  --  2.02*  --   TROPONINI  --   --  0.06* 0.20* 0.32*  --   --   --     Estimated Creatinine Clearance: 27.7 mL/min (A) (by C-G formula based on SCr of 2.02 mg/dL (H)).   Medical History: Past Medical History:  Diagnosis Date  . ADENOCARCINOMA, PROSTATE 06/01/2008  . CORONARY ARTERY DISEASE 06/13/2007  . CVA 06/01/2008   x 2  . DIABETES MELLITUS, TYPE I, WITH OPHTHALMIC COMPLICATIONS 11/23/2692  . 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 HYPERKALEMIA 06/01/2008   due to DM neparopathy  . HYPERLIPIDEMIA 06/13/2007  . HYPERTENSION 06/13/2007  . NEOPLASM, MALIGNANT, SKIN, FACE 06/01/2008  . Other chronic nonalcoholic liver disease 8/54/6270  . RENAL DISEASE 06/01/2008    Medications:  Infusions:  . heparin 1,100 Units/hr (03/01/18 0354)  . lactated ringers    . lactated ringers 100 mL/hr at 03/01/18 0354  . magnesium sulfate 1 - 4 g bolus IVPB 4 g (03/01/18 0959)    Assessment: 74 yoM admitted yesterday with PNA.  PMH significant for CAD, Afib/stroke.  Troponin elevated & continues to increase.  No subjective complaints of chest pain.  No EKG changes on admission- prolonged Qtc.   CBC reviewed- baseline pltc ~100, Hg low on admit @ 10.9.   Rec'd Lovenox 30mg  SQ at 2230 on  4/12.  03/01/2018:  Heparin level 0.23 on 1100 units/hr (goal 0.3-0.7)  CBC: Pltc low but stable (90), Hg continues to trend down (8.9)  RN reports no bleeding or infusion related issues, but is concerned for patient's bleeding risk on IV heparin  Goal of Therapy:  Heparin level 0.3-0.7 units/ml -->target lower end of goal range Monitor platelets by anticoagulation protocol: Yes   Plan:  Increase Heparin infusion slightly to 1200 units/hr (conservative adjustments due to concern for bleeding risk) Re-check 8h heparin level Daily heparin level & CBC while on heparin  Biagio Borg 03/01/2018,10:36 AM

## 2018-03-01 NOTE — Evaluation (Signed)
Physical Therapy Evaluation Patient Details Name: Chad Mcmillan MRN: [id number] DOB: [birth year] Today's Date: 03/01/2018   History of Present Illness  84 year old with past medical history relevant for coronary artery disease with no intervention, hypertension, type 2 diabetes on insulin, hyperlipidemia, stage III CKD, dementia prior CVA who was admitted with presyncope and fall and found to have likely NSTEMI and possibly pneumonia.   Clinical Impression  Pt admitted with above diagnosis. Pt currently with functional limitations due to the deficits listed below (see PT Problem List).  Pt will benefit from skilled PT to increase their independence and safety with mobility to allow discharge to the venue listed below.  Pt with history of frequent falls.  His gait is unsteady and alternates between shuffeling and then increased step length with wide BOS.  Recommend SNF, although pt is hesitant to go to rehab.  If he refuses, then recommend maximizing HH services. HR 80 and o2 97% on RA with gait.     Follow Up Recommendations SNF    Equipment Recommendations  None recommended by PT    Recommendations for Other Services       Precautions / Restrictions Precautions Precautions: Fall Precaution Comments: 5 falls in last 6 months with 2 of them in last month. States he always falls backwards.      Mobility  Bed Mobility Overal bed mobility: Needs Assistance Bed Mobility: Supine to Sit     Supine to sit: Min assist        Transfers Overall transfer level: Needs assistance Equipment used: Rolling walker (2 wheeled) Transfers: Sit to/from Stand Sit to Stand: Min assist;+2 physical assistance         General transfer comment: cues for hand placement and for safe technique.  Ambulation/Gait Ambulation/Gait assistance: Min assist;+2 safety/equipment Ambulation Distance (Feet): 65 Feet Assistive device: Rolling walker (2 wheeled) Gait Pattern/deviations: Shuffle;Decreased step  length - right;Decreased step length - left;Wide base of support Gait velocity: decreased   General Gait Details: Initially pt had extremely shuffeled gait with almost no floor clearance.  After about 39' he was able to increase step length with foot clearance and wide BOS.  As he approached needing to turn, he began to shuffle again.  HR 90 and o2 97% on RA   Stairs            Wheelchair Mobility    Modified Rankin (Stroke Patients Only)       Balance Overall balance assessment: History of Falls;Needs assistance   Sitting balance-Leahy Scale: Fair       Standing balance-Leahy Scale: Poor Standing balance comment: requires UE assist                             Pertinent Vitals/Pain Pain Assessment: No/denies pain    Home Living Family/patient expects to be discharged to:: Private residence Living Arrangements: Spouse/significant other Available Help at Discharge: Family;Available 24 hours/day Type of Home: House Home Access: Ramped entrance     Home Layout: One level Home Equipment: Walker - 4 wheels;Cane - single point;Toilet riser Additional Comments: Having a walk in shower put in.    Prior Function Level of Independence: Independent with assistive device(s)         Comments: Has ambulated with cane as needed for about 3 years, but has been using a RW the last several weeks     Hand Dominance   Dominant Hand: Right    Extremity/Trunk Assessment  Upper Extremity Assessment Upper Extremity Assessment: Overall WFL for tasks assessed    Lower Extremity Assessment Lower Extremity Assessment: Overall WFL for tasks assessed;Generalized weakness       Communication   Communication: HOH  Cognition Arousal/Alertness: Awake/alert Behavior During Therapy: WFL for tasks assessed/performed Overall Cognitive Status: Within Functional Limits for tasks assessed                                        General Comments       Exercises     Assessment/Plan    PT Assessment Patient needs continued PT services  PT Problem List Decreased activity tolerance;Decreased balance;Decreased mobility;Decreased safety awareness       PT Treatment Interventions DME instruction;Gait training;Functional mobility training;Therapeutic activities;Therapeutic exercise;Neuromuscular re-education;Balance training;Patient/family education    PT Goals (Current goals can be found in the Care Plan section)  Acute Rehab PT Goals Patient Stated Goal: figure out why he falls and to return home PT Goal Formulation: With patient Time For Goal Achievement: 03/15/18    Frequency Min 3X/week   Barriers to discharge Decreased caregiver support      Co-evaluation               AM-PAC PT "6 Clicks" Daily Activity  Outcome Measure Difficulty turning over in bed (including adjusting bedclothes, sheets and blankets)?: A Lot Difficulty moving from lying on back to sitting on the side of the bed? : Unable Difficulty sitting down on and standing up from a chair with arms (e.g., wheelchair, bedside commode, etc,.)?: Unable Help needed moving to and from a bed to chair (including a wheelchair)?: A Little Help needed walking in hospital room?: A Little Help needed climbing 3-5 steps with a railing? : A Lot 6 Click Score: 12    End of Session Equipment Utilized During Treatment: Gait belt Activity Tolerance: Patient limited by fatigue;Patient tolerated treatment well Patient left: in chair;with call bell/phone within reach;with chair alarm set Nurse Communication: Mobility status PT Visit Diagnosis: Unsteadiness on feet (R26.81);Ataxic gait (R26.0);History of falling (Z91.81);Other abnormalities of gait and mobility (R26.89)    Time: 0258-5277 PT Time Calculation (min) (ACUTE ONLY): 26 min   Charges:   PT Evaluation $PT Eval Moderate Complexity: 1 Mod PT Treatments $Gait Training: 8-22 mins   PT G Codes:        [name] L.  Tamala Julian, [place] Pager [phone] 03/01/2018   [name] 03/01/2018, 3:18 PM

## 2018-03-02 ENCOUNTER — Ambulatory Visit: Payer: Self-pay

## 2018-03-02 DIAGNOSIS — I214 Non-ST elevation (NSTEMI) myocardial infarction: Principal | ICD-10-CM

## 2018-03-02 LAB — CULTURE, RESPIRATORY: CULTURE: NORMAL

## 2018-03-02 LAB — GLUCOSE, CAPILLARY
GLUCOSE-CAPILLARY: 111 mg/dL — AB (ref 65–99)
Glucose-Capillary: 166 mg/dL — ABNORMAL HIGH (ref 65–99)

## 2018-03-02 LAB — CBC
HCT: 27.4 % — ABNORMAL LOW (ref 39.0–52.0)
Hemoglobin: 9.1 g/dL — ABNORMAL LOW (ref 13.0–17.0)
MCH: 31.9 pg (ref 26.0–34.0)
MCHC: 33.2 g/dL (ref 30.0–36.0)
MCV: 96.1 fL (ref 78.0–100.0)
Platelets: 88 10*3/uL — ABNORMAL LOW (ref 150–400)
RBC: 2.85 MIL/uL — ABNORMAL LOW (ref 4.22–5.81)
RDW: 13.8 % (ref 11.5–15.5)
WBC: 5 K/uL (ref 4.0–10.5)

## 2018-03-02 LAB — BASIC METABOLIC PANEL WITH GFR
CO2: 23 mmol/L (ref 22–32)
Calcium: 8.2 mg/dL — ABNORMAL LOW (ref 8.9–10.3)
Chloride: 107 mmol/L (ref 101–111)
GFR calc Af Amer: 36 mL/min — ABNORMAL LOW (ref >60)
Glucose, Bld: 102 mg/dL — ABNORMAL HIGH (ref 65–99)

## 2018-03-02 LAB — FOLATE RBC
Folate, Hemolysate: 298.9 ng/mL
Folate, RBC: 1137 ng/mL (ref >498)
Hematocrit: 26.3 % — ABNORMAL LOW (ref 37.5–51.0)

## 2018-03-02 LAB — BASIC METABOLIC PANEL
Anion gap: 6 (ref 5–15)
BUN: 32 mg/dL — ABNORMAL HIGH (ref 6–20)
Creatinine, Ser: 1.82 mg/dL — ABNORMAL HIGH (ref 0.61–1.24)
GFR calc non Af Amer: 31 mL/min — ABNORMAL LOW (ref >60)
Potassium: 4.3 mmol/L (ref 3.5–5.1)
Sodium: 136 mmol/L (ref 135–145)

## 2018-03-02 LAB — MAGNESIUM: Magnesium: 2.3 mg/dL (ref 1.7–2.4)

## 2018-03-02 LAB — CULTURE, RESPIRATORY W GRAM STAIN

## 2018-03-02 LAB — PROCALCITONIN: Procalcitonin: 0.15 ng/mL

## 2018-03-02 LAB — HEPARIN LEVEL (UNFRACTIONATED): Heparin Unfractionated: 0.43 [IU]/mL (ref 0.30–0.70)

## 2018-03-02 MED ORDER — DOXYCYCLINE HYCLATE 100 MG PO TABS: 100.0000 mg | ORAL_TABLET | Freq: Two times a day (BID) | ORAL | 0 refills | Status: AC

## 2018-03-02 MED ORDER — FERROUS SULFATE 325 (65 FE) MG PO TABS: 325.0000 mg | ORAL_TABLET | Freq: Every day | ORAL | 3 refills | Status: DC

## 2018-03-02 MED ORDER — CYANOCOBALAMIN 1000 MCG PO TABS: 1000.0000 ug | ORAL_TABLET | Freq: Every day | ORAL | 1 refills | Status: DC

## 2018-03-02 MED ORDER — CARVEDILOL 6.25 MG PO TABS: 6.2500 mg | ORAL_TABLET | Freq: Two times a day (BID) | ORAL | 1 refills | Status: DC

## 2018-03-02 MED ORDER — ATORVASTATIN CALCIUM 40 MG PO TABS: 80.0000 mg | ORAL_TABLET | Freq: Every day | ORAL | 1 refills | Status: DC

## 2018-03-02 MED ORDER — LISINOPRIL 20 MG PO TABS
20.0000 mg | ORAL_TABLET | Freq: Every day | ORAL | Status: DC
  Administered 2018-03-02: 20 mg via ORAL
  Filled 2018-03-02: qty 1

## 2018-03-02 MED ORDER — LISINOPRIL 20 MG PO TABS: 20.0000 mg | ORAL_TABLET | Freq: Every day | ORAL | 1 refills | Status: DC

## 2018-03-02 NOTE — Progress Notes (Addendum)
ANTICOAGULATION CONSULT NOTE  Pharmacy Consult for IV heparin Indication: NSTEMI  No Known Allergies  Patient Measurements: Height: 6\' 2"  (188 cm) Weight: 184 lb 15.5 oz (83.9 kg) IBW/kg (Calculated) : 82.2  Vital Signs: Temp: 98.1 F (36.7 C) (04/15 0517) Temp Source: Oral (04/15 0517) BP: 177/61 (04/15 0517) Pulse Rate: 66 (04/15 0517)  Labs: Recent Labs    02/27/18 1133  02/27/18 1506 02/27/18 2032 02/28/18 0238  03/01/18 0504 03/01/18 0906 03/01/18 1940 03/02/18 0751  HGB 10.9*   < >  --   --  9.2*  --  8.9*  --   --  9.1*  HCT 32.0*   < >  --   --  27.9*  --  27.1*  --   --  27.4*  PLT  --    < >  --   --  90*  --  90*  --   --  88*  HEPARINUNFRC  --   --   --   --   --    < >  --  0.23* 0.24* 0.43  CREATININE 2.00*  --   --   --  2.02*  --  2.02*  --   --   --   TROPONINI  --   --  0.06* 0.20* 0.32*  --   --   --   --   --    < > = values in this interval not displayed.    Estimated Creatinine Clearance: 27.7 mL/min (A) (by C-G formula based on SCr of 2.02 mg/dL (H)).   Medical History: Past Medical History:  Diagnosis Date  . ADENOCARCINOMA, PROSTATE 06/01/2008  . CORONARY ARTERY DISEASE 06/13/2007  . CVA 06/01/2008   x 2  . DIABETES MELLITUS, TYPE I, WITH OPHTHALMIC COMPLICATIONS 8/34/1962  . 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 HYPERKALEMIA 06/01/2008   due to DM neparopathy  . HYPERLIPIDEMIA 06/13/2007  . HYPERTENSION 06/13/2007  . NEOPLASM, MALIGNANT, SKIN, FACE 06/01/2008  . Other chronic nonalcoholic liver disease 2/29/7989  . RENAL DISEASE 06/01/2008    Medications:  Infusions:  . heparin 1,300 Units/hr (03/01/18 2247)  . lactated ringers      Assessment: 23 yoM admitted yesterday with PNA.  PMH significant for CAD, Afib/stroke.  Troponin elevated & continues to increase.  No subjective complaints of chest pain.  No EKG changes on admission- prolonged QTc.   CBC reviewed- baseline Pltc ~100K, Hgb low on admit @ 10.9.   Rec'd Lovenox 30mg  SQ at 2230 on  4/12.  03/02/2018:  Heparin level = 0.43 units/mL, now therapeutic on heparin infusion at 1300 units/hr  CBC: Pltc low/slightly decreased (88K), Hgb low but stable at 9.1  RN reports no bleeding or infusion related issues  Goal of Therapy:  Heparin level 0.3-0.7 units/ml -->target lower end of goal range Monitor platelets by anticoagulation protocol: Yes   Plan:   Continue IV heparin at 1300 units/hr   Re-check 8h heparin level to confirm remains within therapeutic range  Daily heparin level & CBC while on heparin  Monitor closely for s/sx of bleeding   Lindell Spar, PharmD, BCPS Pager: [phone] 03/02/2018 8:29 AM

## 2018-03-02 NOTE — Discharge Summary (Addendum)
Physician Discharge Summary  RYDGE TEXIDOR QBH:[id number] DOB: 07/11/1924 DOA: 02/27/2018  PCP: [name], MD  Admit date: 02/27/2018 Discharge date: 03/02/2018  Admitted From: Home Disposition: Home (skilled nursing facility was recommended by physical therapy however patient refused)  Recommendations for Outpatient Follow-up:  1. Follow up with PCP in 1-2 weeks 2. Please obtain BMP/CBC in one week 3. Please check a vitamin B12 and iron studies in approximately 1 month  Home Health: Yes, home health PT Equipment/Devices: None  Discharge Condition: Stable) CODE STATUS: Full Diet recommendation: Heart Healthy  Brief/Interim Summary:  #) NST EMI with history of coronary artery disease: Patient was admitted with vertigo and presyncope.  Patient was found to have likely an ST EMI along with depressions along V5 and V6.  Echo done on 02/28/2018 showed EF of 50-55% with mild hypokinesis basal and mid anterolateral myocardium.  Patient was started on carvedilol and lisinopril.  He was changed from pravastatin to atorvastatin.  He was told to continue his baby aspirin.  He was given 3 days of heparin drip to treat his NST EMI medically.  He was told to follow-up with his outpatient PCP.  #) Possible pneumonia: Patient was noted to have new opacities on CT and chest x-ray on admission.  His initial pro calcitonin was 0.19 and 48 hours later was 0.15.  He was initially on IV cefepime and vancomycin and rapidly transition to oral doxycycline to complete a total of 5 days.  #) Anemia: Patient was noted to have chronic anemia.  Patient was noted to have low iron including a low TIBC and T7 and a low B12.  He was given 1 shot of B12 and sent home on oral B12 tablets.  He was started on oral iron supplementation.  This should be rechecked as an outpatient.  #) Type 2 diabetes: Patient was continued on home aspart and NPH.  His A1c was 6.6 which was felt to be appropriate for him.  #) CVA x2: Patient  was noted not to have any residual deficits.  He was continued on aspirin and statin.  #) Stage III chronic kidney disease: This was stable.  #) Hypertension: Patient was continued on home amlodipine 5 mg.  Patient was started on carvedilol and lisinopril per above.  #) Dementia: Patient was continued on home donepezil 5 mg nightly.  Discharge Diagnoses:  Active Problems:   HCAP (healthcare-associated pneumonia)    Discharge Instructions  Discharge Instructions    Call MD for:  difficulty breathing, headache or visual disturbances   Complete by:  As directed    Call MD for:  persistant nausea and vomiting   Complete by:  As directed    Call MD for:  redness, tenderness, or signs of infection (pain, swelling, redness, odor or green/yellow discharge around incision site)   Complete by:  As directed    Call MD for:  severe uncontrolled pain   Complete by:  As directed    Call MD for:  temperature >100.4   Complete by:  As directed    Diet - low sodium heart healthy   Complete by:  As directed    Discharge instructions   Complete by:  As directed    Follow-up with your primary care doctor in 1-2 weeks.   Increase activity slowly   Complete by:  As directed      Allergies as of 03/02/2018   No Known Allergies     Medication List    STOP taking these  medications   pravastatin 80 MG tablet Commonly known as:  PRAVACHOL     TAKE these medications   amLODipine 5 MG tablet Commonly known as:  NORVASC Take 1 tablet (5 mg total) by mouth daily.   aspirin EC 81 MG tablet Take 1 tablet (81 mg total) by mouth daily.   atorvastatin 40 MG tablet Commonly known as:  LIPITOR Take 2 tablets (80 mg total) by mouth daily at 6 PM.   carvedilol 6.25 MG tablet Commonly known as:  COREG Take 1 tablet (6.25 mg total) by mouth 2 (two) times daily with a meal.   cyanocobalamin 1000 MCG tablet Take 1 tablet (1,000 mcg total) by mouth daily. Start taking on:  03/03/2018   donepezil  5 MG tablet Commonly known as:  ARICEPT Take 1 tablet (5 mg total) by mouth at bedtime.   doxycycline 100 MG tablet Commonly known as:  VIBRA-TABS Take 1 tablet (100 mg total) by mouth every 12 (twelve) hours for 3 days.   ergocalciferol 50000 units capsule Commonly known as:  VITAMIN D2 Take 1 capsule (50,000 Units total) by mouth 3 (three) times a week.   ferrous sulfate 325 (65 FE) MG tablet Take 1 tablet (325 mg total) by mouth daily with breakfast. Start taking on:  03/03/2018   insulin lispro 100 UNIT/ML injection Commonly known as:  HUMALOG 3 times a day (just before each meal), 15-10-10 units   insulin NPH Human 100 UNIT/ML injection Commonly known as:  HUMULIN N Inject 0.05 mLs (5 Units total) into the skin at bedtime.   lisinopril 20 MG tablet Commonly known as:  PRINIVIL,ZESTRIL Take 1 tablet (20 mg total) by mouth daily. Start taking on:  03/03/2018   LORazepam 0.5 MG tablet Commonly known as:  ATIVAN Take 1 tablet (0.5 mg total) by mouth 2 (two) times daily as needed for anxiety.   meclizine 12.5 MG tablet Commonly known as:  ANTIVERT TAKE 1 TABLET BY MOUTH THREE TIMES DAILY AS NEEDED FOR DIZZINESS   nitroGLYCERIN 0.4 MG SL tablet Commonly known as:  NITROSTAT Place 1 tablet (0.4 mg total) under the tongue every 5 (five) minutes as needed. For chest pain       No Known Allergies  Consultations: None  Procedures/Studies: Dg Chest 2 View  Result Date: 02/27/2018 CLINICAL DATA:  Nausea, vomiting and diarrhea since 4 a.m. today. Increased weakness. History of hypertension and diabetes. EXAM: CHEST - 2 VIEW COMPARISON:  Chest x-rays dated 01/12/2018, 12/30/2017 and 04/23/2016. FINDINGS: Heart size and mediastinal contours are stable. Aortic atherosclerosis. New nodular opacity within the RIGHT mid lung region. Persistent ill-defined opacity at the RIGHT lung base is not significantly changed. No pneumothorax seen. No acute or suspicious osseous finding. Mild  degenerative spurring and/or ankylosis within the mid and lower thoracic spine. IMPRESSION: 1. New nodular opacity within the RIGHT mid lung region. Neoplastic pulmonary nodule cannot be excluded. Recommend chest CT for further characterization. 2. Persistent ill-defined opacity at the RIGHT lung base, not significantly changed compared to previous studies dating back to 2017 suggesting chronic atelectasis. 3. Rounded opacity at the medial aspects of the LEFT lung base, best seen on the AP view, corresponding to the basilar opacity/consolidation described on chest CT of 01/12/2018 as a possible pneumonia. Neoplastic mass is also a possibility. Again, recommend chest CT for further characterization. Electronically Signed   By: Franki Cabot M.D.   On: 02/27/2018 11:02   Ct Head Wo Contrast  Result Date: 02/27/2018 CLINICAL DATA:  Generalized weakness EXAM: CT HEAD WITHOUT CONTRAST TECHNIQUE: Contiguous axial images were obtained from the base of the skull through the vertex without intravenous contrast. COMPARISON:  01/12/2018 FINDINGS: Brain: There is atrophy and chronic small vessel disease changes. No acute intracranial abnormality. Specifically, no hemorrhage, hydrocephalus, mass lesion, acute infarction, or significant intracranial injury. Vascular: No hyperdense vessel or unexpected calcification. Skull: No acute calvarial abnormality. Sinuses/Orbits: No acute finding Other: None IMPRESSION: No acute intracranial abnormality. Atrophy, chronic microvascular disease. Electronically Signed   By: Rolm Baptise M.D.   On: 02/27/2018 11:04   Ct Chest Wo Contrast  Result Date: 02/27/2018 CLINICAL DATA:  Lung nodule, weakness EXAM: CT CHEST WITHOUT CONTRAST TECHNIQUE: Multidetector CT imaging of the chest was performed following the standard protocol without IV contrast. Sagittal and coronal MPR images reconstructed from axial data set. COMPARISON:  Chest radiograph 02/27/2018 FINDINGS: Cardiovascular:  Atherosclerotic calcifications aorta, coronary arteries and proximal great vessels. Upper normal caliber ascending thoracic aorta. No pericardial effusion. Mediastinum/Nodes: Esophagus normal appearance. Base of cervical region normal appearance. No thoracic adenopathy Lungs/Pleura: Small LEFT pleural effusion. Patchy somewhat nodular appearing infiltrates are identified in BILATERAL upper and lower lobes likely inflammatory/infectious. More confluent areas of consolidation in RIGHT middle lobe and LEFT lower lobe. Central peribronchial thickening. No pneumothorax. Unable to exclude small underlying pulmonary nodules. Upper Abdomen: Gallbladder surgically absent. Visualized upper abdomen otherwise unremarkable. Musculoskeletal: Demineralized with scattered degenerative disc disease changes of the cervical and thoracic spine. IMPRESSION: Peribronchial thickening with patchy BILATERAL pulmonary infiltrates greater on RIGHT, with more dense areas of consolidation in the RIGHT middle lobe and LEFT lower lobe. Findings are most consistent with an infectious/inflammatory process favor pneumonia. Short-term follow-up CT chest imaging recommended in 2-3 months following treatment to ensure resolution and exclude underlying pulmonary abnormalities including tumor. Scattered atherosclerotic calcifications including coronary arteries. Aortic Atherosclerosis (ICD10-I70.0). Electronically Signed   By: Lavonia Dana M.D.   On: 02/27/2018 13:32    02/28/2018 echo: - Left ventricle: The cavity size was normal. There was mild   concentric hypertrophy. Systolic function was at the lower limits   of normal. The estimated ejection fraction was in the range of   50% to 55%. Possible mild hypokinesis of the   basal-midanterolateral myocardium. There was fusion of early and   atrial contributions to ventricular filling. The study is not   technically sufficient to allow evaluation of LV diastolic   function. - Aortic valve: Mild  focal calcification involving the noncoronary   cusp. Valve area (Vmax): 2.2 cm^2. - Mitral valve: There was mild regurgitation. - Left atrium: The atrium was mildly dilated. - Pulmonary arteries: Systolic pressure was mildly increased. PA   peak pressure: 46 mm Hg (S).  Subjective:   Discharge Exam: Vitals:   03/01/18 2156 03/02/18 0517  BP: 138/67 (!) 177/61  Pulse: 65 66  Resp: 17 17  Temp: 97.9 F (36.6 C) 98.1 F (36.7 C)  SpO2: 97% 97%   Vitals:   03/01/18 1422 03/01/18 2156 03/02/18 0500 03/02/18 0517  BP: (!) 134/59 138/67  (!) 177/61  Pulse: 64 65  66  Resp: 19 17  17   Temp: (!) 97.4 F (36.3 C) 97.9 F (36.6 C)  98.1 F (36.7 C)  TempSrc: Oral Oral  Oral  SpO2: 99% 97%  97%  Weight:   83.9 kg (184 lb 15.5 oz)   Height:       General exam: Appears calm and comfortable  Respiratory system: Clear to auscultation. Respiratory effort normal.  Cardiovascular system: Regular rate and rhythm, normal S1 and S2, no murmurs Gastrointestinal system: Soft, nontender, nondistended, plus bowel sounds Central nervous system: Alert and oriented.  Grossly moving all extremities Extremities: No lower extremity edema Skin: Senile purpura on bilateral forearms Psychiatry: Judgement and insight appear limited l. Mood & affect appropriate.      The results of significant diagnostics from this hospitalization (including imaging, microbiology, ancillary and laboratory) are listed below for reference.     Microbiology: Recent Results (from the past 240 hour(s))  Urine culture     Status: None   Collection Time: 02/27/18 12:26 PM  Result Value Ref Range Status   Specimen Description   Final    URINE, CLEAN CATCH Performed at [facility], [place] [address], [place], [place] [address]    Special Requests   Final    NONE Performed at [facility], [place] [address], [place], [place] [address]    Culture   Final    NO GROWTH Performed at  [facility], [place] [address] [place], [place] [address]    Report Status 02/28/2018 FINAL  Final  Culture, sputum-assessment     Status: None   Collection Time: 02/27/18  2:48 PM  Result Value Ref Range Status   Specimen Description SPUTUM  Final   Special Requests NONE  Final   Sputum evaluation   Final    THIS SPECIMEN IS ACCEPTABLE FOR SPUTUM CULTURE Performed at [facility], [place] [address] [place], [place] [address]    Report Status 02/28/2018 FINAL  Final  Culture, respiratory (NON-Expectorated)     Status: None (Preliminary result)   Collection Time: 02/27/18  2:48 PM  Result Value Ref Range Status   Specimen Description   Final    SPUTUM Performed at [place] [address] [place], [place] [address]    Special Requests   Final    NONE Reflexed from [id number] Performed at [facility], [place] [address] [place], [place] [address]    Gram Stain   Final    FEW WBC PRESENT,BOTH PMN AND MONONUCLEAR MODERATE SQUAMOUS EPITHELIAL CELLS PRESENT MODERATE GRAM POSITIVE COCCI MODERATE YEAST WITH PSEUDOHYPHAE MODERATE GRAM NEGATIVE RODS    Culture   Final    CULTURE REINCUBATED FOR BETTER GROWTH Performed at [facility], [place] [address] [place], [place] [address]    Report Status PENDING  Incomplete  MRSA PCR Screening     Status: Abnormal   Collection Time: 02/27/18  2:49 PM  Result Value Ref Range Status   MRSA by PCR POSITIVE (A) NEGATIVE Final    Comment:        The GeneXpert MRSA Assay (FDA approved for NASAL specimens only), is one component of a comprehensive MRSA colonization surveillance program. It is not intended to diagnose MRSA infection nor to guide or monitor treatment for MRSA infections. RESULT CALLED TO, READ BACK BY AND VERIFIED WITH: K.NEWSOME RN 02/28/2018 1737 JR Performed at [facility], [place] [address], [place], [place] [address]   Blood culture  (routine x 2)     Status: None (Preliminary result)   Collection Time: 02/27/18  2:56 PM  Result Value Ref Range Status   Specimen Description   Final    BLOOD LEFT ANTECUBITAL Performed at [place] [address] [place], [place] [address]    Special Requests   Final    BOTTLES DRAWN AEROBIC AND  ANAEROBIC Blood Culture adequate volume Performed at [place] [address], [place], [place] [address]    Culture   Final    NO GROWTH 2 DAYS Performed at [place] [address] [place], [place] [address]    Report Status PENDING  Incomplete  Blood culture (routine x 2)     Status: None (Preliminary result)   Collection Time: 02/27/18  3:00 PM  Result Value Ref Range Status   Specimen Description   Final    BLOOD RIGHT ANTECUBITAL Performed at [place] [address] [place], [place] [address]    Special Requests   Final    BOTTLES DRAWN AEROBIC AND ANAEROBIC Blood Culture results may not be optimal due to an excessive volume of blood received in culture bottles Performed at [place] [address] [place], [place] [address]    Culture   Final    NO GROWTH 2 DAYS Performed at [place] [address], [place],  [address]    Report Status PENDING  Incomplete     Labs: BNP (last 3 results) No results for input(s): BNP in the last 8760 hours. Basic Metabolic Panel: Recent Labs  Lab 02/27/18 1126 02/27/18 1133 02/27/18 1506 02/28/18 0238 03/01/18 0504 03/02/18 0751  NA 143 141  --  143 139 136  K 5.6* 5.3*  --  4.7 4.4 4.3  CL 111 110  --  111 108 107  CO2 18*  --   --  21* 23 23  GLUCOSE 311* 312*  --  144* 101* 102*  BUN 36* 38*  --  34* 32* 32*  CREATININE 2.06* 2.00*  --  2.02* 2.02* 1.82*  CALCIUM 9.2  --   --  8.3* 7.9* 8.2*  MG  --   --  1.9 1.8 1.6* 2.3   Liver Function Tests: Recent Labs  Lab 02/27/18 1126 02/28/18 0238  AST 37 24   ALT 20 16*  ALKPHOS 111 90  BILITOT 1.0 0.8  PROT 7.0 5.7*  ALBUMIN 3.6 2.8*   No results for input(s): LIPASE, AMYLASE in the last 168 hours. No results for input(s): AMMONIA in the last 168 hours. CBC: Recent Labs  Lab 02/27/18 1133 02/27/18 1159 02/28/18 0238 03/01/18 0504 03/02/18 0751  WBC  --  7.7 6.5 5.5 5.0  NEUTROABS  --  6.8  --   --   --   HGB 10.9* 10.1* 9.2* 8.9* 9.1*  HCT 32.0* 30.7* 27.9* 27.1* 27.4*  MCV  --  95.0 96.5 96.8 96.1  PLT  --  101* 90* 90* 88*   Cardiac Enzymes: Recent Labs  Lab 02/27/18 1126 02/27/18 1506 02/27/18 2032 02/28/18 0238  TROPONINI 0.03* 0.06* 0.20* 0.32*   BNP: Invalid input(s): POCBNP CBG: Recent Labs  Lab 03/01/18 1121 03/01/18 1728 03/01/18 2156 03/02/18 0734 03/02/18 1131  GLUCAP 166* 110* 187* 111* 166*   D-Dimer No results for input(s): DDIMER in the last 72 hours. Hgb A1c Recent Labs    02/28/18 1235  HGBA1C 6.6*   Lipid Profile Recent Labs    02/28/18 0237  CHOL 106  HDL 42  LDLCALC 46  TRIG 88  CHOLHDL 2.5   Thyroid function studies No results for input(s): TSH, T4TOTAL, T3FREE, THYROIDAB in the last 72 hours.  Invalid input(s): FREET3 Anemia work up Recent Labs    03/01/18 0504  VITAMINB12 170*  FERRITIN 98  TIBC 203*  IRON 28*  Urinalysis    Component Value Date/Time   COLORURINE YELLOW 02/27/2018 1226   APPEARANCEUR CLEAR 02/27/2018 1226   LABSPEC 1.010 02/27/2018 1226   PHURINE 7.0 02/27/2018 1226   GLUCOSEU >=500 (A) 02/27/2018 1226   GLUCOSEU NEGATIVE 09/07/2014 1334   HGBUR SMALL (A) 02/27/2018 1226   BILIRUBINUR NEGATIVE 02/27/2018 1226   KETONESUR 5 (A) 02/27/2018 1226   PROTEINUR >=300 (A) 02/27/2018 1226   UROBILINOGEN 0.2 09/07/2014 1334   NITRITE NEGATIVE 02/27/2018 1226   LEUKOCYTESUR NEGATIVE 02/27/2018 1226   Sepsis Labs Invalid input(s): PROCALCITONIN,  WBC,  LACTICIDVEN Microbiology Recent Results (from the past 240 hour(s))  Urine culture     Status:  None   Collection Time: 02/27/18 12:26 PM  Result Value Ref Range Status   Specimen Description   Final    URINE, CLEAN CATCH Performed at [facility], [place] [address], [place], [place] [address]    Special Requests   Final    NONE Performed at [facility], [place] [address] [place], [place] [address]    Culture   Final    NO GROWTH Performed at [facility], [place] [address] [place], [place] [address]    Report Status 02/28/2018 FINAL  Final  Culture, sputum-assessment     Status: None   Collection Time: 02/27/18  2:48 PM  Result Value Ref Range Status   Specimen Description SPUTUM  Final   Special Requests NONE  Final   Sputum evaluation   Final    THIS SPECIMEN IS ACCEPTABLE FOR SPUTUM CULTURE Performed at [facility], [place] [address] [place], [place] [address]    Report Status 02/28/2018 FINAL  Final  Culture, respiratory (NON-Expectorated)     Status: None (Preliminary result)   Collection Time: 02/27/18  2:48 PM  Result Value Ref Range Status   Specimen Description   Final    SPUTUM Performed at [place] [address], [place], [place] [address]    Special Requests   Final    NONE Reflexed from [id number] Performed at [facility], [place] [address], [place], [place] [address]    Gram Stain   Final    FEW WBC PRESENT,BOTH PMN AND MONONUCLEAR MODERATE SQUAMOUS EPITHELIAL CELLS PRESENT MODERATE GRAM POSITIVE COCCI MODERATE YEAST WITH PSEUDOHYPHAE MODERATE GRAM NEGATIVE RODS    Culture   Final    CULTURE REINCUBATED FOR BETTER GROWTH Performed at [facility], [place] [address], [place], [place] [address]    Report Status PENDING  Incomplete  MRSA PCR Screening     Status: Abnormal   Collection Time: 02/27/18  2:49 PM  Result Value Ref Range Status   MRSA by PCR POSITIVE (A) NEGATIVE Final    Comment:        The GeneXpert MRSA Assay (FDA approved for NASAL  specimens only), is one component of a comprehensive MRSA colonization surveillance program. It is not intended to diagnose MRSA infection nor to guide or monitor treatment for MRSA infections. RESULT CALLED TO, READ BACK BY AND VERIFIED WITH: K.NEWSOME RN 02/28/2018 1737 JR Performed at [facility], [place] [address] [place],  [address]   Blood culture (routine x 2)     Status: None (Preliminary result)   Collection Time: 02/27/18  2:56 PM  Result Value Ref Range Status   Specimen Description   Final    BLOOD LEFT ANTECUBITAL Performed at [place], [place] [address]    Special Requests   Final    BOTTLES DRAWN AEROBIC AND ANAEROBIC Blood Culture adequate volume Performed at [place] [address], [place], [place] [address]    Culture   Final    NO GROWTH 2 DAYS Performed at [place] [address] [place], [place] [address]    Report Status PENDING  Incomplete  Blood culture (routine x 2)     Status: None (Preliminary result)   Collection Time: 02/27/18  3:00 PM  Result Value Ref Range Status   Specimen Description   Final    BLOOD RIGHT ANTECUBITAL Performed at [place] [address], [place], [place] [address]    Special Requests   Final    BOTTLES DRAWN AEROBIC AND ANAEROBIC Blood Culture results may not be optimal due to an excessive volume of blood received in culture bottles Performed at [place] [address] [place], [place] [address]    Culture   Final    NO GROWTH 2 DAYS Performed at [place] [address] [place], [place] [address]    Report Status PENDING  Incomplete     Time coordinating discharge: Over 30 minutes  SIGNED:   Cristy Folks, MD  [facility] 03/02/2018, 12:49 PM   If 7PM-7AM, please contact night-coverage [email or web address] Password [email or web address]

## 2018-03-02 NOTE — Care Management Important Message (Signed)
Important Message  Patient Details  Name: Chad Mcmillan MRN: [id number] Date of Birth: [birth year]   Medicare Important Message Given:  Yes    Kerin Salen 03/02/2018, 11:49 AMImportant Message  Patient Details  Name: Chad Mcmillan MRN: [id number] Date of Birth: 07/02/1924   Medicare Important Message Given:  Yes    Kerin Salen 03/02/2018, 11:49 AM

## 2018-03-02 NOTE — Discharge Instructions (Signed)
Heart Attack A heart attack (myocardial infarction, MI) causes damage to the heart that cannot be fixed. A heart attack often happens when a blood clot or other blockage cuts blood flow to the heart. When this happens, certain areas of the heart begin to die. This causes the pain you feel during a heart attack. Follow these instructions at home:  Take medicine as told by your doctor. You may need medicine to: ? Keep your blood from clotting too easily. ? Control your blood pressure. ? Lower your cholesterol. ? Control abnormal heart rhythms.  Change certain behaviors as told by your doctor. This may include: ? Quitting smoking. ? Being active. ? Eating a heart-healthy diet. Ask your doctor for help with this diet. ? Keeping a healthy weight. ? Keeping your diabetes under control. ? Lessening stress. ? Limiting how much alcohol you drink. Do not take these medicines unless your doctor says that you can:  Nonsteroidal anti-inflammatory drugs (NSAIDs). These include: ? Ibuprofen. ? Naproxen. ? Celecoxib.  Vitamin supplements that have vitamin A, vitamin E, or both.  Hormone therapy that contains estrogen with or without progestin.  Get help right away if:  You have sudden chest discomfort.  You have sudden discomfort in your: ? Arms. ? Back. ? Neck. ? Jaw.  You have shortness of breath at any time.  You have sudden sweating or clammy skin.  You feel sick to your stomach (nauseous) or throw up (vomit).  You suddenly get light-headed or dizzy.  You feel your heart beating fast or skipping beats. These symptoms may be an emergency. Do not wait to see if the symptoms will go away. Get medical help right away. Call your local emergency services (911 in the [place] Do not drive yourself to the hospital. This information is not intended to replace advice given to you by your health care provider. Make sure you discuss any questions you have with your health care  provider. Document Released: 05/05/2012 Document Revised: 04/11/2016 Document Reviewed: 01/07/2014 Elsevier Interactive Patient Education  2017 [employer]  

## 2018-03-03 ENCOUNTER — Other Ambulatory Visit: Payer: Self-pay

## 2018-03-03 ENCOUNTER — Ambulatory Visit (INDEPENDENT_AMBULATORY_CARE_PROVIDER_SITE_OTHER): Payer: Medicare Other | Admitting: Endocrinology

## 2018-03-03 ENCOUNTER — Encounter: Payer: Self-pay | Admitting: Endocrinology

## 2018-03-03 VITALS — BP 126/62 | HR 60 | Resp 16 | Wt 187.6 lb

## 2018-03-03 DIAGNOSIS — I2510 Atherosclerotic heart disease of native coronary artery without angina pectoris: Secondary | ICD-10-CM

## 2018-03-03 DIAGNOSIS — Z794 Long term (current) use of insulin: Secondary | ICD-10-CM

## 2018-03-03 DIAGNOSIS — N183 Chronic kidney disease, stage 3 (moderate): Secondary | ICD-10-CM

## 2018-03-03 DIAGNOSIS — E1122 Type 2 diabetes mellitus with diabetic chronic kidney disease: Secondary | ICD-10-CM | POA: Diagnosis not present

## 2018-03-03 MED ORDER — LORAZEPAM 0.5 MG PO TABS: 0.5000 mg | ORAL_TABLET | Freq: Every day | ORAL | 3 refills | Status: DC | PRN

## 2018-03-03 NOTE — Progress Notes (Signed)
Subjective:    Patient ID: Chad Mcmillan, male    DOB: 1924-04-16, 84 y.o.   MRN: [id number]  HPI  The state of at least three ongoing medical problems is addressed today, with interval history of each noted here: Pt returns for f/u of diabetes mellitus:  DM type: Insulin-requiring type 2 Dx'ed: 1989.   Complications: renal insuff, CAD, retinopathy, polyneuropathy, and CVA.   Therapy: insulin since 2004.  DKA: never.  Severe hypoglycemia: never.  Pancreatitis: never.  Other: he denies hypoglycemia.  He says cbg is in general higher as the day goes on.   Pt was recently in the hospital for pneumonia.  Denies sob.  This is a stable problem. He recently had MI.  He denies chest pain.  This is a stable problem. Anemia: denies BRBPR.  This is a stable problem.  Past Medical History:  Diagnosis Date  . ADENOCARCINOMA, PROSTATE 06/01/2008  . CORONARY ARTERY DISEASE 06/13/2007  . CVA 06/01/2008   x 2  . DIABETES MELLITUS, TYPE I, WITH OPHTHALMIC COMPLICATIONS 9/50/9326  . DM retinopathy ([place])   . HYPERKALEMIA 06/01/2008   due to DM neparopathy  . HYPERLIPIDEMIA 06/13/2007  . HYPERTENSION 06/13/2007  . NEOPLASM, MALIGNANT, SKIN, FACE 06/01/2008  . Other chronic nonalcoholic liver disease 05/30/4579  . RENAL DISEASE 06/01/2008    Past Surgical History:  Procedure Laterality Date  . Carotid Duplex  09/12/2004  . CATARACT EXTRACTION    . CHOLECYSTECTOMY    . ESOPHAGOGASTRODUODENOSCOPY  06/28/1991  . HERNIA REPAIR     right Inguinal hernia  . Rest/Stress Cardiolite  01/22/2001    Social History   Socioeconomic History  . Marital status: Married    Spouse name: Not on file  . Number of children: Not on file  . Years of education: Not on file  . Highest education level: Not on file  Occupational History  . Occupation: Retired    Fish farm manager: RETIRED  Social Needs  . Financial resource strain: Not on file  . Food insecurity:    Worry: Not on file    Inability: Not on file  .  Transportation needs:    Medical: Not on file    Non-medical: Not on file  Tobacco Use  . Smoking status: Former Smoker    Last attempt to quit: 11/18/1978    Years since quitting: 39.3  . Smokeless tobacco: Never Used  Substance and Sexual Activity  . Alcohol use: No  . Drug use: No  . Sexual activity: Never    Birth control/protection: None  Lifestyle  . Physical activity:    Days per week: Not on file    Minutes per session: Not on file  . Stress: Not on file  Relationships  . Social connections:    Talks on phone: Not on file    Gets together: Not on file    Attends religious service: Not on file    Active member of club or organization: Not on file    Attends meetings of clubs or organizations: Not on file    Relationship status: Not on file  . Intimate partner violence:    Fear of current or ex partner: Not on file    Emotionally abused: Not on file    Physically abused: Not on file    Forced sexual activity: Not on file  Other Topics Concern  . Not on file  Social History Narrative  . Not on file    Current Outpatient Medications on File Prior  to Visit  Medication Sig Dispense Refill  . amLODipine (NORVASC) 5 MG tablet Take 1 tablet (5 mg total) by mouth daily. 30 tablet 5  . aspirin EC 81 MG tablet Take 1 tablet (81 mg total) by mouth daily. 90 tablet 3  . atorvastatin (LIPITOR) 40 MG tablet Take 2 tablets (80 mg total) by mouth daily at 6 PM. 30 tablet 1  . carvedilol (COREG) 6.25 MG tablet Take 1 tablet (6.25 mg total) by mouth 2 (two) times daily with a meal. 60 tablet 1  . donepezil (ARICEPT) 5 MG tablet Take 1 tablet (5 mg total) by mouth at bedtime. 30 tablet 11  . ergocalciferol (VITAMIN D2) 50000 units capsule Take 1 capsule (50,000 Units total) by mouth 3 (three) times a week. 12 capsule 0  . ferrous sulfate 325 (65 FE) MG tablet Take 1 tablet (325 mg total) by mouth daily with breakfast. 30 tablet 3  . insulin lispro (HUMALOG) 100 UNIT/ML injection 3  times a day (just before each meal), 15-10-10 units 20 mL 1  . lisinopril (PRINIVIL,ZESTRIL) 20 MG tablet Take 1 tablet (20 mg total) by mouth daily. 30 tablet 1  . meclizine (ANTIVERT) 12.5 MG tablet TAKE 1 TABLET BY MOUTH THREE TIMES DAILY AS NEEDED FOR DIZZINESS 30 tablet 3  . nitroGLYCERIN (NITROSTAT) 0.4 MG SL tablet Place 1 tablet (0.4 mg total) under the tongue every 5 (five) minutes as needed. For chest pain 25 tablet 3  . vitamin B-12 1000 MCG tablet Take 1 tablet (1,000 mcg total) by mouth daily. 30 tablet 1   No current facility-administered medications on file prior to visit.     No Known Allergies  No family history on file.  BP 126/62   Pulse 60   Resp 16   Wt 187 lb 9 oz (85.1 kg)   SpO2 98%   BMI 24.08 kg/m    Review of Systems Denies hematuria and falls.      Objective:   Physical Exam Vital signs: see vs page.  Gen: elderly, frail, no distress.  In wheelchair.   LUNGS:  Clear to auscultation   Lab Results  Component Value Date   HGBA1C 6.6 (H) 02/28/2018   Lab Results  Component Value Date   CREATININE 1.82 (H) 03/02/2018   BUN 32 (H) 03/02/2018   NA 136 03/02/2018   K 4.3 03/02/2018   CL 107 03/02/2018   CO2 23 03/02/2018   Lab Results  Component Value Date   WBC 5.0 03/02/2018   HGB 9.1 (L) 03/02/2018   HCT 27.4 (L) 03/02/2018   MCV 96.1 03/02/2018   PLT 88 (L) 03/02/2018       Assessment & Plan:  Insulin-requiring type 2 DM: Frail elderly state: he is not a candidate for aggressive glycemic control Anxiety: xanax increases fall risk MI: clinically better Pneumonia: clinically better Renal failure: we'll follow Anemia: we'll follow.   Patient Instructions  Please stop taking the bedtime insulin. Please come back for a follow-up appointment in 1 month. Please reduce the lorazepam to once a day.   check your blood sugar twice a day.  vary the time of day when you check, between before the 3 meals, and at bedtime.  also check if you  have symptoms of your blood sugar being too high or too low.  please keep a record of the readings and bring it to your next appointment here (or you can bring the meter itself).  You can write it on  any piece of paper.  please call us sooner if your blood sugar goes below 70, or if you have a lot of readings over 200.

## 2018-03-03 NOTE — Patient Instructions (Addendum)
Please stop taking the bedtime insulin. Please come back for a follow-up appointment in 1 month. Please reduce the lorazepam to once a day.   check your blood sugar twice a day.  vary the time of day when you check, between before the 3 meals, and at bedtime.  also check if you have symptoms of your blood sugar being too high or too low.  please keep a record of the readings and bring it to your next appointment here (or you can bring the meter itself).  You can write it on any piece of paper.  please call us sooner if your blood sugar goes below 70, or if you have a lot of readings over 200.

## 2018-03-03 NOTE — Patient Outreach (Signed)
[facility]) Care Management  03/03/2018  JONNIE TRUXILLO 1923/12/03 [id number]   Assessment: 84 year old with recent discharge from skilled facility [facility]. History of HCAP, dehydration, Dementia, Diabetes, HTN, Failure to thrive. Client readmitted 4/12-4/15 with health care acquired pneumonia.   RNCM called for transition of care follow up and spoke with client's son, Camilo Mander. Two patient identifiers confirmed. Client's son reports client has been to see his primary care provider today and states that client's NPH insulin was stopped today. He reports client is doing better and that he has been in contact with the home health agency regarding restart of home health services.  Client's son is without questions or concerns at this time. He report client is feeling and doing better.   Plan: home visit next week.  Thea Silversmith, RN, MSN, [place] Coordinator Cell: [phone]

## 2018-03-04 LAB — CULTURE, BLOOD (ROUTINE X 2)
Culture: NO GROWTH
Culture: NO GROWTH
SPECIAL REQUESTS: ADEQUATE

## 2018-03-05 ENCOUNTER — Telehealth: Payer: Self-pay | Admitting: Endocrinology

## 2018-03-05 NOTE — Telephone Encounter (Signed)
Chad Mcmillan with [facility] ph# [phone] have been seeing patient since March-Patient was hospitalized for pnemonia. He is back and now he has pressure ulcer-skin tears- Please call Melissa at the above ph#

## 2018-03-05 NOTE — Telephone Encounter (Signed)
Ok to give verbal for nurse orders to resume since patient was discharged from hospital? [facility] [place] [facility] feels that patient still needs to be taught medication management, disease management as well as care for pressure ulcer. That are asking orders extent until the end of May?

## 2018-03-05 NOTE — Telephone Encounter (Signed)
I called and gave Melissa verbal to continue nursing for patient.

## 2018-03-05 NOTE — Telephone Encounter (Signed)
Ok, except he does not need disease management.

## 2018-03-10 ENCOUNTER — Telehealth: Payer: Self-pay | Admitting: Endocrinology

## 2018-03-10 ENCOUNTER — Other Ambulatory Visit: Payer: Self-pay

## 2018-03-10 NOTE — Telephone Encounter (Signed)
ok 

## 2018-03-10 NOTE — Patient Outreach (Addendum)
Chad Mcmillan) Care Management   03/10/2018  Chad Mcmillan Aug 09, 1924 [id number]  Chad Mcmillan is an 84 y.o. male  Subjective: client reports he is doing better since discharge from facility. He states he is still active with home health.  Objective: BP 120/60   Pulse 62   Resp 20   SpO2 95%   Review of Systems  Respiratory:       Lung sounds clear.  Cardiovascular:       S1S2 noted regular  Skin:       Skin turgor poor.   Physical Exam  Encounter Medications:   Outpatient Encounter Medications as of 03/10/2018  Medication Sig Note  . amLODipine (NORVASC) 5 MG tablet Take 1 tablet (5 mg total) by mouth daily.   Marland Kitchen aspirin EC 81 MG tablet Take 1 tablet (81 mg total) by mouth daily.   Marland Kitchen atorvastatin (LIPITOR) 40 MG tablet Take 2 tablets (80 mg total) by mouth daily at 6 PM.   . carvedilol (COREG) 6.25 MG tablet Take 1 tablet (6.25 mg total) by mouth 2 (two) times daily with a meal.   . donepezil (ARICEPT) 5 MG tablet Take 1 tablet (5 mg total) by mouth at bedtime.   . ergocalciferol (VITAMIN D2) 50000 units capsule Take 1 capsule (50,000 Units total) by mouth 3 (three) times a week. 02/27/2018: Monday, Wednesday, Friday   . ferrous sulfate 325 (65 FE) MG tablet Take 1 tablet (325 mg total) by mouth daily with breakfast.   . insulin lispro (HUMALOG) 100 UNIT/ML injection 3 times a day (just before each meal), 15-10-10 units   . lisinopril (PRINIVIL,ZESTRIL) 20 MG tablet Take 1 tablet (20 mg total) by mouth daily.   Marland Kitchen LORazepam (ATIVAN) 0.5 MG tablet Take 1 tablet (0.5 mg total) by mouth daily as needed for anxiety.   . meclizine (ANTIVERT) 12.5 MG tablet TAKE 1 TABLET BY MOUTH THREE TIMES DAILY AS NEEDED FOR DIZZINESS   . nitroGLYCERIN (NITROSTAT) 0.4 MG SL tablet Place 1 tablet (0.4 mg total) under the tongue every 5 (five) minutes as needed. For chest pain 02/27/2018: Pt has not had to use in last 30 days  . vitamin B-12 1000 MCG tablet Take 1 tablet (1,000 mcg total)  by mouth daily.    No facility-administered encounter medications on file as of 03/10/2018.     Functional Status Survey: Is the patient deaf or have difficulty hearing?: Yes Does the patient have difficulty seeing, even when wearing glasses/contacts?: No Does the patient have difficulty concentrating, remembering, or making decisions?: No(I dont think so, if I have to make big decisions I get my family to help me.) Does the patient have difficulty walking or climbing stairs?: Yes Does the patient have difficulty dressing or bathing?: No Does the patient have difficulty doing errands alone such as visiting a doctor's office or shopping?: No     Fall Risk  03/10/2018  Falls in the past year? Yes  Number falls in past yr: 2 or more  Injury with Fall? No  Risk Factor Category  High Fall Risk  Risk for fall due to : History of fall(s);Impaired balance/gait   Depression screen [facility] 2/9 03/10/2018  Decreased Interest 0  Down, Depressed, Hopeless 0  PHQ - 2 Score 0    Assessment:  84 year old with recent discharge from skilled facility [facility]. History of HCAP, dehydration, Dementia, Diabetes, HTN, Failure to thrive. Client ambulates with walker and continues to be active with home  health.  Client is alert, oriented. Lives with wife. Per client Chad Mcmillan has dementia. He states he has supportive sons who help him manage the household.   Medications reviewed. Client's sons assist him with managing his medications. No issues/concerns noted.  Client reports at one time he had some confusion when he had the pneumonia, but states he is very clear now.  Client dislodged a scab on his left hand during home visit and small band aid to area. Another scab area noted to left had intact.  RNCM reviewed client's meter and 7 day average-254 n=5, 14 day average n=8. A number of his blood sugars were taken after eating. RNCM reviewed after visit summary instructions from primary care office visit on  03/03/18 with client.  Plan: telephonic phone call next week.  THN CM Care Plan Problem One     Most Recent Value  Care Plan Problem One  Risk for hospital re-admission related to/ as evidenced by multiple recent hospitalizations for acute metabolic encephalopathy with discharge to SNF for rehabilitation  Role Documenting the Problem One  Care Management [place] for Problem One  Active  [facility] Long Term Goal   client will not be readmitted within the next 31 days.  THN Long Term Goal Start Date  03/03/18  Interventions for Problem One Long Term Goal  RNCM reviewed medications, reviewed after visit summary from primary care office visit, provided [facility] calender/organizer and encouraged client to use the 24 hour nurse advice line  THN CM Short Term Goal #1   client/family will call RNCM and/or 24 hour nurse advice line with questions as needed.  THN CM Short Term Goal #1 Start Date  03/03/18  Interventions for Short Term Goal #1  reinforced and encouraged client to call as needed, confirmed client has the contact number.  THN CM Short Term Goal #2   client/family will verbalize at least 3 fall prevention strategies within the next 30 days.  THN CM Short Term Goal #2 Start Date  03/03/18  Interventions for Short Term Goal #2  encouraged client to use walker at all times, reinforced the importance of following instruction and completing exercises from physical therapist.     Chad Silversmith, RN, MSN, [place] Coordinator Cell: [phone]

## 2018-03-10 NOTE — Telephone Encounter (Signed)
Is it ok to give verbals for wound care for patient?

## 2018-03-10 NOTE — Telephone Encounter (Signed)
[facility] about getting [facility] for [facility] .  Please advise  April.  [phone]

## 2018-03-11 NOTE — Telephone Encounter (Signed)
LVM for April that I was calling to give verbal for patient's wound care.

## 2018-03-13 ENCOUNTER — Other Ambulatory Visit: Payer: Self-pay | Admitting: Endocrinology

## 2018-03-17 ENCOUNTER — Other Ambulatory Visit: Payer: Self-pay

## 2018-03-17 NOTE — Patient Outreach (Signed)
[facility]) Care Management  03/17/2018  Chad Mcmillan [birth year] [id number]   84 year old with recent discharge from skilled facility [facility]. History of HCAP, dehydration, Dementia, Diabetes, HTN, Failure to thrive. Client ambulates with walker and continues to be active with home health.  RNCM called to follow up regarding transition of care. Client is hesitant to give birthday and patient identifier information over the phone, but did confirm date of birth and address. Client is agreeable to another home visit.   Client denies any questions or concerns.  Plan: home visit next week.  Thea Silversmith, RN, MSN, [place] Coordinator Cell: [phone]

## 2018-03-24 ENCOUNTER — Other Ambulatory Visit: Payer: Self-pay

## 2018-03-24 NOTE — Patient Outreach (Signed)
[facility]) Care Management   03/24/2018  Chad Mcmillan 10/22/1924 [id number]  Chad Mcmillan is an 84 y.o. male  Subjective: client reports he is doing well.  Objective:BP (!) 142/60   Pulse (!) 58   Resp 20   SpO2 (!) 58%     Review of Systems  Respiratory: Negative.   Cardiovascular: Negative.   Skin: Negative.        Skin turgor poor, fading old bruises noted to upper right arm.    Physical Exam  Encounter Medications:   Outpatient Encounter Medications as of 03/24/2018  Medication Sig Note  . amLODipine (NORVASC) 5 MG tablet Take 1 tablet (5 mg total) by mouth daily.   Marland Kitchen aspirin EC 81 MG tablet Take 1 tablet (81 mg total) by mouth daily.   Marland Kitchen atorvastatin (LIPITOR) 40 MG tablet Take 2 tablets (80 mg total) by mouth daily at 6 PM.   . carvedilol (COREG) 6.25 MG tablet Take 1 tablet (6.25 mg total) by mouth 2 (two) times daily with a meal.   . donepezil (ARICEPT) 5 MG tablet Take 1 tablet (5 mg total) by mouth at bedtime.   . ferrous sulfate 325 (65 FE) MG tablet Take 1 tablet (325 mg total) by mouth daily with breakfast.   . insulin lispro (HUMALOG) 100 UNIT/ML injection 3 times a day (just before each meal), 15-10-10 units 03/10/2018: Reports takes 12 units before breakfast, 10 units before lunch and 10 units before supper.  Marland Kitchen lisinopril (PRINIVIL,ZESTRIL) 20 MG tablet Take 1 tablet (20 mg total) by mouth daily.   Marland Kitchen LORazepam (ATIVAN) 0.5 MG tablet Take 1 tablet (0.5 mg total) by mouth daily as needed for anxiety. 03/10/2018: Reports takes twice a day as needed. Bottle states twice a day as needed.  . meclizine (ANTIVERT) 12.5 MG tablet TAKE 1 TABLET BY MOUTH THREE TIMES DAILY AS NEEDED FOR DIZZINESS   . nitroGLYCERIN (NITROSTAT) 0.4 MG SL tablet Place 1 tablet (0.4 mg total) under the tongue every 5 (five) minutes as needed. For chest pain 02/27/2018: Pt has not had to use in last 30 days  . vitamin B-12 1000 MCG tablet Take 1 tablet (1,000 mcg total) by mouth daily.    . Vitamin D, Ergocalciferol, (DRISDOL) 50000 units CAPS capsule TAKE 1 CAPSULE BY MOUTH 3 TIMES A WEEK    No facility-administered encounter medications on file as of 03/24/2018.     Functional Status:   In your present state of health, do you have any difficulty performing the following activities: 03/10/2018 02/27/2018  Hearing? Tempie Donning  Vision? N N  Difficulty concentrating or making decisions? N Y  Comment I dont think so, if I have to make big decisions I get my family to help me. -  Walking or climbing stairs? Y Y  Dressing or bathing? N N  Doing errands, shopping? N Y  Conservation officer, nature and eating ? N -  Using the Toilet? N -  In the past six months, have you accidently leaked urine? N -  Do you have problems with loss of bowel control? N -  Managing your Medications? N -  Comment family helps client manage -  Managing your Finances? N -  Housekeeping or managing your Housekeeping? N -  Comment family helps -  Some recent data might be hidden    Fall/Depression Screening:    Fall Risk  03/10/2018  Falls in the past year? Yes  Number falls in past yr: 2 or  more  Injury with Fall? No  Risk Factor Category  High Fall Risk  Risk for fall due to : History of fall(s);Impaired balance/gait   PHQ 2/9 Scores 03/10/2018  PHQ - 2 Score 0    Assessment:  84 year old with recent discharge from skilled facility [facility]. History of HCAP, dehydration, Dementia, Diabetes, HTN, Failure to thrive.Client ambulates with walker and continues to be active with home health. Present during visit was client's wife.  RNCM completed home visit. Client denies any questions or concern. He states home health physical therapy continues to make home visit. Client states he feels this is helping.  No questions or concerns noted.   Plan: telephonic follow up next week. THN CM Care Plan Problem One     Most Recent Value  Care Plan Problem One  Risk for hospital re-admission related to/ as evidenced by  multiple recent hospitalizations for acute metabolic encephalopathy with discharge to SNF for rehabilitation  Role Documenting the Problem One  Care Management [place] for Problem One  Active  [facility] Long Term Goal   client will not be readmitted within the next 31 days.  THN Long Term Goal Start Date  03/03/18  [facility] CM Short Term Goal #1   client/family will call RNCM and/or 24 hour nurse advice line with questions as needed.  THN CM Short Term Goal #1 Start Date  03/03/18  THN CM Short Term Goal #2   client/family will verbalize at least 3 fall prevention strategies within the next 30 days.  THN CM Short Term Goal #2 Start Date  03/03/18      Thea Silversmith, RN, MSN, [place] Coordinator Cell: [phone]

## 2018-04-02 ENCOUNTER — Other Ambulatory Visit: Payer: Self-pay

## 2018-04-02 NOTE — Patient Outreach (Signed)
[facility]) Care Management  04/02/2018  Chad Mcmillan 12-22-1923 [id number]   84 year old with recent discharge from skilled facility [facility]. History of HCAP, dehydration, Dementia, Diabetes, HTN, Failure to thrive.  RNCM called x2 to follow up regarding transition of care. However, client would not confirm two patient identifiers and each time hung up on RNCM. RNCM called client's son Chad Mcmillan (on consent). HIPPA complient Voice message left.   Plan: send outreach letter and continue to attempt to reach client.  Thea Silversmith, RN, MSN, [place] Coordinator Cell: [phone]

## 2018-04-03 ENCOUNTER — Other Ambulatory Visit: Payer: Self-pay

## 2018-04-03 ENCOUNTER — Telehealth: Payer: Self-pay | Admitting: Endocrinology

## 2018-04-03 MED ORDER — ATORVASTATIN CALCIUM 40 MG PO TABS: 80.0000 mg | ORAL_TABLET | Freq: Every day | ORAL | 4 refills | Status: DC

## 2018-04-03 NOTE — Telephone Encounter (Signed)
I spoke with [name] & he stated that according to pharmacy all was needed was a refill not a PA. I have sent to pharmacy for 60 tablets & 4 refills.Chad Mcmillan

## 2018-04-03 NOTE — Telephone Encounter (Signed)
I believe that it was patient's son Louie Casa who initially called not patient. I LVM for Louie Casa to call back, but I explained from Dr. Cordelia Pen note below that it is cheaper to buy prescription with out insurance.

## 2018-04-03 NOTE — Telephone Encounter (Signed)
Patient need a PA on medication atorvastatin (LIPITOR) 40 MG tablet [[id number]]  Send to [place], [address]  He need enough to last him for a month

## 2018-04-03 NOTE — Telephone Encounter (Signed)
I have not yet received this paperwork, but would you like me to complete or switch?

## 2018-04-03 NOTE — Telephone Encounter (Signed)
What is alternative? Also, at [facility], it is $9 for 1 month, or $24 for 3 months.

## 2018-04-03 NOTE — Telephone Encounter (Signed)
Patient son is returning call. Please call  Call [name] [phone]

## 2018-04-06 ENCOUNTER — Other Ambulatory Visit: Payer: Self-pay

## 2018-04-06 NOTE — Patient Outreach (Signed)
[facility]) Care Management  04/06/2018  JAQUEZ FARRINGTON [birth year] [id number]  84 year old with recent discharge from skilled facility [facility]. History of HCAP, dehydration, Dementia, Diabetes, HTN, Failure to thrive.Client ambulates with walker and continues to be active with home health.  RNCM called to follow up. Person answering phone was client's son Louie Casa Rion(on the consent). Mr. Moses Odoherty confirmed two patient identifiers, but was very suspicious and stated that someone had been calling and asking for client's social security number, then asked if writer was the one asking for client's social security number. RNCM attempted to allay Mr. Louie Casa Polyakov's fear by answering all his questions regarding to the nature of the call and who signed client up for the program. He would not discuss anything about his father. RNCM reinforced that his brother [name] signed client up for the Care management program. Mrs. Keondre Markson request RNCM call his brother [name] to follow up.  RNCM called and spoke with client's son Mirl Hillery (on the consent). Mr. Isack Lavalley confirmed two patient identifiers.  RNCM informed brother Louie Casa suspicious of caller and did not want to discuss client.   Mr. Bleu Minerd states that client is doing well. He states he has been to see his primary care provider, Dr. Loanne Drilling, who also manages his diabetes. He states home health agency is still involved, [facility] for both nursing and physical therapy. He states client is that he is able to get up and down out of chair by himself and walking better with the walker.  He denies any issues or questions at this time. Mr. Xxavier Noon states that either he or his brother is at client's house every day.   RNCM encouraged Mr. Demontray Franta to call as needed  Plan: RNCM will follow up with Mr. Casin Federici next month telephonically regarding any additional care management needs.   Thea Silversmith, RN, MSN, [place]  Coordinator Cell: [phone]

## 2018-04-13 ENCOUNTER — Other Ambulatory Visit: Payer: Self-pay | Admitting: Endocrinology

## 2018-04-15 ENCOUNTER — Ambulatory Visit (INDEPENDENT_AMBULATORY_CARE_PROVIDER_SITE_OTHER): Payer: Medicare Other | Admitting: Endocrinology

## 2018-04-15 ENCOUNTER — Telehealth: Payer: Self-pay | Admitting: Internal Medicine

## 2018-04-15 ENCOUNTER — Encounter: Payer: Self-pay | Admitting: Endocrinology

## 2018-04-15 VITALS — BP 146/68 | HR 96 | Wt 176.6 lb

## 2018-04-15 DIAGNOSIS — Z794 Long term (current) use of insulin: Secondary | ICD-10-CM | POA: Diagnosis not present

## 2018-04-15 DIAGNOSIS — E1122 Type 2 diabetes mellitus with diabetic chronic kidney disease: Secondary | ICD-10-CM

## 2018-04-15 DIAGNOSIS — Z9119 Patient's noncompliance with other medical treatment and regimen: Secondary | ICD-10-CM | POA: Diagnosis not present

## 2018-04-15 DIAGNOSIS — N179 Acute kidney failure, unspecified: Secondary | ICD-10-CM

## 2018-04-15 DIAGNOSIS — N189 Chronic kidney disease, unspecified: Secondary | ICD-10-CM | POA: Diagnosis not present

## 2018-04-15 DIAGNOSIS — I2510 Atherosclerotic heart disease of native coronary artery without angina pectoris: Secondary | ICD-10-CM | POA: Diagnosis not present

## 2018-04-15 DIAGNOSIS — I10 Essential (primary) hypertension: Secondary | ICD-10-CM

## 2018-04-15 DIAGNOSIS — N183 Chronic kidney disease, stage 3 (moderate): Principal | ICD-10-CM

## 2018-04-15 DIAGNOSIS — R197 Diarrhea, unspecified: Secondary | ICD-10-CM | POA: Diagnosis not present

## 2018-04-15 DIAGNOSIS — D649 Anemia, unspecified: Secondary | ICD-10-CM | POA: Diagnosis not present

## 2018-04-15 LAB — CBC WITH DIFFERENTIAL/PLATELET
Basophils Absolute: 0 10*3/uL (ref 0.0–0.1)
Basophils Relative: 0.3 % (ref 0.0–3.0)
EOS PCT: 1.3 % (ref 0.0–5.0)
Eosinophils Absolute: 0.1 10*3/uL (ref 0.0–0.7)
HCT: 32.4 % — ABNORMAL LOW (ref 39.0–52.0)
Hemoglobin: 10.8 g/dL — ABNORMAL LOW (ref 13.0–17.0)
LYMPHS ABS: 3.1 10*3/uL (ref 0.7–4.0)
Lymphocytes Relative: 35.3 % (ref 12.0–46.0)
MCHC: 33.3 g/dL (ref 30.0–36.0)
MCV: 95.6 fl (ref 78.0–100.0)
MONOS PCT: 6.6 % (ref 3.0–12.0)
Monocytes Absolute: 0.6 10*3/uL (ref 0.1–1.0)
NEUTROS ABS: 5 10*3/uL (ref 1.4–7.7)
NEUTROS PCT: 56.5 % (ref 43.0–77.0)
Platelets: 156 10*3/uL (ref 150.0–400.0)
RBC: 3.39 Mil/uL — AB (ref 4.22–5.81)
RDW: 14.6 % (ref 11.5–15.5)
WBC: 8.8 10*3/uL (ref 4.0–10.5)

## 2018-04-15 LAB — BASIC METABOLIC PANEL
BUN: 43 mg/dL — ABNORMAL HIGH (ref 6–23)
CO2: 19 mEq/L (ref 19–32)
Calcium: 9.2 mg/dL (ref 8.4–10.5)
Chloride: 113 mEq/L — ABNORMAL HIGH (ref 96–112)
Creatinine, Ser: 2.29 mg/dL — ABNORMAL HIGH (ref 0.40–1.50)
GFR: 28.55 mL/min — AB (ref >60.00)
GLUCOSE: 22 mg/dL — AB (ref 70–99)
POTASSIUM: 5.6 meq/L — AB (ref 3.5–5.1)
SODIUM: 138 meq/L (ref 135–145)

## 2018-04-15 LAB — IBC PANEL
Iron: 91 ug/dL (ref 42–165)
Saturation Ratios: 29.5 % (ref 20.0–50.0)
Transferrin: 220 mg/dL (ref 212.0–360.0)

## 2018-04-15 LAB — POCT GLYCOSYLATED HEMOGLOBIN (HGB A1C): Hemoglobin A1C: 6.4 % — AB (ref 4.0–5.6)

## 2018-04-15 MED ORDER — INSULIN LISPRO 100 UNIT/ML ~~LOC~~ SOLN: SUBCUTANEOUS | 1 refills | Status: DC

## 2018-04-15 MED ORDER — METRONIDAZOLE 250 MG PO TABS: 250.0000 mg | ORAL_TABLET | Freq: Two times a day (BID) | ORAL | 0 refills | Status: DC

## 2018-04-15 MED ORDER — LISINOPRIL 10 MG PO TABS: 10.0000 mg | ORAL_TABLET | Freq: Every day | ORAL | 11 refills | Status: DC

## 2018-04-15 NOTE — Telephone Encounter (Signed)
Called  At 5:30 pm with critical serum glucose of < 30.  Note from today's visit with Dr Loanne Drilling   reviewed. Patient was asleep when I called. ,  Spoke with son (also named [name]) . Apparently after his visit today  with Dr Loanne Drilling,  Patient  became unresponsive and CBG was 27.  EMS called ,  Iv fluids given,   Glucose given . patient revived.   Patient ate 2 cheeseburgers and has been sleeping for the rest of the afternoon.   No explanation for low BS occurring today but appears to be having diarrhea as well and CR is still pending .  Son very concerned because his understanding of diabetes management is poor.  I have Revised the  insulin sliding scale even more given these events as follows:  No insulin tonight unless BS is > 200. ,  Then give 5 units  .  Check BS now and again at bedtime.   In the morning:  Give 10 units before breakfast for BS > 200 if eating.  None if not eating,  None if BS < 200 Give 5 units before lunch and dinner for BS> 200  Advised son to call back tonight if any issues,  Contact Dr Cordelia Pen office in the am

## 2018-04-15 NOTE — Progress Notes (Signed)
Subjective:    Patient ID: Chad Mcmillan, male    DOB: 24-Jun-1924, 84 y.o.   MRN: [id number]  HPI The state of at least three ongoing medical problems is addressed today, with interval history of each noted here: Pt returns for f/u of diabetes mellitus:  DM type: Insulin-requiring type 2 Dx'ed: 1989.   Complications: renal insuff, CAD, retinopathy, polyneuropathy, and CVA.   Therapy: insulin since 2004.  DKA: never.  Severe hypoglycemia: never.  Pancreatitis: never.  Other: no cbg record, but son states cbg's are lowest fasting Pt was recently in the hospital for pneumonia.  Denies sob.  This is a stable problem. He has diarrhea x 1 month.   Past Medical History:  Diagnosis Date  . ADENOCARCINOMA, PROSTATE 06/01/2008  . CORONARY ARTERY DISEASE 06/13/2007  . CVA 06/01/2008   x 2  . DIABETES MELLITUS, TYPE I, WITH OPHTHALMIC COMPLICATIONS 02/18/4741  . DM retinopathy ([place])   . HYPERKALEMIA 06/01/2008   due to DM neparopathy  . HYPERLIPIDEMIA 06/13/2007  . HYPERTENSION 06/13/2007  . NEOPLASM, MALIGNANT, SKIN, FACE 06/01/2008  . Other chronic nonalcoholic liver disease 5/95/6387  . RENAL DISEASE 06/01/2008    Past Surgical History:  Procedure Laterality Date  . Carotid Duplex  09/12/2004  . CATARACT EXTRACTION    . CHOLECYSTECTOMY    . ESOPHAGOGASTRODUODENOSCOPY  06/28/1991  . HERNIA REPAIR     right Inguinal hernia  . Rest/Stress Cardiolite  01/22/2001    Social History   Socioeconomic History  . Marital status: Married    Spouse name: Not on file  . Number of children: Not on file  . Years of education: Not on file  . Highest education level: Not on file  Occupational History  . Occupation: Retired    Fish farm manager: RETIRED  Social Needs  . Financial resource strain: Not on file  . Food insecurity:    Worry: Not on file    Inability: Not on file  . Transportation needs:    Medical: Not on file    Non-medical: Not on file  Tobacco Use  . Smoking status: Former Smoker      Last attempt to quit: 11/18/1978    Years since quitting: 39.4  . Smokeless tobacco: Never Used  Substance and Sexual Activity  . Alcohol use: No  . Drug use: No  . Sexual activity: Never    Birth control/protection: None  Lifestyle  . Physical activity:    Days per week: Not on file    Minutes per session: Not on file  . Stress: Not on file  Relationships  . Social connections:    Talks on phone: Not on file    Gets together: Not on file    Attends religious service: Not on file    Active member of club or organization: Not on file    Attends meetings of clubs or organizations: Not on file    Relationship status: Not on file  . Intimate partner violence:    Fear of current or ex partner: Not on file    Emotionally abused: Not on file    Physically abused: Not on file    Forced sexual activity: Not on file  Other Topics Concern  . Not on file  Social History Narrative  . Not on file    Current Outpatient Medications on File Prior to Visit  Medication Sig Dispense Refill  . amLODipine (NORVASC) 5 MG tablet Take 1 tablet (5 mg total) by mouth daily. 30 tablet  5  . aspirin EC 81 MG tablet Take 1 tablet (81 mg total) by mouth daily. 90 tablet 3  . atorvastatin (LIPITOR) 40 MG tablet Take 2 tablets (80 mg total) by mouth daily at 6 PM. 60 tablet 4  . carvedilol (COREG) 6.25 MG tablet Take 1 tablet (6.25 mg total) by mouth 2 (two) times daily with a meal. 60 tablet 1  . donepezil (ARICEPT) 5 MG tablet Take 1 tablet (5 mg total) by mouth at bedtime. 30 tablet 11  . ferrous sulfate 325 (65 FE) MG tablet Take 1 tablet (325 mg total) by mouth daily with breakfast. 30 tablet 3  . LORazepam (ATIVAN) 0.5 MG tablet Take 1 tablet (0.5 mg total) by mouth daily as needed for anxiety. 30 tablet 3  . meclizine (ANTIVERT) 12.5 MG tablet TAKE 1 TABLET BY MOUTH THREE TIMES DAILY AS NEEDED FOR DIZZINESS 30 tablet 3  . nitroGLYCERIN (NITROSTAT) 0.4 MG SL tablet Place 1 tablet (0.4 mg total) under  the tongue every 5 (five) minutes as needed. For chest pain 25 tablet 3  . vitamin B-12 1000 MCG tablet Take 1 tablet (1,000 mcg total) by mouth daily. 30 tablet 1   No current facility-administered medications on file prior to visit.     No Known Allergies  No family history on file.  BP (!) 146/68   Pulse 96   Wt 176 lb 9.6 oz (80.1 kg)   SpO2 99%   BMI 22.67 kg/m   Review of Systems He has lost 13 lbs since last ov.  He denies hypoglycemia.     Objective:   Physical Exam VITAL SIGNS:  See vs page.  GENERAL: no distress.  Pulses: foot pulses are intact bilaterally.   MSK: no deformity of the feet or ankles.  CV: 1+ bilat edema of the legs, and bilat vv's.  Skin:  no ulcer on the feet or ankles.  normal color and temp on the feet and ankles.  Neuro: sensation is intact to touch on the feet and ankles, but decreased from normal.  Ext: both great toenails are absent.   Lab Results  Component Value Date   HGBA1C 6.4 (A) 04/15/2018       Assessment & Plan:  Insulin-requiring type 2 DM: overcontrolled.  Noncompliance with cbg recording: in this setting,  Diarrhea: give recent hosp stay and frail elderly state, empiric rx for c diff is the only available option. HTN: well-controlled.     Patient Instructions  Please reduce humalog to 3 times a day (just before each meal), 12-8-8 units.   I have sent a prescription to your pharmacy, for an antibiotic against the diarrhea. Please continue the same medication for the blood pressure.  blood tests are requested for you today.  We'll let you know about the results.  check your blood sugar twice a day.  vary the time of day when you check, between before the 3 meals, and at bedtime.  also check if you have symptoms of your blood sugar being too high or too low.  please keep a record of the readings and bring it to your next appointment here (or you can bring the meter itself).  You can write it on any piece of paper.  please call  us sooner if your blood sugar goes below 70, or if you have a lot of readings over 200.   Please come back for a follow-up appointment in 2 months.

## 2018-04-15 NOTE — Patient Instructions (Addendum)
Please reduce humalog to 3 times a day (just before each meal), 12-8-8 units.   I have sent a prescription to your pharmacy, for an antibiotic against the diarrhea. Please continue the same medication for the blood pressure.  blood tests are requested for you today.  We'll let you know about the results.  check your blood sugar twice a day.  vary the time of day when you check, between before the 3 meals, and at bedtime.  also check if you have symptoms of your blood sugar being too high or too low.  please keep a record of the readings and bring it to your next appointment here (or you can bring the meter itself).  You can write it on any piece of paper.  please call us sooner if your blood sugar goes below 70, or if you have a lot of readings over 200.   Please come back for a follow-up appointment in 2 months.

## 2018-04-16 ENCOUNTER — Telehealth: Payer: Self-pay | Admitting: Endocrinology

## 2018-04-16 NOTE — Telephone Encounter (Signed)
Per [facility] calling, reporting critical lab results. Ph# to Lab [phone]

## 2018-04-16 NOTE — Telephone Encounter (Signed)
Yes, he was told to continue the same yesterday.  However, then blood test results became available: here is the result note:  Notes recorded by Cresenciano Lick, CMA on 04/16/2018 at 11:19 AM EDT Pt's son, [name] informed. ------  Notes recorded by [name], MD on 04/15/2018 at 6:23 PM EDT please call patient: Blood sugar is low. Please reduce the insulin as we discussed. Potassium is high. Please reduce the lisinopril to 10 mg daily.  Anemia is much better-good.

## 2018-04-16 NOTE — Telephone Encounter (Signed)
Please advise. You note states continue same dose for blood pressure med, but you did change the dose.

## 2018-04-16 NOTE — Telephone Encounter (Signed)
lisinopril (PRINIVIL,ZESTRIL) 10 MG tablet  Patients son stated that patient usually takes 93m and they have notice this medication has been reduced to 120m They wanted to clarify that this was accurate and if the dr was wanting him to take lower dosage   Please advise

## 2018-04-16 NOTE — Telephone Encounter (Signed)
See 04/15/18 result note. I spoke to pt's son regarding his labs.

## 2018-04-17 ENCOUNTER — Telehealth: Payer: Self-pay | Admitting: Endocrinology

## 2018-04-17 NOTE — Telephone Encounter (Signed)
Per Dr. Loanne Drilling- Pt's son, Louie Casa informed to check pt's Cbgs twice daily.  Dr. Loanne Drilling wants pt to continue his insulin three times daily with meals at 10-5-5 units and call us on Monday 04/20/18 with CBG readings and how patient is doing. I advised son, Louie Casa to call EMS  If he has any further issues over the weekend.

## 2018-04-17 NOTE — Telephone Encounter (Signed)
Patients son is calling back in regards to medication dosage. He has not heard from anyone and wanted to see what was going on   Please advise

## 2018-04-18 ENCOUNTER — Other Ambulatory Visit: Payer: Self-pay | Admitting: Endocrinology

## 2018-04-20 NOTE — Telephone Encounter (Signed)
Patient's son, Louie Casa called to report CBGs readings : He states his father has still been acting the same, no energy. He has been giving him 5 units. Please advise.  04/19/18 9:36 150mg               12:00pm  291mg     5:45  315mg    8:00pm 229mg   04/20/18 7:36am 156mg   12:37pm  200mg 

## 2018-04-20 NOTE — Telephone Encounter (Signed)
Please verify insulin is 3 times a day (just before each meal) 10-5-5 units. Then please increase to 3 times a day (just before each meal), 12-6-6 units. Please call or message [place] in a few days, to tell us how the blood sugar is doing

## 2018-04-21 NOTE — Telephone Encounter (Signed)
LVM with patient's son Chad Mcmillan that his dad should always take insulin regardless of blood sugar. I asked that he call back to verify he received message.

## 2018-04-21 NOTE — Telephone Encounter (Signed)
As high as cbg's have been, please take no matter what cbg is.

## 2018-04-21 NOTE — Telephone Encounter (Signed)
Leeroy Bock (son) the advice from the note below and he stated an understanding- Louie Casa wants to know if the patient should be taking the insulin 3 times a day before each meal regardless of his readings because he was told that if his blood sugar was under 200 he should not take the insulin before his meal please advise

## 2018-04-21 NOTE — Telephone Encounter (Signed)
Pt's son is aware

## 2018-05-04 ENCOUNTER — Other Ambulatory Visit: Payer: Self-pay

## 2018-05-04 NOTE — Patient Outreach (Signed)
[facility]) Care Management  05/04/2018  VARICK KEYS [birth year] [id number]  84 year old with history of HCAP, dehydration, Dementia, Diabetes, HTN, Failure to thrive. Recent stay at [facility] skilled facility following hospital admission 02/27/18-03/02/18 for healthcare acquired pneumonia.  RNCM called to assess for any additional care management needs. Called placed to client's son, as client has dementia. RNCM has been following up with Rogelia Rohrer on consent. No answer. HIPPA compliant message left.  Plan: send outreach letter. Follow up in 3-4 business days.  Thea Silversmith, RN, MSN, [place] Coordinator Cell: [phone]

## 2018-05-08 ENCOUNTER — Other Ambulatory Visit: Payer: Self-pay

## 2018-05-08 NOTE — Patient Outreach (Signed)
[facility]) Care Management  05/08/2018  Chad Mcmillan 1924-04-10 [id number]   84 year old with history of HCAP, dehydration, Dementia, Diabetes, HTN, Failure to thrive. Recent stay at [facility] skilled facility following hospital admission 02/27/18-03/02/18 for healthcare acquired pneumonia.  RNCM called to assess for any additional care management needs. Called placed to client's son, as client has dementia. RNCM has been following up with Chad Mcmillan on consent. No answer. HIPPA compliant message left.   Plan: follow up in 3-4 business days if no response.  Thea Silversmith, RN, MSN, [place] Coordinator Cell: [phone]

## 2018-05-11 ENCOUNTER — Other Ambulatory Visit: Payer: Self-pay

## 2018-05-11 MED ORDER — CARVEDILOL 6.25 MG PO TABS: 6.2500 mg | ORAL_TABLET | Freq: Two times a day (BID) | ORAL | 1 refills | Status: DC

## 2018-05-11 NOTE — Patient Outreach (Signed)
[facility]) Care Management  05/11/2018  Chad Mcmillan September 07, 1924 [id number]   84 year old with history of HCAP, dehydration, Dementia, Diabetes, HTN, Failure to thrive. Recent stay at [facility] skilled facility following hospital admission 02/27/18-03/02/18 for healthcare acquired pneumonia.  RNCM called to assess for any additional care management needs. Called placed to client's son, as client has dementia. RNCM has been following up with Chad Mcmillan on consent. No answer. HIPPA compliant message left. 3rd outreach call. Outreach letter sent on 05/04/18.  Plan: close case 10 days after outreach letter sent, if no response from outreach attempts.  Thea Silversmith, RN, MSN, [place] Coordinator Cell: [phone]

## 2018-05-12 ENCOUNTER — Other Ambulatory Visit: Payer: Self-pay

## 2018-05-12 MED ORDER — CARVEDILOL 6.25 MG PO TABS: 6.2500 mg | ORAL_TABLET | Freq: Two times a day (BID) | ORAL | 1 refills | Status: DC

## 2018-05-15 ENCOUNTER — Other Ambulatory Visit: Payer: Self-pay

## 2018-05-15 NOTE — Patient Outreach (Signed)
[facility]) Care Management  05/15/2018  Chad Mcmillan Sep 18, 1924 [id number]   Case Closure: no response from telephone attempts. No response from outreach letter.  Plan: close case.  Thea Silversmith, RN, MSN, [place] Coordinator Cell: [phone]

## 2018-06-15 ENCOUNTER — Ambulatory Visit (INDEPENDENT_AMBULATORY_CARE_PROVIDER_SITE_OTHER): Payer: Medicare Other | Admitting: Endocrinology

## 2018-06-15 ENCOUNTER — Encounter: Payer: Self-pay | Admitting: Endocrinology

## 2018-06-15 VITALS — BP 128/68 | HR 60 | Ht 72.0 in | Wt 187.4 lb

## 2018-06-15 DIAGNOSIS — Z794 Long term (current) use of insulin: Secondary | ICD-10-CM | POA: Diagnosis not present

## 2018-06-15 DIAGNOSIS — N183 Chronic kidney disease, stage 3 unspecified: Secondary | ICD-10-CM

## 2018-06-15 DIAGNOSIS — I2510 Atherosclerotic heart disease of native coronary artery without angina pectoris: Secondary | ICD-10-CM

## 2018-06-15 DIAGNOSIS — E1122 Type 2 diabetes mellitus with diabetic chronic kidney disease: Secondary | ICD-10-CM | POA: Diagnosis not present

## 2018-06-15 MED ORDER — LISINOPRIL 5 MG PO TABS: 5.0000 mg | ORAL_TABLET | Freq: Every day | ORAL | 3 refills | Status: DC

## 2018-06-15 MED ORDER — REPAGLINIDE 0.5 MG PO TABS: 0.5000 mg | ORAL_TABLET | Freq: Three times a day (TID) | ORAL | 3 refills | Status: DC

## 2018-06-15 NOTE — Progress Notes (Signed)
Subjective:    Patient ID: Chad Mcmillan, male    DOB: [birth year], 84 y.o.   MRN: [id number]  HPI The state of at least three ongoing medical problems is addressed today, with interval history of each noted here: Pt returns for f/u of diabetes mellitus:  DM type: Insulin-requiring type 2 Dx'ed: 1989.   Complications: renal insuff, CAD, retinopathy, polyneuropathy, and CVA.   Therapy: insulin since 2004.  DKA: never.  Severe hypoglycemia: never.  Pancreatitis: never.   Other: he takes multiple daily injections.   Interval hx: Meter is downloaded today, and the printout is scanned into the record.  cbg varies from 70-330.  It is in general higher as the day goes on.  He takes 3 times a day (just before each meal) 12-6-6-units.   He took high-dose vit-D a few mos ago, but no supplement now.   Past Medical History:  Diagnosis Date  . ADENOCARCINOMA, PROSTATE 06/01/2008  . CORONARY ARTERY DISEASE 06/13/2007  . CVA 06/01/2008   x 2  . DIABETES MELLITUS, TYPE I, WITH OPHTHALMIC COMPLICATIONS 3/53/2992  . DM retinopathy ([place])   . HYPERKALEMIA 06/01/2008   due to DM neparopathy  . HYPERLIPIDEMIA 06/13/2007  . HYPERTENSION 06/13/2007  . NEOPLASM, MALIGNANT, SKIN, FACE 06/01/2008  . Other chronic nonalcoholic liver disease 03/13/8340  . RENAL DISEASE 06/01/2008    Past Surgical History:  Procedure Laterality Date  . Carotid Duplex  09/12/2004  . CATARACT EXTRACTION    . CHOLECYSTECTOMY    . ESOPHAGOGASTRODUODENOSCOPY  06/28/1991  . HERNIA REPAIR     right Inguinal hernia  . Rest/Stress Cardiolite  01/22/2001    Social History   Socioeconomic History  . Marital status: Married    Spouse name: Not on file  . Number of children: Not on file  . Years of education: Not on file  . Highest education level: Not on file  Occupational History  . Occupation: Retired    Fish farm manager: RETIRED  Social Needs  . Financial resource strain: Not on file  . Food insecurity:    Worry: Not on file   Inability: Not on file  . Transportation needs:    Medical: Not on file    Non-medical: Not on file  Tobacco Use  . Smoking status: Former Smoker    Last attempt to quit: 11/18/1978    Years since quitting: 39.6  . Smokeless tobacco: Never Used  Substance and Sexual Activity  . Alcohol use: No  . Drug use: No  . Sexual activity: Never    Birth control/protection: None  Lifestyle  . Physical activity:    Days per week: Not on file    Minutes per session: Not on file  . Stress: Not on file  Relationships  . Social connections:    Talks on phone: Not on file    Gets together: Not on file    Attends religious service: Not on file    Active member of club or organization: Not on file    Attends meetings of clubs or organizations: Not on file    Relationship status: Not on file  . Intimate partner violence:    Fear of current or ex partner: Not on file    Emotionally abused: Not on file    Physically abused: Not on file    Forced sexual activity: Not on file  Other Topics Concern  . Not on file  Social History Narrative  . Not on file    Current Outpatient Medications  on File Prior to Visit  Medication Sig Dispense Refill  . amLODipine (NORVASC) 5 MG tablet Take 1 tablet (5 mg total) by mouth daily. 30 tablet 5  . aspirin EC 81 MG tablet Take 1 tablet (81 mg total) by mouth daily. 90 tablet 3  . atorvastatin (LIPITOR) 40 MG tablet Take 2 tablets (80 mg total) by mouth daily at 6 PM. 60 tablet 4  . carvedilol (COREG) 6.25 MG tablet Take 1 tablet (6.25 mg total) by mouth 2 (two) times daily with a meal. 60 tablet 1  . donepezil (ARICEPT) 5 MG tablet Take 1 tablet (5 mg total) by mouth at bedtime. 30 tablet 11  . ferrous sulfate 325 (65 FE) MG tablet Take 1 tablet (325 mg total) by mouth daily with breakfast. (Patient not taking: Reported on 06/15/2018) 30 tablet 3  . LORazepam (ATIVAN) 0.5 MG tablet Take 1 tablet (0.5 mg total) by mouth daily as needed for anxiety. 30 tablet 3  .  meclizine (ANTIVERT) 12.5 MG tablet TAKE 1 TABLET BY MOUTH THREE TIMES DAILY AS NEEDED FOR DIZZINESS 30 tablet 3  . nitroGLYCERIN (NITROSTAT) 0.4 MG SL tablet Place 1 tablet (0.4 mg total) under the tongue every 5 (five) minutes as needed. For chest pain 25 tablet 3  . vitamin B-12 1000 MCG tablet Take 1 tablet (1,000 mcg total) by mouth daily. 30 tablet 1   No current facility-administered medications on file prior to visit.     No Known Allergies  No family history on file.  BP 128/68 (BP Location: Left Arm, Patient Position: Sitting, Cuff Size: Normal)   Pulse 60   Ht 6' (1.829 m)   Wt 187 lb 6.4 oz (85 kg)   SpO2 97%   BMI 25.42 kg/m    Review of Systems Diarrhea is resolved.     Objective:   Physical Exam VITAL SIGNS:  See vs page.  GENERAL: no distress.  Pulses: foot pulses are intact bilaterally.   MSK: no deformity of the feet or ankles.  CV: trace bilat edema of the legs, and bilat vv's.  Skin:  no ulcer on the feet or ankles.  normal color and temp on the feet and ankles.  Neuro: sensation is intact to touch on the feet and ankles, but decreased from normal.  Ext: both great toenails are absent.    outside test results are reviewed: A1c=6.6% Ca++=8.1    Assessment & Plan:  Type 2 DM, with renal insuff: overcontrolled Hyperkalemia, exac by acei Vit-D def: recheck today  Patient Instructions  I have sent a prescription to your pharmacy, for "repaglinide," to take 3 times a day (just before each meal) Please reduce the lisinopril to 5 mg daily.   blood tests are requested for you today.  We'll let you know about the results. Please come back for a follow-up appointment in 1 month.

## 2018-06-15 NOTE — Patient Instructions (Addendum)
I have sent a prescription to your pharmacy, for "repaglinide," to take 3 times a day (just before each meal) Please reduce the lisinopril to 5 mg daily.   blood tests are requested for you today.  We'll let you know about the results. Please come back for a follow-up appointment in 1 month.

## 2018-06-16 LAB — PTH, INTACT AND CALCIUM
CALCIUM: 8.2 mg/dL — AB (ref 8.6–10.3)
PTH: 85 pg/mL — AB (ref 14–64)

## 2018-06-16 LAB — VITAMIN D 25 HYDROXY (VIT D DEFICIENCY, FRACTURES): VITD: 21.92 ng/mL — AB (ref 30.00–100.00)

## 2018-06-16 MED ORDER — ERGOCALCIFEROL 1.25 MG (50000 UT) PO CAPS: 50000.0000 [IU] | ORAL_CAPSULE | ORAL | 0 refills | Status: DC

## 2018-06-17 ENCOUNTER — Other Ambulatory Visit: Payer: Self-pay

## 2018-06-30 ENCOUNTER — Telehealth: Payer: Self-pay | Admitting: Endocrinology

## 2018-06-30 MED ORDER — LORAZEPAM 0.5 MG PO TABS: 0.5000 mg | ORAL_TABLET | Freq: Every day | ORAL | 3 refills | Status: DC | PRN

## 2018-06-30 MED ORDER — DONEPEZIL HCL 5 MG PO TABS: 5.0000 mg | ORAL_TABLET | Freq: Every day | ORAL | 11 refills | Status: DC

## 2018-06-30 MED ORDER — AMLODIPINE BESYLATE 5 MG PO TABS: 5.0000 mg | ORAL_TABLET | Freq: Every day | ORAL | 5 refills | Status: DC

## 2018-06-30 MED ORDER — ERGOCALCIFEROL 1.25 MG (50000 UT) PO CAPS: 50000.0000 [IU] | ORAL_CAPSULE | ORAL | 0 refills | Status: DC

## 2018-06-30 MED ORDER — ATORVASTATIN CALCIUM 40 MG PO TABS: 80.0000 mg | ORAL_TABLET | Freq: Every day | ORAL | 4 refills | Status: DC

## 2018-06-30 MED ORDER — LISINOPRIL 5 MG PO TABS: 5.0000 mg | ORAL_TABLET | Freq: Every day | ORAL | 3 refills | Status: DC

## 2018-06-30 MED ORDER — REPAGLINIDE 0.5 MG PO TABS: 0.5000 mg | ORAL_TABLET | Freq: Three times a day (TID) | ORAL | 3 refills | Status: DC

## 2018-06-30 MED ORDER — MECLIZINE HCL 12.5 MG PO TABS: ORAL_TABLET | ORAL | 3 refills | Status: DC

## 2018-06-30 MED ORDER — CARVEDILOL 6.25 MG PO TABS: 6.2500 mg | ORAL_TABLET | Freq: Two times a day (BID) | ORAL | 1 refills | Status: DC

## 2018-06-30 NOTE — Telephone Encounter (Signed)
On Dr Rosario Adie desk to sign

## 2018-06-30 NOTE — Telephone Encounter (Signed)
Patients son stated he is needing everything that Dr Loanne Drilling has prescribed printed out so he can pick these up and take them to the [facility] for them to start filling his medication   Please advise [name] [phone]

## 2018-07-02 NOTE — Telephone Encounter (Signed)
I called patient's son, [name] & LVM that prescriptions were at front office to be picked up when convenient.

## 2018-07-09 ENCOUNTER — Telehealth: Payer: Self-pay | Admitting: Internal Medicine

## 2018-07-09 NOTE — Telephone Encounter (Signed)
Yes, I will accept him

## 2018-07-09 NOTE — Telephone Encounter (Signed)
Copied from [place] [id number]. Topic: Appointment Scheduling - Scheduling Inquiry for Clinic >> Jul 09, 2018  3:46 PM Synthia Innocent wrote: Reason for CRM: Current patient of Dr Loanne Drilling, he is no longer seeing primary care patients, his wife, Chad Mcmillan is a patient of Dr Quay Burow, requesting to establish care with her. Please advise

## 2018-07-12 ENCOUNTER — Other Ambulatory Visit: Payer: Self-pay | Admitting: Endocrinology

## 2018-07-16 NOTE — Telephone Encounter (Signed)
appt scheduled

## 2018-07-17 ENCOUNTER — Emergency Department (HOSPITAL_COMMUNITY): Payer: Medicare Other

## 2018-07-17 ENCOUNTER — Other Ambulatory Visit: Payer: Self-pay

## 2018-07-17 ENCOUNTER — Encounter (HOSPITAL_COMMUNITY): Payer: Self-pay | Admitting: Oncology

## 2018-07-17 ENCOUNTER — Inpatient Hospital Stay (HOSPITAL_COMMUNITY)
Admission: EM | Admit: 2018-07-17 | Discharge: 2018-07-24 | DRG: 480 | Disposition: A | Discharge status: Skilled Nursing Facility | Payer: Medicare Other | Attending: Internal Medicine | Admitting: Internal Medicine

## 2018-07-17 ENCOUNTER — Inpatient Hospital Stay (HOSPITAL_COMMUNITY): Payer: Medicare Other | Admitting: Anesthesiology

## 2018-07-17 ENCOUNTER — Inpatient Hospital Stay (HOSPITAL_COMMUNITY): Payer: Medicare Other

## 2018-07-17 ENCOUNTER — Encounter (HOSPITAL_COMMUNITY)
Admission: EM | Disposition: A | Discharge status: Skilled Nursing Facility | Payer: Self-pay | Source: Home / Self Care | Attending: Internal Medicine

## 2018-07-17 DIAGNOSIS — Z419 Encounter for procedure for purposes other than remedying health state, unspecified: Secondary | ICD-10-CM

## 2018-07-17 DIAGNOSIS — E1151 Type 2 diabetes mellitus with diabetic peripheral angiopathy without gangrene: Secondary | ICD-10-CM | POA: Diagnosis present

## 2018-07-17 DIAGNOSIS — I130 Hypertensive heart and chronic kidney disease with heart failure and stage 1 through stage 4 chronic kidney disease, or unspecified chronic kidney disease: Secondary | ICD-10-CM | POA: Diagnosis present

## 2018-07-17 DIAGNOSIS — Z951 Presence of aortocoronary bypass graft: Secondary | ICD-10-CM

## 2018-07-17 DIAGNOSIS — I5033 Acute on chronic diastolic (congestive) heart failure: Secondary | ICD-10-CM | POA: Diagnosis not present

## 2018-07-17 DIAGNOSIS — D62 Acute posthemorrhagic anemia: Secondary | ICD-10-CM | POA: Diagnosis not present

## 2018-07-17 DIAGNOSIS — Z7982 Long term (current) use of aspirin: Secondary | ICD-10-CM | POA: Diagnosis not present

## 2018-07-17 DIAGNOSIS — J181 Lobar pneumonia, unspecified organism: Secondary | ICD-10-CM

## 2018-07-17 DIAGNOSIS — J189 Pneumonia, unspecified organism: Secondary | ICD-10-CM | POA: Diagnosis present

## 2018-07-17 DIAGNOSIS — D649 Anemia, unspecified: Secondary | ICD-10-CM | POA: Diagnosis present

## 2018-07-17 DIAGNOSIS — F0390 Unspecified dementia without behavioral disturbance: Secondary | ICD-10-CM | POA: Diagnosis present

## 2018-07-17 DIAGNOSIS — I5031 Acute diastolic (congestive) heart failure: Secondary | ICD-10-CM | POA: Diagnosis not present

## 2018-07-17 DIAGNOSIS — R9389 Abnormal findings on diagnostic imaging of other specified body structures: Secondary | ICD-10-CM | POA: Diagnosis present

## 2018-07-17 DIAGNOSIS — I5032 Chronic diastolic (congestive) heart failure: Secondary | ICD-10-CM

## 2018-07-17 DIAGNOSIS — I5030 Unspecified diastolic (congestive) heart failure: Secondary | ICD-10-CM | POA: Diagnosis not present

## 2018-07-17 DIAGNOSIS — W010XXA Fall on same level from slipping, tripping and stumbling without subsequent striking against object, initial encounter: Secondary | ICD-10-CM | POA: Diagnosis present

## 2018-07-17 DIAGNOSIS — Z794 Long term (current) use of insulin: Secondary | ICD-10-CM | POA: Diagnosis not present

## 2018-07-17 DIAGNOSIS — D631 Anemia in chronic kidney disease: Secondary | ICD-10-CM | POA: Diagnosis present

## 2018-07-17 DIAGNOSIS — J90 Pleural effusion, not elsewhere classified: Secondary | ICD-10-CM | POA: Diagnosis not present

## 2018-07-17 DIAGNOSIS — M25551 Pain in right hip: Secondary | ICD-10-CM | POA: Diagnosis present

## 2018-07-17 DIAGNOSIS — R627 Adult failure to thrive: Secondary | ICD-10-CM | POA: Diagnosis present

## 2018-07-17 DIAGNOSIS — Z87891 Personal history of nicotine dependence: Secondary | ICD-10-CM

## 2018-07-17 DIAGNOSIS — Z862 Personal history of diseases of the blood and blood-forming organs and certain disorders involving the immune mechanism: Secondary | ICD-10-CM

## 2018-07-17 DIAGNOSIS — R339 Retention of urine, unspecified: Secondary | ICD-10-CM | POA: Diagnosis not present

## 2018-07-17 DIAGNOSIS — N183 Chronic kidney disease, stage 3 unspecified: Secondary | ICD-10-CM

## 2018-07-17 DIAGNOSIS — E875 Hyperkalemia: Secondary | ICD-10-CM | POA: Diagnosis not present

## 2018-07-17 DIAGNOSIS — Z9049 Acquired absence of other specified parts of digestive tract: Secondary | ICD-10-CM

## 2018-07-17 DIAGNOSIS — N289 Disorder of kidney and ureter, unspecified: Secondary | ICD-10-CM | POA: Diagnosis not present

## 2018-07-17 DIAGNOSIS — E876 Hypokalemia: Secondary | ICD-10-CM | POA: Diagnosis not present

## 2018-07-17 DIAGNOSIS — S42032A Displaced fracture of lateral end of left clavicle, initial encounter for closed fracture: Secondary | ICD-10-CM | POA: Diagnosis present

## 2018-07-17 DIAGNOSIS — Z7984 Long term (current) use of oral hypoglycemic drugs: Secondary | ICD-10-CM | POA: Diagnosis not present

## 2018-07-17 DIAGNOSIS — J9691 Respiratory failure, unspecified with hypoxia: Secondary | ICD-10-CM

## 2018-07-17 DIAGNOSIS — R52 Pain, unspecified: Secondary | ICD-10-CM

## 2018-07-17 DIAGNOSIS — S72111A Displaced fracture of greater trochanter of right femur, initial encounter for closed fracture: Principal | ICD-10-CM | POA: Diagnosis present

## 2018-07-17 DIAGNOSIS — Y92009 Unspecified place in unspecified non-institutional (private) residence as the place of occurrence of the external cause: Secondary | ICD-10-CM | POA: Diagnosis not present

## 2018-07-17 DIAGNOSIS — Y9301 Activity, walking, marching and hiking: Secondary | ICD-10-CM | POA: Diagnosis present

## 2018-07-17 DIAGNOSIS — N184 Chronic kidney disease, stage 4 (severe): Secondary | ICD-10-CM | POA: Diagnosis present

## 2018-07-17 DIAGNOSIS — D638 Anemia in other chronic diseases classified elsewhere: Secondary | ICD-10-CM | POA: Diagnosis not present

## 2018-07-17 DIAGNOSIS — E1122 Type 2 diabetes mellitus with diabetic chronic kidney disease: Secondary | ICD-10-CM | POA: Diagnosis present

## 2018-07-17 DIAGNOSIS — N179 Acute kidney failure, unspecified: Secondary | ICD-10-CM

## 2018-07-17 DIAGNOSIS — Z79899 Other long term (current) drug therapy: Secondary | ICD-10-CM

## 2018-07-17 DIAGNOSIS — J9601 Acute respiratory failure with hypoxia: Secondary | ICD-10-CM | POA: Diagnosis not present

## 2018-07-17 DIAGNOSIS — D696 Thrombocytopenia, unspecified: Secondary | ICD-10-CM | POA: Diagnosis present

## 2018-07-17 DIAGNOSIS — I2510 Atherosclerotic heart disease of native coronary artery without angina pectoris: Secondary | ICD-10-CM | POA: Diagnosis present

## 2018-07-17 DIAGNOSIS — N189 Chronic kidney disease, unspecified: Secondary | ICD-10-CM | POA: Diagnosis not present

## 2018-07-17 DIAGNOSIS — E1165 Type 2 diabetes mellitus with hyperglycemia: Secondary | ICD-10-CM | POA: Diagnosis present

## 2018-07-17 DIAGNOSIS — S72101A Unspecified trochanteric fracture of right femur, initial encounter for closed fracture: Secondary | ICD-10-CM

## 2018-07-17 DIAGNOSIS — E11319 Type 2 diabetes mellitus with unspecified diabetic retinopathy without macular edema: Secondary | ICD-10-CM | POA: Diagnosis present

## 2018-07-17 DIAGNOSIS — I25810 Atherosclerosis of coronary artery bypass graft(s) without angina pectoris: Secondary | ICD-10-CM | POA: Diagnosis not present

## 2018-07-17 DIAGNOSIS — I480 Paroxysmal atrial fibrillation: Secondary | ICD-10-CM

## 2018-07-17 DIAGNOSIS — R0902 Hypoxemia: Secondary | ICD-10-CM

## 2018-07-17 DIAGNOSIS — I10 Essential (primary) hypertension: Secondary | ICD-10-CM | POA: Diagnosis not present

## 2018-07-17 DIAGNOSIS — I4891 Unspecified atrial fibrillation: Secondary | ICD-10-CM | POA: Diagnosis not present

## 2018-07-17 DIAGNOSIS — Z8673 Personal history of transient ischemic attack (TIA), and cerebral infarction without residual deficits: Secondary | ICD-10-CM

## 2018-07-17 DIAGNOSIS — E785 Hyperlipidemia, unspecified: Secondary | ICD-10-CM | POA: Diagnosis present

## 2018-07-17 HISTORY — PX: INTRAMEDULLARY (IM) NAIL INTERTROCHANTERIC: SHX5875

## 2018-07-17 HISTORY — PX: FEMUR IM NAIL: SHX1597

## 2018-07-17 LAB — URINALYSIS, ROUTINE W REFLEX MICROSCOPIC
BILIRUBIN URINE: NEGATIVE
Bacteria, UA: NONE SEEN
GLUCOSE, UA: 50 mg/dL — AB
Ketones, ur: NEGATIVE mg/dL
LEUKOCYTES UA: NEGATIVE
NITRITE: NEGATIVE
SPECIFIC GRAVITY, URINE: 1.012 (ref 1.005–1.030)
pH: 5 (ref 5.0–8.0)

## 2018-07-17 LAB — CBC WITH DIFFERENTIAL/PLATELET
ABS IMMATURE GRANULOCYTES: 0 10*3/uL (ref 0.0–0.1)
BASOS PCT: 0 %
Basophils Absolute: 0 10*3/uL (ref 0.0–0.1)
EOS ABS: 0.1 10*3/uL (ref 0.0–0.7)
Eosinophils Relative: 2 %
HEMATOCRIT: 27.7 % — AB (ref 39.0–52.0)
Hemoglobin: 8.4 g/dL — ABNORMAL LOW (ref 13.0–17.0)
IMMATURE GRANULOCYTES: 0 %
LYMPHS ABS: 1.1 10*3/uL (ref 0.7–4.0)
Lymphocytes Relative: 15 %
MCH: 31.1 pg (ref 26.0–34.0)
MCHC: 30.3 g/dL (ref 30.0–36.0)
MCV: 102.6 fL — AB (ref 78.0–100.0)
MONO ABS: 0.4 10*3/uL (ref 0.1–1.0)
Monocytes Relative: 6 %
NEUTROS ABS: 5.8 10*3/uL (ref 1.7–7.7)
NEUTROS PCT: 77 %
Platelets: DECREASED 10*3/uL (ref 150–400)
RBC: 2.7 MIL/uL — ABNORMAL LOW (ref 4.22–5.81)
RDW: 13.5 % (ref 11.5–15.5)
WBC: 7.5 10*3/uL (ref 4.0–10.5)

## 2018-07-17 LAB — ABO/RH: ABO/RH(D): A POS

## 2018-07-17 LAB — GLUCOSE, CAPILLARY
GLUCOSE-CAPILLARY: 127 mg/dL — AB (ref 70–99)
GLUCOSE-CAPILLARY: 147 mg/dL — AB (ref 70–99)
Glucose-Capillary: 114 mg/dL — ABNORMAL HIGH (ref 70–99)
Glucose-Capillary: 128 mg/dL — ABNORMAL HIGH (ref 70–99)
Glucose-Capillary: 151 mg/dL — ABNORMAL HIGH (ref 70–99)

## 2018-07-17 LAB — BASIC METABOLIC PANEL
ANION GAP: 9 (ref 5–15)
BUN: 39 mg/dL — ABNORMAL HIGH (ref 8–23)
CALCIUM: 8.5 mg/dL — AB (ref 8.9–10.3)
CO2: 18 mmol/L — ABNORMAL LOW (ref 22–32)
CREATININE: 2.18 mg/dL — AB (ref 0.61–1.24)
Chloride: 114 mmol/L — ABNORMAL HIGH (ref 98–111)
GFR, EST AFRICAN AMERICAN: 29 mL/min — AB (ref >60)
GFR, EST NON AFRICAN AMERICAN: 25 mL/min — AB (ref >60)
Glucose, Bld: 126 mg/dL — ABNORMAL HIGH (ref 70–99)
Potassium: 4.9 mmol/L (ref 3.5–5.1)
Sodium: 141 mmol/L (ref 135–145)

## 2018-07-17 LAB — PROTIME-INR
INR: 1.13
Prothrombin Time: 14.4 seconds (ref 11.4–15.2)

## 2018-07-17 LAB — STREP PNEUMONIAE URINARY ANTIGEN: Strep Pneumo Urinary Antigen: NEGATIVE

## 2018-07-17 LAB — PLATELET COUNT: PLATELETS: 74 10*3/uL — AB (ref 150–400)

## 2018-07-17 SURGERY — FIXATION, FRACTURE, INTERTROCHANTERIC, WITH INTRAMEDULLARY ROD
Anesthesia: General

## 2018-07-17 MED ORDER — MORPHINE SULFATE (PF) 2 MG/ML IV SOLN: 0.5000 mg | INTRAVENOUS | Status: DC | PRN

## 2018-07-17 MED ORDER — EPHEDRINE 5 MG/ML INJ
INTRAVENOUS | Status: AC
  Filled 2018-07-17: qty 10

## 2018-07-17 MED ORDER — MENTHOL 3 MG MT LOZG: 1.0000 | LOZENGE | OROMUCOSAL | Status: DC | PRN

## 2018-07-17 MED ORDER — MORPHINE SULFATE (PF) 2 MG/ML IV SOLN: 1.0000 mg | INTRAVENOUS | Status: DC | PRN

## 2018-07-17 MED ORDER — SODIUM CHLORIDE 0.9 % IV SOLN
INTRAVENOUS | Status: DC | PRN
  Administered 2018-07-17: 30 ug/min via INTRAVENOUS

## 2018-07-17 MED ORDER — SODIUM CHLORIDE 0.9 % IV SOLN: Freq: Once | INTRAVENOUS | Status: DC

## 2018-07-17 MED ORDER — FENTANYL CITRATE (PF) 100 MCG/2ML IJ SOLN
50.0000 ug | INTRAMUSCULAR | Status: DC | PRN
  Administered 2018-07-17 (×2): 50 ug via INTRAVENOUS
  Filled 2018-07-17 (×2): qty 2

## 2018-07-17 MED ORDER — EPHEDRINE SULFATE-NACL 50-0.9 MG/10ML-% IV SOSY
PREFILLED_SYRINGE | INTRAVENOUS | Status: DC | PRN
  Administered 2018-07-17 (×2): 10 mg via INTRAVENOUS

## 2018-07-17 MED ORDER — SORBITOL 70 % SOLN
30.0000 mL | Freq: Every day | Status: DC | PRN
  Filled 2018-07-17: qty 30

## 2018-07-17 MED ORDER — HYDRALAZINE HCL 20 MG/ML IJ SOLN
10.0000 mg | Freq: Three times a day (TID) | INTRAMUSCULAR | Status: DC | PRN
  Administered 2018-07-19 – 2018-07-20 (×2): 10 mg via INTRAVENOUS
  Filled 2018-07-17 (×2): qty 1

## 2018-07-17 MED ORDER — ONDANSETRON HCL 4 MG/2ML IJ SOLN
INTRAMUSCULAR | Status: AC
  Filled 2018-07-17: qty 2

## 2018-07-17 MED ORDER — FENTANYL CITRATE (PF) 100 MCG/2ML IJ SOLN
50.0000 ug | INTRAMUSCULAR | Status: DC
  Administered 2018-07-17 (×2): 50 ug via INTRAVENOUS
  Filled 2018-07-17 (×2): qty 2

## 2018-07-17 MED ORDER — ATORVASTATIN CALCIUM 80 MG PO TABS
80.0000 mg | ORAL_TABLET | Freq: Every day | ORAL | Status: DC
  Administered 2018-07-17 – 2018-07-23 (×6): 80 mg via ORAL
  Filled 2018-07-17 (×6): qty 1

## 2018-07-17 MED ORDER — ENOXAPARIN SODIUM 30 MG/0.3ML ~~LOC~~ SOLN
30.0000 mg | SUBCUTANEOUS | Status: DC
  Administered 2018-07-18 – 2018-07-19 (×2): 30 mg via SUBCUTANEOUS
  Filled 2018-07-17 (×2): qty 0.3

## 2018-07-17 MED ORDER — FENTANYL CITRATE (PF) 250 MCG/5ML IJ SOLN
INTRAMUSCULAR | Status: DC | PRN
  Administered 2018-07-17 (×2): 50 ug via INTRAVENOUS

## 2018-07-17 MED ORDER — METHOCARBAMOL 500 MG PO TABS
500.0000 mg | ORAL_TABLET | Freq: Four times a day (QID) | ORAL | Status: DC | PRN
  Administered 2018-07-18: 500 mg via ORAL
  Filled 2018-07-17 (×2): qty 1

## 2018-07-17 MED ORDER — ENOXAPARIN SODIUM 40 MG/0.4ML ~~LOC~~ SOLN: 40.0000 mg | Freq: Every day | SUBCUTANEOUS | 0 refills | Status: DC

## 2018-07-17 MED ORDER — PHENOL 1.4 % MT LIQD: 1.0000 | OROMUCOSAL | Status: DC | PRN

## 2018-07-17 MED ORDER — METHOCARBAMOL 1000 MG/10ML IJ SOLN
500.0000 mg | Freq: Four times a day (QID) | INTRAVENOUS | Status: DC | PRN
  Filled 2018-07-17: qty 5

## 2018-07-17 MED ORDER — VITAMIN B-12 1000 MCG PO TABS
1000.0000 ug | ORAL_TABLET | Freq: Every day | ORAL | Status: DC
  Administered 2018-07-18 – 2018-07-24 (×7): 1000 ug via ORAL
  Filled 2018-07-17 (×7): qty 1

## 2018-07-17 MED ORDER — PROPOFOL 10 MG/ML IV BOLUS
INTRAVENOUS | Status: DC | PRN
  Administered 2018-07-17: 100 mg via INTRAVENOUS

## 2018-07-17 MED ORDER — HYDROCODONE-ACETAMINOPHEN 7.5-325 MG PO TABS
1.0000 | ORAL_TABLET | ORAL | Status: DC | PRN
  Administered 2018-07-19: 1 via ORAL
  Filled 2018-07-17: qty 1

## 2018-07-17 MED ORDER — CEFAZOLIN SODIUM-DEXTROSE 2-4 GM/100ML-% IV SOLN
2.0000 g | INTRAVENOUS | Status: AC
  Administered 2018-07-17: 2 g via INTRAVENOUS

## 2018-07-17 MED ORDER — ACETAMINOPHEN 500 MG PO TABS
500.0000 mg | ORAL_TABLET | Freq: Four times a day (QID) | ORAL | Status: AC
  Administered 2018-07-17 – 2018-07-18 (×2): 500 mg via ORAL
  Filled 2018-07-17 (×2): qty 1

## 2018-07-17 MED ORDER — AMLODIPINE BESYLATE 5 MG PO TABS
5.0000 mg | ORAL_TABLET | Freq: Every day | ORAL | Status: DC
  Administered 2018-07-18 – 2018-07-19 (×2): 5 mg via ORAL
  Filled 2018-07-17 (×2): qty 1

## 2018-07-17 MED ORDER — CEFAZOLIN SODIUM-DEXTROSE 2-4 GM/100ML-% IV SOLN: 2.0000 g | Freq: Four times a day (QID) | INTRAVENOUS | Status: DC

## 2018-07-17 MED ORDER — HYDROCODONE-ACETAMINOPHEN 5-325 MG PO TABS
1.0000 | ORAL_TABLET | ORAL | Status: DC | PRN
  Administered 2018-07-18 – 2018-07-23 (×9): 1 via ORAL
  Administered 2018-07-24: 2 via ORAL
  Filled 2018-07-17 (×8): qty 1
  Filled 2018-07-17: qty 2
  Filled 2018-07-17: qty 1

## 2018-07-17 MED ORDER — ONDANSETRON HCL 4 MG PO TABS: 4.0000 mg | ORAL_TABLET | Freq: Four times a day (QID) | ORAL | Status: DC | PRN

## 2018-07-17 MED ORDER — DEXAMETHASONE SODIUM PHOSPHATE 10 MG/ML IJ SOLN
INTRAMUSCULAR | Status: DC | PRN
  Administered 2018-07-17: 5 mg via INTRAVENOUS

## 2018-07-17 MED ORDER — PHENYLEPHRINE 40 MCG/ML (10ML) SYRINGE FOR IV PUSH (FOR BLOOD PRESSURE SUPPORT)
PREFILLED_SYRINGE | INTRAVENOUS | Status: AC
  Filled 2018-07-17: qty 10

## 2018-07-17 MED ORDER — TRANEXAMIC ACID 1000 MG/10ML IV SOLN
1000.0000 mg | INTRAVENOUS | Status: AC
  Administered 2018-07-17: 1000 mg via INTRAVENOUS
  Filled 2018-07-17: qty 1000

## 2018-07-17 MED ORDER — ONDANSETRON HCL 4 MG/2ML IJ SOLN
INTRAMUSCULAR | Status: DC | PRN
  Administered 2018-07-17: 4 mg via INTRAVENOUS

## 2018-07-17 MED ORDER — 0.9 % SODIUM CHLORIDE (POUR BTL) OPTIME
TOPICAL | Status: DC | PRN
  Administered 2018-07-17: 1000 mL

## 2018-07-17 MED ORDER — FENTANYL CITRATE (PF) 250 MCG/5ML IJ SOLN
INTRAMUSCULAR | Status: AC
  Filled 2018-07-17: qty 5

## 2018-07-17 MED ORDER — INSULIN ASPART 100 UNIT/ML ~~LOC~~ SOLN
0.0000 [IU] | SUBCUTANEOUS | Status: DC
  Administered 2018-07-17: 2 [IU] via SUBCUTANEOUS
  Administered 2018-07-17: 1 [IU] via SUBCUTANEOUS
  Administered 2018-07-18 (×2): 2 [IU] via SUBCUTANEOUS
  Administered 2018-07-18 (×2): 1 [IU] via SUBCUTANEOUS
  Administered 2018-07-19: 2 [IU] via SUBCUTANEOUS
  Administered 2018-07-19: 1 [IU] via SUBCUTANEOUS
  Administered 2018-07-19 (×2): 2 [IU] via SUBCUTANEOUS
  Administered 2018-07-19: 1 [IU] via SUBCUTANEOUS
  Administered 2018-07-20: 2 [IU] via SUBCUTANEOUS
  Administered 2018-07-20 (×3): 1 [IU] via SUBCUTANEOUS
  Administered 2018-07-20 – 2018-07-21 (×4): 2 [IU] via SUBCUTANEOUS
  Administered 2018-07-21: 3 [IU] via SUBCUTANEOUS
  Administered 2018-07-22: 2 [IU] via SUBCUTANEOUS
  Administered 2018-07-22: 1 [IU] via SUBCUTANEOUS

## 2018-07-17 MED ORDER — ONDANSETRON HCL 4 MG/2ML IJ SOLN
4.0000 mg | Freq: Four times a day (QID) | INTRAMUSCULAR | Status: DC | PRN
  Administered 2018-07-20: 4 mg via INTRAVENOUS
  Filled 2018-07-17: qty 2

## 2018-07-17 MED ORDER — SODIUM CHLORIDE 0.9 % IV SOLN
INTRAVENOUS | Status: DC
  Administered 2018-07-17: 16:00:00 via INTRAVENOUS

## 2018-07-17 MED ORDER — FENTANYL CITRATE (PF) 100 MCG/2ML IJ SOLN: 25.0000 ug | INTRAMUSCULAR | Status: DC | PRN

## 2018-07-17 MED ORDER — ALUM & MAG HYDROXIDE-SIMETH 200-200-20 MG/5ML PO SUSP: 30.0000 mL | ORAL | Status: DC | PRN

## 2018-07-17 MED ORDER — LIDOCAINE 2% (20 MG/ML) 5 ML SYRINGE
INTRAMUSCULAR | Status: AC
  Filled 2018-07-17: qty 5

## 2018-07-17 MED ORDER — METHOCARBAMOL 500 MG PO TABS: 500.0000 mg | ORAL_TABLET | Freq: Four times a day (QID) | ORAL | Status: DC | PRN

## 2018-07-17 MED ORDER — HYDROCODONE-ACETAMINOPHEN 7.5-325 MG PO TABS: 1.0000 | ORAL_TABLET | Freq: Four times a day (QID) | ORAL | 0 refills | Status: DC | PRN

## 2018-07-17 MED ORDER — MAGNESIUM CITRATE PO SOLN
1.0000 | Freq: Once | ORAL | Status: DC | PRN
  Filled 2018-07-17: qty 296

## 2018-07-17 MED ORDER — CARVEDILOL 6.25 MG PO TABS
6.2500 mg | ORAL_TABLET | Freq: Two times a day (BID) | ORAL | Status: DC
  Administered 2018-07-17 – 2018-07-19 (×4): 6.25 mg via ORAL
  Filled 2018-07-17 (×5): qty 1

## 2018-07-17 MED ORDER — CEFAZOLIN SODIUM 1 G IJ SOLR
INTRAMUSCULAR | Status: AC
  Filled 2018-07-17: qty 30

## 2018-07-17 MED ORDER — LORAZEPAM 0.5 MG PO TABS: 0.5000 mg | ORAL_TABLET | Freq: Every day | ORAL | Status: DC | PRN

## 2018-07-17 MED ORDER — POLYETHYLENE GLYCOL 3350 17 G PO PACK: 17.0000 g | PACK | Freq: Every day | ORAL | Status: DC | PRN

## 2018-07-17 MED ORDER — ACETAMINOPHEN 325 MG PO TABS
325.0000 mg | ORAL_TABLET | Freq: Four times a day (QID) | ORAL | Status: DC | PRN
  Administered 2018-07-19 – 2018-07-20 (×2): 650 mg via ORAL
  Filled 2018-07-17 (×2): qty 2

## 2018-07-17 MED ORDER — DEXAMETHASONE SODIUM PHOSPHATE 10 MG/ML IJ SOLN
INTRAMUSCULAR | Status: AC
  Filled 2018-07-17: qty 1

## 2018-07-17 MED ORDER — SODIUM CHLORIDE 0.9 % IV SOLN
500.0000 mg | Freq: Every day | INTRAVENOUS | Status: DC
  Filled 2018-07-17: qty 500

## 2018-07-17 MED ORDER — ROCURONIUM BROMIDE 10 MG/ML (PF) SYRINGE
PREFILLED_SYRINGE | INTRAVENOUS | Status: DC | PRN
  Administered 2018-07-17: 40 mg via INTRAVENOUS

## 2018-07-17 MED ORDER — DONEPEZIL HCL 10 MG PO TABS
5.0000 mg | ORAL_TABLET | Freq: Every day | ORAL | Status: DC
  Administered 2018-07-17 – 2018-07-23 (×6): 5 mg via ORAL
  Filled 2018-07-17 (×6): qty 1

## 2018-07-17 MED ORDER — CALCIUM CARBONATE-VITAMIN D 500-200 MG-UNIT PO TABS: 1.0000 | ORAL_TABLET | Freq: Three times a day (TID) | ORAL | 12 refills | Status: DC

## 2018-07-17 MED ORDER — LIDOCAINE 2% (20 MG/ML) 5 ML SYRINGE
INTRAMUSCULAR | Status: DC | PRN
  Administered 2018-07-17: 80 mg via INTRAVENOUS

## 2018-07-17 MED ORDER — CEFAZOLIN SODIUM-DEXTROSE 2-4 GM/100ML-% IV SOLN
INTRAVENOUS | Status: AC
  Filled 2018-07-17: qty 100

## 2018-07-17 MED ORDER — DOCUSATE SODIUM 100 MG PO CAPS
100.0000 mg | ORAL_CAPSULE | Freq: Two times a day (BID) | ORAL | Status: DC
  Administered 2018-07-17 – 2018-07-24 (×12): 100 mg via ORAL
  Filled 2018-07-17 (×12): qty 1

## 2018-07-17 MED ORDER — PROPOFOL 10 MG/ML IV BOLUS
INTRAVENOUS | Status: AC
  Filled 2018-07-17: qty 20

## 2018-07-17 MED ORDER — SENNOSIDES-DOCUSATE SODIUM 8.6-50 MG PO TABS
1.0000 | ORAL_TABLET | Freq: Every evening | ORAL | Status: DC | PRN
  Administered 2018-07-23: 1 via ORAL
  Filled 2018-07-17: qty 1

## 2018-07-17 MED ORDER — SODIUM CHLORIDE 0.9 % IV SOLN
1.0000 g | Freq: Every day | INTRAVENOUS | Status: DC
  Administered 2018-07-18: 1 g via INTRAVENOUS
  Filled 2018-07-17: qty 10

## 2018-07-17 MED ORDER — ENOXAPARIN SODIUM 40 MG/0.4ML ~~LOC~~ SOLN: 40.0000 mg | SUBCUTANEOUS | Status: DC

## 2018-07-17 MED ORDER — POVIDONE-IODINE 10 % EX SWAB: 2.0000 "application " | Freq: Once | CUTANEOUS | Status: DC

## 2018-07-17 MED ORDER — MIDAZOLAM HCL 2 MG/2ML IJ SOLN
INTRAMUSCULAR | Status: AC
  Filled 2018-07-17: qty 2

## 2018-07-17 MED ORDER — ONDANSETRON HCL 4 MG/2ML IJ SOLN: 4.0000 mg | Freq: Once | INTRAMUSCULAR | Status: DC | PRN

## 2018-07-17 MED ORDER — SODIUM CHLORIDE 0.9 % IV SOLN
INTRAVENOUS | Status: AC
  Administered 2018-07-17: 12:00:00 via INTRAVENOUS

## 2018-07-17 MED ORDER — SUGAMMADEX SODIUM 200 MG/2ML IV SOLN
INTRAVENOUS | Status: DC | PRN
  Administered 2018-07-17: 200 mg via INTRAVENOUS

## 2018-07-17 SURGICAL SUPPLY — 39 items
BNDG COHESIVE 4X5 TAN NS LF (GAUZE/BANDAGES/DRESSINGS) ×3 IMPLANT
BNDG COHESIVE 6X5 TAN STRL LF (GAUZE/BANDAGES/DRESSINGS) IMPLANT
BNDG GAUZE ELAST 4 BULKY (GAUZE/BANDAGES/DRESSINGS) ×3 IMPLANT
COVER PERINEAL POST (MISCELLANEOUS) ×3 IMPLANT
COVER SURGICAL LIGHT HANDLE (MISCELLANEOUS) ×3 IMPLANT
DRAPE STERI IOBAN 125X83 (DRAPES) ×3 IMPLANT
DRSG MEPILEX BORDER 4X4 (GAUZE/BANDAGES/DRESSINGS) ×3 IMPLANT
DRSG MEPILEX BORDER 4X8 (GAUZE/BANDAGES/DRESSINGS) ×3 IMPLANT
DRSG PAD ABDOMINAL 8X10 ST (GAUZE/BANDAGES/DRESSINGS) ×6 IMPLANT
DURAPREP 26ML APPLICATOR (WOUND CARE) ×3 IMPLANT
ELECT REM PT RETURN 9FT ADLT (ELECTROSURGICAL) ×3
ELECTRODE REM PT RTRN 9FT ADLT (ELECTROSURGICAL) ×1 IMPLANT
GLOVE BIOGEL PI IND STRL 7.0 (GLOVE) ×1 IMPLANT
GLOVE BIOGEL PI INDICATOR 7.0 (GLOVE) ×2
GLOVE ECLIPSE 7.0 STRL STRAW (GLOVE) ×3 IMPLANT
GLOVE SKINSENSE NS SZ7.5 (GLOVE) ×4
GLOVE SKINSENSE STRL SZ7.5 (GLOVE) ×2 IMPLANT
GOWN STRL REIN XL XLG (GOWN DISPOSABLE) ×3 IMPLANT
GUIDE PIN 3.2X343 (PIN) ×1
GUIDE PIN 3.2X343MM (PIN) ×3
KIT BASIN OR (CUSTOM PROCEDURE TRAY) ×3 IMPLANT
KIT TURNOVER KIT B (KITS) ×3 IMPLANT
MANIFOLD NEPTUNE II (INSTRUMENTS) ×3 IMPLANT
NAIL TRIGEN INTERT 11.5X40-125 (Nail) ×3 IMPLANT
NS IRRIG 1000ML POUR BTL (IV SOLUTION) ×3 IMPLANT
PACK GENERAL/GYN (CUSTOM PROCEDURE TRAY) ×3 IMPLANT
PAD ARMBOARD 7.5X6 YLW CONV (MISCELLANEOUS) ×6 IMPLANT
PAD CAST 4YDX4 CTTN HI CHSV (CAST SUPPLIES) ×2 IMPLANT
PADDING CAST COTTON 4X4 STRL (CAST SUPPLIES) ×6
PIN GUIDE 3.2X343MM (PIN) ×1 IMPLANT
SCREW LAG COMPR KIT 95/90 (Screw) ×3 IMPLANT
STAPLER VISISTAT 35W (STAPLE) ×3 IMPLANT
SUT VIC AB 0 CT1 27 (SUTURE) ×2
SUT VIC AB 0 CT1 27XBRD ANBCTR (SUTURE) ×1 IMPLANT
SUT VIC AB 2-0 CT1 27 (SUTURE) ×3
SUT VIC AB 2-0 CT1 TAPERPNT 27 (SUTURE) ×1 IMPLANT
TOWEL OR 17X24 6PK STRL BLUE (TOWEL DISPOSABLE) ×3 IMPLANT
TOWEL OR 17X26 10 PK STRL BLUE (TOWEL DISPOSABLE) ×3 IMPLANT
WATER STERILE IRR 1000ML POUR (IV SOLUTION) ×3 IMPLANT

## 2018-07-17 NOTE — H&P (Signed)
History and Physical    Chad Mcmillan GYI:[id number] DOB: 03-13-1924 DOA: 07/17/2018  PCP: [name], MD   Patient coming from: Home   Chief Complaint: Fall with right hip pain   HPI: Chad Mcmillan is a 84 y.o. male with medical history significant for coronary artery disease, history of CVA, type 2 diabetes mellitus, hypertension, chronic kidney disease stage IV, chronic anemia, thrombocytopenia, and dementia, now presenting to the emergency department with severe right hip pain after fall at home.  The patient reports that he has been experiencing mild dyspnea and occasional cough, but no chest pain, fevers, or chills.  He was walking with his cane last night when he tripped and fell onto his right side, denies hitting his head or losing consciousness.  He was experiencing pain in the low back, and particularly in the right hip.  He was unable to get up due to the severe pain in his right hip.  His son, with whom he lives, called EMS and the patient was treated with fentanyl prior to arrival in the ED.  ED Course: Upon arrival to the ED, patient is found to be afebrile, saturating well on room air, and with blood pressure 180/80.  EKG features a sinus rhythm with first-degree AV nodal block and LVH with repolarization abnormality.  Radiographs of the right hip are concerning for mildly displaced acute fracture of the right femoral greater trochanter.  There is no acute fracture on lumbar spine radiographs.  Chest x-ray is notable for persistent airspace densities primarily involving the left lower lobe where there is interval increase in a pleural effusion suggestive of infection or neoplasia.  Noncontrast head CT is negative for acute intracranial abnormality.  Chemistry panel is notable for a creatinine of 2.18, similar to priors.  CBC features a microcytic anemia with hemoglobin of 8.4, down from the 9-11 range several months ago.  Platelets are clumped on the CBC.  Type and screen was performed,  fentanyl was administered, and orthopedic surgery was consulted by the ED physician with recommendation to admit to medicine, keep the patient n.p.o., and MRI the right hip.  Review of Systems:  All other systems reviewed and apart from HPI, are negative.  Past Medical History:  Diagnosis Date  . ADENOCARCINOMA, PROSTATE 06/01/2008  . CORONARY ARTERY DISEASE 06/13/2007  . CVA 06/01/2008   x 2  . DIABETES MELLITUS, TYPE I, WITH OPHTHALMIC COMPLICATIONS 3/50/0938  . 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 HYPERKALEMIA 06/01/2008   due to DM neparopathy  . HYPERLIPIDEMIA 06/13/2007  . HYPERTENSION 06/13/2007  . NEOPLASM, MALIGNANT, SKIN, FACE 06/01/2008  . Other chronic nonalcoholic liver disease 1/82/9937  . RENAL DISEASE 06/01/2008    Past Surgical History:  Procedure Laterality Date  . Carotid Duplex  09/12/2004  . CATARACT EXTRACTION    . CHOLECYSTECTOMY    . ESOPHAGOGASTRODUODENOSCOPY  06/28/1991  . HERNIA REPAIR     right Inguinal hernia  . Rest/Stress Cardiolite  01/22/2001     reports that he quit smoking about 39 years ago. He has never used smokeless tobacco. He reports that he does not drink alcohol or use drugs.  No Known Allergies  Family History  Problem Relation Age of Onset  . Vision loss Son      Prior to Admission medications   Medication Sig Start Date End Date Taking? Authorizing Provider  amLODipine (NORVASC) 5 MG tablet Take 1 tablet (5 mg total) by mouth daily. 06/30/18  Yes [name], MD  aspirin  EC 81 MG tablet Take 1 tablet (81 mg total) by mouth daily. 08/04/17  Yes [name], MD  atorvastatin (LIPITOR) 40 MG tablet Take 2 tablets (80 mg total) by mouth daily at 6 PM. 06/30/18   [name], MD  carvedilol (COREG) 6.25 MG tablet Take 1 tablet (6.25 mg total) by mouth 2 (two) times daily with a meal. 06/30/18   [name], MD  donepezil (ARICEPT) 5 MG tablet Take 1 tablet (5 mg total) by mouth at bedtime. 06/30/18   [name], MD  ergocalciferol (VITAMIN  D2) 50000 units capsule Take 1 capsule (50,000 Units total) by mouth 3 (three) times a week. 07/01/18   [name], MD  ferrous sulfate 325 (65 FE) MG tablet Take 1 tablet (325 mg total) by mouth daily with breakfast. Patient not taking: Reported on 06/15/2018 03/03/18   Purohit, Konrad Dolores, MD  lisinopril (PRINIVIL,ZESTRIL) 5 MG tablet Take 1 tablet (5 mg total) by mouth daily. 06/30/18   [name], MD  LORazepam (ATIVAN) 0.5 MG tablet Take 1 tablet (0.5 mg total) by mouth daily as needed for anxiety. 06/30/18   [name], MD  meclizine (ANTIVERT) 12.5 MG tablet TAKE 1 TABLET BY MOUTH THREE TIMES DAILY AS NEEDED FOR DIZZINESS 06/30/18   [name], MD  nitroGLYCERIN (NITROSTAT) 0.4 MG SL tablet Place 1 tablet (0.4 mg total) under the tongue every 5 (five) minutes as needed. For chest pain 08/04/17   [name], MD  repaglinide (PRANDIN) 0.5 MG tablet Take 1 tablet (0.5 mg total) by mouth 3 (three) times daily before meals. 06/30/18   [name], MD  vitamin B-12 1000 MCG tablet Take 1 tablet (1,000 mcg total) by mouth daily. 03/03/18   Cristy Folks, MD    Physical Exam: Vitals:   07/17/18 0400 07/17/18 0415 07/17/18 0430 07/17/18 0445  BP: (!) 187/67 (!) 157/74 (!) 175/68 (!) 166/70  Pulse: 80 80 78 76  Resp: (!) 28 18 (!) 24 16  Temp:      TempSrc:      SpO2: 96% 97% 97% 96%  Weight:      Height:         Constitutional: NAD, calm  Eyes: PERTLA, lids and conjunctivae normal ENMT: Mucous membranes are moist. Posterior pharynx clear of any exudate or lesions.   Neck: normal, supple, no masses, no thyromegaly Respiratory: Rhonchi on left, no wheezing. No accessory muscle use.  Cardiovascular: S1 & S2 heard, regular rate and rhythm. 2+ pretibial edema bilaterally. Abdomen: No distension, no tenderness, soft. Bowel sounds active.  Musculoskeletal: no clubbing / cyanosis. Tender at right hip, neurovascularly intact distally.    Skin: no significant rashes, lesions, ulcers.  Warm, dry, well-perfused. Neurologic: No gross facial asymmetry. Sensation intact. Moving all extremities.  Psychiatric: Alert and oriented to person, place, and situation. Very pleasant, cooperative.    Labs on Admission: I have personally reviewed following labs and imaging studies  CBC: Recent Labs  Lab 07/17/18 0318  WBC 7.5  NEUTROABS 5.8  HGB 8.4*  HCT 27.7*  MCV 102.6*  PLT PLATELET CLUMPS NOTED ON SMEAR, COUNT APPEARS DECREASED   Basic Metabolic Panel: Recent Labs  Lab 07/17/18 0318  NA 141  K 4.9  CL 114*  CO2 18*  GLUCOSE 126*  BUN 39*  CREATININE 2.18*  CALCIUM 8.5*   GFR: Estimated Creatinine Clearance: 23.7 mL/min (A) (by C-G formula based on SCr of 2.18 mg/dL (H)). Liver Function Tests: No results for input(s): AST,  ALT, ALKPHOS, BILITOT, PROT, ALBUMIN in the last 168 hours. No results for input(s): LIPASE, AMYLASE in the last 168 hours. No results for input(s): AMMONIA in the last 168 hours. Coagulation Profile: Recent Labs  Lab 07/17/18 0318  INR 1.13   Cardiac Enzymes: No results for input(s): CKTOTAL, CKMB, CKMBINDEX, TROPONINI in the last 168 hours. BNP (last 3 results) No results for input(s): PROBNP in the last 8760 hours. HbA1C: No results for input(s): HGBA1C in the last 72 hours. CBG: No results for input(s): GLUCAP in the last 168 hours. Lipid Profile: No results for input(s): CHOL, HDL, LDLCALC, TRIG, CHOLHDL, LDLDIRECT in the last 72 hours. Thyroid Function Tests: No results for input(s): TSH, T4TOTAL, FREET4, T3FREE, THYROIDAB in the last 72 hours. Anemia Panel: No results for input(s): VITAMINB12, FOLATE, FERRITIN, TIBC, IRON, RETICCTPCT in the last 72 hours. Urine analysis:    Component Value Date/Time   COLORURINE YELLOW 02/27/2018 1226   APPEARANCEUR CLEAR 02/27/2018 1226   LABSPEC 1.010 02/27/2018 1226   PHURINE 7.0 02/27/2018 1226   GLUCOSEU >=500 (A) 02/27/2018 1226   GLUCOSEU NEGATIVE 09/07/2014 1334   HGBUR SMALL  (A) 02/27/2018 1226   BILIRUBINUR NEGATIVE 02/27/2018 1226   KETONESUR 5 (A) 02/27/2018 1226   PROTEINUR >=300 (A) 02/27/2018 1226   UROBILINOGEN 0.2 09/07/2014 1334   NITRITE NEGATIVE 02/27/2018 1226   LEUKOCYTESUR NEGATIVE 02/27/2018 1226   Sepsis Labs: @LABRCNTIP (procalcitonin:4,lacticidven:4) )No results found for this or any previous visit (from the past 240 hour(s)).   Radiological Exams on Admission: Dg Chest 1 View  Result Date: 07/17/2018 CLINICAL DATA:  84 year old male with fall. EXAM: CHEST  1 VIEW COMPARISON:  Chest radiograph dated 02/27/2018 and CT dated 02/27/2018 FINDINGS: There is patchy area of airspace opacity in the right lower lung field and infrahilar region. An area of opacity is also noted in the left lower lobe/retrocardiac area. Although these may the infectious etiology, persistent and interval development of left-sided pleural effusion since the prior radiograph is concerning for underlying neoplasm involving the left lower lobe. Further evaluation with chest CT with IV contrast is recommended. There is a small left pleural effusion. No pneumothorax. There is mild cardiomegaly. Atherosclerotic calcification of the aortic arch. No acute osseous pathology. IMPRESSION: Persistent airspace densities primarily involving the left lower lobe as well as in the right lower lung field with interval development or increase in the size of the left-sided pleural effusion. Findings may be infectious in etiology but concerning for a neoplastic process in the left lower lobe. Further evaluation with CT with IV contrast is recommended. Electronically Signed   By: Anner Crete M.D.   On: 07/17/2018 03:33   Dg Lumbar Spine Complete  Result Date: 07/17/2018 CLINICAL DATA:  84 y/o  M; fall with right hip pain. EXAM: LUMBAR SPINE - COMPLETE 4+ VIEW COMPARISON:  01/12/2018 CT abdomen and pelvis FINDINGS: Five lumbar type non-rib-bearing vertebral bodies. Mild reverse S curvature of  lumbar spine. Normal lumbar lordosis without listhesis. Stable L2 moderate compression deformity. No new fracture identified. Calcific aortic atherosclerosis. IMPRESSION: Stable chronic moderate L2 compression deformity. No acute fracture identified. Aortic atherosclerosis. Electronically Signed   By: Kristine Garbe M.D.   On: 07/17/2018 03:33   Ct Head Wo Contrast  Result Date: 07/17/2018 CLINICAL DATA:  84 y/o  M; vertical, episodic, peripheral. EXAM: CT HEAD WITHOUT CONTRAST TECHNIQUE: Contiguous axial images were obtained from the base of the skull through the vertex without intravenous contrast. COMPARISON:  02/27/2018 CT head FINDINGS: Brain:  No evidence of acute infarction, hemorrhage, hydrocephalus, extra-axial collection or mass lesion/mass effect. Stable small chronic infarcts within the bilateral cerebellar hemispheres. Stable chronic microvascular ischemic changes and volume loss of the brain Vascular: Calcific atherosclerosis of the vertebral arteries and carotid siphons. No hyperdense vessel identified. Skull: Normal. Negative for fracture or focal lesion. Sinuses/Orbits: Mild ethmoid and maxillary sinus mucosal thickening. Normal aeration of mastoid air cells. Bilateral intra-ocular lens replacement. Other: None. IMPRESSION: 1. No acute intracranial abnormality identified. 2. Stable chronic microvascular ischemic changes and volume loss of the brain. Stable small chronic infarcts in the bilateral cerebellar hemispheres. Electronically Signed   By: Kristine Garbe M.D.   On: 07/17/2018 03:37   Dg Hip Unilat With Pelvis 2-3 Views Right  Result Date: 07/17/2018 CLINICAL DATA:  84 y/o  M; fall with right hip pain. EXAM: DG HIP (WITH OR WITHOUT PELVIS) 2-3V RIGHT COMPARISON:  None. FINDINGS: Lucency traversing the greater trochanter likely representing a mildly displaced fracture. No additional proximal femur fracture identified. No appreciable pelvic fracture or pelvic  diastasis. Vascular calcifications noted. IMPRESSION: Mildly displaced acute fracture of the right femur greater trochanter. No additional fracture identified. Consider further evaluation with right hip CT. Electronically Signed   By: Kristine Garbe M.D.   On: 07/17/2018 03:31    EKG: Independently reviewed. Sinus rhythm, 1st degree AV block, LVH with repolarization abnormality.   Assessment/Plan  1. Acute fracture of right greater trochanter  - Presents with severe right hip pain after a mechanical fall at home  - Radiographs suggest mildly displaced fracture of right greater trochanter  - Orthopedic surgery is consulting and much appreciated, recommending NPO and MRI hip for further evaluation  - MRI hip ordered; continue pain-control, keep NPO, follow-up platelet count and transfuse as needed  - Based on the available data, Chad Mcmillan presents an estimated 2% risk for perioperative MI or cardiac arrest per Melburn Hake al    2. Macrocytic anemia; thrombocytopenia   - Hgb is 8.4, lower than priors in 9-11 range  - He has hx of thrombocytopenia and platelets are clumped on admission CBC  - There is no active bleeding  - Type and screen has is done  - Repeat platelet count and if surgery is recommended by ortho, transfuse if <50k   - Continue B12 supplementation   3. Left pleural effusion; ?CAP  - Patient reports chronic cough and mild dyspnea  - CXR with airspace opacities bilaterally and left-sided pleural effusion  - There is no leukocytosis or fever, however there is cough and left-sided rhonchi on exam, could also be neoplastic  - Favor treating for pneumonia and recommend follow-up imaging as outpatient   - Check sputum culture and strep pneumo antigen, start Rocephin and azithromycin   4. CKD stage IV  - SCr is 2.18 on admission, similar to priors  - Renally-dose medications    5. Type II DM  - A1c was 6.4% in May  - Managed with Prandin at home, held on admission  -  Check CBGs and use a low-intensity SSI with Novolog while in hospital    6. Hypertension  - BP mildly elevated  - Continue pain-control, Norvasc, and Coreg  - Hold lisinopril for possible surgery   7. History of CVA  - Patient and family have not noted any focal deficit  - Head CT with no acute intracranial abnormality  - Continue Lipitor, hold ASA for possible surgery    8. Dementia  - Continue Aricept  DVT prophylaxis: SCD's  Code Status: Full  Family Communication: Son updated at bedside Consults called: Orthopedic surgery  Admission status: Inpatient     Vianne Bulls, MD [facility] Pager [phone]  If 7PM-7AM, please contact night-coverage [email or web address] Password [email or web address]  07/17/2018, 5:40 AM

## 2018-07-17 NOTE — Clinical Social Work Note (Signed)
Clinical Social Work Assessment  Patient Details  Name: Chad Mcmillan MRN: [id number] Date of Birth: Jun 19, 1924  Date of referral:  07/17/18               Reason for consult:  Facility Placement, Discharge Planning                Permission sought to share information with:  Family Supports Permission granted to share information::  Yes, Verbal Permission Granted  Name::     Dianah Field  Agency::  family   Relationship::  spouse   Contact Information:  Lorne Winkels [phone]  Housing/Transportation Living arrangements for the past 2 months:  Single Family Home(with wife. ) Source of Information:  Patient Patient Interpreter Needed:  None Criminal Activity/Legal Involvement Pertinent to Current Situation/Hospitalization:  No - Comment as needed Significant Relationships:  Adult Children, Spouse, Community Support Lives with:  Spouse Do you feel safe going back to the place where you live?  Yes Need for family participation in patient care:  Yes (Comment)  Care giving concerns: CSW consulted for possible SNF placement as a result of a fall.    Social Worker assessment / plan:  CSW spoke with pt at bedside. Pt expressed begin from home with wife. Pt reports that supports for pt include wife, children, as well as care taker that come sin the home and cleans pt's home and perform other daily household tasks. Pt reports that pt will consider SNF rehab as pt does feel that either he or his wife need something in order to help the other.   During this assessment pt was lying in bed and was calm. Pt expressed a chuckle and thanked CSW for speaking with pt.   Employment status:  Retired Forensic scientist:  Medicare PT Recommendations:  Not assessed at this time [place] / Referral to community resources:  [place]  Patient/Family's Response to care:  Pt appeared to be understanding and agreeable to plan of care at this time.   Patient/Family's Understanding of and  Emotional Response to Diagnosis, Current Treatment, and Prognosis:  No further questions or concerns have been presented to CSW at this time.   Emotional Assessment Appearance:  Appears stated age Attitude/Demeanor/Rapport:  Engaged Affect (typically observed):  Adaptable, Accepting, Appropriate, Pleasant Orientation:  Oriented to Situation, Oriented to Self, Oriented to Place, Oriented to  Time Alcohol / Substance use:  Not Applicable Psych involvement (Current and /or in the community):  No (Comment)  Discharge Needs  Concerns to be addressed:  Denies Needs/Concerns at this time Readmission within the last 30 days:  No Current discharge risk:  Other Barriers to Discharge:  Continued Medical Work up   [employer], [place] 07/17/2018, 7:23 AM

## 2018-07-17 NOTE — ED Notes (Signed)
Patient transported to MRI 

## 2018-07-17 NOTE — Anesthesia Postprocedure Evaluation (Signed)
Anesthesia Post Note  Patient: [name]  Procedure(s) Performed: INTRAMEDULLARY (IM) NAIL INTERTROCHANTRIC (N/A )     Patient location during evaluation: PACU Anesthesia Type: General Level of consciousness: awake and alert Pain management: pain level controlled Vital Signs Assessment: post-procedure vital signs reviewed and stable Respiratory status: spontaneous breathing, nonlabored ventilation, respiratory function stable and patient connected to nasal cannula oxygen Cardiovascular status: blood pressure returned to baseline and stable Postop Assessment: no apparent nausea or vomiting Anesthetic complications: no    Last Vitals:  Vitals:   07/17/18 1403 07/17/18 1424  BP: (!) 152/57 (!) 165/61  Pulse: (!) 59 (!) 52  Resp: 14   Temp: (!) 36.3 C   SpO2: 93% 92%    Last Pain:  Vitals:   07/17/18 1400  TempSrc:   PainSc: 0-No pain        RLE Motor Response: (P) Purposeful movement;Responds to commands (07/17/18 1440) RLE Sensation: (P) Full sensation;No pain (07/17/18 1440)      Tiajuana Amass

## 2018-07-17 NOTE — ED Triage Notes (Signed)
Pt bib [facility] from home s/p mechanical fall last night at approximately 2230.  Pt was unable to get up d/t pain and was on the floor until 0040 when EMS arrived.  Pt reports landing on right side.  C/o right hip pain that improved w/ pelvic binder.  Pt given 150 mcg of fentanyl en route.  Pt denied LOC or dizziness prior to fall.

## 2018-07-17 NOTE — Anesthesia Preprocedure Evaluation (Addendum)
Anesthesia Evaluation  Patient identified by MRN, date of birth, ID band Patient awake    Reviewed: Allergy & Precautions, NPO status , Patient's Chart, lab work & pertinent test results  Airway Mallampati: II  TM Distance: >3 FB Neck ROM: Full    Dental  (+) Dental Advisory Given   Pulmonary former smoker,    breath sounds clear to auscultation       Cardiovascular hypertension, Pt. on medications and Pt. on home beta blockers + CAD and + Peripheral Vascular Disease  + dysrhythmias  Rhythm:Regular Rate:Normal     Neuro/Psych Dementia CVA    GI/Hepatic negative GI ROS, (+) Hepatitis -  Endo/Other  diabetes  Renal/GU CRFRenal disease     Musculoskeletal   Abdominal   Peds  Hematology  (+) Blood dyscrasia (Thrombocytopenia), anemia ,   Anesthesia Other Findings   Reproductive/Obstetrics                            Lab Results  Component Value Date   WBC 7.5 07/17/2018   HGB 8.4 (L) 07/17/2018   HCT 27.7 (L) 07/17/2018   MCV 102.6 (H) 07/17/2018   PLT 74 (L) 07/17/2018   Lab Results  Component Value Date   CREATININE 2.18 (H) 07/17/2018   BUN 39 (H) 07/17/2018   NA 141 07/17/2018   K 4.9 07/17/2018   CL 114 (H) 07/17/2018   CO2 18 (L) 07/17/2018    Anesthesia Physical Anesthesia Plan  ASA: IV  Anesthesia Plan: General   Post-op Pain Management:    Induction: Intravenous  PONV Risk Score and Plan: 2 and Ondansetron, Dexamethasone and Treatment may vary due to age or medical condition  Airway Management Planned: Oral ETT  Additional Equipment:   Intra-op Plan:   Post-operative Plan: Extubation in OR and Possible Post-op intubation/ventilation  Informed Consent: I have reviewed the patients History and Physical, chart, labs and discussed the procedure including the risks, benefits and alternatives for the proposed anesthesia with the patient or authorized representative  who has indicated his/her understanding and acceptance.   Dental advisory given  Plan Discussed with: CRNA  Anesthesia Plan Comments:         Anesthesia Quick Evaluation

## 2018-07-17 NOTE — ED Provider Notes (Signed)
[facility] EMERGENCY DEPARTMENT Provider Note   CSN: [id number] Arrival date & time: 07/17/18  0143     History   Chief Complaint Chief Complaint  Patient presents with  . Hip Pain    HPI Chad Mcmillan is a 84 y.o. male.  The history is provided by the patient.  Hip Pain  This is a new problem. The current episode started 3 to 5 hours ago. The problem occurs constantly. The problem has been gradually worsening. Pertinent negatives include no chest pain, no abdominal pain, no headaches and no shortness of breath. Exacerbated by: movement. The symptoms are relieved by rest.  PT with history of CAD, stroke, dementia presents for fall.  He reports he has been having increasing falls over the past several months.  He reports he gets dizziness frequently.  Tonight he reports he was going to the bathroom when he fell landing on his right hip.  He reports he felt dizzy tonight. He has pain in his right hip, back.  He denies neck or back pain.  No chest pain or abdominal pain  Past Medical History:  Diagnosis Date  . ADENOCARCINOMA, PROSTATE 06/01/2008  . CORONARY ARTERY DISEASE 06/13/2007  . CVA 06/01/2008   x 2  . DIABETES MELLITUS, TYPE I, WITH OPHTHALMIC COMPLICATIONS 8/75/6433  . 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 HYPERKALEMIA 06/01/2008   due to DM neparopathy  . HYPERLIPIDEMIA 06/13/2007  . HYPERTENSION 06/13/2007  . NEOPLASM, MALIGNANT, SKIN, FACE 06/01/2008  . Other chronic nonalcoholic liver disease 2/95/1884  . RENAL DISEASE 06/01/2008    Patient Active Problem List   Diagnosis Date Noted  . HCAP (healthcare-associated pneumonia) 02/27/2018  . Hypocalcemia 02/12/2018  . Agitation   . Dementia with behavioral disturbance   . Palliative care by specialist   . CKD (chronic kidney disease), stage III ([place]) 01/14/2018  . Toxic encephalopathy 01/12/2018  . HAP (hospital-acquired pneumonia) 01/12/2018  . AKI (acute kidney injury) ([place]) 01/12/2018  . Inguinal hernia of  right side without obstruction or gangrene 01/12/2018  . Acute renal failure superimposed on stage 3 chronic kidney disease ([place]) 01/07/2018  . Vertigo 01/07/2018  . LBBB (left bundle branch block) 01/07/2018  . Generalized weakness 01/07/2018  . Acute metabolic encephalopathy 16/60/6301  . Dehydration   . Foot pain, bilateral 10/07/2017  . Memory loss 04/07/2017  . Numbness 03/06/2016  . Dizzy 01/31/2016  . Medication side effect 03/08/2015  . Pancytopenia ([place]) 07/27/2012  . Goals of care, counseling/discussion 07/27/2012  . DM (diabetes mellitus) ([place]) 07/27/2012  . Anemia 07/27/2012  . Encounter for long-term (current) use of other medications 07/23/2011  . NASH (nonalcoholic steatohepatitis) 07/17/2010  . COUGH 02/06/2010  . PNEUMONIA, ORGANISM UNSPECIFIED 11/27/2009  . NEOPLASM, MALIGNANT, SKIN, FACE 06/01/2008  . ADENOCARCINOMA, PROSTATE 06/01/2008  . HYPERKALEMIA 06/01/2008  . Stroke (cerebrum) ([place]) 06/01/2008  . Disorder of kidney and ureter 06/01/2008  . NEOPLASM, MALIGNANT, SKIN, FACE 06/01/2008  . Dyslipidemia 06/13/2007  . Essential hypertension 06/13/2007  . Coronary atherosclerosis 06/13/2007    Past Surgical History:  Procedure Laterality Date  . Carotid Duplex  09/12/2004  . CATARACT EXTRACTION    . CHOLECYSTECTOMY    . ESOPHAGOGASTRODUODENOSCOPY  06/28/1991  . HERNIA REPAIR     right Inguinal hernia  . Rest/Stress Cardiolite  01/22/2001        Home Medications    Prior to Admission medications   Medication Sig Start Date End Date Taking? Authorizing Provider  amLODipine (NORVASC) 5 MG  tablet Take 1 tablet (5 mg total) by mouth daily. 06/30/18  Yes [name], MD  aspirin EC 81 MG tablet Take 1 tablet (81 mg total) by mouth daily. 08/04/17  Yes [name], MD  atorvastatin (LIPITOR) 40 MG tablet Take 2 tablets (80 mg total) by mouth daily at 6 PM. 06/30/18   [name], MD  carvedilol (COREG) 6.25 MG tablet Take 1 tablet (6.25 mg total) by  mouth 2 (two) times daily with a meal. 06/30/18   [name], MD  carvedilol (COREG) 6.25 MG tablet TAKE 1 TABLET BY MOUTH TWICE DAILY WITH A MEAL 07/12/18   [name], MD  donepezil (ARICEPT) 5 MG tablet Take 1 tablet (5 mg total) by mouth at bedtime. 06/30/18   [name], MD  ergocalciferol (VITAMIN D2) 50000 units capsule Take 1 capsule (50,000 Units total) by mouth 3 (three) times a week. 07/01/18   [name], MD  ferrous sulfate 325 (65 FE) MG tablet Take 1 tablet (325 mg total) by mouth daily with breakfast. Patient not taking: Reported on 06/15/2018 03/03/18   Purohit, Konrad Dolores, MD  lisinopril (PRINIVIL,ZESTRIL) 5 MG tablet Take 1 tablet (5 mg total) by mouth daily. 06/30/18   [name], MD  LORazepam (ATIVAN) 0.5 MG tablet Take 1 tablet (0.5 mg total) by mouth daily as needed for anxiety. 06/30/18   [name], MD  meclizine (ANTIVERT) 12.5 MG tablet TAKE 1 TABLET BY MOUTH THREE TIMES DAILY AS NEEDED FOR DIZZINESS 06/30/18   [name], MD  nitroGLYCERIN (NITROSTAT) 0.4 MG SL tablet Place 1 tablet (0.4 mg total) under the tongue every 5 (five) minutes as needed. For chest pain 08/04/17   [name], MD  repaglinide (PRANDIN) 0.5 MG tablet Take 1 tablet (0.5 mg total) by mouth 3 (three) times daily before meals. 06/30/18   [name], MD  vitamin B-12 1000 MCG tablet Take 1 tablet (1,000 mcg total) by mouth daily. 03/03/18   Purohit, Konrad Dolores, MD    Family History No family history on file.  Social History Social History   Tobacco Use  . Smoking status: Former Smoker    Last attempt to quit: 11/18/1978    Years since quitting: 39.6  . Smokeless tobacco: Never Used  Substance Use Topics  . Alcohol use: No  . Drug use: No     Allergies   Patient has no known allergies.   Review of Systems Review of Systems  Constitutional: Negative for fever.  Respiratory: Negative for shortness of breath.   Cardiovascular: Negative for chest pain.  Gastrointestinal:  Negative for abdominal pain.  Musculoskeletal: Positive for arthralgias and back pain. Negative for neck pain.  Neurological: Positive for dizziness. Negative for headaches.  All other systems reviewed and are negative.    Physical Exam Updated Vital Signs BP (!) 171/67 (BP Location: Right Arm)   Pulse 71   Temp 97.6 F (36.4 C) (Oral)   Resp 19   Ht 1.829 m (6')   Wt 80.7 kg   SpO2 99%   BMI 24.14 kg/m   Physical Exam  CONSTITUTIONAL: Elderly, no acute distress HEAD: Normocephalic/atraumatic EYES: EOMI/PERRL ENMT: Mucous membranes moist NECK: supple no meningeal signs SPINE/BACK: no Cervical or thoracic tenderness, lumbar tenderness, no bruising/crepitance/stepoffs noted to spine CV: S1/S2 noted, no murmurs/rubs/gallops noted LUNGS: Lungs are clear to auscultation bilaterally, no apparent distress ABDOMEN: soft, nontender GU:no cva tenderness NEURO: Pt is awake/alert/appropriate, moves all extremitiesx4.  No facial droop.  No arm drift,  no facial droop EXTREMITIES: Pelvis stable, tenderness with range of motion of right hip, all other extremities/joints palpated/ranged and nontender pulses normal/equal SKIN: warm, color normal PSYCH: no abnormalities of mood noted, alert and oriented to situation  ED Treatments / Results  Labs (all labs ordered are listed, but only abnormal results are displayed) Labs Reviewed  BASIC METABOLIC PANEL - Abnormal; Notable for the following components:      Result Value   Chloride 114 (*)    CO2 18 (*)    Glucose, Bld 126 (*)    BUN 39 (*)    Creatinine, Ser 2.18 (*)    Calcium 8.5 (*)    GFR calc non Af Amer 25 (*)    GFR calc Af Amer 29 (*)    All other components within normal limits  CBC WITH DIFFERENTIAL/PLATELET - Abnormal; Notable for the following components:   RBC 2.70 (*)    Hemoglobin 8.4 (*)    HCT 27.7 (*)    MCV 102.6 (*)    All other components within normal limits  PROTIME-INR  URINALYSIS, ROUTINE W REFLEX  MICROSCOPIC  TYPE AND SCREEN  ABO/RH    EKG EKG Interpretation  Date/Time:  Friday July 17 2018 02:12:08 EDT Ventricular Rate:  67 PR Interval:    QRS Duration: 152 QT Interval:  462 QTC Calculation: 488 R Axis:   -8 Text Interpretation:  Sinus rhythm Prolonged PR interval IVCD, consider atypical RBBB Left ventricular hypertrophy Anterior Q waves, possibly due to LVH Abnormal ekg No significant change since last tracing Confirmed by Ripley Fraise [phone]) on 07/17/2018 2:54:59 AM   Radiology Dg Chest 1 View  Result Date: 07/17/2018 CLINICAL DATA:  84 year old male with fall. EXAM: CHEST  1 VIEW COMPARISON:  Chest radiograph dated 02/27/2018 and CT dated 02/27/2018 FINDINGS: There is patchy area of airspace opacity in the right lower lung field and infrahilar region. An area of opacity is also noted in the left lower lobe/retrocardiac area. Although these may the infectious etiology, persistent and interval development of left-sided pleural effusion since the prior radiograph is concerning for underlying neoplasm involving the left lower lobe. Further evaluation with chest CT with IV contrast is recommended. There is a small left pleural effusion. No pneumothorax. There is mild cardiomegaly. Atherosclerotic calcification of the aortic arch. No acute osseous pathology. IMPRESSION: Persistent airspace densities primarily involving the left lower lobe as well as in the right lower lung field with interval development or increase in the size of the left-sided pleural effusion. Findings may be infectious in etiology but concerning for a neoplastic process in the left lower lobe. Further evaluation with CT with IV contrast is recommended. Electronically Signed   By: Anner Crete M.D.   On: 07/17/2018 03:33   Dg Lumbar Spine Complete  Result Date: 07/17/2018 CLINICAL DATA:  84 y/o  M; fall with right hip pain. EXAM: LUMBAR SPINE - COMPLETE 4+ VIEW COMPARISON:  01/12/2018 CT abdomen and pelvis  FINDINGS: Five lumbar type non-rib-bearing vertebral bodies. Mild reverse S curvature of lumbar spine. Normal lumbar lordosis without listhesis. Stable L2 moderate compression deformity. No new fracture identified. Calcific aortic atherosclerosis. IMPRESSION: Stable chronic moderate L2 compression deformity. No acute fracture identified. Aortic atherosclerosis. Electronically Signed   By: Kristine Garbe M.D.   On: 07/17/2018 03:33   Ct Head Wo Contrast  Result Date: 07/17/2018 CLINICAL DATA:  84 y/o  M; vertical, episodic, peripheral. EXAM: CT HEAD WITHOUT CONTRAST TECHNIQUE: Contiguous axial images were obtained from the  base of the skull through the vertex without intravenous contrast. COMPARISON:  02/27/2018 CT head FINDINGS: Brain: No evidence of acute infarction, hemorrhage, hydrocephalus, extra-axial collection or mass lesion/mass effect. Stable small chronic infarcts within the bilateral cerebellar hemispheres. Stable chronic microvascular ischemic changes and volume loss of the brain Vascular: Calcific atherosclerosis of the vertebral arteries and carotid siphons. No hyperdense vessel identified. Skull: Normal. Negative for fracture or focal lesion. Sinuses/Orbits: Mild ethmoid and maxillary sinus mucosal thickening. Normal aeration of mastoid air cells. Bilateral intra-ocular lens replacement. Other: None. IMPRESSION: 1. No acute intracranial abnormality identified. 2. Stable chronic microvascular ischemic changes and volume loss of the brain. Stable small chronic infarcts in the bilateral cerebellar hemispheres. Electronically Signed   By: Kristine Garbe M.D.   On: 07/17/2018 03:37   Dg Hip Unilat With Pelvis 2-3 Views Right  Result Date: 07/17/2018 CLINICAL DATA:  84 y/o  M; fall with right hip pain. EXAM: DG HIP (WITH OR WITHOUT PELVIS) 2-3V RIGHT COMPARISON:  None. FINDINGS: Lucency traversing the greater trochanter likely representing a mildly displaced fracture. No  additional proximal femur fracture identified. No appreciable pelvic fracture or pelvic diastasis. Vascular calcifications noted. IMPRESSION: Mildly displaced acute fracture of the right femur greater trochanter. No additional fracture identified. Consider further evaluation with right hip CT. Electronically Signed   By: Kristine Garbe M.D.   On: 07/17/2018 03:31    Procedures Procedures (including critical care time)  Medications Ordered in ED Medications  fentaNYL (SUBLIMAZE) injection 50 mcg (50 mcg Intravenous Given 07/17/18 0456)     Initial Impression / Assessment and Plan / ED Course  I have reviewed the triage vital signs and the nursing notes.  Pertinent labs & imaging results that were available during my care of the patient were reviewed by me and considered in my medical decision making (see chart for details).     3:04 AM Pt reports increasing dizziness and falls.  I am concerned for right hip fracture.  I am also imaging his head, as he reports increased dizziness to ensure there is no obvious brain abnormality 5:00 AM Pt found to have probable right greater troch fracture Discuussed with Dr. Marlou Sa with orthopedics He recommends admission to hospitalist, keep n.p.o., MRI right hip.  Discussed with patient and son, he has no contraindications to MRI. Discussed  with Dr. Myna Hidalgo for admission. Also noted to be dehydrated.  He also has anemia.  He will  also need follow-up on his abnormal chest x-ray Final Clinical Impressions(s) / ED Diagnoses   Final diagnoses:  Closed fracture of trochanter of right femur, initial encounter ([place])  AKI (acute kidney injury) [facility])    ED Discharge Orders    None       Ripley Fraise, MD 07/17/18 [facility] [place]

## 2018-07-17 NOTE — Progress Notes (Signed)
Report given to Sam, RN in the Same Day surgery.

## 2018-07-17 NOTE — ED Notes (Signed)
Patient transported to CT 

## 2018-07-17 NOTE — Op Note (Signed)
   Date of Surgery: 07/17/2018  INDICATIONS: Chad Mcmillan is a 84 y.o.-year-old male who sustained a right hip fracture. The risks and benefits of the procedure discussed with the patient prior to the procedure and all questions were answered; consent was obtained.  PREOPERATIVE DIAGNOSIS: right intertrochanteric hip fracture   POSTOPERATIVE DIAGNOSIS: Same   PROCEDURE: Treatment of intertrochanteric fracture with intramedullary implant. CPT [phone]   SURGEON: N. Eduard Roux, M.D.   ASSIST: Ciro Backer [place], [place]; necessary for the timely completion of procedure and due to complexity of procedure.  ANESTHESIA: general   IV FLUIDS AND URINE: See anesthesia record   ESTIMATED BLOOD LOSS: 200 cc  IMPLANTS: [employer] InterTAN 11.5 x 40  DRAINS: None.   COMPLICATIONS: None.   DESCRIPTION OF PROCEDURE: The patient was brought to the operating room and placed supine on the operating table. The patient's leg had been signed prior to the procedure. The patient had the anesthesia placed by the anesthesiologist. The prep verification and incision time-outs were performed to confirm that this was the correct patient, site, side and location. The patient had an SCD on the opposite lower extremity. The patient did receive antibiotics prior to the incision and was re-dosed during the procedure as needed at indicated intervals. The patient was positioned on the fracture table with the table in traction and internal rotation. The well leg was placed in a scissor position and all bony prominences were well-padded. The patient had the lower extremity prepped and draped in the standard surgical fashion. The incision was made 4 finger breadths superior to the greater trochanter. A guide pin was inserted into the tip of the greater trochanter under fluoroscopic guidance. An opening reamer was used to gain access to the femoral canal. The nail length was measured and inserted down the femoral canal to its  proper depth. The appropriate version of insertion for the lag screw was found under fluoroscopy. A pin was inserted up the femoral neck through the jig. Then, a second antirotation pin was inserted inferior to the first pin. The length of the lag screw was then measured. The lag screw was inserted as near to center-center in the head as possible. The antirotation pin was then taken out and an interdigitating compression screw was placed in its place.  The wound was copiously irrigated with saline and the subcutaneous layer closed with 2.0 vicryl and the skin was reapproximated with staples. The wounds were cleaned and dried a final time and a sterile dressing was placed. The hip was taken through a range of motion at the end of the case under fluoroscopic imaging to visualize the approach-withdraw phenomenon and confirm implant length in the head. The patient was then awakened from anesthesia and taken to the recovery room in stable condition. All counts were correct at the end of the case.   POSTOPERATIVE PLAN: The patient will be weight bearing as tolerated and will return in 2 weeks for staple removal and the patient will receive DVT prophylaxis based on other medications, activity level, and risk ratio of bleeding to thrombosis.   Azucena Cecil, MD [place] 12:57 PM

## 2018-07-17 NOTE — Progress Notes (Signed)
PROGRESS NOTE    Chad Mcmillan  ONG:[id number] DOB: 1923-11-22 DOA: 07/17/2018 PCP: [name], MD    Brief Narrative by Dr Myna Hidalgo: Chad Mcmillan is a 84 y.o. male with medical history significant for coronary artery disease, history of CVA, type 2 diabetes mellitus, hypertension, chronic kidney disease stage IV, chronic anemia, thrombocytopenia, and dementia, now presenting to the emergency department with severe right hip pain after fall at home.  The patient reports that he has been experiencing mild dyspnea and occasional cough, but no chest pain, fevers, or chills.  He was walking with his cane last night when he tripped and fell onto his right side, denies hitting his head or losing consciousness.  He was experiencing pain in the low back, and particularly in the right hip.  He was unable to get up due to the severe pain in his right hip.  His son, with whom he lives, called EMS and the patient was treated with fentanyl prior to arrival in the ED.  ED Course: Upon arrival to the ED, patient is found to be afebrile, saturating well on room air, and with blood pressure 180/80.  EKG features a sinus rhythm with first-degree AV nodal block and LVH with repolarization abnormality.  Radiographs of the right hip are concerning for mildly displaced acute fracture of the right femoral greater trochanter.  There is no acute fracture on lumbar spine radiographs.  Chest x-ray is notable for persistent airspace densities primarily involving the left lower lobe where there is interval increase in a pleural effusion suggestive of infection or neoplasia.  Noncontrast head CT is negative for acute intracranial abnormality.  Chemistry panel is notable for a creatinine of 2.18, similar to priors.  CBC features a microcytic anemia with hemoglobin of 8.4, down from the 9-11 range several months ago.  Platelets are clumped on the CBC.  Type and screen was performed, fentanyl was administered, and orthopedic surgery was  consulted by the ED physician with recommendation to admit to medicine, keep the patient n.p.o., and MRI the right hip.''  Assessment & Plan:   Principal Problem:   Displaced fracture of greater trochanter of right femur, initial encounter for closed fracture [facility]) Active Problems:   Essential hypertension   History of stroke   DM (diabetes mellitus) ([place])   Anemia   CKD (chronic kidney disease), stage IV ([place])   Dementia   Closed displaced fracture of greater trochanter of right femur ([place])   History of thrombocytopenia   Pleural effusion, left   Abnormal finding on chest xray   CAP (community acquired pneumonia)   1-Acute fracture of right greater trochanter;  Mechanical fall at home.  X ray; suggest mildly displaced fracture of right greater trochanter  Underwent to surgery 8-30. Intramedullary implant.    Anemia, thrombocytopenia;  Hb ranges 9--11.  Has history of thrombocytopenia.  Continue with B 12 supplementation.  History of thrombocytopenia. But 3 month ago platelet were normal.  Repeat labs in am.   Left pleural effusion ? PNA;  - CXR with airspace opacities bilaterally and left-sided pleural effusion  -antibiotics trial.  -needs follow up chest x ray to document resolution.  Continue with antibiotics.   CKD stage  IV;  Cr baseline 2.8.  Repeat labs in am.   DM type II;  Hold prandin.  SSI.   HTN;  Continue with coreg, Norvasc.  [place] hydralazine.   History of CVA;  Continue with lipitor. Aspirin on hold for sx.   Dementia;  continue with Aricept.      DVT prophylaxis: SCD Code Status: full code.  Family Communication: no family at bedside.  Disposition Plan:  He will need rehab.   Consultants:   Ortho    Procedures:   Sx, hip repair    Antimicrobials:   Prophylaxis.    Subjective: He is alert, pain right hip is controlled.    Objective: Vitals:   07/17/18 0801 07/17/18 1301 07/17/18 1315 07/17/18 1330  BP: (!) 174/77 (!)  144/54 (!) 155/62 (!) 156/52  Pulse: 82 70 (!) 57 (!) 56  Resp:  15 12 12   Temp: 97.9 F (36.6 C) (!) 97.3 F (36.3 C)    TempSrc: Oral     SpO2: 95% 94% 98% 99%  Weight:      Height:        Intake/Output Summary (Last 24 hours) at 07/17/2018 1346 Last data filed at 07/17/2018 1249 Gross per 24 hour  Intake 600 ml  Output 230 ml  Net 370 ml   Filed Weights   07/17/18 0158  Weight: 80.7 kg    Examination:  General exam: Appears calm and comfortable  Respiratory system: Clear to auscultation. Respiratory effort normal. Cardiovascular system: S1 & S2 heard, RRR. No JVD, murmurs, rubs, gallops or clicks. No pedal edema. Gastrointestinal system: Abdomen is nondistended, soft and nontender. No organomegaly or masses felt. Normal bowel sounds heard. Central nervous system: Alert and oriented. No focal neurological deficits. Extremities: left LE with dressing  Skin: No rashes, lesions or ulcers   Data Reviewed: I have personally reviewed following labs and imaging studies  CBC: Recent Labs  Lab 07/17/18 0318 07/17/18 0812  WBC 7.5  --   NEUTROABS 5.8  --   HGB 8.4*  --   HCT 27.7*  --   MCV 102.6*  --   PLT PLATELET CLUMPS NOTED ON SMEAR, COUNT APPEARS DECREASED 74*   Basic Metabolic Panel: Recent Labs  Lab 07/17/18 0318  NA 141  K 4.9  CL 114*  CO2 18*  GLUCOSE 126*  BUN 39*  CREATININE 2.18*  CALCIUM 8.5*   GFR: Estimated Creatinine Clearance: 23.7 mL/min (A) (by C-G formula based on SCr of 2.18 mg/dL (H)). Liver Function Tests: No results for input(s): AST, ALT, ALKPHOS, BILITOT, PROT, ALBUMIN in the last 168 hours. No results for input(s): LIPASE, AMYLASE in the last 168 hours. No results for input(s): AMMONIA in the last 168 hours. Coagulation Profile: Recent Labs  Lab 07/17/18 0318  INR 1.13   Cardiac Enzymes: No results for input(s): CKTOTAL, CKMB, CKMBINDEX, TROPONINI in the last 168 hours. BNP (last 3 results) No results for input(s): PROBNP  in the last 8760 hours. HbA1C: No results for input(s): HGBA1C in the last 72 hours. CBG: Recent Labs  Lab 07/17/18 0815 07/17/18 1102 07/17/18 1305  GLUCAP 114* 128* 127*   Lipid Profile: No results for input(s): CHOL, HDL, LDLCALC, TRIG, CHOLHDL, LDLDIRECT in the last 72 hours. Thyroid Function Tests: No results for input(s): TSH, T4TOTAL, FREET4, T3FREE, THYROIDAB in the last 72 hours. Anemia Panel: No results for input(s): VITAMINB12, FOLATE, FERRITIN, TIBC, IRON, RETICCTPCT in the last 72 hours. Sepsis Labs: No results for input(s): PROCALCITON, LATICACIDVEN in the last 168 hours.  No results found for this or any previous visit (from the past 240 hour(s)).       Radiology Studies: Dg Chest 1 View  Result Date: 07/17/2018 CLINICAL DATA:  84 year old male with fall. EXAM: CHEST  1 VIEW COMPARISON:  Chest radiograph dated 02/27/2018 and CT dated 02/27/2018 FINDINGS: There is patchy area of airspace opacity in the right lower lung field and infrahilar region. An area of opacity is also noted in the left lower lobe/retrocardiac area. Although these may the infectious etiology, persistent and interval development of left-sided pleural effusion since the prior radiograph is concerning for underlying neoplasm involving the left lower lobe. Further evaluation with chest CT with IV contrast is recommended. There is a small left pleural effusion. No pneumothorax. There is mild cardiomegaly. Atherosclerotic calcification of the aortic arch. No acute osseous pathology. IMPRESSION: Persistent airspace densities primarily involving the left lower lobe as well as in the right lower lung field with interval development or increase in the size of the left-sided pleural effusion. Findings may be infectious in etiology but concerning for a neoplastic process in the left lower lobe. Further evaluation with CT with IV contrast is recommended. Electronically Signed   By: Anner Crete M.D.   On:  07/17/2018 03:33   Dg Lumbar Spine Complete  Result Date: 07/17/2018 CLINICAL DATA:  84 y/o  M; fall with right hip pain. EXAM: LUMBAR SPINE - COMPLETE 4+ VIEW COMPARISON:  01/12/2018 CT abdomen and pelvis FINDINGS: Five lumbar type non-rib-bearing vertebral bodies. Mild reverse S curvature of lumbar spine. Normal lumbar lordosis without listhesis. Stable L2 moderate compression deformity. No new fracture identified. Calcific aortic atherosclerosis. IMPRESSION: Stable chronic moderate L2 compression deformity. No acute fracture identified. Aortic atherosclerosis. Electronically Signed   By: Kristine Garbe M.D.   On: 07/17/2018 03:33   Ct Head Wo Contrast  Result Date: 07/17/2018 CLINICAL DATA:  84 y/o  M; vertical, episodic, peripheral. EXAM: CT HEAD WITHOUT CONTRAST TECHNIQUE: Contiguous axial images were obtained from the base of the skull through the vertex without intravenous contrast. COMPARISON:  02/27/2018 CT head FINDINGS: Brain: No evidence of acute infarction, hemorrhage, hydrocephalus, extra-axial collection or mass lesion/mass effect. Stable small chronic infarcts within the bilateral cerebellar hemispheres. Stable chronic microvascular ischemic changes and volume loss of the brain Vascular: Calcific atherosclerosis of the vertebral arteries and carotid siphons. No hyperdense vessel identified. Skull: Normal. Negative for fracture or focal lesion. Sinuses/Orbits: Mild ethmoid and maxillary sinus mucosal thickening. Normal aeration of mastoid air cells. Bilateral intra-ocular lens replacement. Other: None. IMPRESSION: 1. No acute intracranial abnormality identified. 2. Stable chronic microvascular ischemic changes and volume loss of the brain. Stable small chronic infarcts in the bilateral cerebellar hemispheres. Electronically Signed   By: Kristine Garbe M.D.   On: 07/17/2018 03:37   Mr Hip Right Wo Contrast  Result Date: 07/17/2018 CLINICAL DATA:  Hip fracture EXAM: MR OF  THE RIGHT HIP WITHOUT CONTRAST TECHNIQUE: Multiplanar, multisequence MR imaging was performed. No intravenous contrast was administered. COMPARISON:  07/17/2018 radiograph FINDINGS: Bones: Right hip intertrochanteric fracture with intersecting fracture extending transversely through the greater trochanter. The intertrochanteric portion is relatively nondisplaced. Thin band of accentuated T2 and low T1 signal in the vicinity of the anterior acetabular wall/lateral base of the superior pubic ramus as shown on images 2-5 of series 17, mild stress fracture is a distinct possibility but this is not felt to represent a completed fracture. No concomitant fracture of the inferior pubic ramus is identified. Presacral edema noted. The sacrum is not fully included on any of the sequences, and it is difficult to exclude a small transverse fracture at the level of the sacrococcygeal junction. Articular cartilage and labrum Articular cartilage:  No focal chondral defect. Labrum: Small anterior superior labral  tear on images 18-19 of series 15. This is more prominent than I would expect for the normal recess. I also suspect a contralateral (left-sided) labral tear with paralabral cyst. Joint or bursal effusion Joint effusion:  Small right and trace left joint effusions. Bursae: Notable fluid in the right trochanteric bursa adjacent to the greater trochanteric fracture. There is also mild contralateral (left-sided) trochanteric bursitis. Muscles and tendons Muscles and tendons: Notable edema within along the gluteus medius, gluteus maximus, quadratus femoris, obturator externus, right hip adductor musculature, distal iliopsoas, and proximal vastus lateralis muscles. There is some lesser edema in the proximal semitendinosus muscles bilaterally and in the right gluteus maximus. Note is made of hip adductor muscular edema on the contralateral, left side. There is also a small amount of edema tracking below the right iliacus muscle.  Other findings Miscellaneous: There is potentially some low T2 signal in the left prostate peripheral zone on image 15/13. Sigmoid colon diverticulosis. Indirect right inguinal hernia contains loops of bowel extending down into the scrotum. Separate small bilateral scrotal hydroceles. IMPRESSION: 1. Acute intertrochanteric fracture of the right hip, with intersecting transverse greater trochanteric fracture. 2. Potential mild stress fracture/stress injury in the anterior wall of the right acetabulum. 3. Questionable transverse fracture of the sacrum near the sacrococcygeal junction. There is some presacral edema in this vicinity and questionable osseous edema. Today's exam was not optimized to assess the sacrum and the sacrum is only seen in a very disadvantageous manner which reduces positive predictive value. 4. Abnormal edema in multiple muscle groups in the vicinity of the right hip as described above. Considerable right trochanteric bursitis some of which is likely posttraumatic. Small right and trace left hip joint effusions. 5. Bilateral acetabular labral tears. 6. Left-sided (contralateral) trochanteric bursitis. 7. Low T2 signal in the left peripheral zone of the prostate gland noted. This could be postinflammatory but can also be from prostate cancer. Correlate with PSA level. 8. Indirect right inguinal hernia contains loops of bowel extending down into the scrotum. Separate small bilateral scrotal hydroceles. Electronically Signed   By: Van Clines M.D.   On: 07/17/2018 08:18   Dg Hip Unilat With Pelvis 2-3 Views Right  Result Date: 07/17/2018 CLINICAL DATA:  84 y/o  M; fall with right hip pain. EXAM: DG HIP (WITH OR WITHOUT PELVIS) 2-3V RIGHT COMPARISON:  None. FINDINGS: Lucency traversing the greater trochanter likely representing a mildly displaced fracture. No additional proximal femur fracture identified. No appreciable pelvic fracture or pelvic diastasis. Vascular calcifications noted.  IMPRESSION: Mildly displaced acute fracture of the right femur greater trochanter. No additional fracture identified. Consider further evaluation with right hip CT. Electronically Signed   By: Kristine Garbe M.D.   On: 07/17/2018 03:31        Scheduled Meds: . [MAR Hold] amLODipine  5 mg Oral Daily  . [MAR Hold] atorvastatin  80 mg Oral q1800  . [MAR Hold] carvedilol  6.25 mg Oral BID WC  . [MAR Hold] donepezil  5 mg Oral QHS  . [MAR Hold] insulin aspart  0-9 Units Subcutaneous Q4H  . povidone-iodine  2 application Topical Once  . [MAR Hold] cyanocobalamin  1,000 mcg Oral Daily   Continuous Infusions: . [MAR Hold] sodium chloride    . sodium chloride    . [MAR Hold] azithromycin    . [MAR Hold] cefTRIAXone (ROCEPHIN)  IV    . [MAR Hold] methocarbamol (ROBAXIN) IV       LOS: 0 days    Time  spent: 35 minutes.     Elmarie Shiley, MD [facility] Pager [phone]  If 7PM-7AM, please contact night-coverage [email or web address] Password [email or web address] 07/17/2018, 1:46 PM

## 2018-07-17 NOTE — Progress Notes (Addendum)
Initial Nutrition Assessment  DOCUMENTATION CODES:   Not applicable  INTERVENTION:    Advance diet as medically appropriate; add supplements as needed  NUTRITION DIAGNOSIS:   Increased nutrient needs related to hip fracture, post-op healing as evidenced by estimated needs  GOAL:   Patient will meet greater than or equal to 90% of their needs  MONITOR:   Diet advancement, PO intake, Labs, Skin, Weight trends, I & O's  REASON FOR ASSESSMENT:   Consult Hip fracture protocol  ASSESSMENT:   84 yo Male with PMH of CAD, stroke and dementia; presented s/p mechanical fall at home.   Pt admitted for AKI (acute kidney injury) (Chad Mcmillan) [N17.9] Closed fracture of trochanter of right femur, initial encounter (Chad Mcmillan) [S72.101A].  He is currently in out of his room.  In OR for IMN procedure. Ortho notes reviewed. Labs and medications reviewed. CBG's 114-128.  NUTRITION - FOCUSED PHYSICAL EXAM:  Unable to complete at this time.  Diet Order:   Diet Order            Diet NPO time specified  Diet effective now             EDUCATION NEEDS:   Not appropriate for education at this time  Skin:  Skin Assessment: Reviewed RN Assessment  Last BM:  PTA  Height:   Ht Readings from Last 1 Encounters:  07/17/18 6' (1.829 m)   Weight:   Wt Readings from Last 1 Encounters:  07/17/18 80.7 kg   Ideal Body Weight:  81 kg  BMI:  Body mass index is 24.14 kg/m.  Estimated Nutritional Needs:   Kcal:  1650-1850  Protein:  80-95 gm  Fluid:  1.6-1.8 L  Arthur Holms, RD, LDN Pager #: [phone] After-Hours Pager #: [phone]

## 2018-07-17 NOTE — Consult Note (Signed)
Reason for Consult:Hip fx Referring Physician: B Regalado  Chad Mcmillan is an 84 y.o. male.  HPI: Kainen fell at home after tripping. He fell onto his right side. He had immediate pain and couldn't get up. He was brought to the hospital where x-rays showed a right greater troch fx. It was felt that this may not be the extent of his injuries and an MRI was ordered. That showed extension through the trochanter and orthopedic surgery was consulted. He c/o localized pain to the area of the fracture as well as referred pain down the thigh.  Past Medical History:  Diagnosis Date  . ADENOCARCINOMA, PROSTATE 06/01/2008  . CORONARY ARTERY DISEASE 06/13/2007  . CVA 06/01/2008   x 2  . DIABETES MELLITUS, TYPE I, WITH OPHTHALMIC COMPLICATIONS 1/85/6314  . 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 HYPERKALEMIA 06/01/2008   due to DM neparopathy  . HYPERLIPIDEMIA 06/13/2007  . HYPERTENSION 06/13/2007  . NEOPLASM, MALIGNANT, SKIN, FACE 06/01/2008  . Other chronic nonalcoholic liver disease 9/70/2637  . RENAL DISEASE 06/01/2008    Past Surgical History:  Procedure Laterality Date  . Carotid Duplex  09/12/2004  . CATARACT EXTRACTION    . CHOLECYSTECTOMY    . ESOPHAGOGASTRODUODENOSCOPY  06/28/1991  . HERNIA REPAIR     right Inguinal hernia  . Rest/Stress Cardiolite  01/22/2001    Family History  Problem Relation Age of Onset  . Vision loss Son     Social History:  reports that he quit smoking about 39 years ago. He has never used smokeless tobacco. He reports that he does not drink alcohol or use drugs.  Allergies: No Known Allergies  Medications: I have reviewed the patient's current medications.  Results for orders placed or performed during the hospital encounter of 07/17/18 (from the past 48 hour(s))  Urinalysis, Routine w reflex microscopic     Status: Abnormal   Collection Time: 07/17/18  2:32 AM  Result Value Ref Range   Color, Urine YELLOW YELLOW   APPearance CLEAR CLEAR   Specific Gravity, Urine 1.012  1.005 - 1.030   pH 5.0 5.0 - 8.0   Glucose, UA 50 (A) NEGATIVE mg/dL   Hgb urine dipstick SMALL (A) NEGATIVE   Bilirubin Urine NEGATIVE NEGATIVE   Ketones, ur NEGATIVE NEGATIVE mg/dL   Protein, ur >=300 (A) NEGATIVE mg/dL   Nitrite NEGATIVE NEGATIVE   Leukocytes, UA NEGATIVE NEGATIVE   RBC / HPF 0-5 0 - 5 RBC/hpf   WBC, UA 0-5 0 - 5 WBC/hpf   Bacteria, UA NONE SEEN NONE SEEN   Mucus PRESENT    Hyaline Casts, UA PRESENT     Comment: Performed at [facility], [address], [place], [place] [address]  Basic metabolic panel     Status: Abnormal   Collection Time: 07/17/18  3:18 AM  Result Value Ref Range   Sodium 141 135 - 145 mmol/L   Potassium 4.9 3.5 - 5.1 mmol/L   Chloride 114 (H) 98 - 111 mmol/L   CO2 18 (L) 22 - 32 mmol/L   Glucose, Bld 126 (H) 70 - 99 mg/dL   BUN 39 (H) 8 - 23 mg/dL   Creatinine, Ser 2.18 (H) 0.61 - 1.24 mg/dL   Calcium 8.5 (L) 8.9 - 10.3 mg/dL   GFR calc non Af Amer 25 (L) >60 mL/min   GFR calc Af Amer 29 (L) >60 mL/min    Comment: (NOTE) The eGFR has been calculated using the CKD EPI equation. This calculation  has not been validated in all clinical situations. eGFR's persistently <60 mL/min signify possible Chronic Kidney Disease.    Anion gap 9 5 - 15    Comment: Performed at [facility] [address], [place], [place] [address]  CBC WITH DIFFERENTIAL     Status: Abnormal   Collection Time: 07/17/18  3:18 AM  Result Value Ref Range   WBC 7.5 4.0 - 10.5 K/uL   RBC 2.70 (L) 4.22 - 5.81 MIL/uL   Hemoglobin 8.4 (L) 13.0 - 17.0 g/dL   HCT 27.7 (L) 39.0 - 52.0 %   MCV 102.6 (H) 78.0 - 100.0 fL   MCH 31.1 26.0 - 34.0 pg   MCHC 30.3 30.0 - 36.0 g/dL   RDW 13.5 11.5 - 15.5 %   Platelets  150 - 400 K/uL    PLATELET CLUMPS NOTED ON SMEAR, COUNT APPEARS DECREASED   Neutrophils Relative % 77 %   Neutro Abs 5.8 1.7 - 7.7 K/uL   Lymphocytes Relative 15 %   Lymphs Abs 1.1 0.7 - 4.0 K/uL   Monocytes Relative 6 %   Monocytes Absolute 0.4  0.1 - 1.0 K/uL   Eosinophils Relative 2 %   Eosinophils Absolute 0.1 0.0 - 0.7 K/uL   Basophils Relative 0 %   Basophils Absolute 0.0 0.0 - 0.1 K/uL   Immature Granulocytes 0 %   Abs Immature Granulocytes 0.0 0.0 - 0.1 K/uL    Comment: Performed at [facility], [address] [place], [place] [address]  Protime-INR     Status: None   Collection Time: 07/17/18  3:18 AM  Result Value Ref Range   Prothrombin Time 14.4 11.4 - 15.2 seconds   INR 1.13     Comment: Performed at [facility] [address] [place], [place] [address]  Type and screen Mabel     Status: None   Collection Time: 07/17/18  3:20 AM  Result Value Ref Range   ABO/RH(D) A POS    Antibody Screen NEG    Sample Expiration      07/20/2018 Performed at [facility], [place] [address] [place], [place] [address]   ABO/Rh     Status: None (Preliminary result)   Collection Time: 07/17/18  3:20 AM  Result Value Ref Range   ABO/RH(D)      A POS Performed at [facility] [address] [place], [place] [address]   Glucose, capillary     Status: Abnormal   Collection Time: 07/17/18  8:15 AM  Result Value Ref Range   Glucose-Capillary 114 (H) 70 - 99 mg/dL    Dg Chest 1 View  Result Date: 07/17/2018 CLINICAL DATA:  84 year old male with fall. EXAM: CHEST  1 VIEW COMPARISON:  Chest radiograph dated 02/27/2018 and CT dated 02/27/2018 FINDINGS: There is patchy area of airspace opacity in the right lower lung field and infrahilar region. An area of opacity is also noted in the left lower lobe/retrocardiac area. Although these may the infectious etiology, persistent and interval development of left-sided pleural effusion since the prior radiograph is concerning for underlying neoplasm involving the left lower lobe. Further evaluation with chest CT with IV contrast is recommended. There is a small left pleural effusion. No pneumothorax. There is mild cardiomegaly. Atherosclerotic  calcification of the aortic arch. No acute osseous pathology. IMPRESSION: Persistent airspace densities primarily involving the left lower lobe as well as in the right lower lung field  with interval development or increase in the size of the left-sided pleural effusion. Findings may be infectious in etiology but concerning for a neoplastic process in the left lower lobe. Further evaluation with CT with IV contrast is recommended. Electronically Signed   By: Anner Crete M.D.   On: 07/17/2018 03:33   Dg Lumbar Spine Complete  Result Date: 07/17/2018 CLINICAL DATA:  84 y/o  M; fall with right hip pain. EXAM: LUMBAR SPINE - COMPLETE 4+ VIEW COMPARISON:  01/12/2018 CT abdomen and pelvis FINDINGS: Five lumbar type non-rib-bearing vertebral bodies. Mild reverse S curvature of lumbar spine. Normal lumbar lordosis without listhesis. Stable L2 moderate compression deformity. No new fracture identified. Calcific aortic atherosclerosis. IMPRESSION: Stable chronic moderate L2 compression deformity. No acute fracture identified. Aortic atherosclerosis. Electronically Signed   By: Kristine Garbe M.D.   On: 07/17/2018 03:33   Ct Head Wo Contrast  Result Date: 07/17/2018 CLINICAL DATA:  84 y/o  M; vertical, episodic, peripheral. EXAM: CT HEAD WITHOUT CONTRAST TECHNIQUE: Contiguous axial images were obtained from the base of the skull through the vertex without intravenous contrast. COMPARISON:  02/27/2018 CT head FINDINGS: Brain: No evidence of acute infarction, hemorrhage, hydrocephalus, extra-axial collection or mass lesion/mass effect. Stable small chronic infarcts within the bilateral cerebellar hemispheres. Stable chronic microvascular ischemic changes and volume loss of the brain Vascular: Calcific atherosclerosis of the vertebral arteries and carotid siphons. No hyperdense vessel identified. Skull: Normal. Negative for fracture or focal lesion. Sinuses/Orbits: Mild ethmoid and maxillary sinus mucosal  thickening. Normal aeration of mastoid air cells. Bilateral intra-ocular lens replacement. Other: None. IMPRESSION: 1. No acute intracranial abnormality identified. 2. Stable chronic microvascular ischemic changes and volume loss of the brain. Stable small chronic infarcts in the bilateral cerebellar hemispheres. Electronically Signed   By: Kristine Garbe M.D.   On: 07/17/2018 03:37   Mr Hip Right Wo Contrast  Result Date: 07/17/2018 CLINICAL DATA:  Hip fracture EXAM: MR OF THE RIGHT HIP WITHOUT CONTRAST TECHNIQUE: Multiplanar, multisequence MR imaging was performed. No intravenous contrast was administered. COMPARISON:  07/17/2018 radiograph FINDINGS: Bones: Right hip intertrochanteric fracture with intersecting fracture extending transversely through the greater trochanter. The intertrochanteric portion is relatively nondisplaced. Thin band of accentuated T2 and low T1 signal in the vicinity of the anterior acetabular wall/lateral base of the superior pubic ramus as shown on images 2-5 of series 17, mild stress fracture is a distinct possibility but this is not felt to represent a completed fracture. No concomitant fracture of the inferior pubic ramus is identified. Presacral edema noted. The sacrum is not fully included on any of the sequences, and it is difficult to exclude a small transverse fracture at the level of the sacrococcygeal junction. Articular cartilage and labrum Articular cartilage:  No focal chondral defect. Labrum: Small anterior superior labral tear on images 18-19 of series 15. This is more prominent than I would expect for the normal recess. I also suspect a contralateral (left-sided) labral tear with paralabral cyst. Joint or bursal effusion Joint effusion:  Small right and trace left joint effusions. Bursae: Notable fluid in the right trochanteric bursa adjacent to the greater trochanteric fracture. There is also mild contralateral (left-sided) trochanteric bursitis. Muscles  and tendons Muscles and tendons: Notable edema within along the gluteus medius, gluteus maximus, quadratus femoris, obturator externus, right hip adductor musculature, distal iliopsoas, and proximal vastus lateralis muscles. There is some lesser edema in the proximal semitendinosus muscles bilaterally and in the right gluteus maximus. Note is made of hip  adductor muscular edema on the contralateral, left side. There is also a small amount of edema tracking below the right iliacus muscle. Other findings Miscellaneous: There is potentially some low T2 signal in the left prostate peripheral zone on image 15/13. Sigmoid colon diverticulosis. Indirect right inguinal hernia contains loops of bowel extending down into the scrotum. Separate small bilateral scrotal hydroceles. IMPRESSION: 1. Acute intertrochanteric fracture of the right hip, with intersecting transverse greater trochanteric fracture. 2. Potential mild stress fracture/stress injury in the anterior wall of the right acetabulum. 3. Questionable transverse fracture of the sacrum near the sacrococcygeal junction. There is some presacral edema in this vicinity and questionable osseous edema. Today's exam was not optimized to assess the sacrum and the sacrum is only seen in a very disadvantageous manner which reduces positive predictive value. 4. Abnormal edema in multiple muscle groups in the vicinity of the right hip as described above. Considerable right trochanteric bursitis some of which is likely posttraumatic. Small right and trace left hip joint effusions. 5. Bilateral acetabular labral tears. 6. Left-sided (contralateral) trochanteric bursitis. 7. Low T2 signal in the left peripheral zone of the prostate gland noted. This could be postinflammatory but can also be from prostate cancer. Correlate with PSA level. 8. Indirect right inguinal hernia contains loops of bowel extending down into the scrotum. Separate small bilateral scrotal hydroceles.  Electronically Signed   By: Van Clines M.D.   On: 07/17/2018 08:18   Dg Hip Unilat With Pelvis 2-3 Views Right  Result Date: 07/17/2018 CLINICAL DATA:  84 y/o  M; fall with right hip pain. EXAM: DG HIP (WITH OR WITHOUT PELVIS) 2-3V RIGHT COMPARISON:  None. FINDINGS: Lucency traversing the greater trochanter likely representing a mildly displaced fracture. No additional proximal femur fracture identified. No appreciable pelvic fracture or pelvic diastasis. Vascular calcifications noted. IMPRESSION: Mildly displaced acute fracture of the right femur greater trochanter. No additional fracture identified. Consider further evaluation with right hip CT. Electronically Signed   By: Kristine Garbe M.D.   On: 07/17/2018 03:31    Review of Systems  Constitutional: Negative for weight loss.  HENT: Negative for ear discharge, ear pain, hearing loss and tinnitus.   Eyes: Negative for blurred vision, double vision, photophobia and pain.  Respiratory: Negative for cough, sputum production and shortness of breath.   Cardiovascular: Negative for chest pain.  Gastrointestinal: Negative for abdominal pain, nausea and vomiting.  Genitourinary: Negative for dysuria, flank pain, frequency and urgency.  Musculoskeletal: Positive for back pain and joint pain (Right hip). Negative for falls, myalgias and neck pain.  Neurological: Negative for dizziness, tingling, sensory change, focal weakness, loss of consciousness and headaches.  Endo/Heme/Allergies: Does not bruise/bleed easily.  Psychiatric/Behavioral: Negative for depression, memory loss and substance abuse. The patient is not nervous/anxious.    Blood pressure (!) 174/77, pulse 82, temperature 97.9 F (36.6 C), temperature source Oral, resp. rate (!) 21, height 6' (1.829 m), weight 80.7 kg, SpO2 95 %. Physical Exam  Constitutional: He appears well-developed and well-nourished. No distress.  HENT:  Head: Normocephalic and atraumatic.  Eyes:  Conjunctivae are normal. Right eye exhibits no discharge. Left eye exhibits no discharge. No scleral icterus.  Neck: Normal range of motion.  Cardiovascular: Normal rate and regular rhythm.  Respiratory: Effort normal. No respiratory distress.  Musculoskeletal:  RLE No traumatic wounds, ecchymosis, or rash  Severe TTP hip/thigh  No knee or ankle effusion  Knee stable to varus/ valgus and anterior/posterior stress  Sens DPN, SPN, TN intact  Motor EHL, ext, flex, evers 5/5  DP 1+, PT 1+, No significant edema  Neurological: He is alert.  Skin: Skin is warm and dry. He is not diaphoretic.  Psychiatric: He has a normal mood and affect. His behavior is normal.    Assessment/Plan: Right intertroch hip fx -- Plan IMN today with Dr. Erlinda Hong. NPO until then. Multiple medical problems including CAD, hx/o CVA, DM, HTN, CKD, anemia, thrombocytopenia, and dementia -- May need some blood in advance of surgery    Lisette Abu, PA-C Orthopedic Surgery [phone] 07/17/2018, 9:26 AM

## 2018-07-17 NOTE — Transfer of Care (Signed)
Immediate Anesthesia Transfer of Care Note  Patient: [name]  Procedure(s) Performed: INTRAMEDULLARY (IM) NAIL INTERTROCHANTRIC (N/A )  Patient Location: PACU  Anesthesia Type:General  Level of Consciousness: awake, alert  and patient cooperative  Airway & Oxygen Therapy: Patient Spontanous Breathing  Post-op Assessment: Report given to RN and Post -op Vital signs reviewed and stable  Post vital signs: Reviewed and stable  Last Vitals:  Vitals Value Taken Time  BP 144/54 07/17/2018  1:01 PM  Temp 36.3 C 07/17/2018  1:01 PM  Pulse 67 07/17/2018  1:02 PM  Resp 12 07/17/2018  1:02 PM  SpO2 92 % 07/17/2018  1:02 PM  Vitals shown include unvalidated device data.  Last Pain:  Vitals:   07/17/18 0831  TempSrc:   PainSc: 7       Patients Stated Pain Goal: 6 (25/74/93 5521)  Complications: No apparent anesthesia complications

## 2018-07-17 NOTE — Anesthesia Procedure Notes (Signed)
Procedure Name: Intubation Date/Time: 07/17/2018 11:50 AM Performed by: Verdie Drown, CRNA Pre-anesthesia Checklist: Patient identified, Emergency Drugs available, Suction available and Patient being monitored Patient Re-evaluated:Patient Re-evaluated prior to induction Oxygen Delivery Method: Circle System Utilized Preoxygenation: Pre-oxygenation with 100% oxygen Induction Type: IV induction Ventilation: Mask ventilation without difficulty Laryngoscope Size: Mac and 4 Grade View: Grade I Tube type: Oral Tube size: 7.5 mm Number of attempts: 1 Airway Equipment and Method: Stylet Placement Confirmation: ETT inserted through vocal cords under direct vision,  positive ETCO2 and breath sounds checked- equal and bilateral Secured at: 23 cm Tube secured with: Tape Dental Injury: Teeth and Oropharynx as per pre-operative assessment

## 2018-07-18 ENCOUNTER — Inpatient Hospital Stay (HOSPITAL_COMMUNITY): Payer: Medicare Other

## 2018-07-18 LAB — GLUCOSE, CAPILLARY
GLUCOSE-CAPILLARY: 136 mg/dL — AB (ref 70–99)
GLUCOSE-CAPILLARY: 137 mg/dL — AB (ref 70–99)
GLUCOSE-CAPILLARY: 143 mg/dL — AB (ref 70–99)
GLUCOSE-CAPILLARY: 146 mg/dL — AB (ref 70–99)
GLUCOSE-CAPILLARY: 169 mg/dL — AB (ref 70–99)
GLUCOSE-CAPILLARY: 200 mg/dL — AB (ref 70–99)
Glucose-Capillary: 141 mg/dL — ABNORMAL HIGH (ref 70–99)
Glucose-Capillary: 148 mg/dL — ABNORMAL HIGH (ref 70–99)
Glucose-Capillary: 168 mg/dL — ABNORMAL HIGH (ref 70–99)

## 2018-07-18 LAB — BASIC METABOLIC PANEL
ANION GAP: 3 — AB (ref 5–15)
ANION GAP: 8 (ref 5–15)
Anion gap: 7 (ref 5–15)
Anion gap: 9 (ref 5–15)
Anion gap: 8 (ref 5–15)
BUN: 41 mg/dL — ABNORMAL HIGH (ref 8–23)
BUN: 44 mg/dL — AB (ref 8–23)
BUN: 46 mg/dL — AB (ref 8–23)
BUN: 52 mg/dL — ABNORMAL HIGH (ref 8–23)
BUN: 55 mg/dL — ABNORMAL HIGH (ref 8–23)
CALCIUM: 8.1 mg/dL — AB (ref 8.9–10.3)
CALCIUM: 8.1 mg/dL — AB (ref 8.9–10.3)
CALCIUM: 7.8 mg/dL — AB (ref 8.9–10.3)
CHLORIDE: 115 mmol/L — AB (ref 98–111)
CO2: 20 mmol/L — AB (ref 22–32)
CO2: 17 mmol/L — AB (ref 22–32)
CO2: 17 mmol/L — ABNORMAL LOW (ref 22–32)
CO2: 18 mmol/L — AB (ref 22–32)
CO2: 20 mmol/L — ABNORMAL LOW (ref 22–32)
CREATININE: 2.35 mg/dL — AB (ref 0.61–1.24)
CREATININE: 2.5 mg/dL — AB (ref 0.61–1.24)
CREATININE: 2.57 mg/dL — AB (ref 0.61–1.24)
Calcium: 8.1 mg/dL — ABNORMAL LOW (ref 8.9–10.3)
Calcium: 8.1 mg/dL — ABNORMAL LOW (ref 8.9–10.3)
Chloride: 117 mmol/L — ABNORMAL HIGH (ref 98–111)
Chloride: 113 mmol/L — ABNORMAL HIGH (ref 98–111)
Chloride: 111 mmol/L (ref 98–111)
Chloride: 110 mmol/L (ref 98–111)
Creatinine, Ser: 2.18 mg/dL — ABNORMAL HIGH (ref 0.61–1.24)
Creatinine, Ser: 2.49 mg/dL — ABNORMAL HIGH (ref 0.61–1.24)
GFR calc Af Amer: 26 mL/min — ABNORMAL LOW (ref >60)
GFR calc Af Amer: 24 mL/min — ABNORMAL LOW (ref >60)
GFR calc Af Amer: 24 mL/min — ABNORMAL LOW (ref >60)
GFR calc Af Amer: 23 mL/min — ABNORMAL LOW (ref >60)
GFR calc non Af Amer: 25 mL/min — ABNORMAL LOW (ref >60)
GFR calc non Af Amer: 22 mL/min — ABNORMAL LOW (ref >60)
GFR, EST AFRICAN AMERICAN: 29 mL/min — AB (ref >60)
GFR, EST NON AFRICAN AMERICAN: 21 mL/min — AB (ref >60)
GFR, EST NON AFRICAN AMERICAN: 21 mL/min — AB (ref >60)
GFR, EST NON AFRICAN AMERICAN: 20 mL/min — AB (ref >60)
GLUCOSE: 212 mg/dL — AB (ref 70–99)
GLUCOSE: 184 mg/dL — AB (ref 70–99)
GLUCOSE: 163 mg/dL — AB (ref 70–99)
Glucose, Bld: 152 mg/dL — ABNORMAL HIGH (ref 70–99)
Glucose, Bld: 175 mg/dL — ABNORMAL HIGH (ref 70–99)
POTASSIUM: 5.3 mmol/L — AB (ref 3.5–5.1)
Potassium: 6.1 mmol/L — ABNORMAL HIGH (ref 3.5–5.1)
Potassium: 6 mmol/L — ABNORMAL HIGH (ref 3.5–5.1)
Potassium: 5.6 mmol/L — ABNORMAL HIGH (ref 3.5–5.1)
Potassium: 5.3 mmol/L — ABNORMAL HIGH (ref 3.5–5.1)
SODIUM: 139 mmol/L (ref 135–145)
SODIUM: 138 mmol/L (ref 135–145)
Sodium: 140 mmol/L (ref 135–145)
Sodium: 139 mmol/L (ref 135–145)
Sodium: 137 mmol/L (ref 135–145)

## 2018-07-18 LAB — PREPARE RBC (CROSSMATCH)

## 2018-07-18 LAB — PROCALCITONIN: PROCALCITONIN: 0.28 ng/mL

## 2018-07-18 LAB — LACTIC ACID, PLASMA
LACTIC ACID, VENOUS: 1.2 mmol/L (ref 0.5–1.9)
Lactic Acid, Venous: 1.5 mmol/L (ref 0.5–1.9)

## 2018-07-18 LAB — CBC
HEMATOCRIT: 23.6 % — AB (ref 39.0–52.0)
Hemoglobin: 7.4 g/dL — ABNORMAL LOW (ref 13.0–17.0)
MCH: 31.2 pg (ref 26.0–34.0)
MCHC: 31.4 g/dL (ref 30.0–36.0)
MCV: 99.6 fL (ref 78.0–100.0)
Platelets: 73 10*3/uL — ABNORMAL LOW (ref 150–400)
RBC: 2.37 MIL/uL — ABNORMAL LOW (ref 4.22–5.81)
RDW: 13.5 % (ref 11.5–15.5)
WBC: 7.9 10*3/uL (ref 4.0–10.5)

## 2018-07-18 LAB — PROTIME-INR
INR: 1.2
Prothrombin Time: 15.1 seconds (ref 11.4–15.2)

## 2018-07-18 LAB — MRSA PCR SCREENING: MRSA BY PCR: NEGATIVE

## 2018-07-18 LAB — APTT: aPTT: 33 seconds (ref 24–36)

## 2018-07-18 MED ORDER — AZITHROMYCIN 500 MG PO TABS
500.0000 mg | ORAL_TABLET | Freq: Every day | ORAL | Status: DC
  Administered 2018-07-18: 500 mg via ORAL
  Filled 2018-07-18: qty 1

## 2018-07-18 MED ORDER — FUROSEMIDE 10 MG/ML IJ SOLN
80.0000 mg | Freq: Three times a day (TID) | INTRAMUSCULAR | Status: DC
  Administered 2018-07-18 – 2018-07-19 (×2): 80 mg via INTRAVENOUS
  Filled 2018-07-18 (×2): qty 8

## 2018-07-18 MED ORDER — SODIUM CHLORIDE 0.9 % IV SOLN
2.0000 g | Freq: Once | INTRAVENOUS | Status: AC
  Administered 2018-07-18: 2 g via INTRAVENOUS
  Filled 2018-07-18: qty 2

## 2018-07-18 MED ORDER — SODIUM POLYSTYRENE SULFONATE 15 GM/60ML PO SUSP
30.0000 g | Freq: Once | ORAL | Status: AC
  Administered 2018-07-18: 30 g via ORAL
  Filled 2018-07-18: qty 120

## 2018-07-18 MED ORDER — FUROSEMIDE 10 MG/ML IJ SOLN
20.0000 mg | Freq: Once | INTRAMUSCULAR | Status: AC
  Administered 2018-07-18: 20 mg via INTRAVENOUS
  Filled 2018-07-18: qty 2

## 2018-07-18 MED ORDER — SODIUM CHLORIDE 0.9 % IV SOLN
1.0000 g | Freq: Once | INTRAVENOUS | Status: AC
  Administered 2018-07-18: 1 g via INTRAVENOUS
  Filled 2018-07-18: qty 10

## 2018-07-18 MED ORDER — SODIUM BICARBONATE 650 MG PO TABS: 1300.0000 mg | ORAL_TABLET | Freq: Two times a day (BID) | ORAL | Status: DC

## 2018-07-18 MED ORDER — STERILE WATER FOR INJECTION IV SOLN
INTRAVENOUS | Status: DC
  Administered 2018-07-18: 11:00:00 via INTRAVENOUS
  Filled 2018-07-18: qty 850

## 2018-07-18 MED ORDER — FUROSEMIDE 10 MG/ML IJ SOLN: 80.0000 mg | Freq: Two times a day (BID) | INTRAMUSCULAR | Status: DC

## 2018-07-18 MED ORDER — DEXTROSE 50 % IV SOLN
12.5000 g | Freq: Once | INTRAVENOUS | Status: AC
  Administered 2018-07-18: 12.5 g via INTRAVENOUS
  Filled 2018-07-18: qty 50

## 2018-07-18 MED ORDER — SODIUM CHLORIDE 0.9 % IV SOLN
1.0000 g | INTRAVENOUS | Status: DC
  Administered 2018-07-19 – 2018-07-20 (×2): 1 g via INTRAVENOUS
  Filled 2018-07-18 (×4): qty 1

## 2018-07-18 MED ORDER — SODIUM CHLORIDE 0.9% IV SOLUTION: Freq: Once | INTRAVENOUS | Status: DC

## 2018-07-18 MED ORDER — FUROSEMIDE 10 MG/ML IJ SOLN
40.0000 mg | Freq: Once | INTRAMUSCULAR | Status: AC
  Administered 2018-07-18: 40 mg via INTRAVENOUS
  Filled 2018-07-18: qty 4

## 2018-07-18 MED ORDER — INSULIN ASPART 100 UNIT/ML IV SOLN
10.0000 [IU] | Freq: Once | INTRAVENOUS | Status: AC
  Administered 2018-07-18: 10 [IU] via INTRAVENOUS
  Filled 2018-07-18: qty 0.1

## 2018-07-18 MED ORDER — VANCOMYCIN HCL IN DEXTROSE 1-5 GM/200ML-% IV SOLN: 1000.0000 mg | INTRAVENOUS | Status: DC

## 2018-07-18 MED ORDER — SODIUM CHLORIDE 0.9 % IV SOLN: 250.0000 mL | INTRAVENOUS | Status: DC | PRN

## 2018-07-18 MED ORDER — VANCOMYCIN HCL IN DEXTROSE 1-5 GM/200ML-% IV SOLN
1000.0000 mg | Freq: Once | INTRAVENOUS | Status: AC
  Administered 2018-07-18: 1000 mg via INTRAVENOUS
  Filled 2018-07-18: qty 200

## 2018-07-18 MED ORDER — SODIUM POLYSTYRENE SULFONATE PO POWD
15.0000 g | Freq: Once | ORAL | Status: AC
  Administered 2018-07-18: 15 g via ORAL
  Filled 2018-07-18: qty 15

## 2018-07-18 NOTE — H&P (Signed)
PULMONARY / CRITICAL CARE MEDICINE   Name: Chad Mcmillan MRN: [id number] DOB: 1924-07-23    ADMISSION DATE:  07/17/2018 CONSULTATION DATE:  07/18/18  REFERRING MD:  Niel Hummer  CHIEF COMPLAINT:  dyspnea  HISTORY OF PRESENT ILLNESS:   84 year old male history of coronary artery disease, cerebrovascular, diabetes mellitus, CKD stage IV, dementia who presented after a fall at home and was found to have a right hip fracture.  He was taken to the operating room on 8/30 where he underwent intramedullary implant for treatment of intertrochanteric fracture.  There were no documented complications.  He was also noted to have a right lower lobe infiltrate on his chest x-ray and was complaining of cough, there was concern for possible bronchogenic carcinoma due to a very small left-sided pleural effusion and he was started on ceftriaxone and azithromycin for community for pneumonia coverage with plans to follow-up imaging at a later date.   On postop day 1 he was noted to be hyperkalemic for which he was given kayexylate, insulin/dextrose, and bolused IVF. He was on 2 L nasal cannula and became progressively more hypoxemic, initially switched to 15L [place] then to BPAP.  Of note, he was transfused 1U PRBC starting at 14:40 for presumed postop anemia.  When I saw him he stated that he feels slightly short of breath that has been approved slightly since transitioning to BiPAP.   PAST MEDICAL HISTORY :  He  has a past medical history of ADENOCARCINOMA, PROSTATE (06/01/2008), CORONARY ARTERY DISEASE (06/13/2007), CVA (06/01/2008), DIABETES MELLITUS, TYPE I, WITH OPHTHALMIC COMPLICATIONS (12/12/5807), DM retinopathy ([place]), HYPERKALEMIA (06/01/2008), HYPERLIPIDEMIA (06/13/2007), HYPERTENSION (06/13/2007), NEOPLASM, MALIGNANT, SKIN, FACE (06/01/2008), Other chronic nonalcoholic liver disease (9/83/3825), and RENAL DISEASE (06/01/2008).  PAST SURGICAL HISTORY: He  has a past surgical history that includes Cataract  extraction; Cholecystectomy; Hernia repair; Esophagogastroduodenoscopy (06/28/1991); Rest/Stress Cardiolite (01/22/2001); Carotid Duplex (09/12/2004); and Femur IM nail (Right, 07/17/2018).  No Known Allergies  No current facility-administered medications on file prior to encounter.    Current Outpatient Medications on File Prior to Encounter  Medication Sig  . amLODipine (NORVASC) 5 MG tablet Take 1 tablet (5 mg total) by mouth daily.  Marland Kitchen aspirin EC 81 MG tablet Take 1 tablet (81 mg total) by mouth daily.  Marland Kitchen atorvastatin (LIPITOR) 40 MG tablet Take 2 tablets (80 mg total) by mouth daily at 6 PM.  . carvedilol (COREG) 6.25 MG tablet Take 1 tablet (6.25 mg total) by mouth 2 (two) times daily with a meal.  . donepezil (ARICEPT) 5 MG tablet Take 1 tablet (5 mg total) by mouth at bedtime.  Marland Kitchen lisinopril (PRINIVIL,ZESTRIL) 5 MG tablet Take 1 tablet (5 mg total) by mouth daily.  Marland Kitchen LORazepam (ATIVAN) 0.5 MG tablet Take 1 tablet (0.5 mg total) by mouth daily as needed for anxiety.  . meclizine (ANTIVERT) 12.5 MG tablet TAKE 1 TABLET BY MOUTH THREE TIMES DAILY AS NEEDED FOR DIZZINESS (Patient taking differently: Take 12.5 mg by mouth 3 (three) times daily as needed for dizziness. )  . nitroGLYCERIN (NITROSTAT) 0.4 MG SL tablet Place 1 tablet (0.4 mg total) under the tongue every 5 (five) minutes as needed. For chest pain  . repaglinide (PRANDIN) 0.5 MG tablet Take 1 tablet (0.5 mg total) by mouth 3 (three) times daily before meals.  . vitamin B-12 1000 MCG tablet Take 1 tablet (1,000 mcg total) by mouth daily.    FAMILY HISTORY:  His He indicated that his mother is deceased. He indicated that his father is  deceased. He indicated that two of his four sisters are alive. He indicated that all of his five brothers are deceased. He indicated that the status of his son is unknown.   SOCIAL HISTORY: He  reports that he quit smoking about 39 years ago. He has never used smokeless tobacco. He reports that he  does not drink alcohol or use drugs.  REVIEW OF SYSTEMS:   Full Review of Systems negative except otherwise specified as above   VITAL SIGNS: BP (!) 168/62   Pulse 95   Temp 98 F (36.7 C) (Axillary)   Resp (!) 26   Ht 6' (1.829 m)   Wt 80.7 kg   SpO2 94%   BMI 24.14 kg/m   HEMODYNAMICS:    VENTILATOR SETTINGS:    INTAKE / OUTPUT: I/O last 3 completed shifts: In: 1681.3 [P.O.:240; I.V.:1391.3; Other:50] Out: 25 [Urine:1200; Blood:30]  PHYSICAL EXAMINATION: Physical Exam  Constitutional: He is oriented to person, place, and time.  In moderate respiratory distress, non-toxic appearing, sitting upright in bed  HENT:  Head: Normocephalic and atraumatic.  Eyes: EOM are normal. Right eye exhibits no discharge. Left eye exhibits no discharge. No scleral icterus.  Neck: No JVD present.  Cardiovascular: Normal rate.  No murmur heard. Pulmonary/Chest:  Moderate respiratory distress with mild accessory muscle use and intercostal retractions. On BPAP. Speaking through mask in full sentences  Abdominal: Soft. He exhibits no distension. There is no tenderness.  Musculoskeletal: He exhibits edema (severe bilateral LE). He exhibits no deformity.  RLE tender over hip  Neurological: He is alert and oriented to person, place, and time. No cranial nerve deficit.  Skin: Skin is warm and dry.    LABS:  BMET Recent Labs  Lab 07/18/18 0427 07/18/18 0830 07/18/18 1228  NA 140 139 139  K 6.1* 6.0* 5.6*  CL 117* 115* 113*  CO2 20* 17* 17*  BUN 41* 44* 46*  CREATININE 2.18* 2.35* 2.50*  GLUCOSE 152* 212* 184*    Electrolytes Recent Labs  Lab 07/18/18 0427 07/18/18 0830 07/18/18 1228  CALCIUM 8.1* 8.1* 8.1*    CBC Recent Labs  Lab 07/17/18 0318 07/17/18 0812 07/18/18 0427  WBC 7.5  --  7.9  HGB 8.4*  --  7.4*  HCT 27.7*  --  23.6*  PLT PLATELET CLUMPS NOTED ON SMEAR, COUNT APPEARS DECREASED 74* 73*    Coag's Recent Labs  Lab 07/17/18 0318  INR 1.13     Sepsis Markers No results for input(s): LATICACIDVEN, PROCALCITON, O2SATVEN in the last 168 hours.  ABG No results for input(s): PHART, PCO2ART, PO2ART in the last 168 hours.  Liver Enzymes No results for input(s): AST, ALT, ALKPHOS, BILITOT, ALBUMIN in the last 168 hours.  Cardiac Enzymes No results for input(s): TROPONINI, PROBNP in the last 168 hours.  Glucose Recent Labs  Lab 07/18/18 0454 07/18/18 0719 07/18/18 0946 07/18/18 1119 07/18/18 1227 07/18/18 1659  GLUCAP 137* 136* 200* 146* 169* 168*    Imaging No results found.   STUDIES:  CXR 8/30 RLL infiltrate, small left effusion CXR 8/31: increasing RLL infiltrate, new LLL infiltrate. Increasing size of right effusion MRI R femur 8/30:1. Acute intertrochanteric fracture of the right hip, with intersecting transverse greater trochanteric fracture.  CULTURES: 8/31 BC: pending Urine strep negative Legionella pending Sputum culture: awaiting to be collected  ANTIBIOTICS: Anti-infectives (From admission, onward)   Start     Dose/Rate Route Frequency Ordered Stop   07/20/18 1800  vancomycin (VANCOCIN) IVPB 1000 mg/200 mL  premix     1,000 mg 200 mL/hr over 60 Minutes Intravenous Every 48 hours 07/18/18 1748     07/19/18 1800  ceFEPIme (MAXIPIME) 1 g in sodium chloride 0.9 % 100 mL IVPB     1 g 200 mL/hr over 30 Minutes Intravenous Every 24 hours 07/18/18 1748     07/18/18 1745  vancomycin (VANCOCIN) IVPB 1000 mg/200 mL premix     1,000 mg 200 mL/hr over 60 Minutes Intravenous  Once 07/18/18 1740     07/18/18 1745  ceFEPIme (MAXIPIME) 2 g in sodium chloride 0.9 % 100 mL IVPB     2 g 200 mL/hr over 30 Minutes Intravenous  Once 07/18/18 1740     07/18/18 1000  azithromycin (ZITHROMAX) tablet 500 mg  Status:  Discontinued     500 mg Oral Daily 07/18/18 0948 07/18/18 1740   07/17/18 1445  ceFAZolin (ANCEF) IVPB 2g/100 mL premix  Status:  Discontinued     2 g 200 mL/hr over 30 Minutes Intravenous Every 6 hours  07/17/18 1432 07/17/18 1435   07/17/18 1100  ceFAZolin (ANCEF) IVPB 2g/100 mL premix     2 g 200 mL/hr over 30 Minutes Intravenous On call to O.R. 07/17/18 1056 07/17/18 1151   07/17/18 1058  ceFAZolin (ANCEF) 2-4 GM/100ML-% IVPB    Note to Pharmacy:  Lisette Grinder   : cabinet override      07/17/18 1058 07/17/18 1151   07/17/18 0700  cefTRIAXone (ROCEPHIN) 1 g in sodium chloride 0.9 % 100 mL IVPB  Status:  Discontinued     1 g 200 mL/hr over 30 Minutes Intravenous Daily 07/17/18 0519 07/18/18 1740   07/17/18 0700  azithromycin (ZITHROMAX) 500 mg in sodium chloride 0.9 % 250 mL IVPB  Status:  Discontinued     500 mg 250 mL/hr over 60 Minutes Intravenous Daily 07/17/18 0519 07/18/18 0948       SIGNIFICANT EVENTS: 8/31 consulted for hypoxemic respiratory failure  LINES/TUBES: none  DISCUSSION: none  ASSESSMENT / PLAN: 84 year old male history of coronary artery disease, cerebrovascular accident, diabetes mellitus, CKD stage IV, dementia who presented after a fall at home and was found to have a right hip fracture. Postoperatively he was noted to develop worsening hypoxemic respiratory failure, worsening renal failure with hyperkalemia.  Transferred to MICU for monitoring of respiratory status.  PULMONARY A: Acute hypoxemic respiratory failure - possibly due to worsening pneumonia as evidenced by repeat CXR 8/31 however also received 1 U PRBC shortly before raising possibility of TRALI or TACO. Also possibly contributing is worsening renal failure Small bilateral pleural effusions: possibly related to hypervolemia, will need repeat imaging after resolution of acute illness  P:   - transfer to MICU for closer monitoring of respiratory status. Will trial high flow nasal cannula (optiflow)  - aggressive diuresis   CARDIOVASCULAR A:  Hemodynamically stable No significant evidence of HF on TTE 02/2018 P:  - lasix 80mg  given, redose based on response Continue with coreg, Norvasc.   [place] hydralazine.   RENAL A:   Worsening acute on CKD Hyperkalemia - presumed due to worsening AKI Non-anion gap metabolic acidosis:   P:   - possibly cardiorenal.  monitor renal indices closely as well as potassium with diuresis - hold off on further bicarb, consult renal if AKI not improving with diuresis  GASTROINTESTINAL A:   No active issues   HEMATOLOGIC A:   anemia  P:  - monitor response to 1U PRBC given 8/31, no evidence of active  bleeding  INFECTIOUS A:   HAP  P:   - antibiotics broadened 8/31 as above. Follow up on sputum/blood cultures, urine antigen  ENDOCRINE A:   diabetes mellitus    P:   Sliding scale insulin  NEUROLOGIC A:   History of CVA  P:   - continue aspirin   FAMILY  - Updates: none  - Inter-disciplinary family meet or Palliative Care meeting due by:  day 7   Mali Almadelia Looman, MD [facility] [place] Pager: [phone]  07/18/2018, 5:50 PM

## 2018-07-18 NOTE — Progress Notes (Addendum)
PROGRESS NOTE    Chad TREINEN  Mcmillan:[id number] DOB: 1924/07/03 DOA: 07/17/2018 PCP: [name], MD    Brief Narrative by Dr Myna Hidalgo: Chad Mcmillan is a 84 y.o. male with medical history significant for coronary artery disease, history of CVA, type 2 diabetes mellitus, hypertension, chronic kidney disease stage IV, chronic anemia, thrombocytopenia, and dementia, now presenting to the emergency department with severe right hip pain after fall at home.  The patient reports that he has been experiencing mild dyspnea and occasional cough, but no chest pain, fevers, or chills.  He was walking with his cane last night when he tripped and fell onto his right side, denies hitting his head or losing consciousness.  He was experiencing pain in the low back, and particularly in the right hip.  He was unable to get up due to the severe pain in his right hip.  His son, with whom he lives, called EMS and the patient was treated with fentanyl prior to arrival in the ED.  ED Course: Upon arrival to the ED, patient is found to be afebrile, saturating well on room air, and with blood pressure 180/80.  EKG features a sinus rhythm with first-degree AV nodal block and LVH with repolarization abnormality.  Radiographs of the right hip are concerning for mildly displaced acute fracture of the right femoral greater trochanter.  There is no acute fracture on lumbar spine radiographs.  Chest x-ray is notable for persistent airspace densities primarily involving the left lower lobe where there is interval increase in a pleural effusion suggestive of infection or neoplasia.  Noncontrast head CT is negative for acute intracranial abnormality.  Chemistry panel is notable for a creatinine of 2.18, similar to priors.  CBC features a microcytic anemia with hemoglobin of 8.4, down from the 9-11 range several months ago.  Platelets are clumped on the CBC.  Type and screen was performed, fentanyl was administered, and orthopedic surgery was  consulted by the ED physician with recommendation to admit to medicine, keep the patient n.p.o., and MRI the right hip.''  Assessment & Plan:   Principal Problem:   Displaced fracture of greater trochanter of right femur, initial encounter for closed fracture [facility]) Active Problems:   Essential hypertension   History of stroke   DM (diabetes mellitus) ([place])   Anemia   CKD (chronic kidney disease), stage IV ([place])   Dementia   Closed displaced fracture of greater trochanter of right femur ([place])   History of thrombocytopenia   Pleural effusion, left   Abnormal finding on chest xray   CAP (community acquired pneumonia)  Addendum 4;40 Pm 1-Acute hypoxic Respiratory Failure; Became more hypoxic , tachypnea.  Place on 1 5 L oxygen--- now BIPAP.  IV lasix 80 mg given.  Hold on Blood transfusion.  Stat Chest x ray.  CCM consulted.   Acute fracture of right greater trochanter;  Mechanical fall at home.  X ray; suggest mildly displaced fracture of right greater trochanter  Underwent to surgery 8-30. Intramedullary implant.  PT per ortho.    Hyperkalemia; in setting of renal failure;  Calcium gluconate given. EKG obtain.  Insulin, D 50.  Kayexalate times 2.  Bicarb gtt for few hours.  Repeat B-met tonight.   Anemia, thrombocytopenia;  Hb ranges 9--11.  Has history of thrombocytopenia.  Continue with B 12 supplementation.  History of thrombocytopenia. But 3 month ago platelet were normal.  Hb lower at 7. Will transfuse one unit PRBC> lasix for transfusion.   Left  pleural effusion ? PNA;  - CXR with airspace opacities bilaterally and left-sided pleural effusion  -antibiotics trial.  -needs follow up chest x ray to document resolution.  Continue with antibiotics.   Acute on CKD stage  IV;  Cr baseline 1.9--2.0 Worsening cr.  Avoid hypotension.  Will give fluids for 10 hours.  Strict I and o.    DM type II;  Hold prandin.  SSI.   HTN;  Continue with coreg, Norvasc.    [place] hydralazine.   History of CVA;  Continue with lipitor. Aspirin on hold for sx.   Dementia; continue with Aricept.   Bradycardia;  Had an episode f bradycardia while getting up with PT.  Monitor on telemetry.  Correct hyperkalemia.  Holder parameter for carvedilol.    left Upper extremity deformity; chronic but he has been complaining of shoulder pain. Will get x ray.      DVT prophylaxis: SCD Code Status: full code.  Family Communication: Son updated over phone.  Disposition Plan:  He will need rehab.   Consultants:   Ortho    Procedures:   Sx, hip repair    Antimicrobials:   Prophylaxis.    Subjective: He is alert. Denies chest pain or dyspnea. Not feeling well.  Report pain hip//   Objective: Vitals:   07/17/18 1617 07/17/18 2004 07/17/18 2350 07/18/18 0458  BP: (!) 159/60 (!) 153/72 (!) 146/73 (!) 160/66  Pulse: (!) 57 (!) 57 66 66  Resp:      Temp:   97.7 F (36.5 C) 97.7 F (36.5 C)  TempSrc:   Oral Axillary  SpO2: 100% 92% 93% 97%  Weight:      Height:        Intake/Output Summary (Last 24 hours) at 07/18/2018 0907 Last data filed at 07/18/2018 0502 Gross per 24 hour  Intake 1681.25 ml  Output 530 ml  Net 1151.25 ml   Filed Weights   07/17/18 0158  Weight: 80.7 kg    Examination:  General exam: NAD Respiratory system: no crackles, no wheezing.  Cardiovascular system: S 1, S 2 RRR Gastrointestinal system: BS present, soft, nt Central nervous system: Alert, following command.  Extremities: left LE with dressing  Skin: No rashes, lesions or ulcers   Data Reviewed: I have personally reviewed following labs and imaging studies  CBC: Recent Labs  Lab 07/17/18 0318 07/17/18 0812 07/18/18 0427  WBC 7.5  --  7.9  NEUTROABS 5.8  --   --   HGB 8.4*  --  7.4*  HCT 27.7*  --  23.6*  MCV 102.6*  --  99.6  PLT PLATELET CLUMPS NOTED ON SMEAR, COUNT APPEARS DECREASED 74* 73*   Basic Metabolic Panel: Recent Labs  Lab  07/17/18 0318 07/18/18 0427  NA 141 140  K 4.9 6.1*  CL 114* 117*  CO2 18* 20*  GLUCOSE 126* 152*  BUN 39* 41*  CREATININE 2.18* 2.18*  CALCIUM 8.5* 8.1*   GFR: Estimated Creatinine Clearance: 23.7 mL/min (A) (by C-G formula based on SCr of 2.18 mg/dL (H)). Liver Function Tests: No results for input(s): AST, ALT, ALKPHOS, BILITOT, PROT, ALBUMIN in the last 168 hours. No results for input(s): LIPASE, AMYLASE in the last 168 hours. No results for input(s): AMMONIA in the last 168 hours. Coagulation Profile: Recent Labs  Lab 07/17/18 0318  INR 1.13   Cardiac Enzymes: No results for input(s): CKTOTAL, CKMB, CKMBINDEX, TROPONINI in the last 168 hours. BNP (last 3 results) No results for input(s): PROBNP  in the last 8760 hours. HbA1C: No results for input(s): HGBA1C in the last 72 hours. CBG: Recent Labs  Lab 07/17/18 1613 07/17/18 2005 07/17/18 2354 07/18/18 0454 07/18/18 0719  GLUCAP 147* 151* 143* 137* 136*   Lipid Profile: No results for input(s): CHOL, HDL, LDLCALC, TRIG, CHOLHDL, LDLDIRECT in the last 72 hours. Thyroid Function Tests: No results for input(s): TSH, T4TOTAL, FREET4, T3FREE, THYROIDAB in the last 72 hours. Anemia Panel: No results for input(s): VITAMINB12, FOLATE, FERRITIN, TIBC, IRON, RETICCTPCT in the last 72 hours. Sepsis Labs: No results for input(s): PROCALCITON, LATICACIDVEN in the last 168 hours.  Recent Results (from the past 240 hour(s))  MRSA PCR Screening     Status: None   Collection Time: 07/18/18 12:00 AM  Result Value Ref Range Status   MRSA by PCR NEGATIVE NEGATIVE Final    Comment:        The GeneXpert MRSA Assay (FDA approved for NASAL specimens only), is one component of a comprehensive MRSA colonization surveillance program. It is not intended to diagnose MRSA infection nor to guide or monitor treatment for MRSA infections. Performed at [facility], [place] [address] [place], [place] [address]           Radiology Studies: Dg Chest 1 View  Result Date: 07/17/2018 CLINICAL DATA:  84 year old male with fall. EXAM: CHEST  1 VIEW COMPARISON:  Chest radiograph dated 02/27/2018 and CT dated 02/27/2018 FINDINGS: There is patchy area of airspace opacity in the right lower lung field and infrahilar region. An area of opacity is also noted in the left lower lobe/retrocardiac area. Although these may the infectious etiology, persistent and interval development of left-sided pleural effusion since the prior radiograph is concerning for underlying neoplasm involving the left lower lobe. Further evaluation with chest CT with IV contrast is recommended. There is a small left pleural effusion. No pneumothorax. There is mild cardiomegaly. Atherosclerotic calcification of the aortic arch. No acute osseous pathology. IMPRESSION: Persistent airspace densities primarily involving the left lower lobe as well as in the right lower lung field with interval development or increase in the size of the left-sided pleural effusion. Findings may be infectious in etiology but concerning for a neoplastic process in the left lower lobe. Further evaluation with CT with IV contrast is recommended. Electronically Signed   By: Anner Crete M.D.   On: 07/17/2018 03:33   Dg Lumbar Spine Complete  Result Date: 07/17/2018 CLINICAL DATA:  84 y/o  M; fall with right hip pain. EXAM: LUMBAR SPINE - COMPLETE 4+ VIEW COMPARISON:  01/12/2018 CT abdomen and pelvis FINDINGS: Five lumbar type non-rib-bearing vertebral bodies. Mild reverse S curvature of lumbar spine. Normal lumbar lordosis without listhesis. Stable L2 moderate compression deformity. No new fracture identified. Calcific aortic atherosclerosis. IMPRESSION: Stable chronic moderate L2 compression deformity. No acute fracture identified. Aortic atherosclerosis. Electronically Signed   By: Kristine Garbe M.D.   On: 07/17/2018 03:33   Ct Head Wo Contrast  Result Date:  07/17/2018 CLINICAL DATA:  84 y/o  M; vertical, episodic, peripheral. EXAM: CT HEAD WITHOUT CONTRAST TECHNIQUE: Contiguous axial images were obtained from the base of the skull through the vertex without intravenous contrast. COMPARISON:  02/27/2018 CT head FINDINGS: Brain: No evidence of acute infarction, hemorrhage, hydrocephalus, extra-axial collection or mass lesion/mass effect. Stable small chronic infarcts within the bilateral cerebellar hemispheres. Stable chronic microvascular ischemic changes and volume loss of the brain Vascular: Calcific atherosclerosis of the vertebral arteries and carotid siphons. No hyperdense vessel  identified. Skull: Normal. Negative for fracture or focal lesion. Sinuses/Orbits: Mild ethmoid and maxillary sinus mucosal thickening. Normal aeration of mastoid air cells. Bilateral intra-ocular lens replacement. Other: None. IMPRESSION: 1. No acute intracranial abnormality identified. 2. Stable chronic microvascular ischemic changes and volume loss of the brain. Stable small chronic infarcts in the bilateral cerebellar hemispheres. Electronically Signed   By: Kristine Garbe M.D.   On: 07/17/2018 03:37   Mr Hip Right Wo Contrast  Result Date: 07/17/2018 CLINICAL DATA:  Hip fracture EXAM: MR OF THE RIGHT HIP WITHOUT CONTRAST TECHNIQUE: Multiplanar, multisequence MR imaging was performed. No intravenous contrast was administered. COMPARISON:  07/17/2018 radiograph FINDINGS: Bones: Right hip intertrochanteric fracture with intersecting fracture extending transversely through the greater trochanter. The intertrochanteric portion is relatively nondisplaced. Thin band of accentuated T2 and low T1 signal in the vicinity of the anterior acetabular wall/lateral base of the superior pubic ramus as shown on images 2-5 of series 17, mild stress fracture is a distinct possibility but this is not felt to represent a completed fracture. No concomitant fracture of the inferior pubic ramus  is identified. Presacral edema noted. The sacrum is not fully included on any of the sequences, and it is difficult to exclude a small transverse fracture at the level of the sacrococcygeal junction. Articular cartilage and labrum Articular cartilage:  No focal chondral defect. Labrum: Small anterior superior labral tear on images 18-19 of series 15. This is more prominent than I would expect for the normal recess. I also suspect a contralateral (left-sided) labral tear with paralabral cyst. Joint or bursal effusion Joint effusion:  Small right and trace left joint effusions. Bursae: Notable fluid in the right trochanteric bursa adjacent to the greater trochanteric fracture. There is also mild contralateral (left-sided) trochanteric bursitis. Muscles and tendons Muscles and tendons: Notable edema within along the gluteus medius, gluteus maximus, quadratus femoris, obturator externus, right hip adductor musculature, distal iliopsoas, and proximal vastus lateralis muscles. There is some lesser edema in the proximal semitendinosus muscles bilaterally and in the right gluteus maximus. Note is made of hip adductor muscular edema on the contralateral, left side. There is also a small amount of edema tracking below the right iliacus muscle. Other findings Miscellaneous: There is potentially some low T2 signal in the left prostate peripheral zone on image 15/13. Sigmoid colon diverticulosis. Indirect right inguinal hernia contains loops of bowel extending down into the scrotum. Separate small bilateral scrotal hydroceles. IMPRESSION: 1. Acute intertrochanteric fracture of the right hip, with intersecting transverse greater trochanteric fracture. 2. Potential mild stress fracture/stress injury in the anterior wall of the right acetabulum. 3. Questionable transverse fracture of the sacrum near the sacrococcygeal junction. There is some presacral edema in this vicinity and questionable osseous edema. Today's exam was not  optimized to assess the sacrum and the sacrum is only seen in a very disadvantageous manner which reduces positive predictive value. 4. Abnormal edema in multiple muscle groups in the vicinity of the right hip as described above. Considerable right trochanteric bursitis some of which is likely posttraumatic. Small right and trace left hip joint effusions. 5. Bilateral acetabular labral tears. 6. Left-sided (contralateral) trochanteric bursitis. 7. Low T2 signal in the left peripheral zone of the prostate gland noted. This could be postinflammatory but can also be from prostate cancer. Correlate with PSA level. 8. Indirect right inguinal hernia contains loops of bowel extending down into the scrotum. Separate small bilateral scrotal hydroceles. Electronically Signed   By: Cindra Eves.D.  On: 07/17/2018 08:18   Dg C-arm 1-60 Min  Result Date: 07/17/2018 CLINICAL DATA:  IM nail fixation of right femur EXAM: DG C-ARM 61-120 MIN; RIGHT FEMUR 2 VIEWS COMPARISON:  MRI 07/17/2018 FINDINGS: 74 seconds of fluoroscopic time was utilized. AP and frog-leg lateral views of the hip and distal femur were acquired demonstrating presence of an intramedullary nail extending to the distal metadiaphysis of the right femur. No complicating features. Fine bony detail is limited by the C-arm fluoroscopic technique. IMPRESSION: 74 seconds of fluoroscopic time utilized for placement of intramedullary nail within the right femur. No immediate intraoperative complicating features are noted. Electronically Signed   By: Ashley Royalty M.D.   On: 07/17/2018 14:08   Dg Hip Unilat With Pelvis 2-3 Views Right  Result Date: 07/17/2018 CLINICAL DATA:  84 y/o  M; fall with right hip pain. EXAM: DG HIP (WITH OR WITHOUT PELVIS) 2-3V RIGHT COMPARISON:  None. FINDINGS: Lucency traversing the greater trochanter likely representing a mildly displaced fracture. No additional proximal femur fracture identified. No appreciable pelvic fracture or  pelvic diastasis. Vascular calcifications noted. IMPRESSION: Mildly displaced acute fracture of the right femur greater trochanter. No additional fracture identified. Consider further evaluation with right hip CT. Electronically Signed   By: Kristine Garbe M.D.   On: 07/17/2018 03:31   Dg Femur, Min 2 Views Right  Result Date: 07/17/2018 CLINICAL DATA:  IM nail fixation of right femur EXAM: DG C-ARM 61-120 MIN; RIGHT FEMUR 2 VIEWS COMPARISON:  MRI 07/17/2018 FINDINGS: 74 seconds of fluoroscopic time was utilized. AP and frog-leg lateral views of the hip and distal femur were acquired demonstrating presence of an intramedullary nail extending to the distal metadiaphysis of the right femur. No complicating features. Fine bony detail is limited by the C-arm fluoroscopic technique. IMPRESSION: 74 seconds of fluoroscopic time utilized for placement of intramedullary nail within the right femur. No immediate intraoperative complicating features are noted. Electronically Signed   By: Ashley Royalty M.D.   On: 07/17/2018 14:08        Scheduled Meds: . sodium chloride   Intravenous Once  . acetaminophen  500 mg Oral Q6H  . amLODipine  5 mg Oral Daily  . atorvastatin  80 mg Oral q1800  . carvedilol  6.25 mg Oral BID WC  . docusate sodium  100 mg Oral BID  . donepezil  5 mg Oral QHS  . enoxaparin (LOVENOX) injection  30 mg Subcutaneous Q24H  . furosemide  20 mg Intravenous Once  . insulin aspart  0-9 Units Subcutaneous Q4H  . sodium polystyrene  30 g Oral Once  . cyanocobalamin  1,000 mcg Oral Daily   Continuous Infusions: . sodium chloride    . azithromycin    . calcium gluconate    . cefTRIAXone (ROCEPHIN)  IV    . methocarbamol (ROBAXIN) IV       LOS: 1 day    Time spent: 35 minutes.     Elmarie Shiley, MD [facility] Pager [phone]  If 7PM-7AM, please contact night-coverage [email or web address] Password [email or web address] 07/18/2018, 9:07 AM

## 2018-07-18 NOTE — Progress Notes (Signed)
Pharmacy Antibiotic Note  Chad Mcmillan is a 84 y.o. male admitted on 07/17/2018 with sepsis.  Pharmacy has been consulted for Cefepime / Vancomycin  dosing.  Plan: Vancomycin 1 gram iv Q 48 hours Cefepime 1 grams iv Q 24 hours Follow up progress, Scr, cultures  Height: 6' (182.9 cm) Weight: 178 lb (80.7 kg) IBW/kg (Calculated) : 77.6  Temp (24hrs), Avg:97.8 F (36.6 C), Min:97.6 F (36.4 C), Max:98 F (36.7 C)  Recent Labs  Lab 07/17/18 0318 07/18/18 0427 07/18/18 0830 07/18/18 1228  WBC 7.5 7.9  --   --   CREATININE 2.18* 2.18* 2.35* 2.50*    Estimated Creatinine Clearance: 20.7 mL/min (A) (by C-G formula based on SCr of 2.5 mg/dL (H)).    No Known Allergies   Thank you  Chad Mcmillan, PharmD [phone] Chad Mcmillan 07/18/2018 5:45 PM

## 2018-07-18 NOTE — Evaluation (Signed)
Physical Therapy Evaluation Patient Details Name: Chad Mcmillan MRN: [id number] DOB: [birth year] Today's Date: 07/18/2018   History of Present Illness  84 y.o.-year-old male who sustained a right hip fracture. S/p IM nail on 8/30 of RLE. PMH including coronary artery disease, history of CVA, type 2 diabetes mellitus, hypertension, chronic kidney disease stage IV, chronic anemia, thrombocytopenia, and dementia.  Clinical Impression  Prior to admission, patient uses cane or walker for mobility and has history of frequent falling. On PT evaluation, pt requiring two person minimal assistance for transfers to standing using walker. Unable to progress to stand pivot or ambulation this session due to pt HR decrease to 35; returned to sitting edge of bed and HR returned to 90s-114. Pt presents as high fall risk based on deficits and history of falling. Highly recommending SNF at discharge to maximize functional independence. Pending patient progress, may benefit from Home First program to transition to home environment.Will follow acutely to progress mobility.    Follow Up Recommendations SNF;Supervision/Assistance - 24 hour    Equipment Recommendations  Other (comment)(defer to next venue)    Recommendations for Other Services       Precautions / Restrictions Precautions Precautions: Fall Restrictions Weight Bearing Restrictions: Yes RLE Weight Bearing: Weight bearing as tolerated      Mobility  Bed Mobility Overal bed mobility: Needs Assistance Bed Mobility: Supine to Sit;Sit to Supine     Supine to sit: Min assist Sit to supine: Mod assist;+2 for physical assistance   General bed mobility comments: Min A to facilitate RLE towards EOB. VCs for use of bedrails and sequencing. Mod A +2 for reutrning to supine with assistance to elevate BLEs and then transition hips with bed pad  Transfers Overall transfer level: Needs assistance Equipment used: Rolling walker (2 wheeled) Transfers: Sit  to/from Stand Sit to Stand: Min assist;+2 safety/equipment;From elevated surface         General transfer comment: Min A for power up into standing. +2 for safety and cues for hand placement and sequencing. Pt with fear of falling and required increased encouragement. Unable to take side steps toward [facility].  Ambulation/Gait                Stairs            Wheelchair Mobility    Modified Rankin (Stroke Patients Only)       Balance Overall balance assessment: Needs assistance Sitting-balance support: No upper extremity supported;Feet supported Sitting balance-Leahy Scale: Fair     Standing balance support: Bilateral upper extremity supported;During functional activity Standing balance-Leahy Scale: Poor Standing balance comment: Reliant on UE support                             Pertinent Vitals/Pain Pain Assessment: Faces Faces Pain Scale: Hurts even more Pain Location: RLE Pain Descriptors / Indicators: Constant;Discomfort;Grimacing Pain Intervention(s): Monitored during session;Limited activity within patient's tolerance    Home Living Family/patient expects to be discharged to:: Skilled nursing facility Living Arrangements: Spouse/significant other             Home Equipment: Walker - 4 wheels;Cane - single point;Toilet riser Additional Comments: Pt reports he is the primary caregiver for his wife (who has dementia) and has help from his two sons.     Prior Function Level of Independence: Independent with assistive device(s)         Comments: Pt performing functional mobility with cane or RW  Hand Dominance   Dominant Hand: Right    Extremity/Trunk Assessment   Upper Extremity Assessment Upper Extremity Assessment: Generalized weakness    Lower Extremity Assessment Lower Extremity Assessment: RLE deficits/detail RLE Deficits / Details: s/p IM placement at RLE after mechanic fall. Edema noted RLE Coordination: decreased  gross motor    Cervical / Trunk Assessment Cervical / Trunk Assessment: Kyphotic  Communication   Communication: HOH  Cognition Arousal/Alertness: Awake/alert Behavior During Therapy: WFL for tasks assessed/performed Overall Cognitive Status: History of cognitive impairments - at baseline Area of Impairment: Problem solving;Following commands;Awareness                       Following Commands: Follows one step commands with increased time   Awareness: Emergent Problem Solving: Slow processing;Requires verbal cues General Comments: Baseline dementia. Pt with decreased processing and requires increased time and cues      General Comments General comments (skin integrity, edema, etc.): SpO2 85-98 on RA. Dropped to 80s and placed on 2L O2. HR dropped to 35 bpm during standing. Sat back down and pt HR elevating to 114 and then staying above 80s. Pt reported feeling "swimmy headed" in standing; BP 163/72 sitting EOB.    Exercises     Assessment/Plan    PT Assessment Patient needs continued PT services  PT Problem List Decreased strength;Decreased range of motion;Decreased activity tolerance;Decreased balance;Decreased mobility;Decreased coordination;Decreased cognition;Decreased safety awareness;Pain       PT Treatment Interventions DME instruction;Gait training;Functional mobility training;Therapeutic activities;Therapeutic exercise;Balance training;Patient/family education    PT Goals (Current goals can be found in the Care Plan section)  Acute Rehab PT Goals Patient Stated Goal: Return to home with Wife PT Goal Formulation: With patient/family Time For Goal Achievement: 08/01/18 Potential to Achieve Goals: Fair    Frequency Min 3X/week   Barriers to discharge Decreased caregiver support      Co-evaluation PT/OT/SLP Co-Evaluation/Treatment: Yes Reason for Co-Treatment: Necessary to address cognition/behavior during functional activity;For patient/therapist  safety;To address functional/ADL transfers PT goals addressed during session: Mobility/safety with mobility         AM-PAC PT "6 Clicks" Daily Activity  Outcome Measure Difficulty turning over in bed (including adjusting bedclothes, sheets and blankets)?: A Little Difficulty moving from lying on back to sitting on the side of the bed? : Unable Difficulty sitting down on and standing up from a chair with arms (e.g., wheelchair, bedside commode, etc,.)?: Unable Help needed moving to and from a bed to chair (including a wheelchair)?: A Little Help needed walking in hospital room?: A Little Help needed climbing 3-5 steps with a railing? : A Lot 6 Click Score: 13    End of Session Equipment Utilized During Treatment: Gait belt Activity Tolerance: Treatment limited secondary to medical complications (Comment) Patient left: in bed;with call bell/phone within reach;with nursing/sitter in room Nurse Communication: Mobility status PT Visit Diagnosis: Unsteadiness on feet (R26.81);Other abnormalities of gait and mobility (R26.89);History of falling (Z91.81);Difficulty in walking, not elsewhere classified (R26.2);Pain Pain - Right/Left: Right Pain - part of body: Leg    Time: 8416-6063 PT Time Calculation (min) (ACUTE ONLY): 26 min   Charges:   PT Evaluation $PT Eval Moderate Complexity: 1 Mod          Ellamae Sia, PT, DPT Acute Rehabilitation Services  Pager: [phone] 07/18/2018, 4:02 PM

## 2018-07-18 NOTE — Progress Notes (Signed)
Patient was able to sign his own blood consent form, but I did call his son Louie Casa to inform him that we would be giving the patient a unit of blood today.

## 2018-07-18 NOTE — Progress Notes (Signed)
Went into patient's room and saw that his oxygen sats were about 87% on 2L. Stated he was having a harder time breathing than normal. Titrated oxygen on nasal cannula without relief, then put on non-rebreather without relief. Respiratory therapy and MD notified. Patient put on Bipap with sats around 97%. 2 doses IV lasix given. Family notified by MD. Received order for patient to transfer to ICU.

## 2018-07-18 NOTE — Plan of Care (Signed)
  Problem: Education: Goal: Knowledge of General Education information will improve Description: Including pain rating scale, medication(s)/side effects and non-pharmacologic comfort measures Outcome: Progressing   Problem: Activity: Goal: Risk for activity intolerance will decrease Outcome: Progressing   Problem: Nutrition: Goal: Adequate nutrition will be maintained Outcome: Progressing   Problem: Coping: Goal: Level of anxiety will decrease Outcome: Progressing   Problem: Pain Managment: Goal: General experience of comfort will improve Outcome: Progressing   Problem: Skin Integrity: Goal: Risk for impaired skin integrity will decrease Outcome: Progressing   

## 2018-07-18 NOTE — Evaluation (Signed)
Occupational Therapy Evaluation Patient Details Name: Chad Mcmillan MRN: [id number] DOB: 1924-04-06 Today's Date: 07/18/2018    History of Present Illness 84 y.o.-year-old male who sustained a right hip fracture. S/p IM nail on 8/30 of RLE. PMH including coronary artery disease, history of CVA, type 2 diabetes mellitus, hypertension, chronic kidney disease stage IV, chronic anemia, thrombocytopenia, and dementia.   Clinical Impression   PTA, pt was living with his wife whom he reports has dementia and performed ADLs and used cane/RW for functional mobility. Pt also reporting he sons live nearby. Pt currently requiring Mod A for LB ADLs, Min A-Mod A +2 for bed mobility, and Min A +2 for functional transfers with RW. Pt with increased fear of falling impacting his functional performance. During activity, pt HR decreasing to 35; sitting down and HR returning to 90s-114. Pt reporting dizziness with activity. Pt would benefit from further acute OT to facilitate safe dc. Recommend dc to SNF for further OT to optimize safety, independence with ADLs, and return to PLOF. Pending pt progress, family support, and activity tolerance, pt may benefit from Home First program to address ADLs, fall prevention, and cognition in home environment.      Follow Up Recommendations  SNF;Supervision/Assistance - 24 hour(Pending progress, possible good candidate for Home First)    Equipment Recommendations  Other (comment)(Defer to next venue)    Recommendations for Other Services PT consult     Precautions / Restrictions Precautions Precautions: Fall Restrictions Weight Bearing Restrictions: Yes RLE Weight Bearing: Weight bearing as tolerated      Mobility Bed Mobility Overal bed mobility: Needs Assistance Bed Mobility: Supine to Sit;Sit to Supine     Supine to sit: Min assist Sit to supine: Mod assist;+2 for physical assistance   General bed mobility comments: Min A to facilitate RLE towards EOB. VCs  for use of bedrails and sequencing. Mod A +2 for reutrning to supine with assistance to elevate BLEs and then transition hips with bed pad  Transfers Overall transfer level: Needs assistance Equipment used: Rolling walker (2 wheeled) Transfers: Sit to/from Stand Sit to Stand: Min assist;+2 safety/equipment;From elevated surface         General transfer comment: Min A for power up into standing. +2 for safety and cues for hand placement and sequencing. Pt with fear of falling and required increased encouragement.    Balance Overall balance assessment: Needs assistance Sitting-balance support: No upper extremity supported;Feet supported Sitting balance-Leahy Scale: Fair     Standing balance support: Bilateral upper extremity supported;During functional activity Standing balance-Leahy Scale: Poor Standing balance comment: Reliant on UE support                           ADL either performed or assessed with clinical judgement   ADL Overall ADL's : Needs assistance/impaired Eating/Feeding: Set up;Supervision/ safety;Sitting   Grooming: Set up;Supervision/safety;Bed level;Sitting   Upper Body Bathing: Minimal assistance;Sitting   Lower Body Bathing: Minimal assistance;Sit to/from stand   Upper Body Dressing : Minimal assistance;Sitting   Lower Body Dressing: Minimal assistance;Sit to/from stand               Functional mobility during ADLs: Minimal assistance;+2 for physical assistance;Rolling walker(Lateral side steps) General ADL Comments: Pt limited by pain, decreased strength, and fear of falling.      Vision         Perception     Praxis      Pertinent Vitals/Pain Pain Assessment:  Faces Faces Pain Scale: Hurts even more Pain Location: RLE Pain Descriptors / Indicators: Constant;Discomfort;Grimacing Pain Intervention(s): Monitored during session;Limited activity within patient's tolerance;Repositioned     Hand Dominance Right    Extremity/Trunk Assessment Upper Extremity Assessment Upper Extremity Assessment: Generalized weakness   Lower Extremity Assessment Lower Extremity Assessment: RLE deficits/detail RLE Deficits / Details: s/p IM placement at RLE after mechanic fall. Edema noted RLE Coordination: decreased gross motor   Cervical / Trunk Assessment Cervical / Trunk Assessment: Kyphotic   Communication Communication Communication: HOH   Cognition Arousal/Alertness: Awake/alert Behavior During Therapy: WFL for tasks assessed/performed Overall Cognitive Status: History of cognitive impairments - at baseline Area of Impairment: Problem solving;Following commands;Awareness                       Following Commands: Follows one step commands with increased time   Awareness: Emergent Problem Solving: Slow processing;Requires verbal cues General Comments: Baseline dementia. Pt with decreased processing and requires increased time and cues   General Comments  SpO2 85-98. Dropping to 80s and placing on 2L O2. HR dropping to 35 during standing. Sitting back down and pt HR elevating to 114 and staying above 80s. Pt reporting feeling "swimmy headed" in standing; BP 163/72 sitting at EOB    Exercises     Shoulder Instructions      Home Living Family/patient expects to be discharged to:: Skilled nursing facility Living Arrangements: Spouse/significant other                           Home Equipment: Walker - 4 wheels;Cane - single point;Toilet riser   Additional Comments: Pt reports he is the primary caregiver for his wife (who has dementia) and has help from his two sons.       Prior Functioning/Environment Level of Independence: Independent with assistive device(s)        Comments: Pt performing functional mobility with cane or RW        OT Problem List: Decreased strength;Decreased range of motion;Decreased activity tolerance;Impaired balance (sitting and/or  standing);Decreased cognition;Decreased safety awareness;Decreased knowledge of use of DME or AE;Decreased knowledge of precautions;Pain;Increased edema      OT Treatment/Interventions: Self-care/ADL training;Therapeutic exercise;Energy conservation;DME and/or AE instruction;Therapeutic activities;Patient/family education    OT Goals(Current goals can be found in the care plan section) Acute Rehab OT Goals Patient Stated Goal: Return to home with Wife OT Goal Formulation: With patient Time For Goal Achievement: 08/01/18 Potential to Achieve Goals: Good ADL Goals Pt Will Perform Upper Body Dressing: with set-up;with supervision;sitting Pt Will Perform Lower Body Dressing: with set-up;with supervision;sit to/from stand Pt Will Transfer to Toilet: with set-up;with supervision;ambulating;bedside commode Pt Will Perform Toileting - Clothing Manipulation and hygiene: with supervision;with set-up;sit to/from stand Additional ADL Goal #1: Pt will perform bed mobility with supervision in preparation for ADLs  OT Frequency: Min 2X/week   Barriers to D/C:            Co-evaluation              AM-PAC PT "6 Clicks" Daily Activity     Outcome Measure Help from another person eating meals?: None Help from another person taking care of personal grooming?: A Little Help from another person toileting, which includes using toliet, bedpan, or urinal?: A Lot Help from another person bathing (including washing, rinsing, drying)?: A Lot Help from another person to put on and taking off regular upper body clothing?: A Little Help  from another person to put on and taking off regular lower body clothing?: A Lot 6 Click Score: 16   End of Session Equipment Utilized During Treatment: Gait belt;Rolling walker Nurse Communication: Mobility status;Other (comment)(Pt reporting feeling dizzy)  Activity Tolerance: Patient limited by pain;Patient limited by fatigue;Treatment limited secondary to medical  complications (Comment) Patient left: in bed;with call bell/phone within reach;with nursing/sitter in room  OT Visit Diagnosis: Unsteadiness on feet (R26.81);Other abnormalities of gait and mobility (R26.89);Muscle weakness (generalized) (M62.81);Pain;History of falling (Z91.81);Repeated falls (R29.6);Other symptoms and signs involving cognitive function Pain - Right/Left: Right Pain - part of body: Leg                Time: 1962-2297 OT Time Calculation (min): 26 min Charges:  OT General Charges $OT Visit: 1 Visit OT Evaluation $OT Eval Moderate Complexity: [place], OTR/L Acute Rehab Pager: [phone] Office: [place] 07/18/2018, 11:38 AM

## 2018-07-18 NOTE — Progress Notes (Signed)
Report called to 89M charge nurse.

## 2018-07-18 NOTE — Progress Notes (Signed)
     Subjective: 1 Day Post-Op Procedure(s) (LRB): INTRAMEDULLARY (IM) NAIL INTERTROCHANTRIC (N/A) Awake, alert and oriented x 2. BIPAP and HOB elevated. Right hip feels better.  Still significant medical issues, renal and pulmonary.  Anemia chronic decreased one Hgb unit post right hip IM nail for intertrochanteric hip fracture.  Patient reports pain as moderate.    Objective:   VITALS:  Temp:  [97 F (36.1 C)-98 F (36.7 C)] 97 F (36.1 C) (08/31 1815) Pulse Rate:  [57-105] 95 (08/31 1642) Resp:  [18-26] 26 (08/31 1642) BP: (146-168)/(51-73) 162/62 (08/31 1815) SpO2:  [92 %-100 %] 94 % (08/31 1642)  Neurologically intact ABD soft Sensation intact distally Intact pulses distally Incision: dressing C/D/I and no drainage   LABS Recent Labs    07/17/18 0318 07/17/18 0812 07/18/18 0427  HGB 8.4*  --  7.4*  WBC 7.5  --  7.9  PLT PLATELET CLUMPS NOTED ON SMEAR, COUNT APPEARS DECREASED 74* 73*   Recent Labs    07/18/18 0830 07/18/18 1228  NA 139 139  K 6.0* 5.6*  CL 115* 113*  CO2 17* 17*  BUN 44* 46*  CREATININE 2.35* 2.50*  GLUCOSE 212* 184*   Recent Labs    07/17/18 0318  INR 1.13     Assessment/Plan: 1 Day Post-Op Procedure(s) (LRB): INTRAMEDULLARY (IM) NAIL INTERTROCHANTRIC (N/A)   Presently in guarded condition, renal insufficiency, anemia worsened with hip fracture and ORIF but may not tolerate more volume. May eventually need blood transfusion acute onchronic anemia due to blood loss and renal disease. Discharge to SNF when stable, he is presently in  Guarded condition with major renal and pulmonary concerns that limit ability to mobilize.   Basil Dess 07/18/2018, 6:23 PMPatient ID: Chad Mcmillan, male   DOB: 03/22/1924, 84 y.o.   MRN: [id number]

## 2018-07-19 DIAGNOSIS — I5033 Acute on chronic diastolic (congestive) heart failure: Secondary | ICD-10-CM

## 2018-07-19 DIAGNOSIS — Z862 Personal history of diseases of the blood and blood-forming organs and certain disorders involving the immune mechanism: Secondary | ICD-10-CM

## 2018-07-19 DIAGNOSIS — J9601 Acute respiratory failure with hypoxia: Secondary | ICD-10-CM

## 2018-07-19 DIAGNOSIS — N189 Chronic kidney disease, unspecified: Secondary | ICD-10-CM

## 2018-07-19 DIAGNOSIS — Z8673 Personal history of transient ischemic attack (TIA), and cerebral infarction without residual deficits: Secondary | ICD-10-CM

## 2018-07-19 DIAGNOSIS — I10 Essential (primary) hypertension: Secondary | ICD-10-CM

## 2018-07-19 DIAGNOSIS — D638 Anemia in other chronic diseases classified elsewhere: Secondary | ICD-10-CM

## 2018-07-19 DIAGNOSIS — D62 Acute posthemorrhagic anemia: Secondary | ICD-10-CM

## 2018-07-19 DIAGNOSIS — I4891 Unspecified atrial fibrillation: Secondary | ICD-10-CM

## 2018-07-19 DIAGNOSIS — J9691 Respiratory failure, unspecified with hypoxia: Secondary | ICD-10-CM

## 2018-07-19 DIAGNOSIS — N289 Disorder of kidney and ureter, unspecified: Secondary | ICD-10-CM

## 2018-07-19 DIAGNOSIS — S42032A Displaced fracture of lateral end of left clavicle, initial encounter for closed fracture: Secondary | ICD-10-CM | POA: Diagnosis present

## 2018-07-19 DIAGNOSIS — N184 Chronic kidney disease, stage 4 (severe): Secondary | ICD-10-CM

## 2018-07-19 DIAGNOSIS — I25810 Atherosclerosis of coronary artery bypass graft(s) without angina pectoris: Secondary | ICD-10-CM

## 2018-07-19 LAB — BASIC METABOLIC PANEL
ANION GAP: 8 (ref 5–15)
BUN: 55 mg/dL — ABNORMAL HIGH (ref 8–23)
CO2: 20 mmol/L — ABNORMAL LOW (ref 22–32)
Calcium: 8.1 mg/dL — ABNORMAL LOW (ref 8.9–10.3)
Chloride: 113 mmol/L — ABNORMAL HIGH (ref 98–111)
Creatinine, Ser: 2.7 mg/dL — ABNORMAL HIGH (ref 0.61–1.24)
GFR, EST AFRICAN AMERICAN: 22 mL/min — AB (ref >60)
GFR, EST NON AFRICAN AMERICAN: 19 mL/min — AB (ref >60)
GLUCOSE: 150 mg/dL — AB (ref 70–99)
POTASSIUM: 4.6 mmol/L (ref 3.5–5.1)
SODIUM: 141 mmol/L (ref 135–145)

## 2018-07-19 LAB — BPAM RBC
BLOOD PRODUCT EXPIRATION DATE: 201909172359
ISSUE DATE / TIME: 201908311431
Unit Type and Rh: 6200

## 2018-07-19 LAB — TYPE AND SCREEN
ABO/RH(D): A POS
ANTIBODY SCREEN: NEGATIVE
UNIT DIVISION: 0

## 2018-07-19 LAB — GLUCOSE, CAPILLARY
GLUCOSE-CAPILLARY: 153 mg/dL — AB (ref 70–99)
GLUCOSE-CAPILLARY: 191 mg/dL — AB (ref 70–99)
GLUCOSE-CAPILLARY: 196 mg/dL — AB (ref 70–99)
Glucose-Capillary: 140 mg/dL — ABNORMAL HIGH (ref 70–99)

## 2018-07-19 LAB — CBC
HCT: 26.5 % — ABNORMAL LOW (ref 39.0–52.0)
Hemoglobin: 8.7 g/dL — ABNORMAL LOW (ref 13.0–17.0)
MCH: 31.8 pg (ref 26.0–34.0)
MCHC: 32.8 g/dL (ref 30.0–36.0)
MCV: 96.7 fL (ref 78.0–100.0)
PLATELETS: 93 10*3/uL — AB (ref 150–400)
RBC: 2.74 MIL/uL — AB (ref 4.22–5.81)
RDW: 14.1 % (ref 11.5–15.5)
WBC: 12 10*3/uL — AB (ref 4.0–10.5)

## 2018-07-19 MED ORDER — METOPROLOL TARTRATE 5 MG/5ML IV SOLN
5.0000 mg | Freq: Three times a day (TID) | INTRAVENOUS | Status: DC | PRN
  Administered 2018-07-19: 5 mg via INTRAVENOUS
  Filled 2018-07-19: qty 5

## 2018-07-19 MED ORDER — ORAL CARE MOUTH RINSE
15.0000 mL | Freq: Two times a day (BID) | OROMUCOSAL | Status: DC
  Administered 2018-07-20 – 2018-07-23 (×7): 15 mL via OROMUCOSAL

## 2018-07-19 MED ORDER — SODIUM POLYSTYRENE SULFONATE 15 GM/60ML PO SUSP
30.0000 g | Freq: Once | ORAL | Status: AC
  Administered 2018-07-19: 30 g via ORAL
  Filled 2018-07-19: qty 120

## 2018-07-19 MED ORDER — FENTANYL CITRATE (PF) 100 MCG/2ML IJ SOLN
INTRAMUSCULAR | Status: AC
  Filled 2018-07-19: qty 2

## 2018-07-19 MED ORDER — CARVEDILOL 12.5 MG PO TABS
12.5000 mg | ORAL_TABLET | Freq: Two times a day (BID) | ORAL | Status: DC
  Administered 2018-07-19 – 2018-07-22 (×6): 12.5 mg via ORAL
  Filled 2018-07-19 (×6): qty 1

## 2018-07-19 MED ORDER — METOPROLOL TARTRATE 5 MG/5ML IV SOLN
INTRAVENOUS | Status: AC
  Administered 2018-07-19: 5 mg
  Filled 2018-07-19: qty 5

## 2018-07-19 MED ORDER — FUROSEMIDE 10 MG/ML IJ SOLN
80.0000 mg | Freq: Two times a day (BID) | INTRAMUSCULAR | Status: DC
  Administered 2018-07-19 – 2018-07-20 (×2): 80 mg via INTRAVENOUS
  Filled 2018-07-19 (×2): qty 8

## 2018-07-19 MED ORDER — AMLODIPINE BESYLATE 2.5 MG PO TABS: 2.5000 mg | ORAL_TABLET | Freq: Every day | ORAL | Status: DC

## 2018-07-19 MED ORDER — APIXABAN 2.5 MG PO TABS
2.5000 mg | ORAL_TABLET | Freq: Two times a day (BID) | ORAL | Status: DC
  Administered 2018-07-19 – 2018-07-22 (×7): 2.5 mg via ORAL
  Filled 2018-07-19 (×7): qty 1

## 2018-07-19 NOTE — Progress Notes (Signed)
RT note: patient resting comfortably on nasal cannula at this time with no distress noted.  Bipap currently not indicated.  Will continue to monitor and assess for bipap needs.

## 2018-07-19 NOTE — Progress Notes (Signed)
   Brief Update:  Patient doing well this morning off BIPAP. I spoke with Dr. Tyrell Antonio from the hospitalist service who will assume care and transfer to SDU status.   CCM will sign off. Please call with any questions.   Garner Nash, DO [facility] 07/19/2018 8:02 AM  Personal pager: [phone] If unanswered, please page CCM On-call: [phone]

## 2018-07-19 NOTE — Progress Notes (Signed)
[place] Progress Note Patient Name: Chad Mcmillan DOB: 1924/02/23 MRN: [id number]   Date of Service  07/19/2018  HPI/Events of Note  Urinary retention  eICU Interventions  Foley ordered      Intervention Category Minor Interventions: Other:  Sharia Reeve 07/19/2018, 1:19 AM

## 2018-07-19 NOTE — Progress Notes (Addendum)
PROGRESS NOTE    Chad Mcmillan  HYQ:[id number] DOB: 30-Oct-1924 DOA: 07/17/2018 PCP: [name], MD    Brief Narrative by Dr Myna Hidalgo: Chad Mcmillan is a 84 y.o. male with medical history significant for coronary artery disease, history of CVA, type 2 diabetes mellitus, hypertension, chronic kidney disease stage IV, chronic anemia, thrombocytopenia, and dementia, now presenting to the emergency department with severe right hip pain after fall at home.  The patient reports that he has been experiencing mild dyspnea and occasional cough, but no chest pain, fevers, or chills.  He was walking with his cane last night when he tripped and fell onto his right side, denies hitting his head or losing consciousness.  He was experiencing pain in the low back, and particularly in the right hip.  He was unable to get up due to the severe pain in his right hip.  His son, with whom he lives, called EMS and the patient was treated with fentanyl prior to arrival in the ED.  ED Course: Upon arrival to the ED, patient is found to be afebrile, saturating well on room air, and with blood pressure 180/80.  EKG features a sinus rhythm with first-degree AV nodal block and LVH with repolarization abnormality.  Radiographs of the right hip are concerning for mildly displaced acute fracture of the right femoral greater trochanter.  There is no acute fracture on lumbar spine radiographs.  Chest x-ray is notable for persistent airspace densities primarily involving the left lower lobe where there is interval increase in a pleural effusion suggestive of infection or neoplasia.  Noncontrast head CT is negative for acute intracranial abnormality.  Chemistry panel is notable for a creatinine of 2.18, similar to priors.  CBC features a microcytic anemia with hemoglobin of 8.4, down from the 9-11 range several months ago.  Platelets are clumped on the CBC.  Type and screen was performed, fentanyl was administered, and orthopedic surgery was  consulted by the ED physician with recommendation to admit to medicine, keep the patient n.p.o., and MRI the right hip.''  Assessment & Plan:   Principal Problem:   Displaced fracture of greater trochanter of right femur, initial encounter for closed fracture [facility]) Active Problems:   Essential hypertension   History of stroke   DM (diabetes mellitus) ([place])   Anemia   CKD (chronic kidney disease), stage IV ([place])   Dementia   Closed displaced fracture of greater trochanter of right femur ([place])   History of thrombocytopenia   Pleural effusion, left   Abnormal finding on chest xray   CAP (community acquired pneumonia)   Respiratory failure with hypoxia ([place])   1-Acute hypoxic Respiratory Failure; Pulmonary edema vs less likely  PNA Chest x ray with asymmetric edema, Vs PNA>  Became more hypoxic, tachypnea.  Place on 15 L oxygen--- then BIPAP.  Continue with IV lasix 80 mg Q 12 hours.  CCM consulted. Appreciate assistance.  Now off BIPAP. Good urine out put.  Transfer to step down.  Discontinue vancomycin. Continue with cefepime for now.   Acute fracture of right greater trochanter;  Mechanical fall at home.  -X ray; suggest mildly displaced fracture of right greater trochanter  -Underwent to surgery 8-30. Intramedullary implant.  -PT per ortho.   A fib, new onset.  Suspect related to acute illness.  Continue with carvedilol.  IV metoprolol PRN.  Cardiology consulted.   Hyperkalemia; in setting of renal failure;  Calcium gluconate given. Insulin, D 50, Bicarb Kayexalate times 3.  Resolved.   Anemia, thrombocytopenia;  Hb ranges 9--11.  Has history of thrombocytopenia.  Continue with B 12 supplementation.  History of thrombocytopenia. But 3 month ago platelet were normal.  S/P transfusion 8-31 Hb stable.   Left pleural effusion,  PNA;  - CXR with airspace opacities bilaterally and left-sided pleural effusion  -needs follow up chest x ray to document resolution.    -antibiotics change to cefepime and vancomycin.  MRSA PCR negative, will stop vancomycin.   Acute on CKD stage  IV;  Cr baseline 1.9--2.0 Increase cr to. 2.7.  Monitor urine out put.  Monitor on lasix.   DM type II;  Hold prandin.  SSI.   HTN;  Continue with coreg, Norvasc.  [place] hydralazine.   History of CVA;  Continue with lipitor. Aspirin on hold for sx.   Dementia; continue with Aricept.   Bradycardia;  Had an episode of bradycardia while getting up with PT.  Monitor on telemetry.  Correct hyperkalemia.  Holder parameter for carvedilol.   Left Upper extremity deformity; chronic but he has been complaining of shoulder pain.  -X ray; Mildly impacted nondisplaced fracture of the distal aspect of the left clavicle just medial to the acromioclavicular joint. Humerus x ray ; iOld ununited fracture of the mid humeral diaphysis with chronic posttraumatic deformity. Will defer to ortho.   DVT prophylaxis: SCD Code Status: full code.  Family Communication: Son updated over phone.  Disposition Plan:  He will need rehab.   Consultants:   Ortho    Procedures:   Sx, hip repair    Antimicrobials:   Prophylaxis.    Subjective: He is feeling better, breathing better.  Had urine retention , now has foley   Objective: Vitals:   07/19/18 0435 07/19/18 0436 07/19/18 0500 07/19/18 0723  BP:   (!) 177/92 (!) 144/70  Pulse:  87 83 85  Resp:  (!) 29 15 18   Temp:      TempSrc:      SpO2:  97% 98% 99%  Weight: 84.1 kg     Height:        Intake/Output Summary (Last 24 hours) at 07/19/2018 0726 Last data filed at 07/19/2018 0600 Gross per 24 hour  Intake 618.82 ml  Output 2800 ml  Net -2181.18 ml   Filed Weights   07/17/18 0158 07/18/18 2100 07/19/18 0435  Weight: 80.7 kg 84.3 kg 84.1 kg    Examination:  General exam: NAD Respiratory system: Bilateral crackles, less.  Cardiovascular system: S 1, S 2 RRR Gastrointestinal system: BS present, soft, nt Central  nervous system: Alert, follows command.  Extremities: Left LE with dressing.  Skin: No rashes, lesions or ulcers   Data Reviewed: I have personally reviewed following labs and imaging studies  CBC: Recent Labs  Lab 07/17/18 0318 07/17/18 0812 07/18/18 0427 07/19/18 0422  WBC 7.5  --  7.9 12.0*  NEUTROABS 5.8  --   --   --   HGB 8.4*  --  7.4* 8.7*  HCT 27.7*  --  23.6* 26.5*  MCV 102.6*  --  99.6 96.7  PLT PLATELET CLUMPS NOTED ON SMEAR, COUNT APPEARS DECREASED 74* 73* 93*   Basic Metabolic Panel: Recent Labs  Lab 07/18/18 0830 07/18/18 1228 07/18/18 2014 07/18/18 2239 07/19/18 0422  NA 139 139 137 138 141  K 6.0* 5.6* 5.3* 5.3* 4.6  CL 115* 113* 111 110 113*  CO2 17* 17* 18* 20* 20*  GLUCOSE 212* 184* 163* 175* 150*  BUN 44*  46* 52* 55* 55*  CREATININE 2.35* 2.50* 2.49* 2.57* 2.70*  CALCIUM 8.1* 8.1* 8.1* 7.8* 8.1*   GFR: Estimated Creatinine Clearance: 19.2 mL/min (A) (by C-G formula based on SCr of 2.7 mg/dL (H)). Liver Function Tests: No results for input(s): AST, ALT, ALKPHOS, BILITOT, PROT, ALBUMIN in the last 168 hours. No results for input(s): LIPASE, AMYLASE in the last 168 hours. No results for input(s): AMMONIA in the last 168 hours. Coagulation Profile: Recent Labs  Lab 07/17/18 0318 07/18/18 2014  INR 1.13 1.20   Cardiac Enzymes: No results for input(s): CKTOTAL, CKMB, CKMBINDEX, TROPONINI in the last 168 hours. BNP (last 3 results) No results for input(s): PROBNP in the last 8760 hours. HbA1C: No results for input(s): HGBA1C in the last 72 hours. CBG: Recent Labs  Lab 07/18/18 1227 07/18/18 1659 07/18/18 2008 07/18/18 2347 07/19/18 0418  GLUCAP 169* 168* 148* 141* 140*   Lipid Profile: No results for input(s): CHOL, HDL, LDLCALC, TRIG, CHOLHDL, LDLDIRECT in the last 72 hours. Thyroid Function Tests: No results for input(s): TSH, T4TOTAL, FREET4, T3FREE, THYROIDAB in the last 72 hours. Anemia Panel: No results for input(s):  VITAMINB12, FOLATE, FERRITIN, TIBC, IRON, RETICCTPCT in the last 72 hours. Sepsis Labs: Recent Labs  Lab 07/18/18 2014 07/18/18 2239  PROCALCITON 0.28  --   LATICACIDVEN 1.5 1.2    Recent Results (from the past 240 hour(s))  MRSA PCR Screening     Status: None   Collection Time: 07/18/18 12:00 AM  Result Value Ref Range Status   MRSA by PCR NEGATIVE NEGATIVE Final    Comment:        The GeneXpert MRSA Assay (FDA approved for NASAL specimens only), is one component of a comprehensive MRSA colonization surveillance program. It is not intended to diagnose MRSA infection nor to guide or monitor treatment for MRSA infections. Performed at [facility], [place] [address] [place], [place] [address]          Radiology Studies: Dg Chest Port 1 View  Result Date: 07/18/2018 CLINICAL DATA:  Hypoxemia per ordering notes. EXAM: PORTABLE CHEST 1 VIEW COMPARISON:  07/17/2018 FINDINGS: There are perihilar infiltrates bilaterally, confluent at the LEFT lung base. Heart size is UPPER normal. There is atherosclerotic calcification of the thoracic aorta. Small bilateral pleural effusions are present. Patient is kyphotic. IMPRESSION: Bilateral pulmonary infiltrates, most consistent with asymmetric pulmonary edema. Infection could have a similar appearance. Aortic atherosclerosis. (ICD10-I70.0) Electronically Signed   By: Nolon Nations M.D.   On: 07/18/2018 18:27   Dg Shoulder Left  Result Date: 07/18/2018 CLINICAL DATA:  84 year old male with history of recent fall complaining of pain in the left shoulder. EXAM: LEFT SHOULDER - 2+ VIEW COMPARISON:  No priors. FINDINGS: Two views of the left shoulder demonstrate a mildly impacted nondisplaced fracture of the distal aspect of the left clavicle. Visualized portions of the scapula appear intact. Old healed fracture with posttraumatic deformity in the mid humeral diaphysis. IMPRESSION: 1. Mildly impacted nondisplaced fracture of the distal  aspect of the left clavicle just medial to the acromioclavicular joint. Electronically Signed   By: Vinnie Langton M.D.   On: 07/18/2018 18:01   Dg Humerus Left  Result Date: 07/18/2018 CLINICAL DATA:  84 year old male with history of recent falls complaining of pain in the left shoulder. EXAM: LEFT HUMERUS - 2+ VIEW COMPARISON:  Left humerus radiograph 05/24/2012. FINDINGS: Acute impacted nondisplaced fracture of the distal aspect of the left clavicle, incompletely imaged (please see dedicated left shoulder  radiographs for full description). Old ununited fracture of the mid humeral diaphysis with posttraumatic deformity. No acute displaced fracture of the left humerus. IMPRESSION: 1. No acute abnormality of the left humerus. 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 Electronically Signed   By: Vinnie Langton M.D.   On: 07/18/2018 18:03   Dg C-arm 1-60 Min  Result Date: 07/17/2018 CLINICAL DATA:  IM nail fixation of right femur EXAM: DG C-ARM 61-120 MIN; RIGHT FEMUR 2 VIEWS COMPARISON:  MRI 07/17/2018 FINDINGS: 74 seconds of fluoroscopic time was utilized. AP and frog-leg lateral views of the hip and distal femur were acquired demonstrating presence of an intramedullary nail extending to the distal metadiaphysis of the right femur. No complicating features. Fine bony detail is limited by the C-arm fluoroscopic technique. IMPRESSION: 74 seconds of fluoroscopic time utilized for placement of intramedullary nail within the right femur. No immediate intraoperative complicating features are noted. Electronically Signed   By: Ashley Royalty M.D.   On: 07/17/2018 14:08   Dg Femur, Min 2 Views Right  Result Date: 07/17/2018 CLINICAL DATA:  IM nail fixation of right femur EXAM: DG C-ARM 61-120 MIN; RIGHT FEMUR 2 VIEWS COMPARISON:  MRI 07/17/2018 FINDINGS: 74 seconds of fluoroscopic time was utilized. AP and frog-leg lateral views of the hip and distal femur were  acquired demonstrating presence of an intramedullary nail extending to the distal metadiaphysis of the right femur. No complicating features. Fine bony detail is limited by the C-arm fluoroscopic technique. IMPRESSION: 74 seconds of fluoroscopic time utilized for placement of intramedullary nail within the right femur. No immediate intraoperative complicating features are noted. Electronically Signed   By: Ashley Royalty M.D.   On: 07/17/2018 14:08        Scheduled Meds: . sodium chloride   Intravenous Once  . amLODipine  5 mg Oral Daily  . atorvastatin  80 mg Oral q1800  . carvedilol  6.25 mg Oral BID WC  . docusate sodium  100 mg Oral BID  . donepezil  5 mg Oral QHS  . enoxaparin (LOVENOX) injection  30 mg Subcutaneous Q24H  . furosemide  80 mg Intravenous Q8H  . insulin aspart  0-9 Units Subcutaneous Q4H  . cyanocobalamin  1,000 mcg Oral Daily   Continuous Infusions: . sodium chloride    . ceFEPime (MAXIPIME) IV    . methocarbamol (ROBAXIN) IV    . [START ON 07/20/2018] vancomycin       LOS: 2 days    Time spent: 35 minutes.     Elmarie Shiley, MD [facility] Pager [phone]  If 7PM-7AM, please contact night-coverage [email or web address] Password [facility] 07/19/2018, 7:26 AM

## 2018-07-19 NOTE — Progress Notes (Signed)
Pt on no O2 at this time. Pt has bipap in room. Rt will assess for need before bedtime.

## 2018-07-19 NOTE — Consult Note (Signed)
Cardiology Consultation:   Patient ID: Chad Mcmillan; [id number]; 1924-06-16   Admit date: 07/17/2018 Date of Consult: 07/19/2018  Primary Care Provider: [name], MD Primary Cardiologist: Harrington Challenger Primary Electrophysiologist:  n/a   Patient Profile:   Chad Mcmillan is a 84 y.o. male with a hx of CAD s/p CABG who is being seen today for the evaluation of new atrial fibrillation  at the request of Dr. Tyrell Antonio.  History of Present Illness:   Chad Mcmillan is a history of coronary artery disease with remote bypass surgery, diabetes mellitus, chronic kidney disease stage IV complicated by anemia, hypertension, mild thrombocytopenia and early dementia.  He was hospitalized for a fall on August 30 and underwent surgery for a intertrochanteric fracture of the right femur.  Postoperatively, he received a blood transfusion, developed worsening lung infiltrates and worsening right pleural effusion complicated by hypoxemia, improved after treatment with diuretics.  His most recent echocardiogram performed earlier this year shows normal left ventricular systolic function with limited regional wall motion abnormalities.  Baseline creatinine seems to be around 2.0, now increased to 2.7.  Baseline hemoglobin is around 10, decreased to 7.4, now up to 8.7 after his transfusion.  Erythrocyte indices are normocytic/normochromic  On presentation to the hospital his rhythm was normal sinus with first-degree AV block.  Today he went into atrial fibrillation with rapid ventricular response.  Rate control was easily achieved with medications.  His last echo shows mild left atrial dilatation.  There are no serious valvular abnormalities.  He has a reported history of stroke.  His last visit with [name] she expressed concern about his poor balance and unsteady gait.  He has not had any recent problems with angina pectoris.  His breathing seems to be back to normal.  Past Medical History:  Diagnosis Date  .  ADENOCARCINOMA, PROSTATE 06/01/2008  . CORONARY ARTERY DISEASE 06/13/2007  . CVA 06/01/2008   x 2  . DIABETES MELLITUS, TYPE I, WITH OPHTHALMIC COMPLICATIONS 9/56/2130  . 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 HYPERKALEMIA 06/01/2008   due to DM neparopathy  . HYPERLIPIDEMIA 06/13/2007  . HYPERTENSION 06/13/2007  . NEOPLASM, MALIGNANT, SKIN, FACE 06/01/2008  . Other chronic nonalcoholic liver disease 8/65/7846  . RENAL DISEASE 06/01/2008    Past Surgical History:  Procedure Laterality Date  . Carotid Duplex  09/12/2004  . CATARACT EXTRACTION    . CHOLECYSTECTOMY    . ESOPHAGOGASTRODUODENOSCOPY  06/28/1991  . FEMUR IM NAIL Right 07/17/2018  . HERNIA REPAIR     right Inguinal hernia  . Rest/Stress Cardiolite  01/22/2001     Home Medications:  Prior to Admission medications   Medication Sig Start Date End Date Taking? Authorizing Provider  amLODipine (NORVASC) 5 MG tablet Take 1 tablet (5 mg total) by mouth daily. 06/30/18  Yes [name], MD  aspirin EC 81 MG tablet Take 1 tablet (81 mg total) by mouth daily. 08/04/17  Yes [name], MD  atorvastatin (LIPITOR) 40 MG tablet Take 2 tablets (80 mg total) by mouth daily at 6 PM. 06/30/18  Yes [name], MD  carvedilol (COREG) 6.25 MG tablet Take 1 tablet (6.25 mg total) by mouth 2 (two) times daily with a meal. 06/30/18  Yes [name], MD  donepezil (ARICEPT) 5 MG tablet Take 1 tablet (5 mg total) by mouth at bedtime. 06/30/18  Yes [name], MD  lisinopril (PRINIVIL,ZESTRIL) 5 MG tablet Take 1 tablet (5 mg total) by mouth daily. 06/30/18  Yes [name], MD  LORazepam (ATIVAN) 0.5 MG tablet Take 1 tablet (0.5 mg total) by mouth daily as needed for anxiety. 06/30/18  Yes [name], MD  meclizine (ANTIVERT) 12.5 MG tablet TAKE 1 TABLET BY MOUTH THREE TIMES DAILY AS NEEDED FOR DIZZINESS Patient taking differently: Take 12.5 mg by mouth 3 (three) times daily as needed for dizziness.  06/30/18  Yes [name], MD  nitroGLYCERIN  (NITROSTAT) 0.4 MG SL tablet Place 1 tablet (0.4 mg total) under the tongue every 5 (five) minutes as needed. For chest pain 08/04/17  Yes [name], MD  repaglinide (PRANDIN) 0.5 MG tablet Take 1 tablet (0.5 mg total) by mouth 3 (three) times daily before meals. 06/30/18  Yes [name], MD  vitamin B-12 1000 MCG tablet Take 1 tablet (1,000 mcg total) by mouth daily. 03/03/18  Yes Purohit, Konrad Dolores, MD  calcium-vitamin D (OSCAL WITH D) 500-200 MG-UNIT tablet Take 1 tablet by mouth 3 (three) times daily. 07/17/18   Leandrew Koyanagi, MD  enoxaparin (LOVENOX) 40 MG/0.4ML injection Inject 0.4 mLs (40 mg total) into the skin daily. 07/17/18   Leandrew Koyanagi, MD  HYDROcodone-acetaminophen (NORCO) 7.5-325 MG tablet Take 1-2 tablets by mouth every 6 (six) hours as needed for moderate pain. 07/17/18   Leandrew Koyanagi, MD    Inpatient Medications: Scheduled Meds: . sodium chloride   Intravenous Once  . amLODipine  5 mg Oral Daily  . atorvastatin  80 mg Oral q1800  . carvedilol  6.25 mg Oral BID WC  . docusate sodium  100 mg Oral BID  . donepezil  5 mg Oral QHS  . enoxaparin (LOVENOX) injection  30 mg Subcutaneous Q24H  . furosemide  80 mg Intravenous Q12H  . insulin aspart  0-9 Units Subcutaneous Q4H  . mouth rinse  15 mL Mouth Rinse BID  . cyanocobalamin  1,000 mcg Oral Daily   Continuous Infusions: . sodium chloride    . ceFEPime (MAXIPIME) IV    . methocarbamol (ROBAXIN) IV     PRN Meds: sodium chloride, acetaminophen, alum & mag hydroxide-simeth, hydrALAZINE, HYDROcodone-acetaminophen, HYDROcodone-acetaminophen, LORazepam, magnesium citrate, menthol-cetylpyridinium **OR** phenol, methocarbamol **OR** methocarbamol (ROBAXIN) IV, metoprolol tartrate, morphine injection, morphine injection, ondansetron **OR** ondansetron (ZOFRAN) IV, polyethylene glycol, senna-docusate, sorbitol  Allergies:   No Known Allergies  Social History:   Social History   Socioeconomic History  . Marital status: Married     Spouse name: Not on file  . Number of children: Not on file  . Years of education: Not on file  . Highest education level: Not on file  Occupational History  . Occupation: Retired    Fish farm manager: RETIRED  Social Needs  . Financial resource strain: Not on file  . Food insecurity:    Worry: Not on file    Inability: Not on file  . Transportation needs:    Medical: Not on file    Non-medical: Not on file  Tobacco Use  . Smoking status: Former Smoker    Last attempt to quit: 11/18/1978    Years since quitting: 39.6  . Smokeless tobacco: Never Used  Substance and Sexual Activity  . Alcohol use: No  . Drug use: No  . Sexual activity: Not Currently  Lifestyle  . Physical activity:    Days per week: Not on file    Minutes per session: Not on file  . Stress: Not on file  Relationships  . Social connections:    Talks on phone: Not on file    Gets  together: Not on file    Attends religious service: Not on file    Active member of club or organization: Not on file    Attends meetings of clubs or organizations: Not on file    Relationship status: Not on file  . Intimate partner violence:    Fear of current or ex partner: Not on file    Emotionally abused: Not on file    Physically abused: Not on file    Forced sexual activity: Not on file  Other Topics Concern  . Not on file  Social History Narrative  . Not on file    Family History:    Family History  Problem Relation Age of Onset  . Vision loss Son      ROS:  Please see the history of present illness.   All other ROS reviewed and negative.     Physical Exam/Data:   Vitals:   07/19/18 1216 07/19/18 1300 07/19/18 1400 07/19/18 1426  BP:  (!) 148/95 (!) 187/149 121/67  Pulse:  88 (!) 124 86  Resp:  (!) 43 16 (!) 30  Temp: 97.9 F (36.6 C)     TempSrc: Oral     SpO2:  97% 91% 98%  Weight:      Height:        Intake/Output Summary (Last 24 hours) at 07/19/2018 1524 Last data filed at 07/19/2018 1500 Gross per 24  hour  Intake 858.82 ml  Output 4583 ml  Net -3724.18 ml   Filed Weights   07/17/18 0158 07/18/18 2100 07/19/18 0435  Weight: 80.7 kg 84.3 kg 84.1 kg   Body mass index is 25.15 kg/m.  General:  Well nourished, well developed, in no acute distress HEENT: normal Lymph: no adenopathy Neck: no JVD Endocrine:  No thryomegaly Vascular: No carotid bruits; FA pulses 2+ bilaterally without bruits  Cardiac:  normal S1, S2; irregular; no murmur  Lungs:  clear to auscultation bilaterally, no wheezing, rhonchi or rales  Abd: soft, nontender, no hepatomegaly  Ext: no edema Musculoskeletal:  No deformities, BUE and BLE strength normal and equal Skin: warm and dry  Neuro:  CNs 2-12 intact, no focal abnormalities noted Psych:  Normal affect   EKG:  The EKG was personally reviewed and demonstrates: Atrial fibrillation with RVR, atypical left bundle branch block (old) Telemetry:  Telemetry was personally reviewed and demonstrates: Atrial Fibrillation with a ventricular response mostly in the 80s and 90s.  Relevant CV Studies: Echo February 28, 2018  - Left ventricle: The cavity size was normal. There was mild   concentric hypertrophy. Systolic function was at the lower limits   of normal. The estimated ejection fraction was in the range of   50% to 55%. Possible mild hypokinesis of the   basal-midanterolateral myocardium. There was fusion of early and   atrial contributions to ventricular filling. The study is not   technically sufficient to allow evaluation of LV diastolic   function. - Aortic valve: Mild focal calcification involving the noncoronary   cusp. Valve area (Vmax): 2.2 cm^2. - Mitral valve: There was mild regurgitation. - Left atrium: The atrium was mildly dilated. - Pulmonary arteries: Systolic pressure was mildly increased. PA   peak pressure: 46 mm Hg (S).   Laboratory Data:  Chemistry Recent Labs  Lab 07/18/18 2014 07/18/18 2239 07/19/18 0422  NA 137 138 141  K  5.3* 5.3* 4.6  CL 111 110 113*  CO2 18* 20* 20*  GLUCOSE 163* 175* 150*  BUN  52* 55* 55*  CREATININE 2.49* 2.57* 2.70*  CALCIUM 8.1* 7.8* 8.1*  GFRNONAA 21* 20* 19*  GFRAA 24* 23* 22*  ANIONGAP 8 8 8     No results for input(s): PROT, ALBUMIN, AST, ALT, ALKPHOS, BILITOT in the last 168 hours. Hematology Recent Labs  Lab 07/17/18 0318 07/17/18 0321 07/18/18 0427 07/19/18 0422  WBC 7.5  --  7.9 12.0*  RBC 2.70*  --  2.37* 2.74*  HGB 8.4*  --  7.4* 8.7*  HCT 27.7*  --  23.6* 26.5*  MCV 102.6*  --  99.6 96.7  MCH 31.1  --  31.2 31.8  MCHC 30.3  --  31.4 32.8  RDW 13.5  --  13.5 14.1  PLT PLATELET CLUMPS NOTED ON SMEAR, COUNT APPEARS DECREASED 74* 73* 93*   Cardiac EnzymesNo results for input(s): TROPONINI in the last 168 hours. No results for input(s): TROPIPOC in the last 168 hours.  BNPNo results for input(s): BNP, PROBNP in the last 168 hours.  DDimer No results for input(s): DDIMER in the last 168 hours.  Radiology/Studies:  Dg Chest 1 View  Result Date: 07/17/2018 CLINICAL DATA:  84 year old male with fall. EXAM: CHEST  1 VIEW COMPARISON:  Chest radiograph dated 02/27/2018 and CT dated 02/27/2018 FINDINGS: There is patchy area of airspace opacity in the right lower lung field and infrahilar region. An area of opacity is also noted in the left lower lobe/retrocardiac area. Although these may the infectious etiology, persistent and interval development of left-sided pleural effusion since the prior radiograph is concerning for underlying neoplasm involving the left lower lobe. Further evaluation with chest CT with IV contrast is recommended. There is a small left pleural effusion. No pneumothorax. There is mild cardiomegaly. Atherosclerotic calcification of the aortic arch. No acute osseous pathology. IMPRESSION: Persistent airspace densities primarily involving the left lower lobe as well as in the right lower lung field with interval development or increase in the size of the  left-sided pleural effusion. Findings may be infectious in etiology but concerning for a neoplastic process in the left lower lobe. Further evaluation with CT with IV contrast is recommended. Electronically Signed   By: Anner Crete M.D.   On: 07/17/2018 03:33   Dg Lumbar Spine Complete  Result Date: 07/17/2018 CLINICAL DATA:  84 y/o  M; fall with right hip pain. EXAM: LUMBAR SPINE - COMPLETE 4+ VIEW COMPARISON:  01/12/2018 CT abdomen and pelvis FINDINGS: Five lumbar type non-rib-bearing vertebral bodies. Mild reverse S curvature of lumbar spine. Normal lumbar lordosis without listhesis. Stable L2 moderate compression deformity. No new fracture identified. Calcific aortic atherosclerosis. IMPRESSION: Stable chronic moderate L2 compression deformity. No acute fracture identified. Aortic atherosclerosis. Electronically Signed   By: Kristine Garbe M.D.   On: 07/17/2018 03:33   Ct Head Wo Contrast  Result Date: 07/17/2018 CLINICAL DATA:  84 y/o  M; vertical, episodic, peripheral. EXAM: CT HEAD WITHOUT CONTRAST TECHNIQUE: Contiguous axial images were obtained from the base of the skull through the vertex without intravenous contrast. COMPARISON:  02/27/2018 CT head FINDINGS: Brain: No evidence of acute infarction, hemorrhage, hydrocephalus, extra-axial collection or mass lesion/mass effect. Stable small chronic infarcts within the bilateral cerebellar hemispheres. Stable chronic microvascular ischemic changes and volume loss of the brain Vascular: Calcific atherosclerosis of the vertebral arteries and carotid siphons. No hyperdense vessel identified. Skull: Normal. Negative for fracture or focal lesion. Sinuses/Orbits: Mild ethmoid and maxillary sinus mucosal thickening. Normal aeration of mastoid air cells. Bilateral intra-ocular lens replacement. Other: None. IMPRESSION:  1. No acute intracranial abnormality identified. 2. Stable chronic microvascular ischemic changes and volume loss of the brain.  Stable small chronic infarcts in the bilateral cerebellar hemispheres. Electronically Signed   By: Kristine Garbe M.D.   On: 07/17/2018 03:37   Mr Hip Right Wo Contrast  Result Date: 07/17/2018 CLINICAL DATA:  Hip fracture EXAM: MR OF THE RIGHT HIP WITHOUT CONTRAST TECHNIQUE: Multiplanar, multisequence MR imaging was performed. No intravenous contrast was administered. COMPARISON:  07/17/2018 radiograph FINDINGS: Bones: Right hip intertrochanteric fracture with intersecting fracture extending transversely through the greater trochanter. The intertrochanteric portion is relatively nondisplaced. Thin band of accentuated T2 and low T1 signal in the vicinity of the anterior acetabular wall/lateral base of the superior pubic ramus as shown on images 2-5 of series 17, mild stress fracture is a distinct possibility but this is not felt to represent a completed fracture. No concomitant fracture of the inferior pubic ramus is identified. Presacral edema noted. The sacrum is not fully included on any of the sequences, and it is difficult to exclude a small transverse fracture at the level of the sacrococcygeal junction. Articular cartilage and labrum Articular cartilage:  No focal chondral defect. Labrum: Small anterior superior labral tear on images 18-19 of series 15. This is more prominent than I would expect for the normal recess. I also suspect a contralateral (left-sided) labral tear with paralabral cyst. Joint or bursal effusion Joint effusion:  Small right and trace left joint effusions. Bursae: Notable fluid in the right trochanteric bursa adjacent to the greater trochanteric fracture. There is also mild contralateral (left-sided) trochanteric bursitis. Muscles and tendons Muscles and tendons: Notable edema within along the gluteus medius, gluteus maximus, quadratus femoris, obturator externus, right hip adductor musculature, distal iliopsoas, and proximal vastus lateralis muscles. There is some lesser  edema in the proximal semitendinosus muscles bilaterally and in the right gluteus maximus. Note is made of hip adductor muscular edema on the contralateral, left side. There is also a small amount of edema tracking below the right iliacus muscle. Other findings Miscellaneous: There is potentially some low T2 signal in the left prostate peripheral zone on image 15/13. Sigmoid colon diverticulosis. Indirect right inguinal hernia contains loops of bowel extending down into the scrotum. Separate small bilateral scrotal hydroceles. IMPRESSION: 1. Acute intertrochanteric fracture of the right hip, with intersecting transverse greater trochanteric fracture. 2. Potential mild stress fracture/stress injury in the anterior wall of the right acetabulum. 3. Questionable transverse fracture of the sacrum near the sacrococcygeal junction. There is some presacral edema in this vicinity and questionable osseous edema. Today's exam was not optimized to assess the sacrum and the sacrum is only seen in a very disadvantageous manner which reduces positive predictive value. 4. Abnormal edema in multiple muscle groups in the vicinity of the right hip as described above. Considerable right trochanteric bursitis some of which is likely posttraumatic. Small right and trace left hip joint effusions. 5. Bilateral acetabular labral tears. 6. Left-sided (contralateral) trochanteric bursitis. 7. Low T2 signal in the left peripheral zone of the prostate gland noted. This could be postinflammatory but can also be from prostate cancer. Correlate with PSA level. 8. Indirect right inguinal hernia contains loops of bowel extending down into the scrotum. Separate small bilateral scrotal hydroceles. Electronically Signed   By: Van Clines M.D.   On: 07/17/2018 08:18   Dg Chest Port 1 View  Result Date: 07/18/2018 CLINICAL DATA:  Hypoxemia per ordering notes. EXAM: PORTABLE CHEST 1 VIEW COMPARISON:  07/17/2018 FINDINGS:  There are perihilar  infiltrates bilaterally, confluent at the LEFT lung base. Heart size is UPPER normal. There is atherosclerotic calcification of the thoracic aorta. Small bilateral pleural effusions are present. Patient is kyphotic. IMPRESSION: Bilateral pulmonary infiltrates, most consistent with asymmetric pulmonary edema. Infection could have a similar appearance. Aortic atherosclerosis. (ICD10-I70.0) Electronically Signed   By: Nolon Nations M.D.   On: 07/18/2018 18:27   Dg Shoulder Left  Result Date: 07/18/2018 CLINICAL DATA:  84 year old male with history of recent fall complaining of pain in the left shoulder. EXAM: LEFT SHOULDER - 2+ VIEW COMPARISON:  No priors. FINDINGS: Two views of the left shoulder demonstrate a mildly impacted nondisplaced fracture of the distal aspect of the left clavicle. Visualized portions of the scapula appear intact. Old healed fracture with posttraumatic deformity in the mid humeral diaphysis. IMPRESSION: 1. Mildly impacted nondisplaced fracture of the distal aspect of the left clavicle just medial to the acromioclavicular joint. Electronically Signed   By: Vinnie Langton M.D.   On: 07/18/2018 18:01   Dg Humerus Left  Result Date: 07/18/2018 CLINICAL DATA:  84 year old male with history of recent falls complaining of pain in the left shoulder. EXAM: LEFT HUMERUS - 2+ VIEW COMPARISON:  Left humerus radiograph 05/24/2012. FINDINGS: Acute impacted nondisplaced fracture of the distal aspect of the left clavicle, incompletely imaged (please see dedicated left shoulder radiographs for full description). Old ununited fracture of the mid humeral diaphysis with posttraumatic deformity. No acute displaced fracture of the left humerus. IMPRESSION: 1. No acute abnormality of the left humerus. 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 Electronically Signed   By: Vinnie Langton M.D.   On: 07/18/2018 18:03   Dg C-arm 1-60 Min  Result Date:  07/17/2018 CLINICAL DATA:  IM nail fixation of right femur EXAM: DG C-ARM 61-120 MIN; RIGHT FEMUR 2 VIEWS COMPARISON:  MRI 07/17/2018 FINDINGS: 74 seconds of fluoroscopic time was utilized. AP and frog-leg lateral views of the hip and distal femur were acquired demonstrating presence of an intramedullary nail extending to the distal metadiaphysis of the right femur. No complicating features. Fine bony detail is limited by the C-arm fluoroscopic technique. IMPRESSION: 74 seconds of fluoroscopic time utilized for placement of intramedullary nail within the right femur. No immediate intraoperative complicating features are noted. Electronically Signed   By: Ashley Royalty M.D.   On: 07/17/2018 14:08   Dg Hip Unilat With Pelvis 2-3 Views Right  Result Date: 07/17/2018 CLINICAL DATA:  84 y/o  M; fall with right hip pain. EXAM: DG HIP (WITH OR WITHOUT PELVIS) 2-3V RIGHT COMPARISON:  None. FINDINGS: Lucency traversing the greater trochanter likely representing a mildly displaced fracture. No additional proximal femur fracture identified. No appreciable pelvic fracture or pelvic diastasis. Vascular calcifications noted. IMPRESSION: Mildly displaced acute fracture of the right femur greater trochanter. No additional fracture identified. Consider further evaluation with right hip CT. Electronically Signed   By: Kristine Garbe M.D.   On: 07/17/2018 03:31   Dg Femur, Min 2 Views Right  Result Date: 07/17/2018 CLINICAL DATA:  IM nail fixation of right femur EXAM: DG C-ARM 61-120 MIN; RIGHT FEMUR 2 VIEWS COMPARISON:  MRI 07/17/2018 FINDINGS: 74 seconds of fluoroscopic time was utilized. AP and frog-leg lateral views of the hip and distal femur were acquired demonstrating presence of an intramedullary nail extending to the distal metadiaphysis of the right femur. No complicating features. Fine bony detail is limited by the C-arm fluoroscopic technique. IMPRESSION: 74 seconds of  fluoroscopic time utilized for  placement of intramedullary nail within the right femur. No immediate intraoperative complicating features are noted. Electronically Signed   By: Ashley Royalty M.D.   On: 07/17/2018 14:08    Assessment and Plan:   1. AFib: Increase his carvedilol to achieve better rate control.  Decrease the amlodipine to compensate for beta-blocker effect on blood pressure. CHADSVasc 8 (age 42, CVA 2, DM, HTN, CHF, CAD), risk for embolic stroke.  On the other hand he has an unsteady gait and has proven that he can have serious falls and injuries.  Renal insufficiency also increases his risk of bleeding complications. HAS-BLED 4.  Absolute stroke reduction 10.4%, annual risk of major bleed 6.5% chance of benefit with apixaban 1 and 10; chance of harm by apixaban due to major bleed 1 and 17. for the time being, I think he deserves to be on oral anticoagulants, to provide protection both from embolic stroke and venous thromboembolic disease following his hip fracture/surgery.  In the long run, it's very difficult to say where the risk/benefit lies. 2. CAD: Asymptomatic even when he was markedly tachycardic.  No evaluation necessary at this time.  He should not receive both aspirin and Eliquis.  He is on pravastatin. 3. CHF: Problems with acute exacerbation of heart failure in the setting of postoperative hyperadrenergic state and blood transfusion, even before onset of atrial fibrillation.  Currently appears to be euvolemic.  Was not routinely receiving diuretics prior to admission.  Stop lisinopril due to slight worsening of renal function 4. Acute on CKD4: Renal function showed a tendency to worsen even before he received diuretics.  Monitor carefully.  Use lower dose anticoagulant adjusted for age and renal dysfunction.   For questions or updates, please contact [place] Please consult [email or web address] for contact info under Cardiology/STEMI.   Signed, Sanda Klein, MD  07/19/2018 3:24 PM

## 2018-07-19 NOTE — Progress Notes (Addendum)
     Subjective: 2 Days Post-Op Procedure(s) (LRB): INTRAMEDULLARY (IM) NAIL INTERTROCHANTRIC (N/A) In ICU, awake, alert and oriented x 3. No respiratory distress today, lasix diuresis with good results. Asked by hospitalist to assess left shoulder new finding on shoulder xray left distal clavicle fracture. 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   Patient reports pain as moderate.    Objective:   VITALS:  Temp:  [97 F (36.1 C)-98.8 F (37.1 C)] 97.9 F (36.6 C) (09/01 1216) Pulse Rate:  [71-95] 86 (09/01 1200) Resp:  [13-36] 29 (09/01 1200) BP: (84-193)/(51-96) 151/96 (09/01 1200) SpO2:  [94 %-100 %] 96 % (09/01 1200) FiO2 (%):  [30 %-40 %] 30 % (09/01 0048) Weight:  [84.1 kg-84.3 kg] 84.1 kg (09/01 0435)  Neurologically intact ABD soft Neurovascular intact Sensation intact distally Intact pulses distally Dorsiflexion/Plantar flexion intact Incision: dressing C/D/I and no drainage No cellulitis present   LABS Recent Labs    07/17/18 0318  07/18/18 0427 07/19/18 0422  HGB 8.4*  --  7.4* 8.7*  WBC 7.5  --  7.9 12.0*  PLT PLATELET CLUMPS NOTED ON SMEAR, COUNT APPEARS DECREASED   < > 73* 93*   < > = values in this interval not displayed.   Recent Labs    07/18/18 2239 07/19/18 0422  NA 138 141  K 5.3* 4.6  CL 110 113*  CO2 20* 20*  BUN 55* 55*  CREATININE 2.57* 2.70*  GLUCOSE 175* 150*   Recent Labs    07/17/18 0318 07/18/18 2014  INR 1.13 1.20     Assessment/Plan: 2 Days Post-Op Procedure(s) (LRB): INTRAMEDULLARY (IM) NAIL INTERTROCHANTRIC (N/A) Multiple fractures left clavicle, right intertrochanteric, old lumbar compression fracture, old left humerus fracture. Check Vitamin D,  and osteoporosis studies post hospital. May be due to decrease renal function with secondary hyperparathryoidism.   Advance diet Up with therapy Discharge to SNF when stable. Left impacted distal clavicle fracture 2 cm  proximal to [facility] joint without elevation, no displacement, some  Early callus. Recommend sling for the left shoulder when this patient is up and out of bed, while in bed the sling may not be necessary. The motion of the left Chronic mid humerus is 86 old and not to be treated.   Basil Dess 07/19/2018, 1:02 PMPatient ID: Chad Mcmillan, male   DOB: [birth year], 84 y.o.   MRN: [id number]

## 2018-07-19 NOTE — Progress Notes (Signed)
Pt went into new afib with RVR, HR 130s-140s. EKG obtained and placed in chart. Regalado, MD notified. 5mg  IV metoprolol push given. Will continue to monitor. Clint Bolder, RN 07/19/18 1:20 PM

## 2018-07-19 NOTE — Progress Notes (Signed)
[facility] Progress Note Patient Name: Chad Mcmillan DOB: [birth year] MRN: [id number]   Date of Service  07/19/2018  HPI/Events of Note  hyperkalemia  eICU Interventions  kayexalate     Intervention Category Intermediate Interventions: Electrolyte abnormality - evaluation and management  Sharia Reeve 07/19/2018, 12:01 AM

## 2018-07-20 ENCOUNTER — Other Ambulatory Visit (HOSPITAL_COMMUNITY): Payer: Medicare Other

## 2018-07-20 DIAGNOSIS — N179 Acute kidney failure, unspecified: Secondary | ICD-10-CM

## 2018-07-20 DIAGNOSIS — Z951 Presence of aortocoronary bypass graft: Secondary | ICD-10-CM

## 2018-07-20 DIAGNOSIS — I5031 Acute diastolic (congestive) heart failure: Secondary | ICD-10-CM

## 2018-07-20 DIAGNOSIS — D649 Anemia, unspecified: Secondary | ICD-10-CM

## 2018-07-20 DIAGNOSIS — I5032 Chronic diastolic (congestive) heart failure: Secondary | ICD-10-CM

## 2018-07-20 DIAGNOSIS — I480 Paroxysmal atrial fibrillation: Secondary | ICD-10-CM

## 2018-07-20 LAB — BASIC METABOLIC PANEL
ANION GAP: 7 (ref 5–15)
BUN: 57 mg/dL — ABNORMAL HIGH (ref 8–23)
CALCIUM: 7.6 mg/dL — AB (ref 8.9–10.3)
CO2: 24 mmol/L (ref 22–32)
Chloride: 110 mmol/L (ref 98–111)
Creatinine, Ser: 2.77 mg/dL — ABNORMAL HIGH (ref 0.61–1.24)
GFR, EST AFRICAN AMERICAN: 21 mL/min — AB (ref >60)
GFR, EST NON AFRICAN AMERICAN: 18 mL/min — AB (ref >60)
GLUCOSE: 130 mg/dL — AB (ref 70–99)
Potassium: 3.4 mmol/L — ABNORMAL LOW (ref 3.5–5.1)
Sodium: 141 mmol/L (ref 135–145)

## 2018-07-20 LAB — GLUCOSE, CAPILLARY
GLUCOSE-CAPILLARY: 138 mg/dL — AB (ref 70–99)
GLUCOSE-CAPILLARY: 158 mg/dL — AB (ref 70–99)
GLUCOSE-CAPILLARY: 174 mg/dL — AB (ref 70–99)
GLUCOSE-CAPILLARY: 179 mg/dL — AB (ref 70–99)
Glucose-Capillary: 129 mg/dL — ABNORMAL HIGH (ref 70–99)

## 2018-07-20 LAB — CBC
HCT: 23 % — ABNORMAL LOW (ref 39.0–52.0)
Hemoglobin: 7.5 g/dL — ABNORMAL LOW (ref 13.0–17.0)
MCH: 31.3 pg (ref 26.0–34.0)
MCHC: 32.6 g/dL (ref 30.0–36.0)
MCV: 95.8 fL (ref 78.0–100.0)
Platelets: 81 10*3/uL — ABNORMAL LOW (ref 150–400)
RBC: 2.4 MIL/uL — ABNORMAL LOW (ref 4.22–5.81)
RDW: 14 % (ref 11.5–15.5)
WBC: 7.7 10*3/uL (ref 4.0–10.5)

## 2018-07-20 MED ORDER — AMLODIPINE BESYLATE 5 MG PO TABS: 5.0000 mg | ORAL_TABLET | Freq: Every day | ORAL | Status: DC

## 2018-07-20 MED ORDER — AMLODIPINE BESYLATE 2.5 MG PO TABS: 2.5000 mg | ORAL_TABLET | Freq: Every day | ORAL | Status: DC

## 2018-07-20 MED ORDER — POLYETHYLENE GLYCOL 3350 17 G PO PACK
17.0000 g | PACK | Freq: Every day | ORAL | Status: DC
  Administered 2018-07-20 – 2018-07-24 (×5): 17 g via ORAL
  Filled 2018-07-20 (×5): qty 1

## 2018-07-20 MED ORDER — POTASSIUM CHLORIDE CRYS ER 20 MEQ PO TBCR
20.0000 meq | EXTENDED_RELEASE_TABLET | Freq: Once | ORAL | Status: AC
  Administered 2018-07-20: 20 meq via ORAL
  Filled 2018-07-20: qty 1

## 2018-07-20 MED ORDER — FERROUS SULFATE 325 (65 FE) MG PO TABS
325.0000 mg | ORAL_TABLET | Freq: Two times a day (BID) | ORAL | Status: DC
  Administered 2018-07-20 – 2018-07-24 (×8): 325 mg via ORAL
  Filled 2018-07-20 (×8): qty 1

## 2018-07-20 MED ORDER — AMLODIPINE BESYLATE 5 MG PO TABS
5.0000 mg | ORAL_TABLET | Freq: Every day | ORAL | Status: DC
  Administered 2018-07-20: 5 mg via ORAL
  Filled 2018-07-20 (×2): qty 1

## 2018-07-20 NOTE — Progress Notes (Addendum)
PROGRESS NOTE    Chad Mcmillan  EKC:[id number] DOB: 08/06/1924 DOA: 07/17/2018 PCP: [name], MD    Brief Narrative by Dr Myna Hidalgo: Chad Mcmillan is a 84 y.o. male with medical history significant for coronary artery disease, history of CVA, type 2 diabetes mellitus, hypertension, chronic kidney disease stage IV, chronic anemia, thrombocytopenia, and dementia, now presenting to the emergency department with severe right hip pain after fall at home.  The patient reports that he has been experiencing mild dyspnea and occasional cough, but no chest pain, fevers, or chills.  He was walking with his cane last night when he tripped and fell onto his right side, denies hitting his head or losing consciousness.  He was experiencing pain in the low back, and particularly in the right hip.  He was unable to get up due to the severe pain in his right hip.  His son, with whom he lives, called EMS and the patient was treated with fentanyl prior to arrival in the ED.  ED Course: Upon arrival to the ED, patient is found to be afebrile, saturating well on room air, and with blood pressure 180/80.  EKG features a sinus rhythm with first-degree AV nodal block and LVH with repolarization abnormality.  Radiographs of the right hip are concerning for mildly displaced acute fracture of the right femoral greater trochanter.  There is no acute fracture on lumbar spine radiographs.  Chest x-ray is notable for persistent airspace densities primarily involving the left lower lobe where there is interval increase in a pleural effusion suggestive of infection or neoplasia.  Noncontrast head CT is negative for acute intracranial abnormality.  Chemistry panel is notable for a creatinine of 2.18, similar to priors.  CBC features a microcytic anemia with hemoglobin of 8.4, down from the 9-11 range several months ago.  Platelets are clumped on the CBC.  Type and screen was performed, fentanyl was administered, and orthopedic surgery was  consulted by the ED physician with recommendation to admit to medicine, keep the patient n.p.o., and MRI the right hip.''  Assessment & Plan:   Principal Problem:   Displaced fracture of greater trochanter of right femur, initial encounter for closed fracture [facility]) Active Problems:   Essential hypertension   History of stroke   DM (diabetes mellitus) ([name])   Anemia   CKD (chronic kidney disease), stage IV ([name])   Dementia   Closed displaced fracture of greater trochanter of right femur ([name])   History of thrombocytopenia   Pleural effusion, left   Abnormal finding on chest xray   CAP (community acquired pneumonia)   Respiratory failure with hypoxia ([name])   Closed fracture of acromial end of left clavicle   1-Acute hypoxic Respiratory Failure; Pulmonary edema vs less likely  PNA Chest x ray with asymmetric edema, Vs PNA>  Became more hypoxic, tachypnea.  Place on 15 L oxygen--- then BIPAP.  CCM consulted. Appreciate assistance.  Now off BIPAP. Good urine out put.  Transfer to step down.  Discontinue vancomycin. Continue with cefepime for now.  Treated with IV lasix. Hold lasix today.   Acute fracture of right greater trochanter;  Mechanical fall at home.  -X ray; suggest mildly displaced fracture of right greater trochanter  -Underwent to surgery 8-30. Intramedullary implant.  -PT per ortho.   A fib, new onset.  Suspect related to acute illness.  Continue with carvedilol. Increases dose. .  IV metoprolol PRN.  Cardiology consulted.  Started on eliquis. Monitor.   Hyperkalemia; in  setting of renal failure;  Calcium gluconate given. Insulin, D 50, Bicarb Kayexalate times 3.  Resolved.   Anemia, thrombocytopenia;  Hb ranges 9--11.  Has history of thrombocytopenia.  Continue with B 12 supplementation.  History of thrombocytopenia. But 3 month ago platelet were normal.  S/P transfusion 8-31. Didn't complete transfusion.  Monitor for bleeding. Repeat hb in am.  Stool  brown. Start ferrous sulfate   Left pleural effusion,  PNA;  - CXR with airspace opacities bilaterally and left-sided pleural effusion  -needs follow up chest x ray to document resolution.  -antibiotics change to cefepime and vancomycin.  MRSA PCR negative,  Vancomycin topped .   Acute on CKD stage  IV;  Cr baseline 1.9--2.0 Increase cr to. 2.7.  Monitor urine out put.  Holding lasix today  DM type II;  Hold prandin.  SSI.   HTN;  Continue with coreg, Norvasc.  [name] hydralazine.   History of CVA;  Continue with lipitor. Aspirin on hold for sx.   Dementia; continue with Aricept.   Bradycardia;  Had an episode of bradycardia while getting up with PT.  Monitor on telemetry.  Correct hyperkalemia.  Holder parameter for carvedilol.   Left Upper extremity deformity; chronic but he has been complaining of shoulder pain.  -X ray; Mildly impacted nondisplaced fracture of the distal aspect of the left clavicle just medial to the acromioclavicular joint. Humerus x ray ; iOld ununited fracture of the mid humeral diaphysis with chronic posttraumatic deformity. Sling PRN   Hypokalemia; replete   DVT prophylaxis: SCD Code Status: full code.  Family Communication: Son updated over phone.  Disposition Plan:  He will need rehab.   Consultants:   Ortho    Procedures:   Sx, hip repair    Antimicrobials:   Prophylaxis.    Subjective: He is doing better, breathing better.  On RA  Objective: Vitals:   07/20/18 0347 07/20/18 0500 07/20/18 0725 07/20/18 0732  BP:   (!) 185/68 (!) 192/78  Pulse:    81  Resp:    20  Temp: 99.1 F (37.3 C)     TempSrc: Oral     SpO2:    91%  Weight:  79.6 kg    Height:        Intake/Output Summary (Last 24 hours) at 07/20/2018 0806 Last data filed at 07/20/2018 0700 Gross per 24 hour  Intake 460 ml  Output 3439.8 ml  Net -2979.8 ml   Filed Weights   07/18/18 2100 07/19/18 0435 07/20/18 0500  Weight: 84.3 kg 84.1 kg 79.6 kg     Examination:  General exam: NAD Respiratory system: CTA Cardiovascular system: S 1, S 2 IRR Gastrointestinal system: BS present, soft, nt Central nervous system:alert, follows command.  Extremities:Left LE with dressing  Skin: No rashes, lesions or ulcers   Data Reviewed: I have personally reviewed following labs and imaging studies  CBC: Recent Labs  Lab 07/17/18 0318 07/17/18 0812 07/18/18 0427 07/19/18 0422 07/20/18 0330  WBC 7.5  --  7.9 12.0* 7.7  NEUTROABS 5.8  --   --   --   --   HGB 8.4*  --  7.4* 8.7* 7.5*  HCT 27.7*  --  23.6* 26.5* 23.0*  MCV 102.6*  --  99.6 96.7 95.8  PLT PLATELET CLUMPS NOTED ON SMEAR, COUNT APPEARS DECREASED 74* 73* 93* 81*   Basic Metabolic Panel: Recent Labs  Lab 07/18/18 1228 07/18/18 2014 07/18/18 2239 07/19/18 0422 07/20/18 0330  NA 139 137 138  141 141  K 5.6* 5.3* 5.3* 4.6 3.4*  CL 113* 111 110 113* 110  CO2 17* 18* 20* 20* 24  GLUCOSE 184* 163* 175* 150* 130*  BUN 46* 52* 55* 55* 57*  CREATININE 2.50* 2.49* 2.57* 2.70* 2.77*  CALCIUM 8.1* 8.1* 7.8* 8.1* 7.6*   GFR: Estimated Creatinine Clearance: 18.7 mL/min (A) (by C-G formula based on SCr of 2.77 mg/dL (H)). Liver Function Tests: No results for input(s): AST, ALT, ALKPHOS, BILITOT, PROT, ALBUMIN in the last 168 hours. No results for input(s): LIPASE, AMYLASE in the last 168 hours. No results for input(s): AMMONIA in the last 168 hours. Coagulation Profile: Recent Labs  Lab 07/17/18 0318 07/18/18 2014  INR 1.13 1.20   Cardiac Enzymes: No results for input(s): CKTOTAL, CKMB, CKMBINDEX, TROPONINI in the last 168 hours. BNP (last 3 results) No results for input(s): PROBNP in the last 8760 hours. HbA1C: No results for input(s): HGBA1C in the last 72 hours. CBG: Recent Labs  Lab 07/19/18 0749 07/19/18 1207 07/19/18 1756 07/20/18 0017 07/20/18 0804  GLUCAP 153* 191* 196* 138* 158*   Lipid Profile: No results for input(s): CHOL, HDL, LDLCALC, TRIG,  CHOLHDL, LDLDIRECT in the last 72 hours. Thyroid Function Tests: No results for input(s): TSH, T4TOTAL, FREET4, T3FREE, THYROIDAB in the last 72 hours. Anemia Panel: No results for input(s): VITAMINB12, FOLATE, FERRITIN, TIBC, IRON, RETICCTPCT in the last 72 hours. Sepsis Labs: Recent Labs  Lab 07/18/18 2014 07/18/18 2239  PROCALCITON 0.28  --   LATICACIDVEN 1.5 1.2    Recent Results (from the past 240 hour(s))  MRSA PCR Screening     Status: None   Collection Time: 07/18/18 12:00 AM  Result Value Ref Range Status   MRSA by PCR NEGATIVE NEGATIVE Final    Comment:        The GeneXpert MRSA Assay (FDA approved for NASAL specimens only), is one component of a comprehensive MRSA colonization surveillance program. It is not intended to diagnose MRSA infection nor to guide or monitor treatment for MRSA infections. Performed at [facility], [place] [address], [place], [place] [address]   Culture, blood (x 2)     Status: None (Preliminary result)   Collection Time: 07/18/18  8:14 PM  Result Value Ref Range Status   Specimen Description BLOOD RIGHT ARM  Final   Special Requests   Final    BOTTLES DRAWN AEROBIC ONLY Blood Culture adequate volume   Culture   Final    NO GROWTH 2 DAYS Performed at [facility], [place] [address], [place], [place] [address]    Report Status PENDING  Incomplete  Culture, blood (x 2)     Status: None (Preliminary result)   Collection Time: 07/18/18  8:18 PM  Result Value Ref Range Status   Specimen Description BLOOD LEFT ARM  Final   Special Requests   Final    BOTTLES DRAWN AEROBIC AND ANAEROBIC Blood Culture adequate volume   Culture   Final    NO GROWTH 2 DAYS Performed at [facility], [address] [place], [place] [address]    Report Status PENDING  Incomplete         Radiology Studies: Dg Chest Port 1 View  Result Date: 07/18/2018 CLINICAL DATA:  Hypoxemia per ordering notes. EXAM: PORTABLE CHEST 1 VIEW COMPARISON:   07/17/2018 FINDINGS: There are perihilar infiltrates bilaterally, confluent at the LEFT lung base. Heart size is UPPER normal. There is atherosclerotic calcification of the thoracic  aorta. Small bilateral pleural effusions are present. Patient is kyphotic. IMPRESSION: Bilateral pulmonary infiltrates, most consistent with asymmetric pulmonary edema. Infection could have a similar appearance. Aortic atherosclerosis. (ICD10-I70.0) Electronically Signed   By: Nolon Nations M.D.   On: 07/18/2018 18:27   Dg Shoulder Left  Result Date: 07/18/2018 CLINICAL DATA:  84 year old male with history of recent fall complaining of pain in the left shoulder. EXAM: LEFT SHOULDER - 2+ VIEW COMPARISON:  No priors. FINDINGS: Two views of the left shoulder demonstrate a mildly impacted nondisplaced fracture of the distal aspect of the left clavicle. Visualized portions of the scapula appear intact. Old healed fracture with posttraumatic deformity in the mid humeral diaphysis. IMPRESSION: 1. Mildly impacted nondisplaced fracture of the distal aspect of the left clavicle just medial to the acromioclavicular joint. Electronically Signed   By: Vinnie Langton M.D.   On: 07/18/2018 18:01   Dg Humerus Left  Result Date: 07/18/2018 CLINICAL DATA:  84 year old male with history of recent falls complaining of pain in the left shoulder. EXAM: LEFT HUMERUS - 2+ VIEW COMPARISON:  Left humerus radiograph 05/24/2012. FINDINGS: Acute impacted nondisplaced fracture of the distal aspect of the left clavicle, incompletely imaged (please see dedicated left shoulder radiographs for full description). Old ununited fracture of the mid humeral diaphysis with posttraumatic deformity. No acute displaced fracture of the left humerus. IMPRESSION: 1. No acute abnormality of the left humerus. 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 Electronically Signed   By: Vinnie Langton M.D.   On: 07/18/2018 18:03         Scheduled Meds: . sodium chloride   Intravenous Once  . amLODipine  2.5 mg Oral Daily  . apixaban  2.5 mg Oral BID  . atorvastatin  80 mg Oral q1800  . carvedilol  12.5 mg Oral BID WC  . docusate sodium  100 mg Oral BID  . donepezil  5 mg Oral QHS  . furosemide  80 mg Intravenous Q12H  . insulin aspart  0-9 Units Subcutaneous Q4H  . mouth rinse  15 mL Mouth Rinse BID  . potassium chloride  20 mEq Oral Once  . cyanocobalamin  1,000 mcg Oral Daily   Continuous Infusions: . sodium chloride    . ceFEPime (MAXIPIME) IV Stopped (07/19/18 1758)  . methocarbamol (ROBAXIN) IV       LOS: 3 days    Time spent: 35 minutes.     Elmarie Shiley, MD [facility] Pager [phone]  If 7PM-7AM, please contact night-coverage [email or web address] Password [email or web address] 07/20/2018, 8:06 AM

## 2018-07-20 NOTE — Progress Notes (Signed)
Patient in ICU Heart rate okay regular rhythm Hemoglobin 7.5 likely postop in nature Patient on Eliquis for DVT prophylaxis and treatment of A. Fib Will likely be transferred to floor today. Left shoulder asymptomatic Right hip minimal pain

## 2018-07-20 NOTE — Progress Notes (Signed)
RN called report to [facility]. Family was notified that pt transferred to [facility]. Pt had no belongings with him, hospital supplies sent with pt.  Clint Bolder, RN 07/20/18 3:37 PM

## 2018-07-20 NOTE — Progress Notes (Signed)
Progress Note  Patient Name: Chad Mcmillan Date of Encounter: 07/20/2018  Primary Cardiologist: No primary care provider on file.   Subjective   No complaints today. No chest pain. Feels his breathing is ok. Did notice some "fluttering" yesterday when he was in afib with RVR. No obvious bleeding that he knows of.  Inpatient Medications    Scheduled Meds: . sodium chloride   Intravenous Once  . amLODipine  2.5 mg Oral Daily  . apixaban  2.5 mg Oral BID  . atorvastatin  80 mg Oral q1800  . carvedilol  12.5 mg Oral BID WC  . docusate sodium  100 mg Oral BID  . donepezil  5 mg Oral QHS  . furosemide  80 mg Intravenous Q12H  . insulin aspart  0-9 Units Subcutaneous Q4H  . mouth rinse  15 mL Mouth Rinse BID  . potassium chloride  20 mEq Oral Once  . cyanocobalamin  1,000 mcg Oral Daily   Continuous Infusions: . sodium chloride    . ceFEPime (MAXIPIME) IV Stopped (07/19/18 1758)  . methocarbamol (ROBAXIN) IV     PRN Meds: sodium chloride, acetaminophen, alum & mag hydroxide-simeth, hydrALAZINE, HYDROcodone-acetaminophen, HYDROcodone-acetaminophen, LORazepam, magnesium citrate, menthol-cetylpyridinium **OR** phenol, methocarbamol **OR** methocarbamol (ROBAXIN) IV, metoprolol tartrate, morphine injection, morphine injection, ondansetron **OR** ondansetron (ZOFRAN) IV, polyethylene glycol, senna-docusate, sorbitol   Vital Signs    Vitals:   07/20/18 0347 07/20/18 0500 07/20/18 0725 07/20/18 0732  BP:   (!) 185/68 (!) 192/78  Pulse:    81  Resp:    20  Temp: 99.1 F (37.3 C)     TempSrc: Oral     SpO2:    91%  Weight:  79.6 kg    Height:        Intake/Output Summary (Last 24 hours) at 07/20/2018 0757 Last data filed at 07/20/2018 0700 Gross per 24 hour  Intake 580 ml  Output 3681.4 ml  Net -3101.4 ml   Filed Weights   07/18/18 2100 07/19/18 0435 07/20/18 0500  Weight: 84.3 kg 84.1 kg 79.6 kg    Telemetry    Alternates between NSR with 1st degree AV block, LBBB with  PACs/PVCs and atrial fibrillation. Episode of afib yesterday with RVR - Personally Reviewed  ECG    Yesterday at 13:06 afib with RVR at rate of 130 bpm - Personally Reviewed  Physical Exam   GEN: No acute distress.   Neck: supple, JVD 7 cm Cardiac: regular S1 and S2 with intermittent early beats, no murmurs, rubs, or gallops.  Respiratory: Clear to auscultation bilaterally. GI: Soft, nontender, non-distended. Bowel sounds normal MS: Trace LE edema; No deformity. Neuro:  Nonfocal, moves all limbs independently Psych: Normal affect   Labs    Chemistry Recent Labs  Lab 07/18/18 2239 07/19/18 0422 07/20/18 0330  NA 138 141 141  K 5.3* 4.6 3.4*  CL 110 113* 110  CO2 20* 20* 24  GLUCOSE 175* 150* 130*  BUN 55* 55* 57*  CREATININE 2.57* 2.70* 2.77*  CALCIUM 7.8* 8.1* 7.6*  GFRNONAA 20* 19* 18*  GFRAA 23* 22* 21*  ANIONGAP 8 8 7      Hematology Recent Labs  Lab 07/18/18 0427 07/19/18 0422 07/20/18 0330  WBC 7.9 12.0* 7.7  RBC 2.37* 2.74* 2.40*  HGB 7.4* 8.7* 7.5*  HCT 23.6* 26.5* 23.0*  MCV 99.6 96.7 95.8  MCH 31.2 31.8 31.3  MCHC 31.4 32.8 32.6  RDW 13.5 14.1 14.0  PLT 73* 93* 81*    Cardiac  EnzymesNo results for input(s): TROPONINI in the last 168 hours. No results for input(s): TROPIPOC in the last 168 hours.   BNPNo results for input(s): BNP, PROBNP in the last 168 hours.   DDimer No results for input(s): DDIMER in the last 168 hours.   Radiology    Dg Chest Port 1 View  Result Date: 07/18/2018 CLINICAL DATA:  Hypoxemia per ordering notes. EXAM: PORTABLE CHEST 1 VIEW COMPARISON:  07/17/2018 FINDINGS: There are perihilar infiltrates bilaterally, confluent at the LEFT lung base. Heart size is UPPER normal. There is atherosclerotic calcification of the thoracic aorta. Small bilateral pleural effusions are present. Patient is kyphotic. IMPRESSION: Bilateral pulmonary infiltrates, most consistent with asymmetric pulmonary edema. Infection could have a similar  appearance. Aortic atherosclerosis. (ICD10-I70.0) Electronically Signed   By: Nolon Nations M.D.   On: 07/18/2018 18:27   Dg Shoulder Left  Result Date: 07/18/2018 CLINICAL DATA:  84 year old male with history of recent fall complaining of pain in the left shoulder. EXAM: LEFT SHOULDER - 2+ VIEW COMPARISON:  No priors. FINDINGS: Two views of the left shoulder demonstrate a mildly impacted nondisplaced fracture of the distal aspect of the left clavicle. Visualized portions of the scapula appear intact. Old healed fracture with posttraumatic deformity in the mid humeral diaphysis. IMPRESSION: 1. Mildly impacted nondisplaced fracture of the distal aspect of the left clavicle just medial to the acromioclavicular joint. Electronically Signed   By: Vinnie Langton M.D.   On: 07/18/2018 18:01   Dg Humerus Left  Result Date: 07/18/2018 CLINICAL DATA:  84 year old male with history of recent falls complaining of pain in the left shoulder. EXAM: LEFT HUMERUS - 2+ VIEW COMPARISON:  Left humerus radiograph 05/24/2012. FINDINGS: Acute impacted nondisplaced fracture of the distal aspect of the left clavicle, incompletely imaged (please see dedicated left shoulder radiographs for full description). Old ununited fracture of the mid humeral diaphysis with posttraumatic deformity. No acute displaced fracture of the left humerus. IMPRESSION: 1. No acute abnormality of the left humerus. 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 Electronically Signed   By: Vinnie Langton M.D.   On: 07/18/2018 18:03    Cardiac Studies   Echo February 28, 2018  - Left ventricle: The cavity size was normal. There was mild concentric hypertrophy. Systolic function was at the lower limits of normal. The estimated ejection fraction was in the range of 50% to 55%. Possible mild hypokinesis of the basal-midanterolateral myocardium. There was fusion of early and atrial contributions  to ventricular filling. The study is not technically sufficient to allow evaluation of LV diastolic function. - Aortic valve: Mild focal calcification involving the noncoronary cusp. Valve area (Vmax): 2.2 cm^2. - Mitral valve: There was mild regurgitation. - Left atrium: The atrium was mildly dilated. - Pulmonary arteries: Systolic pressure was mildly increased. PA peak pressure: 46 mm Hg (S).  Repeat echo pending  Patient Profile     83 y.o. male with a hx of CAD s/p CABG who is being followed by cardiology for the evaluation of new atrial fibrillation post-operation for intertrochanteric fracture at the request of Dr. Tyrell Antonio.  Assessment & Plan    1. AFib: Rate controlled on higher dose of afib in general, though did have an episode with RVR yesterday. Has gone in and out of sinus rhythm. When in sinus, has 1st degree AV block with LBBB and frequent PACs/PVCs.  -CHADSVASC=8, but HAS-BLED=4. Given recent surgery and high risk, dose-adjusted apixaban was started. However, his Hgb  went from 8.7 to 7.5 since yesterday. No obvious source of bleeding. If he has documented significant bleeding, or if his Hgb continues to downtrend, would stop anticoagulation and look for source of bleeding.  -will defer to primary team given anemia, thrombocytopenia for heme workup. -If trend continues, will need to discuss risk/benefit of anticoagulation again. He is also thrombocytopenic (81 today), increasing his risk of bleeding. Overall a very difficult balance. A stroke would be catastrophic, but continued anemia/thrombocytopenia (and once home, risk of fall) are high risk as well. -rate controlled on carvedilol when in NSR, but still has elevated HR when he goes into afib. No recent pauses, though there was prior concern given bradycardia with standing. Does have 1st degree block and LBBB. Would not over-block. -PRN metoprolol IV for RVR episodes  2. Hypertension: very elevated today. One low  number yesterday but ?whether accurate given pressure immediately before and after was much higher. -amlodipine was decreased in anticipation of increased carvedilol dose. Will increase amlodipine back to 5 mg today. Keep carvedilol at current dosing for now. -he was on lisinopril at home, which is being held due to acute kidney injury in the setting of chronic kidney disease. He was on a low dose of lisinopril (5 mg). If blood pressure remains elevated, and depending on heart rates, would either increase carvedilol (though would be careful with this given comments above) or increase amlodipine to 10 mg. He has PRN hydralazine ordered until the medication adjustments take effect.  3. CAD: Asymptomatic even when he was markedly tachycardic.  No evaluation necessary at this time.  He should not receive both aspirin and Eliquis.  He is on atorvastatin.  4. CHF, likely acute diastolic heart failure given prior echo: Now net negative 5 L this admission and under his admission weight. Total down 5 kg from his peak. Creatinine worsening again today. Appears near euvolemic. Off O2 this AM when I saw him.  -Would hold diuresis (was getting 80 mg IV BID furosemide) for today and monitor his breathing/volume status/creatinine. If all stable to improved, would diurese only PRN. Does not appear that he was on home diuretics prior. -repeat echo pending  5. 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 -holding lisinopril  Time Spent Directly with Patient: I have spent a total of >35 minutes with the patient reviewing hospital notes, telemetry, EKGs, labs and examining the patient as well as establishing an assessment and plan that was discussed personally with the patient.  > 50% of time was spent in direct patient care.  Length of Stay:  LOS: 3 days   Buford Dresser, MD, PhD [facility]   07/20/2018, 7:57 AM For questions or updates, please  contact [place] Please consult [email or web address] for contact info under Cardiology/STEMI.

## 2018-07-20 NOTE — Progress Notes (Signed)
Attempted to call report. RN will call back. 

## 2018-07-21 ENCOUNTER — Encounter (HOSPITAL_COMMUNITY): Payer: Self-pay | Admitting: Orthopaedic Surgery

## 2018-07-21 ENCOUNTER — Inpatient Hospital Stay (HOSPITAL_COMMUNITY): Payer: Medicare Other

## 2018-07-21 DIAGNOSIS — I5030 Unspecified diastolic (congestive) heart failure: Secondary | ICD-10-CM

## 2018-07-21 LAB — BASIC METABOLIC PANEL
ANION GAP: 7 (ref 5–15)
BUN: 57 mg/dL — ABNORMAL HIGH (ref 8–23)
CHLORIDE: 106 mmol/L (ref 98–111)
CO2: 26 mmol/L (ref 22–32)
Calcium: 7.5 mg/dL — ABNORMAL LOW (ref 8.9–10.3)
Creatinine, Ser: 2.72 mg/dL — ABNORMAL HIGH (ref 0.61–1.24)
GFR, EST AFRICAN AMERICAN: 22 mL/min — AB (ref >60)
GFR, EST NON AFRICAN AMERICAN: 19 mL/min — AB (ref >60)
Glucose, Bld: 130 mg/dL — ABNORMAL HIGH (ref 70–99)
POTASSIUM: 3.7 mmol/L (ref 3.5–5.1)
SODIUM: 139 mmol/L (ref 135–145)

## 2018-07-21 LAB — GLUCOSE, CAPILLARY
GLUCOSE-CAPILLARY: 179 mg/dL — AB (ref 70–99)
GLUCOSE-CAPILLARY: 182 mg/dL — AB (ref 70–99)
GLUCOSE-CAPILLARY: 188 mg/dL — AB (ref 70–99)
Glucose-Capillary: 108 mg/dL — ABNORMAL HIGH (ref 70–99)
Glucose-Capillary: 210 mg/dL — ABNORMAL HIGH (ref 70–99)

## 2018-07-21 LAB — CBC
HEMATOCRIT: 23 % — AB (ref 39.0–52.0)
Hemoglobin: 7.5 g/dL — ABNORMAL LOW (ref 13.0–17.0)
MCH: 31.8 pg (ref 26.0–34.0)
MCHC: 32.6 g/dL (ref 30.0–36.0)
MCV: 97.5 fL (ref 78.0–100.0)
Platelets: 83 10*3/uL — ABNORMAL LOW (ref 150–400)
RBC: 2.36 MIL/uL — AB (ref 4.22–5.81)
RDW: 14 % (ref 11.5–15.5)
WBC: 6 10*3/uL (ref 4.0–10.5)

## 2018-07-21 LAB — ECHOCARDIOGRAM COMPLETE
HEIGHTINCHES: 72 in
Weight: 2737.23 oz

## 2018-07-21 LAB — LEGIONELLA PNEUMOPHILA SEROGP 1 UR AG: L. pneumophila Serogp 1 Ur Ag: NEGATIVE

## 2018-07-21 LAB — FERRITIN: Ferritin: 268 ng/mL (ref 24–336)

## 2018-07-21 LAB — IRON AND TIBC
Iron: 31 ug/dL — ABNORMAL LOW (ref 45–182)
SATURATION RATIOS: 18 % (ref 17.9–39.5)
TIBC: 171 ug/dL — AB (ref 250–450)
UIBC: 140 ug/dL

## 2018-07-21 LAB — VITAMIN D 25 HYDROXY (VIT D DEFICIENCY, FRACTURES): VIT D 25 HYDROXY: 22.5 ng/mL — AB (ref 30.0–100.0)

## 2018-07-21 MED ORDER — AMLODIPINE BESYLATE 10 MG PO TABS
10.0000 mg | ORAL_TABLET | Freq: Every day | ORAL | Status: DC
  Administered 2018-07-21 – 2018-07-24 (×4): 10 mg via ORAL
  Filled 2018-07-21 (×4): qty 1

## 2018-07-21 MED ORDER — VITAMIN D (ERGOCALCIFEROL) 1.25 MG (50000 UNIT) PO CAPS
50000.0000 [IU] | ORAL_CAPSULE | ORAL | Status: DC
  Administered 2018-07-21: 50000 [IU] via ORAL
  Filled 2018-07-21: qty 1

## 2018-07-21 MED ORDER — GLUCERNA SHAKE PO LIQD
237.0000 mL | Freq: Every day | ORAL | Status: DC
  Administered 2018-07-21 – 2018-07-23 (×3): 237 mL via ORAL

## 2018-07-21 MED ORDER — SODIUM CHLORIDE 0.9 % IV SOLN
1.0000 g | INTRAVENOUS | Status: AC
  Administered 2018-07-21 – 2018-07-22 (×2): 1 g via INTRAVENOUS
  Filled 2018-07-21 (×2): qty 1

## 2018-07-21 MED ORDER — SODIUM CHLORIDE 0.9 % IV SOLN
510.0000 mg | Freq: Once | INTRAVENOUS | Status: AC
  Administered 2018-07-21: 510 mg via INTRAVENOUS
  Filled 2018-07-21: qty 17

## 2018-07-21 NOTE — Progress Notes (Signed)
Occupational Therapy Treatment Patient Details Name: Chad Mcmillan MRN: [id number] DOB: February 06, 1924 Today's Date: 07/21/2018    History of present illness 84 y.o.-year-old male who sustained a right hip fracture. S/p IM nail on 8/30 of RLE. PMH including coronary artery disease, history of CVA, type 2 diabetes mellitus, hypertension, chronic kidney disease stage IV, chronic anemia, thrombocytopenia, and dementia.   OT comments  Pt progressing towards established OT goals. Pt donning left sock with Min A to facilitate LLE ROM towards R knee; requiring Max A for donning right sock. Pt performing simulated toilet transfer with Min A +2 and cues for RW management. Continue to recommend dc to SNF and will continue to follow acutely as admitted.    Follow Up Recommendations  SNF;Supervision/Assistance - 24 hour (Pending progress, possible good candidate for [facility])    Equipment Recommendations  Other (comment)(Defer to next venue)    Recommendations for Other Services PT consult    Precautions / Restrictions Precautions Precautions: Fall Restrictions Weight Bearing Restrictions: Yes RLE Weight Bearing: Weight bearing as tolerated       Mobility Bed Mobility Overal bed mobility: Needs Assistance Bed Mobility: Supine to Sit;Sit to Supine     Supine to sit: Min assist;+2 for physical assistance     General bed mobility comments: max directional verbal cues, pt able to advance bilat LEs towards EOB with max verbal cues, minAx2 for trunk elevation and to bring hips to EOB  Transfers Overall transfer level: Needs assistance Equipment used: Rolling walker (2 wheeled) Transfers: Sit to/from [facility] Sit to Stand: Min assist;+2 safety/equipment;From elevated surface Stand pivot transfers: Min assist;+2 physical assistance       General transfer comment: minA to power up, minA for walker management, minA to steady self during transition of hands    Balance  Overall balance assessment: Needs assistance Sitting-balance support: No upper extremity supported;Feet supported Sitting balance-Leahy Scale: Fair     Standing balance support: Bilateral upper extremity supported;During functional activity Standing balance-Leahy Scale: Poor Standing balance comment: Reliant on UE support                           ADL either performed or assessed with clinical judgement   ADL Overall ADL's : Needs assistance/impaired                     Lower Body Dressing: Sit to/from stand;Minimal assistance;Maximal assistance Lower Body Dressing Details (indicate cue type and reason): Max A for donning right sock. Pt requiring Mi nA to bring left ankle to right knee  for donning left sock.  Toilet Transfer: Minimal assistance;+2 for physical assistance;Stand-pivot;RW Toilet Transfer Details (indicate cue type and reason): Min A for power up into standing, balance during pivot, and safe descent         Functional mobility during ADLs: Minimal assistance;+2 for physical assistance;Rolling walker(stand pivot only) General ADL Comments: Pt limited by pain, decreased strength, and fear of falling.      Vision       Perception     Praxis      Cognition Arousal/Alertness: Awake/alert Behavior During Therapy: WFL for tasks assessed/performed Overall Cognitive Status: History of cognitive impairments - at baseline Area of Impairment: Safety/judgement;Awareness                       Following Commands: Follows one step commands with increased time Safety/Judgement: Decreased awareness of safety Awareness:  Emergent Problem Solving: Slow processing;Requires verbal cues General Comments: Baseline dementia. Pt with decreased processing and requires increased time and cues        Exercises     Shoulder Instructions       General Comments VSS throughout session. Pt reporting dizziness.     Pertinent Vitals/ Pain       Pain  Assessment: Faces Pain Score: 2  Faces Pain Scale: Hurts even more Pain Location: R LE Pain Descriptors / Indicators: Aching;Constant Pain Intervention(s): Monitored during session;Limited activity within patient's tolerance;Repositioned  Home Living                                          Prior Functioning/Environment              Frequency  Min 2X/week        Progress Toward Goals  OT Goals(current goals can now be found in the care plan section)  Progress towards OT goals: Progressing toward goals  Acute Rehab OT Goals Patient Stated Goal: get up OT Goal Formulation: With patient Time For Goal Achievement: 08/01/18 Potential to Achieve Goals: Good ADL Goals Pt Will Perform Upper Body Dressing: with set-up;with supervision;sitting Pt Will Perform Lower Body Dressing: with set-up;with supervision;sit to/from stand Pt Will Transfer to Toilet: with set-up;with supervision;ambulating;bedside commode Pt Will Perform Toileting - Clothing Manipulation and hygiene: with supervision;with set-up;sit to/from stand Additional ADL Goal #1: Pt will perform bed mobility with supervision in preparation for ADLs  Plan Discharge plan remains appropriate    Co-evaluation    PT/OT/SLP Co-Evaluation/Treatment: Yes Reason for Co-Treatment: Complexity of the patient's impairments (multi-system involvement);To address functional/ADL transfers   OT goals addressed during session: ADL's and self-care      AM-PAC PT "6 Clicks" Daily Activity     Outcome Measure   Help from another person eating meals?: None Help from another person taking care of personal grooming?: A Little Help from another person toileting, which includes using toliet, bedpan, or urinal?: A Lot Help from another person bathing (including washing, rinsing, drying)?: A Lot Help from another person to put on and taking off regular upper body clothing?: A Little Help from another person to put on  and taking off regular lower body clothing?: A Lot 6 Click Score: 16    End of Session Equipment Utilized During Treatment: Gait belt;Rolling walker  OT Visit Diagnosis: Unsteadiness on feet (R26.81);Other abnormalities of gait and mobility (R26.89);Muscle weakness (generalized) (M62.81);Pain;History of falling (Z91.81);Repeated falls (R29.6);Other symptoms and signs involving cognitive function Pain - Right/Left: Right Pain - part of body: Leg   Activity Tolerance Patient tolerated treatment well   Patient Left with call bell/phone within reach;with nursing/sitter in room;in chair   Nurse Communication Mobility status        Time: 2248-2500 OT Time Calculation (min): 26 min  Charges: OT General Charges $OT Visit: 1 Visit OT Treatments $Self Care/Home Management : 8-22 mins  [place], OTR/L Acute Rehab Pager: [phone] Office: [place] 07/21/2018, 1:05 PM

## 2018-07-21 NOTE — Clinical Social Work Placement (Signed)
   CLINICAL SOCIAL WORK PLACEMENT  NOTE  Date:  07/21/2018  Patient Details  Name: Chad Mcmillan MRN: [id number] Date of Birth: 02-10-1924  Clinical Social Work is seeking post-discharge placement for this patient at the [place] level of care (*CSW will initial, date and re-position this form in  chart as items are completed):  Yes   Patient/family provided with [place] Work Department's list of facilities offering this level of care within the geographic area requested by the patient (or if unable, by the patient's family).  Yes   Patient/family informed of their freedom to choose among providers that offer the needed level of care, that participate in Medicare, Medicaid or managed care program needed by the patient, have an available bed and are willing to accept the patient.  Yes   Patient/family informed of [facility]'s ownership interest in [facility] and [facility], as well as of the fact that they are under no obligation to receive care at these facilities.  PASRR submitted to EDS on 07/21/18     PASRR number received on       Existing PASRR number confirmed on 07/21/18     FL2 transmitted to all facilities in geographic area requested by pt/family on 07/21/18     FL2 transmitted to all facilities within larger geographic area on       Patient informed that his/her managed care company has contracts with or will negotiate with certain facilities, including the following:            Patient/family informed of bed offers received.  Patient chooses bed at       Physician recommends and patient chooses bed at      Patient to be transferred to   on  .  Patient to be transferred to facility by       Patient family notified on   of transfer.  Name of family member notified:        PHYSICIAN Please sign FL2     Additional Comment:    _______________________________________________ Candie Chroman, LCSW 07/21/2018, 4:37  PM

## 2018-07-21 NOTE — Progress Notes (Signed)
Physical Therapy Treatment Patient Details Name: Chad Mcmillan MRN: [id number] DOB: 11/30/1923 Today's Date: 07/21/2018    History of Present Illness 84 y.o.-year-old male who sustained a right hip fracture. S/p IM nail on 8/30 of RLE. PMH including coronary artery disease, history of CVA, type 2 diabetes mellitus, hypertension, chronic kidney disease stage IV, chronic anemia, thrombocytopenia, and dementia.    PT Comments    Pt with tolerating mobility well and was able to participate in LE supine there ex and R AAROM to hip and knee in supine as well. Pt ambulation limited by onset of dizziness. Pt not orthostatic, BP 178/89. Pt with minimal R hip pain and can tolerate WBing well however can not tolerate ambulation due to dizziness. Acute PT to cont to follow.    Follow Up Recommendations  SNF;Supervision/Assistance - 24 hour     Equipment Recommendations  Other (comment)    Recommendations for Other Services       Precautions / Restrictions Precautions Precautions: Fall Restrictions Weight Bearing Restrictions: Yes RLE Weight Bearing: Weight bearing as tolerated    Mobility  Bed Mobility Overal bed mobility: Needs Assistance Bed Mobility: Supine to Sit;Sit to Supine     Supine to sit: Min assist;+2 for physical assistance     General bed mobility comments: max directional verbal cues, pt able to advance bilat LEs towards EOB with max verbal cues, minAx2 for trunk elevation and to bring hips to EOB  Transfers Overall transfer level: Needs assistance Equipment used: Rolling walker (2 wheeled) Transfers: Sit to/from [facility] Sit to Stand: Min assist;+2 safety/equipment;From elevated surface Stand pivot transfers: Min assist;+2 physical assistance       General transfer comment: minA to power up, minA for walker management, minA to steady self during transition of hands  Ambulation/Gait             General Gait Details: pt took 5 steps towards  chair with max directional v/c's for sequencing, pt amb limited by onset of lightheadedness and dizziness   Stairs             Wheelchair Mobility    Modified Rankin (Stroke Patients Only)       Balance Overall balance assessment: Needs assistance Sitting-balance support: No upper extremity supported;Feet supported Sitting balance-Leahy Scale: Fair     Standing balance support: Bilateral upper extremity supported;During functional activity Standing balance-Leahy Scale: Poor Standing balance comment: Reliant on UE support                            Cognition Arousal/Alertness: Awake/alert Behavior During Therapy: WFL for tasks assessed/performed Overall Cognitive Status: History of cognitive impairments - at baseline Area of Impairment: Safety/judgement;Awareness                       Following Commands: Follows one step commands with increased time Safety/Judgement: Decreased awareness of safety Awareness: Emergent Problem Solving: Slow processing;Requires verbal cues General Comments: Baseline dementia. Pt with decreased processing and requires increased time and cues      Exercises      General Comments General comments (skin integrity, edema, etc.): VSS throughout session. Pt reporting dizziness.       Pertinent Vitals/Pain Pain Assessment: Faces Pain Score: 2  Faces Pain Scale: Hurts even more Pain Location: R LE Pain Descriptors / Indicators: Aching;Constant Pain Intervention(s): Monitored during session;Limited activity within patient's tolerance;Repositioned    Home Living  Prior Function            PT Goals (current goals can now be found in the care plan section) Acute Rehab PT Goals Patient Stated Goal: get up Progress towards PT goals: Progressing toward goals    Frequency    Min 3X/week      PT Plan Current plan remains appropriate    Co-evaluation PT/OT/SLP  Co-Evaluation/Treatment: Yes Reason for Co-Treatment: Complexity of the patient's impairments (multi-system involvement);To address functional/ADL transfers   OT goals addressed during session: ADL's and self-care      AM-PAC PT "6 Clicks" Daily Activity  Outcome Measure  Difficulty turning over in bed (including adjusting bedclothes, sheets and blankets)?: Unable Difficulty moving from lying on back to sitting on the side of the bed? : Unable Difficulty sitting down on and standing up from a chair with arms (e.g., wheelchair, bedside commode, etc,.)?: Unable Help needed moving to and from a bed to chair (including a wheelchair)?: A Little Help needed walking in hospital room?: A Little Help needed climbing 3-5 steps with a railing? : A Lot 6 Click Score: 11    End of Session Equipment Utilized During Treatment: Gait belt Activity Tolerance: Treatment limited secondary to medical complications (Comment) Patient left: in chair;with call bell/phone within reach Nurse Communication: Mobility status PT Visit Diagnosis: Unsteadiness on feet (R26.81);Other abnormalities of gait and mobility (R26.89);History of falling (Z91.81);Difficulty in walking, not elsewhere classified (R26.2);Pain Pain - Right/Left: Right Pain - part of body: Leg     Time: 5638-9373 PT Time Calculation (min) (ACUTE ONLY): 30 min  Charges:  $Therapeutic Activity: 8-22 mins                     Kittie Plater, PT, DPT Acute Rehabilitation Services Pager #: [phone] Office #: [phone]    [place] Lopes 07/21/2018, 1:52 PM

## 2018-07-21 NOTE — NC FL2 (Signed)
[place] LEVEL OF CARE SCREENING TOOL     IDENTIFICATION  Patient Name: Chad Mcmillan Birthdate: 10-31-1924 Sex: male Admission Date (Current Location): 07/17/2018  [facility] and [place] Number:  Herbalist and Address:  [facility] [place], [place] [address] [place], [place] [address]      Provider Number: [id number]  Attending Physician Name and Address:  Elmarie Shiley, MD  Relative Name and Phone Number:       Current Level of Care: Hospital Recommended Level of Care: [place] Prior Approval Number:    Date Approved/Denied:   PASRR Number: [id number]  Discharge Plan: SNF    Current Diagnoses: Patient Active Problem List   Diagnosis Date Noted  . Paroxysmal atrial fibrillation with RVR ([place])   . Acute diastolic heart failure ([place])   . S/P CABG (coronary artery bypass graft)   . Respiratory failure with hypoxia ([place]) 07/19/2018  . Closed fracture of acromial end of left clavicle 07/19/2018    Class: Acute  . Closed displaced fracture of greater trochanter of right femur ([place]) 07/17/2018  . History of thrombocytopenia 07/17/2018  . Displaced fracture of greater trochanter of right femur, initial encounter for closed fracture ([place]) 07/17/2018  . Pleural effusion, left 07/17/2018  . Abnormal finding on chest xray 07/17/2018  . CAP (community acquired pneumonia) 07/17/2018  . Hypocalcemia 02/12/2018  . Agitation   . Dementia   . Palliative care by specialist   . CKD (chronic kidney disease), stage IV ([place]) 01/14/2018  . HAP (hospital-acquired pneumonia) 01/12/2018  . AKI (acute kidney injury) ([place]) 01/12/2018  . Inguinal hernia of right side without obstruction or gangrene 01/12/2018  . Vertigo 01/07/2018  . LBBB (left bundle branch block) 01/07/2018  . Generalized weakness 01/07/2018  . Dehydration   . Foot pain, bilateral 10/07/2017  . Memory loss 04/07/2017  . Numbness 03/06/2016  . Dizzy 01/31/2016   . Medication side effect 03/08/2015  . Pancytopenia ([place]) 07/27/2012  . Goals of care, counseling/discussion 07/27/2012  . DM (diabetes mellitus) ([place]) 07/27/2012  . Anemia 07/27/2012  . Encounter for long-term (current) use of other medications 07/23/2011  . NASH (nonalcoholic steatohepatitis) 07/17/2010  . COUGH 02/06/2010  . PNEUMONIA, ORGANISM UNSPECIFIED 11/27/2009  . NEOPLASM, MALIGNANT, SKIN, FACE 06/01/2008  . ADENOCARCINOMA, PROSTATE 06/01/2008  . HYPERKALEMIA 06/01/2008  . History of stroke 06/01/2008  . Disorder of kidney and ureter 06/01/2008  . NEOPLASM, MALIGNANT, SKIN, FACE 06/01/2008  . Dyslipidemia 06/13/2007  . Essential hypertension 06/13/2007  . Coronary atherosclerosis 06/13/2007    Orientation RESPIRATION BLADDER Height & Weight     Self, Time, Situation, Place  Normal, Other (Comment)(No indication for bipap since 8/31: 14/8 at 30%.) Continent, Indwelling catheter Weight: 171 lb 1.2 oz (77.6 kg) Height:  6' (182.9 cm)  BEHAVIORAL SYMPTOMS/MOOD NEUROLOGICAL BOWEL NUTRITION STATUS  (None) (Dementia. History of stroke.) Continent Diet(DYS 2. Fluid restriction 1200 mL.)  AMBULATORY STATUS COMMUNICATION OF NEEDS Skin   Extensive Assist Verbally Bruising, Surgical wounds                       Personal Care Assistance Level of Assistance  Bathing, Feeding, Dressing Bathing Assistance: Limited assistance Feeding assistance: Limited assistance Dressing Assistance: Limited assistance     Functional Limitations Info  Sight, Hearing, Speech Sight Info: Adequate Hearing Info: Adequate Speech Info: Adequate    SPECIAL CARE FACTORS FREQUENCY  PT (By licensed PT), Blood pressure, OT (By licensed OT)  PT Frequency: 5 x week OT Frequency: 5 x week            Contractures Contractures Info: Not present    Additional Factors Info  Code Status, Allergies Code Status Info: Full code Allergies Info: NKDA           Current Medications  (07/21/2018):  This is the current hospital active medication list Current Facility-Administered Medications  Medication Dose Route Frequency Provider Last Rate Last Dose  . 0.9 %  sodium chloride infusion (Manually program via Guardrails IV Fluids)   Intravenous Once Kloefkorn, [place], MD      . 0.9 %  sodium chloride infusion  250 mL Intravenous PRN Kloefkorn, Mali, MD      . acetaminophen (TYLENOL) tablet 325-650 mg  325-650 mg Oral Q6H PRN Kloefkorn, Mali, MD   650 mg at 07/20/18 1962  . alum & mag hydroxide-simeth (MAALOX/MYLANTA) 200-200-20 MG/5ML suspension 30 mL  30 mL Oral Q4H PRN Kloefkorn, Mali, MD      . amLODipine (NORVASC) tablet 10 mg  10 mg Oral Daily Sueanne Margarita, MD   10 mg at 07/21/18 0945  . apixaban (ELIQUIS) tablet 2.5 mg  2.5 mg Oral BID Croitoru, Mihai, MD   2.5 mg at 07/21/18 0944  . atorvastatin (LIPITOR) tablet 80 mg  80 mg Oral q1800 Kloefkorn, [place]i, MD   80 mg at 07/20/18 1749  . carvedilol (COREG) tablet 12.5 mg  12.5 mg Oral BID WC Croitoru, Mihai, MD   12.5 mg at 07/21/18 0944  . ceFEPIme (MAXIPIME) 1 g in sodium chloride 0.9 % 100 mL IVPB  1 g Intravenous Q24H Regalado, Belkys A, MD      . docusate sodium (COLACE) capsule 100 mg  100 mg Oral BID Dellie Catholic, [place], MD   100 mg at 07/21/18 0944  . donepezil (ARICEPT) tablet 5 mg  5 mg Oral QHS Kloefkorn, Mali, MD   5 mg at 07/20/18 2136  . feeding supplement (GLUCERNA SHAKE) (GLUCERNA SHAKE) liquid 237 mL  237 mL Oral QHS Regalado, Belkys A, MD      . ferrous sulfate tablet 325 mg  325 mg Oral BID WC Regalado, Belkys A, MD   325 mg at 07/21/18 0944  . hydrALAZINE (APRESOLINE) injection 10 mg  10 mg Intravenous Q8H PRN Kloefkorn, [place]i, MD   10 mg at 07/20/18 0725  . HYDROcodone-acetaminophen (NORCO/VICODIN) 5-325 MG per tablet 1-2 tablet  1-2 tablet Oral Q4H PRN Kloefkorn, [place]i, MD   1 tablet at 07/20/18 1556  . insulin aspart (novoLOG) injection 0-9 Units  0-9 Units Subcutaneous Q4H Kloefkorn, Mali, MD   2 Units at  07/21/18 1233  . LORazepam (ATIVAN) tablet 0.5 mg  0.5 mg Oral Daily PRN Kloefkorn, Mali, MD      . magnesium citrate solution 1 Bottle  1 Bottle Oral Once PRN Kloefkorn, [place], MD      . MEDLINE mouth rinse  15 mL Mouth Rinse BID Regalado, Belkys A, MD   15 mL at 07/20/18 2137  . menthol-cetylpyridinium (CEPACOL) lozenge 3 mg  1 lozenge Oral PRN Regalado, Belkys A, MD       Or  . phenol (CHLORASEPTIC) mouth spray 1 spray  1 spray Mouth/Throat PRN Regalado, Belkys A, MD      . methocarbamol (ROBAXIN) tablet 500 mg  500 mg Oral Q6H PRN Kloefkorn, Mali, MD   500 mg at 07/18/18 0010   Or  . methocarbamol (ROBAXIN) 500 mg in dextrose 5 % 50 mL  IVPB  500 mg Intravenous Q6H PRN Kloefkorn, Mali, MD      . metoprolol tartrate (LOPRESSOR) injection 5 mg  5 mg Intravenous Q8H PRN Regalado, Belkys A, MD   5 mg at 07/19/18 1719  . morphine 2 MG/ML injection 0.5-1 mg  0.5-1 mg Intravenous Q2H PRN Kloefkorn, Mali, MD      . ondansetron (ZOFRAN) tablet 4 mg  4 mg Oral Q6H PRN Kloefkorn, Mali, MD       Or  . ondansetron (ZOFRAN) injection 4 mg  4 mg Intravenous Q6H PRN Kloefkorn, Mali, MD   4 mg at 07/20/18 1920  . polyethylene glycol (MIRALAX / GLYCOLAX) packet 17 g  17 g Oral Daily Regalado, Belkys A, MD   17 g at 07/21/18 0945  . senna-docusate (Senokot-S) tablet 1 tablet  1 tablet Oral QHS PRN Kloefkorn, Mali, MD      . sorbitol 70 % solution 30 mL  30 mL Oral Daily PRN Kloefkorn, Mali, MD      . vitamin B-12 (CYANOCOBALAMIN) tablet 1,000 mcg  1,000 mcg Oral Daily Kloefkorn, [place]i, MD   1,000 mcg at 07/21/18 0944  . Vitamin D (Ergocalciferol) (DRISDOL) capsule 50,000 Units  50,000 Units Oral Q7 days Regalado, Belkys A, MD         Discharge Medications: Please see discharge summary for a list of discharge medications.  Relevant Imaging Results:  Relevant Lab Results:   Additional Information SS#: [id number]  Candie Chroman, LCSW

## 2018-07-21 NOTE — Progress Notes (Signed)
PROGRESS NOTE    [name]  QQI:[id number] DOB: 10-22-1924 DOA: 07/17/2018 PCP: [name], MD    Brief Narrative by Dr Myna Hidalgo: Chad Mcmillan is a 84 y.o. male with medical history significant for coronary artery disease, history of CVA, type 2 diabetes mellitus, hypertension, chronic kidney disease stage IV, chronic anemia, thrombocytopenia, and dementia, now presenting to the emergency department with severe right hip pain after fall at home.  The patient reports that he has been experiencing mild dyspnea and occasional cough, but no chest pain, fevers, or chills.  He was walking with his cane last night when he tripped and fell onto his right side, denies hitting his head or losing consciousness.  He was experiencing pain in the low back, and particularly in the right hip.  He was unable to get up due to the severe pain in his right hip.  His son, with whom he lives, called EMS and the patient was treated with fentanyl prior to arrival in the ED.  ED Course: Upon arrival to the ED, patient is found to be afebrile, saturating well on room air, and with blood pressure 180/80.  EKG features a sinus rhythm with first-degree AV nodal block and LVH with repolarization abnormality.  Radiographs of the right hip are concerning for mildly displaced acute fracture of the right femoral greater trochanter.  There is no acute fracture on lumbar spine radiographs.  Chest x-ray is notable for persistent airspace densities primarily involving the left lower lobe where there is interval increase in a pleural effusion suggestive of infection or neoplasia.  Noncontrast head CT is negative for acute intracranial abnormality.  Chemistry panel is notable for a creatinine of 2.18, similar to priors.  CBC features a microcytic anemia with hemoglobin of 8.4, down from the 9-11 range several months ago.  Platelets are clumped on the CBC.  Type and screen was performed, fentanyl was administered, and orthopedic surgery was  consulted by the ED physician with recommendation to admit to medicine, keep the patient n.p.o., and MRI the right hip.''  Patient admitted with right greater trochanter fracture on 8-30. Hospital course complicated by hyperkalemia, worsening renal function, and pulmonary edema requiring BIPAP. Also he was receiving Blood transfusion when he develops worsening respiratory failure. He was started on IV lasix 80 mg IV BID, IV cefepime to treat PNA. His respiratory failure improved. He is now on RA.   He also develops new onset A fib. Cardiology was consulted. Carvedilol dose was increase. Patient was also  started on eliquis.  He has low Hb , post op. Plan is to give him IV iron.   Assessment & Plan:   Principal Problem:   Displaced fracture of greater trochanter of right femur, initial encounter for closed fracture [facility]) Active Problems:   Essential hypertension   History of stroke   DM (diabetes mellitus) ([place])   Anemia   AKI (acute kidney injury) ([place])   CKD (chronic kidney disease), stage IV (H[facility])   Dementia   Closed displaced fracture of greater trochanter of right femur (H[facility])   History of thrombocytopenia   Pleural effusion, left   Abnormal finding on chest xray   CAP (community acquired pneumonia)   Respiratory failure with hypoxia ([place])   Closed fracture of acromial end of left clavicle   Paroxysmal atrial fibrillation with RVR ([facility])   Acute diastolic heart failure ([facility])   S/P CABG (coronary artery bypass graft)   1-Acute hypoxic Respiratory Failure; Pulmonary edema vs less  likely  PNA Chest x ray with asymmetric edema, Vs PNA>  Became more hypoxic, tachypnea.  Place on 15 L oxygen--- then BIPAP.  CCM consulted. Appreciate assistance.  Now off BIPAP. Good urine out put.  Discontinue vancomycin. Continue with cefepime for 5 days. Day 4/5/  Treated with IV lasix.  Negative 6 L. Urine out put 2.2 L 9-02. Received 80 mg lasix 9-02 Weight 80 kg---84---77.  Hold lasix today.  Monitor urine out put. If weight increases, might need to resume lasix.   Acute fracture of right greater trochanter;  Mechanical fall at home.  -X ray; suggest mildly displaced fracture of right greater trochanter  -Underwent to surgery 8-30. Intramedullary implant.  -PT per ortho.   A fib, new onset.  Suspect related to acute illness.  Continue with carvedilol. Increases dose. .  IV metoprolol PRN.  Cardiology consulted.  Started on eliquis. Monitor hb   Hyperkalemia; in setting of renal failure;  Calcium gluconate given. Insulin, D 50, Bicarb Kayexalate times 3.  Resolved.   Anemia, thrombocytopenia;  Prior Hb ranges 9--11.  Has history of thrombocytopenia.  Continue with B 12 supplementation.  History of thrombocytopenia. But 3 month ago platelet were normal.  S/P transfusion 8-31. Didn't complete transfusion due to pulmonary edema Monitor for bleeding. Repeat hb in am.  Stool brown. Started  ferrous sulfate  Check iron and ferritin. iron low. Will give IV iron.   Left pleural effusion,  PNA;  - CXR with airspace opacities bilaterally and left-sided pleural effusion  -needs follow up chest x ray to document resolution.  -antibiotics change to cefepime and vancomycin.  MRSA PCR negative,  Vancomycin topped .  Day 4 of 5 antibiotics.   Acute on CKD stage  IV;  Cr baseline 1.9--2.0 Increase cr to. 2.7.  Monitor urine out put.  Holding lasix today  DM type II;  Hold prandin.  SSI.   HTN;  Continue with coreg, Norvasc.  [place] hydralazine.   History of CVA;  Continue with lipitor. Aspirin on hold for sx.   Dementia; continue with Aricept.   Bradycardia;  Had an episode of bradycardia while getting up with PT.  Monitor on telemetry.  Correct hyperkalemia.  Holder parameter for carvedilol.   Left Upper extremity deformity; chronic but he has been complaining of shoulder pain.  -X ray; Mildly impacted nondisplaced fracture of the distal aspect of the left  clavicle just medial to the acromioclavicular joint. Humerus x ray ; iOld ununited fracture of the mid humeral diaphysis with chronic posttraumatic deformity. Sling PRN   Hypokalemia; resolved.   DVT prophylaxis: SCD Code Status: full code.  Family Communication: Son updated at bedside.   Disposition Plan: SNF in 24 to 48 hours.   Consultants:   Ortho    Procedures:   Sx, hip repair    Antimicrobials:   Prophylaxis.    Subjective: He is feeling better, breathing better.  He denies chest pain   Objective: Vitals:   07/20/18 2314 07/21/18 0300 07/21/18 0339 07/21/18 0534  BP: (!) 141/54  (!) 155/64   Pulse: 68 64 69   Resp: 18 18 19    Temp: 98 F (36.7 C)  98.4 F (36.9 C)   TempSrc: Oral  Oral   SpO2: 96% 97% 97%   Weight:    77.6 kg  Height:        Intake/Output Summary (Last 24 hours) at 07/21/2018 0750 Last data filed at 07/21/2018 0339 Gross per 24 hour  Intake 1080  ml  Output 2215 ml  Net -1135 ml   Filed Weights   07/19/18 0435 07/20/18 0500 07/21/18 0534  Weight: 84.1 kg 79.6 kg 77.6 kg    Examination:  General exam: NAD Respiratory system: CTA Cardiovascular system: S 1, S 2 IRR Gastrointestinal system: BS present, soft, nt Central nervous system: Alert, follows command.  Extremities: Left LE with dressing.  Skin: No rashes, lesions or ulcers   Data Reviewed: I have personally reviewed following labs and imaging studies  CBC: Recent Labs  Lab 07/17/18 0318 07/17/18 0812 07/18/18 0427 07/19/18 0422 07/20/18 0330 07/21/18 0237  WBC 7.5  --  7.9 12.0* 7.7 6.0  NEUTROABS 5.8  --   --   --   --   --   HGB 8.4*  --  7.4* 8.7* 7.5* 7.5*  HCT 27.7*  --  23.6* 26.5* 23.0* 23.0*  MCV 102.6*  --  99.6 96.7 95.8 97.5  PLT PLATELET CLUMPS NOTED ON SMEAR, COUNT APPEARS DECREASED 74* 73* 93* 81* 83*   Basic Metabolic Panel: Recent Labs  Lab 07/18/18 2014 07/18/18 2239 07/19/18 0422 07/20/18 0330 07/21/18 0237  NA 137 138 141 141 139    K 5.3* 5.3* 4.6 3.4* 3.7  CL 111 110 113* 110 106  CO2 18* 20* 20* 24 26  GLUCOSE 163* 175* 150* 130* 130*  BUN 52* 55* 55* 57* 57*  CREATININE 2.49* 2.57* 2.70* 2.77* 2.72*  CALCIUM 8.1* 7.8* 8.1* 7.6* 7.5*   GFR: Estimated Creatinine Clearance: 19 mL/min (A) (by C-G formula based on SCr of 2.72 mg/dL (H)). Liver Function Tests: No results for input(s): AST, ALT, ALKPHOS, BILITOT, PROT, ALBUMIN in the last 168 hours. No results for input(s): LIPASE, AMYLASE in the last 168 hours. No results for input(s): AMMONIA in the last 168 hours. Coagulation Profile: Recent Labs  Lab 07/17/18 0318 07/18/18 2014  INR 1.13 1.20   Cardiac Enzymes: No results for input(s): CKTOTAL, CKMB, CKMBINDEX, TROPONINI in the last 168 hours. BNP (last 3 results) No results for input(s): PROBNP in the last 8760 hours. HbA1C: No results for input(s): HGBA1C in the last 72 hours. CBG: Recent Labs  Lab 07/20/18 0017 07/20/18 0804 07/20/18 1144 07/20/18 1738 07/20/18 2056  GLUCAP 138* 158* 179* 174* 129*   Lipid Profile: No results for input(s): CHOL, HDL, LDLCALC, TRIG, CHOLHDL, LDLDIRECT in the last 72 hours. Thyroid Function Tests: No results for input(s): TSH, T4TOTAL, FREET4, T3FREE, THYROIDAB in the last 72 hours. Anemia Panel: No results for input(s): VITAMINB12, FOLATE, FERRITIN, TIBC, IRON, RETICCTPCT in the last 72 hours. Sepsis Labs: Recent Labs  Lab 07/18/18 2014 07/18/18 2239  PROCALCITON 0.28  --   LATICACIDVEN 1.5 1.2    Recent Results (from the past 240 hour(s))  MRSA PCR Screening     Status: None   Collection Time: 07/18/18 12:00 AM  Result Value Ref Range Status   MRSA by PCR NEGATIVE NEGATIVE Final    Comment:        The GeneXpert MRSA Assay (FDA approved for NASAL specimens only), is one component of a comprehensive MRSA colonization surveillance program. It is not intended to diagnose MRSA infection nor to guide or monitor treatment for MRSA  infections. Performed at [facility], [place] [address] [place], [place] [address]   Culture, blood (x 2)     Status: None (Preliminary result)   Collection Time: 07/18/18  8:14 PM  Result Value Ref Range Status   Specimen Description BLOOD RIGHT ARM  Final   Special Requests   Final    BOTTLES DRAWN AEROBIC ONLY Blood Culture adequate volume   Culture   Final    NO GROWTH 2 DAYS Performed at [facility], [address], [place], [place] [address]    Report Status PENDING  Incomplete  Culture, blood (x 2)     Status: None (Preliminary result)   Collection Time: 07/18/18  8:18 PM  Result Value Ref Range Status   Specimen Description BLOOD LEFT ARM  Final   Special Requests   Final    BOTTLES DRAWN AEROBIC AND ANAEROBIC Blood Culture adequate volume   Culture   Final    NO GROWTH 2 DAYS Performed at [facility], [address], [place], [place] [address]    Report Status PENDING  Incomplete         Radiology Studies: No results found.      Scheduled Meds: . sodium chloride   Intravenous Once  . amLODipine  5 mg Oral Daily  . apixaban  2.5 mg Oral BID  . atorvastatin  80 mg Oral q1800  . carvedilol  12.5 mg Oral BID WC  . docusate sodium  100 mg Oral BID  . donepezil  5 mg Oral QHS  . ferrous sulfate  325 mg Oral BID WC  . insulin aspart  0-9 Units Subcutaneous Q4H  . mouth rinse  15 mL Mouth Rinse BID  . polyethylene glycol  17 g Oral Daily  . cyanocobalamin  1,000 mcg Oral Daily   Continuous Infusions: . sodium chloride    . ceFEPime (MAXIPIME) IV 1 g (07/20/18 1832)  . methocarbamol (ROBAXIN) IV       LOS: 4 days    Time spent: 35 minutes.     Chad Shiley, MD [facility] Pager [phone]  If 7PM-7AM, please contact night-coverage [email or web address] Password [email or web address] 07/21/2018, 7:50 AM

## 2018-07-21 NOTE — Progress Notes (Signed)
Patient Name: Chad Mcmillan Date of Encounter: 07/21/2018  Primary Cardiologist: No primary care provider on file.   Subjective   Denies any chest pain or shortness of breath.  Still feels occasional fluttering.  Currently in atrial fibrillation with controlled ventricular response in the 80s  Inpatient Medications    Scheduled Meds: . sodium chloride   Intravenous Once  . amLODipine  5 mg Oral Daily  . apixaban  2.5 mg Oral BID  . atorvastatin  80 mg Oral q1800  . carvedilol  12.5 mg Oral BID WC  . docusate sodium  100 mg Oral BID  . donepezil  5 mg Oral QHS  . ferrous sulfate  325 mg Oral BID WC  . insulin aspart  0-9 Units Subcutaneous Q4H  . mouth rinse  15 mL Mouth Rinse BID  . polyethylene glycol  17 g Oral Daily  . cyanocobalamin  1,000 mcg Oral Daily   Continuous Infusions: . sodium chloride    . ceFEPime (MAXIPIME) IV 1 g (07/20/18 1832)  . methocarbamol (ROBAXIN) IV     PRN Meds: sodium chloride, acetaminophen, alum & mag hydroxide-simeth, hydrALAZINE, HYDROcodone-acetaminophen, LORazepam, magnesium citrate, menthol-cetylpyridinium **OR** phenol, methocarbamol **OR** methocarbamol (ROBAXIN) IV, metoprolol tartrate, morphine injection, ondansetron **OR** ondansetron (ZOFRAN) IV, senna-docusate, sorbitol   Vital Signs    Vitals:   07/21/18 0300 07/21/18 0339 07/21/18 0534 07/21/18 0801  BP:  (!) 155/64  (!) 181/84  Pulse: 64 69  78  Resp: 18 19  18   Temp:  98.4 F (36.9 C)  98.5 F (36.9 C)  TempSrc:  Oral  Axillary  SpO2: 97% 97%    Weight:   77.6 kg   Height:        Intake/Output Summary (Last 24 hours) at 07/21/2018 0927 Last data filed at 07/21/2018 0339 Gross per 24 hour  Intake 360 ml  Output 1490 ml  Net -1130 ml   Filed Weights   07/19/18 0435 07/20/18 0500 07/21/18 0534  Weight: 84.1 kg 79.6 kg 77.6 kg    Telemetry    Atrial fibrillation with controlled ventricular response- Personally Reviewed  ECG    No new EKG - Personally  Reviewed  Physical Exam   GEN: No acute distress.   Neck: No JVD Cardiac:  Irregularly irregular, no murmurs, rubs, or gallops.  Respiratory: Clear to auscultation bilaterally. GI: Soft, nontender, non-distended  MS: No edema; No deformity. Neuro:  Nonfocal  Psych: Normal affect   Labs    Chemistry Recent Labs  Lab 07/19/18 0422 07/20/18 0330 07/21/18 0237  NA 141 141 139  K 4.6 3.4* 3.7  CL 113* 110 106  CO2 20* 24 26  GLUCOSE 150* 130* 130*  BUN 55* 57* 57*  CREATININE 2.70* 2.77* 2.72*  CALCIUM 8.1* 7.6* 7.5*  GFRNONAA 19* 18* 19*  GFRAA 22* 21* 22*  ANIONGAP 8 7 7      Hematology Recent Labs  Lab 07/19/18 0422 07/20/18 0330 07/21/18 0237  WBC 12.0* 7.7 6.0  RBC 2.74* 2.40* 2.36*  HGB 8.7* 7.5* 7.5*  HCT 26.5* 23.0* 23.0*  MCV 96.7 95.8 97.5  MCH 31.8 31.3 31.8  MCHC 32.8 32.6 32.6  RDW 14.1 14.0 14.0  PLT 93* 81* 83*    Cardiac EnzymesNo results for input(s): TROPONINI in the last 168 hours. No results for input(s): TROPIPOC in the last 168 hours.   BNPNo results for input(s): BNP, PROBNP in the last 168 hours.   DDimer No results for input(s): DDIMER  in the last 168 hours.   Radiology    No results found.  Cardiac Studies   Study Conclusions  - Left ventricle: The cavity size was normal. There was mild   concentric hypertrophy. Systolic function was at the lower limits   of normal. The estimated ejection fraction was in the range of   50% to 55%. Possible mild hypokinesis of the   basal-midanterolateral myocardium. There was fusion of early and   atrial contributions to ventricular filling. The study is not   technically sufficient to allow evaluation of LV diastolic   function. - Aortic valve: Mild focal calcification involving the noncoronary   cusp. Valve area (Vmax): 2.2 cm^2. - Mitral valve: There was mild regurgitation. - Left atrium: The atrium was mildly dilated. - Pulmonary arteries: Systolic pressure was mildly increased.  PA   peak pressure: 46 mm Hg (S).  Patient Profile     84 y.o. male with a hx of CAD s/p CABGwho is being followed by cardiology for the evaluation of newatrial fibrillation post-operation for intertrochanteric fractureat the request of Dr. Tyrell Antonio.  Assessment & Plan    1.  PAF -currently in A. fib at 89 bpm -continues to go in and out of A. fib -When in sinus, has 1st degree AV block with LBBB and frequent PACs/PVCs.  -CHADSVASC=8, but HAS-BLED=4. Given recent surgery and high risk, dose-adjusted apixaban was started. However, his Hgb went from 8.7 to 7.5 since yesterday. No obvious source of bleeding. If he has documented significant bleeding, or if his Hgb continues to downtrend, would stop anticoagulation and look for source of bleeding.  -will defer to primary team given anemia, thrombocytopenia for heme workup. -If trend continues, will need to discuss risk/benefit of anticoagulation again. He is also thrombocytopenic (81 today), increasing his risk of bleeding. Overall a very difficult balance. A stroke would be catastrophic, but continued anemia/thrombocytopenia (and once home, risk of fall) are high risk as well. -rate controlled on carvedilol when in NSR, but still has elevated HR when he goes into afib. No recent pauses, though there was prior concern given bradycardia with standing.  -PRN metoprolol IV for RVR episodes  2. Hypertension -BP remains elevated today at 155-181/64-38mHg.  -Increase amlodipine to 10 mg daily,  -carvedilol 12.5 mg twice daily -he was on lisinopril at home, which is being held due to acute kidney injury in the setting of chronic kidney disease.   3. CAD -Asymptomatic even when he was markedly tachycardic.  -No evaluation necessary at this time.  -He should not receive both aspirin and Eliquis.  -He is on atorvastatin.  4. CHF, likely acute diastolic heart failure given prior echo -He put out 2.2 L yesterday and is net -6.2 L.    -Weight is down 4 pounds from yesterday and 14 pounds from admission  -Lasix stopped yesterday due to bump in creatinine. -Creatinine minimally improved today from 2.77-2.72. -Baseline creatinine appears to be around 1.82-2.29 back in April/May -He appears euvolemic on exam today and therefore would not use any diuretics -repeat echo pending  5. 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 -holding lisinopril     For questions or updates, please contact C[place]choPlease consult [email or web address] for contact info under Cardiology/STEMI.      Signed, TFransico Him MD  07/21/2018, 9:27 AM

## 2018-07-21 NOTE — Clinical Social Work Note (Signed)
Patient is agreeable to SNF placement. He stated his wife has dementia so he asked that CSW call both of his sons as they have equal HCPOA. Spoke with Louie Casa. He is agreeable to SNF and stated [facility] is first preference because patient has been there before. Left voicemail for son, Chriss Czar. Patient is agreeable to [facility]. Sent referral and left message with admissions coordinator to notify.  Dayton Scrape, [place]

## 2018-07-21 NOTE — Progress Notes (Signed)
  Echocardiogram 2D Echocardiogram has been performed.  Waynetta Metheny G Etienne Mowers 07/21/2018, 11:07 AM

## 2018-07-21 NOTE — Progress Notes (Signed)
Nutrition Follow-up  DOCUMENTATION CODES:   Not applicable  INTERVENTION:   -Nepro Shake po q HS, each supplement provides 425 kcal and 19 grams protein -Downgrade diet to dysphagia 2 for ease of intake related to poor dentition  NUTRITION DIAGNOSIS:   Increased nutrient needs related to hip fracture, post-op healing as evidenced by estimated needs.  Ongoing  GOAL:   Patient will meet greater than or equal to 90% of their needs  Progressing  MONITOR:   PO intake, Supplement acceptance, Labs, Weight trends, Skin, I & O's  REASON FOR ASSESSMENT:   Consult Hip fracture protocol  ASSESSMENT:   84 yo Male with PMH of CAD, stroke and dementia; presented s/p mechanical fall at home.  8/30- s/p treatment of intertrochanteric fracture with intramedullary implant. CPT [phone]   Reviewed I/O's: -1.1 L x 24 hours and -6.2 L since admission  Spoke with pt at bedside, who is pleasant and in good spirits at visit. He reports improving appetite. PTA, pt reports he was consuming 3 "really good" meals per day (meals are provided by pt's children). Observed pt's breakfast tray, which revealed pt consumed one bite of sausage, a few bites of scrambled eggs, and 100% of grape juice. Pt reports he has been having trouble eating related to poor dentition. He is amenable to diet downgrade for ease of intake.   Pt shares his UBW of 180#. He shares he has progressively been losing weight over the past few years "due to old age". Reviewed wt hx; pt has experienced a 4.4% wt loss over the past 6 months.   Discussed with pt importance of good meal and supplement intake to promote healing. Pt amenable to supplement.   Labs reviewed: CBGS: 108-188 (inpatient orders for glycemic control are 0-9 units insulin aspart every 4 hours).  NUTRITION - FOCUSED PHYSICAL EXAM:    Most Recent Value  Orbital Region  Mild depletion  Upper Arm Region  Moderate depletion  Thoracic and Lumbar Region  No depletion   Buccal Region  Mild depletion  Temple Region  Moderate depletion  Clavicle Bone Region  Moderate depletion  Clavicle and Acromion Bone Region  Mild depletion  Scapular Bone Region  Mild depletion  Dorsal Hand  No depletion  Patellar Region  Mild depletion  Anterior Thigh Region  Mild depletion  Posterior Calf Region  Mild depletion  Edema (RD Assessment)  Mild  Hair  Reviewed  Eyes  Reviewed  Mouth  Reviewed  Skin  Reviewed  Nails  Reviewed       Diet Order:   Diet Order            DIET DYS 2 Room service appropriate? Yes; Fluid consistency: Thin; Fluid restriction: 1200 mL Fluid  Diet effective now              EDUCATION NEEDS:   Education needs have been addressed  Skin:  Skin Assessment: Skin Integrity Issues: Skin Integrity Issues:: Incisions Incisions: closed rt hip  Last BM:  07/19/18  Height:   Ht Readings from Last 1 Encounters:  07/17/18 6' (1.829 m)    Weight:   Wt Readings from Last 1 Encounters:  07/21/18 77.6 kg    Ideal Body Weight:  81 kg  BMI:  Body mass index is 23.2 kg/m.  Estimated Nutritional Needs:   Kcal:  1700-1900  Protein:  85-100 grams  Fluid:  1.7-1.9 L    Praveen Coia A. Jimmye Norman, RD, LDN, CDE Pager: [phone] After hours Pager: [phone]

## 2018-07-21 NOTE — Progress Notes (Signed)
Orthopedic Tech Progress Note Patient Details:  Chad Mcmillan [birth year] [id number]  Ortho Devices Type of Ortho Device: Shoulder immobilizer Ortho Device/Splint Interventions: Application   Post Interventions Patient Tolerated: Well Instructions Provided: Care of device   [place] Pink 07/21/2018, 7:28 PM

## 2018-07-21 NOTE — Progress Notes (Signed)
Pharmacy Antibiotic Note  Chad Mcmillan is a 84 y.o. male admitted on 07/17/2018 with sepsis / possible pneumonia.   Pharmacy has been consulted for Cefepime / Vancomycin  Dosing.  MRSA screen neg and vancomycin stopped, WBC 12>wnl, afebrile, Cx neg  Cr 2.7 Day 4 of ABX  Plan: Continue Cefepime 1 grams iv Q 24 hours for now will discuss LOT with primary team   Height: 6' (182.9 cm) Weight: 171 lb 1.2 oz (77.6 kg) IBW/kg (Calculated) : 77.6  Temp (24hrs), Avg:98.2 F (36.8 C), Min:97.4 F (36.3 C), Max:98.8 F (37.1 C)  Recent Labs  Lab 07/17/18 0318 07/18/18 0427  07/18/18 2014 07/18/18 2239 07/19/18 0422 07/20/18 0330 07/21/18 0237  WBC 7.5 7.9  --   --   --  12.0* 7.7 6.0  CREATININE 2.18* 2.18*   < > 2.49* 2.57* 2.70* 2.77* 2.72*  LATICACIDVEN  --   --   --  1.5 1.2  --   --   --    < > = values in this interval not displayed.    Estimated Creatinine Clearance: 19 mL/min (A) (by C-G formula based on SCr of 2.72 mg/dL (H)).    No Known Allergies   Bonnita Nasuti Pharm.D. CPP, BCPS Clinical Pharmacist [phone] 07/21/2018 9:50 AM

## 2018-07-22 ENCOUNTER — Ambulatory Visit: Payer: Medicare Other | Admitting: Endocrinology

## 2018-07-22 LAB — GLUCOSE, CAPILLARY
GLUCOSE-CAPILLARY: 205 mg/dL — AB (ref 70–99)
GLUCOSE-CAPILLARY: 295 mg/dL — AB (ref 70–99)
Glucose-Capillary: 122 mg/dL — ABNORMAL HIGH (ref 70–99)
Glucose-Capillary: 107 mg/dL — ABNORMAL HIGH (ref 70–99)
Glucose-Capillary: 260 mg/dL — ABNORMAL HIGH (ref 70–99)

## 2018-07-22 LAB — CBC
HEMATOCRIT: 21.5 % — AB (ref 39.0–52.0)
Hemoglobin: 7.1 g/dL — ABNORMAL LOW (ref 13.0–17.0)
MCH: 32 pg (ref 26.0–34.0)
MCHC: 33 g/dL (ref 30.0–36.0)
MCV: 96.8 fL (ref 78.0–100.0)
Platelets: 93 10*3/uL — ABNORMAL LOW (ref 150–400)
RBC: 2.22 MIL/uL — ABNORMAL LOW (ref 4.22–5.81)
RDW: 13.7 % (ref 11.5–15.5)
WBC: 6.6 10*3/uL (ref 4.0–10.5)

## 2018-07-22 LAB — BASIC METABOLIC PANEL
Anion gap: 9 (ref 5–15)
BUN: 71 mg/dL — AB (ref 8–23)
CHLORIDE: 104 mmol/L (ref 98–111)
CO2: 24 mmol/L (ref 22–32)
CREATININE: 2.99 mg/dL — AB (ref 0.61–1.24)
Calcium: 7.5 mg/dL — ABNORMAL LOW (ref 8.9–10.3)
GFR calc Af Amer: 19 mL/min — ABNORMAL LOW (ref >60)
GFR calc non Af Amer: 17 mL/min — ABNORMAL LOW (ref >60)
GLUCOSE: 149 mg/dL — AB (ref 70–99)
POTASSIUM: 4 mmol/L (ref 3.5–5.1)
SODIUM: 137 mmol/L (ref 135–145)

## 2018-07-22 LAB — PREPARE RBC (CROSSMATCH)

## 2018-07-22 LAB — HEMOGLOBIN AND HEMATOCRIT, BLOOD
HCT: 25.4 % — ABNORMAL LOW (ref 39.0–52.0)
HEMOGLOBIN: 8.2 g/dL — AB (ref 13.0–17.0)

## 2018-07-22 MED ORDER — POLYVINYL ALCOHOL 1.4 % OP SOLN
1.0000 [drp] | OPHTHALMIC | Status: DC | PRN
  Administered 2018-07-24: 1 [drp] via OPHTHALMIC
  Filled 2018-07-22: qty 15

## 2018-07-22 MED ORDER — CARVEDILOL 25 MG PO TABS
25.0000 mg | ORAL_TABLET | Freq: Two times a day (BID) | ORAL | Status: DC
  Administered 2018-07-22 – 2018-07-24 (×4): 25 mg via ORAL
  Filled 2018-07-22 (×4): qty 1

## 2018-07-22 MED ORDER — INSULIN ASPART 100 UNIT/ML ~~LOC~~ SOLN
0.0000 [IU] | Freq: Three times a day (TID) | SUBCUTANEOUS | Status: DC
  Administered 2018-07-22 – 2018-07-23 (×2): 3 [IU] via SUBCUTANEOUS

## 2018-07-22 MED ORDER — SODIUM CHLORIDE 0.9% IV SOLUTION: Freq: Once | INTRAVENOUS | Status: DC

## 2018-07-22 NOTE — Progress Notes (Signed)
Progress Note  Patient Name: Chad Mcmillan Date of Encounter: 07/22/2018  Primary Cardiologist: No primary care provider on file.   Subjective   Denies any chest pain or shortness of breath.  Feels nauseated this am.  Tele shows NSR  Inpatient Medications    Scheduled Meds: . sodium chloride   Intravenous Once  . amLODipine  10 mg Oral Daily  . apixaban  2.5 mg Oral BID  . atorvastatin  80 mg Oral q1800  . carvedilol  12.5 mg Oral BID WC  . docusate sodium  100 mg Oral BID  . donepezil  5 mg Oral QHS  . feeding supplement (GLUCERNA SHAKE)  237 mL Oral QHS  . ferrous sulfate  325 mg Oral BID WC  . insulin aspart  0-9 Units Subcutaneous Q4H  . mouth rinse  15 mL Mouth Rinse BID  . polyethylene glycol  17 g Oral Daily  . cyanocobalamin  1,000 mcg Oral Daily  . Vitamin D (Ergocalciferol)  50,000 Units Oral Q7 days   Continuous Infusions: . sodium chloride    . ceFEPime (MAXIPIME) IV 1 g (07/21/18 1655)  . methocarbamol (ROBAXIN) IV     PRN Meds: sodium chloride, acetaminophen, alum & mag hydroxide-simeth, hydrALAZINE, HYDROcodone-acetaminophen, LORazepam, magnesium citrate, menthol-cetylpyridinium **OR** phenol, methocarbamol **OR** methocarbamol (ROBAXIN) IV, metoprolol tartrate, morphine injection, ondansetron **OR** ondansetron (ZOFRAN) IV, senna-docusate, sorbitol   Vital Signs    Vitals:   07/21/18 2305 07/22/18 0415 07/22/18 0500 07/22/18 0725  BP: (!) 142/45 (!) 132/59  (!) 162/58  Pulse: 72 62  65  Resp: (!) 36 (!) 22  (!) 24  Temp: 98 F (36.7 C) 97.7 F (36.5 C)  (!) 97.3 F (36.3 C)  TempSrc: Oral Oral  Oral  SpO2: 100% 98%  100%  Weight:   76.6 kg   Height:        Intake/Output Summary (Last 24 hours) at 07/22/2018 0950 Last data filed at 07/22/2018 0759 Gross per 24 hour  Intake 580 ml  Output 1200 ml  Net -620 ml   Filed Weights   07/20/18 0500 07/21/18 0534 07/22/18 0500  Weight: 79.6 kg 77.6 kg 76.6 kg    Telemetry    NSR - Personally  Reviewed  ECG    No new EKG to review - Personally Reviewed  Physical Exam   GEN: No acute distress.   Neck: No JVD Cardiac: RRR, no murmurs, rubs, or gallops.  Respiratory: Clear to auscultation bilaterally. GI: Soft, nontender, non-distended  MS: No edema; No deformity. Neuro:  Nonfocal  Psych: Normal affect   Labs    Chemistry Recent Labs  Lab 07/20/18 0330 07/21/18 0237 07/22/18 0310  NA 141 139 137  K 3.4* 3.7 4.0  CL 110 106 104  CO2 24 26 24   GLUCOSE 130* 130* 149*  BUN 57* 57* 71*  CREATININE 2.77* 2.72* 2.99*  CALCIUM 7.6* 7.5* 7.5*  GFRNONAA 18* 19* 17*  GFRAA 21* 22* 19*  ANIONGAP 7 7 9      Hematology Recent Labs  Lab 07/20/18 0330 07/21/18 0237 07/22/18 0310  WBC 7.7 6.0 6.6  RBC 2.40* 2.36* 2.22*  HGB 7.5* 7.5* 7.1*  HCT 23.0* 23.0* 21.5*  MCV 95.8 97.5 96.8  MCH 31.3 31.8 32.0  MCHC 32.6 32.6 33.0  RDW 14.0 14.0 13.7  PLT 81* 83* 93*    Cardiac EnzymesNo results for input(s): TROPONINI in the last 168 hours. No results for input(s): TROPIPOC in the last 168 hours.  BNPNo results for input(s): BNP, PROBNP in the last 168 hours.   DDimer No results for input(s): DDIMER in the last 168 hours.   Radiology    No results found.  Cardiac Studies   2D echo Study Conclusions  - Left ventricle: The cavity size was normal. There was mild focal   basal hypertrophy of the septum. Systolic function was normal.   The estimated ejection fraction was in the range of 50% to 55%.   Wall motion was normal; there were no regional wall motion   abnormalities. Doppler parameters are consistent with abnormal   left ventricular relaxation (grade 1 diastolic dysfunction). - Aortic valve: There was no significant regurgitation. - Mitral valve: There was trivial regurgitation. - Left atrium: The atrium was mildly to moderately dilated. - Right ventricle: Systolic function was normal. - Right atrium: Central venous pressure (est): 3 mm Hg. - Atrial  septum: No defect or patent foramen ovale was identified. - Tricuspid valve: There was trivial regurgitation. - Pulmonic valve: There was no significant regurgitation. - Pericardium, extracardiac: There was a left pleural effusion.  Impressions:  - Normal LV systolic function with grade 1 diasolic dysfunction. No   significant valvular disease. IVC collapses on respiration,   normal CVP.  Patient Profile     85 y.o. male with a hx of CAD s/p CABGwho is beingfollowed by cardiologyfor the evaluation of newatrial fibrillationpost-operation for intertrochanteric fractureat the request of Dr. Tyrell Antonio.  Assessment & Plan    1.  PAF -currently in A. fib at 89 bpm -continues to go in and out of A. fib -When in sinus, has 1st degree AV block with LBBB and frequent PACs/PVCs.  -CHADSVASC=8, but HAS-BLED=4. Given recent surgery and high risk, dose-adjusted apixaban was started. However, his Hgb trend continues downward (8.7>7.5>7.5>7.1). No obvious source of bleeding. -If trend continues, will need to discuss risk/benefit of anticoagulation again. He is also thrombocytopenic (81 today), increasing his risk of bleeding. Overall a very difficult balance. A stroke would be catastrophic, but continued anemia/thrombocytopenia (and once home, risk of fall) are high risk as well. -rate controlled on carvedilol when in NSR, but still has elevated HR when he goes into afib. No recent pauses, though there was prior concern given bradycardia with standing.  -given continued drop in Hbg now to 7.1 will stop Eliqius -will defer to primary team given anemia, thrombocytopenia for heme workup.  2. Hypertension -BP remains elevated today at 132/59 - 162/4mHg -continue amlodipine to 10 mg daily -increase carvedilol to 244mBID and follow for bradycardia -he was on lisinopril at home, which is being held due to acute kidney injury in the setting of chronic kidney disease.   3. CAD -Asymptomatic even  when he was markedly tachycardic.  -No evaluation necessary at this time.  -ASA on hold due to low Hbg and DOAC -He is onatorvastatin.  4. CHF, likely acute diastolic heart failure given prior echo -He put out 1.1 L yesterday and is net -6.58 L.   -Weight is down 3 pounds from yesterday and 17 pounds from admission  -Lasix stopped due to bump in creatinine. -Creatinine continues to trend upwards and now 2.99 -Baseline creatinine appears to be around 1.82-2.29 back in April/May -He appears euvolemic on exam today and therefore would not use any diuretics -repeat echo with low normal LVF with EF 50-55%  5. Acute on CKD4: -creatinine continues to climb despite holding diuretics -holding lisinopril  Give multiple medical problems with worsening anemia and worsening  renal function, consider palliative care consult.          For questions or updates, please contact [place] Please consult [email or web address] for contact info under Cardiology/STEMI.      Signed, Fransico Him, MD  07/22/2018, 9:50 AM

## 2018-07-22 NOTE — Progress Notes (Addendum)
PROGRESS NOTE   Chad Mcmillan  HYW:[id number]    DOB: 03/19/1924    DOA: 07/17/2018  PCP: [name], MD   I have briefly reviewed patients previous medical records in [facility].  Brief Narrative:  84 year old male with PMH of CAD, CVA, type I DM, HTN, HLD, stage IV chronic kidney disease, chronic anemia, thrombocytopenia and dementia presented to ED on 07/17/2018 following a fall at home and right-sided hip pain with associated mild dyspnea and occasional cough.  He was walking with his cane, tripped and fell on his right side, denied hitting his head or LOC.  He was admitted for right greater trochanteric fracture and underwent fixation by orthopedics 8/30.  Hospital course complicated by acute on chronic kidney disease, hyperkalemia, acute on chronic diastolic CHF following blood transfusion, pneumonia and transiently required BiPAP, new onset A. fib, progressive anemia.  Cardiology consulting.   Assessment & Plan:   Principal Problem:   Displaced fracture of greater trochanter of right femur, initial encounter for closed fracture [facility]) Active Problems:   Essential hypertension   History of stroke   DM (diabetes mellitus) ([place])   Anemia   AKI (acute kidney injury) ([place])   CKD (chronic kidney disease), stage IV (HCC)   Dementia   Closed displaced fracture of greater trochanter of right femur (HCC)   History of thrombocytopenia   Pleural effusion, left   Abnormal finding on chest xray   CAP (community acquired pneumonia)   Respiratory failure with hypoxia ([place])   Closed fracture of acromial end of left clavicle   Paroxysmal atrial fibrillation with RVR ([place])   Acute diastolic heart failure (HCC)   S/P CABG (coronary artery bypass graft)   Acute hypoxic respiratory failure: Suspected more from decompensated CHF rather than pneumonia.  He was briefly on high flow oxygen and then BiPAP.  CCM consulted and signed off.  Treating underlying cause.  Hypoxia resolved.  Right  greater trochanteric fracture: Sustained after mechanical fall at home.  Orthopedics consulted and patient underwent surgery/intramedullary implant on 8/30.  Management per orthopedics.  Paroxysmal atrial fibrillation: Cardiology consulting. CHADSVASC=8.  Currently in sinus rhythm and occasional PAF.  Remains on carvedilol 25 mg twice daily.  Briefly on Eliquis but discontinued by cardiology 9/4 due to progressively worsening anemia despite no reported major bleeding and thrombocytopenia.  Essential hypertension: Controlled.  Continue amlodipine 10 mg daily and carvedilol 25 mg twice daily.  Lisinopril on hold due to acute on chronic kidney disease.  CAD: Asymptomatic of chest pain.  Continue carvedilol and atorvastatin.  Aspirin on hold due to progressive anemia.  Acute on chronic diastolic CHF: Cardiology following.  TTE: LVEF 50-55%.  Briefly on IV Lasix but discontinued due to worsening renal insufficiency.  -6.4 L since admission.  Clinically appears compensated.  Acute on stage IV chronic kidney disease: Likely precipitated by aggressive diuresis.  Lasix discontinued.  Lisinopril on hold.  Baseline creatinine 1.8-2.2 in April/May.  Creatinine has gone up from 2.7-2.9.  Encourage slightly increased oral fluids today and follow BMP in a.m.  Has indwelling Foley catheter so no concern for urinary retention.  If worsens, consider nephrology consultation and renal ultrasound.  Hyperkalemia: Status post insulin, dextrose, bicarbonate, calcium gluconate and Kayexalate.  Resolved.  Anemia: Baseline hemoglobin in April and May ranged between 9-10.  Presented with hemoglobin of 8.4.  Partial 1 unit transfusion done early on in admission but discontinued due to acute pulmonary edema.  No overt bleeding.  Anemia panel reviewed: Iron  31, ferritin 268.  Status post dose of IV iron.  Suspect worsening anemia to be multifactorial: Anemia of chronic kidney disease, anemia of chronic disease, some acute blood loss  from fracture and surgery.  Hemoglobin dropped to 7.1 on 9/4, symptomatic of dizziness.  Carefully transfuse 1 unit PRBC, patient agreeable.  Thrombocytopenia, chronic and intermittent: Appears to be at baseline.  No intervention.  Left pleural effusion/community-acquired pneumonia: Will complete 5 days course of cefepime 9/4.  MRSA PCR negative. Needs follow-up chest x-ray in 4 weeks to ensure resolution of pneumonia findings.  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  History of CVA: Continue statins.  Aspirin on hold due to progressively worsening anemia.  Newly started Eliquis also stopped by cardiology 9/4 due to same reason.  Dementia: Mental status likely at baseline.  Continue Aricept.  Bradycardia: Had an episode of bradycardia earlier in admission while getting up with PT.  Resolved.  Left upper extremity deformity, chronic: X-ray showed mildly impacted nondisplaced fracture of the distal aspect of the left clavicle just medial to the acromioclavicular joint and humerus x-ray showed old ununited fracture of the mid humeral diaphysis with chronic posttraumatic deformity.  Pain management and PRN sling.  Outpatient follow-up with orthopedics.  Acute urinary retention: As reported by nursing.  Continue Foley catheter for additional day then attempt to discontinue and voiding trial.  Hypokalemia: Replaced.  Adult failure to thrive: Multifactorial due to 84 years old, frail physical status and multiple severe significant comorbidities.  As per cardiology input, will request palliative care input for goals of care.   DVT prophylaxis: SCDs Code Status: Full Family Communication: None at bedside Disposition: To be determined.  Not medically stable for discharge.   Consultants:  Orthopedics Cardiology CCM.  Procedures:  Treatment of right intertrochanteric hip fracture with IM nail 8/30 BiPAP-now off Foley catheter  Antimicrobials:   Azithromycin x1 dose Ancef x1 dose Cefepime 8/31-9/3 Vancomycin x1 dose.   Subjective: Patient states that he feels "fine".  He does report moderate right hip pain (operated hip) especially on movement.  He also reports dizziness when attempting to get up first time.  No chest pain, dyspnea.  Denies overt bleeding, black tarry stools.  No palpitations.  As per RN, no other acute issues noted.  ROS: As above otherwise negative.  Objective:  Vitals:   07/22/18 0415 07/22/18 0500 07/22/18 0725 07/22/18 1039  BP: (!) 132/59  (!) 162/58 92/72  Pulse: 62  65   Resp: (!) 22  (!) 24   Temp: 97.7 F (36.5 C)  (!) 97.3 F (36.3 C) 97.9 F (36.6 C)  TempSrc: Oral  Oral Oral  SpO2: 98%  100% 95%  Weight:  76.6 kg    Height:        Examination:  General exam: Pleasant elderly male, moderately built, frail and chronically ill looking lying comfortably supine in bed. Respiratory system: Slightly diminished breath sounds in the bases but otherwise clear to auscultation without wheezing, rhonchi or crackles. Respiratory effort normal. Cardiovascular system: S1 & S2 heard, RRR. No JVD, murmurs, rubs, gallops or clicks. No pedal edema.  Telemetry personally reviewed: Sinus rhythm/?  PAF, BBB morphology and occasional PVCs. Gastrointestinal system: Abdomen is nondistended, soft and nontender. No organomegaly or masses felt. Normal bowel sounds heard. Central nervous system: Alert and oriented x2. No focal neurological deficits. Extremities: Symmetric 5 x 5 power.  Right lower extremity movements restricted due to postop right hip pain. Skin: No rashes,  lesions or ulcers Psychiatry: Judgement and insight appear somewhat impaired. Mood & affect appropriate.     Data Reviewed: I have personally reviewed following labs and imaging studies  CBC: Recent Labs  Lab 07/17/18 0318  07/18/18 0427 07/19/18 0422 07/20/18 0330 07/21/18 0237 07/22/18 0310  WBC 7.5  --  7.9 12.0* 7.7 6.0 6.6    NEUTROABS 5.8  --   --   --   --   --   --   HGB 8.4*  --  7.4* 8.7* 7.5* 7.5* 7.1*  HCT 27.7*  --  23.6* 26.5* 23.0* 23.0* 21.5*  MCV 102.6*  --  99.6 96.7 95.8 97.5 96.8  PLT PLATELET CLUMPS NOTED ON SMEAR, COUNT APPEARS DECREASED   < > 73* 93* 81* 83* 93*   < > = values in this interval not displayed.   Basic Metabolic Panel: Recent Labs  Lab 07/18/18 2239 07/19/18 0422 07/20/18 0330 07/21/18 0237 07/22/18 0310  NA 138 141 141 139 137  K 5.3* 4.6 3.4* 3.7 4.0  CL 110 113* 110 106 104  CO2 20* 20* 24 26 24   GLUCOSE 175* 150* 130* 130* 149*  BUN 55* 55* 57* 57* 71*  CREATININE 2.57* 2.70* 2.77* 2.72* 2.99*  CALCIUM 7.8* 8.1* 7.6* 7.5* 7.5*   Coagulation Profile: Recent Labs  Lab 07/17/18 0318 07/18/18 2014  INR 1.13 1.20   CBG: Recent Labs  Lab 07/21/18 1708 07/21/18 2106 07/21/18 2358 07/22/18 0356 07/22/18 0814  GLUCAP 179* 210* 182* 122* 107*    Recent Results (from the past 240 hour(s))  MRSA PCR Screening     Status: None   Collection Time: 07/18/18 12:00 AM  Result Value Ref Range Status   MRSA by PCR NEGATIVE NEGATIVE Final    Comment:        The GeneXpert MRSA Assay (FDA approved for NASAL specimens only), is one component of a comprehensive MRSA colonization surveillance program. It is not intended to diagnose MRSA infection nor to guide or monitor treatment for MRSA infections. Performed at [facility], [place] [address] [place], [place] [address]   Culture, blood (x 2)     Status: None (Preliminary result)   Collection Time: 07/18/18  8:14 PM  Result Value Ref Range Status   Specimen Description BLOOD RIGHT ARM  Final   Special Requests   Final    BOTTLES DRAWN AEROBIC ONLY Blood Culture adequate volume   Culture   Final    NO GROWTH 4 DAYS Performed at [facility], [place] [address] [place], [place] [address]    Report Status PENDING  Incomplete  Culture, blood (x 2)     Status: None (Preliminary result)   Collection  Time: 07/18/18  8:18 PM  Result Value Ref Range Status   Specimen Description BLOOD LEFT ARM  Final   Special Requests   Final    BOTTLES DRAWN AEROBIC AND ANAEROBIC Blood Culture adequate volume   Culture   Final    NO GROWTH 4 DAYS Performed at [facility], [place] [address], [place], [place] [address]    Report Status PENDING  Incomplete         Radiology Studies: No results found.      Scheduled Meds: . sodium chloride   Intravenous Once  . amLODipine  10 mg Oral Daily  . atorvastatin  80 mg Oral q1800  . carvedilol  25 mg Oral BID WC  . docusate sodium  100 mg  Oral BID  . donepezil  5 mg Oral QHS  . feeding supplement (GLUCERNA SHAKE)  237 mL Oral QHS  . ferrous sulfate  325 mg Oral BID WC  . insulin aspart  0-9 Units Subcutaneous Q4H  . mouth rinse  15 mL Mouth Rinse BID  . polyethylene glycol  17 g Oral Daily  . cyanocobalamin  1,000 mcg Oral Daily  . Vitamin D (Ergocalciferol)  50,000 Units Oral Q7 days   Continuous Infusions: . sodium chloride    . ceFEPime (MAXIPIME) IV 1 g (07/21/18 1655)  . methocarbamol (ROBAXIN) IV       LOS: 5 days     Vernell Leep, MD, FACP, [facility]. [facility] Pager [phone]  If 7PM-7AM, please contact night-coverage [email or web address] Password [email or web address] 07/22/2018, 12:07 PM

## 2018-07-23 LAB — CBC
HEMATOCRIT: 25.5 % — AB (ref 39.0–52.0)
Hemoglobin: 8.3 g/dL — ABNORMAL LOW (ref 13.0–17.0)
MCH: 31.1 pg (ref 26.0–34.0)
MCHC: 32.5 g/dL (ref 30.0–36.0)
MCV: 95.5 fL (ref 78.0–100.0)
PLATELETS: 90 10*3/uL — AB (ref 150–400)
RBC: 2.67 MIL/uL — AB (ref 4.22–5.81)
RDW: 15.2 % (ref 11.5–15.5)
WBC: 5.5 10*3/uL (ref 4.0–10.5)

## 2018-07-23 LAB — BASIC METABOLIC PANEL
Anion gap: 9 (ref 5–15)
BUN: 78 mg/dL — ABNORMAL HIGH (ref 8–23)
CHLORIDE: 107 mmol/L (ref 98–111)
CO2: 21 mmol/L — AB (ref 22–32)
CREATININE: 2.74 mg/dL — AB (ref 0.61–1.24)
Calcium: 7.6 mg/dL — ABNORMAL LOW (ref 8.9–10.3)
GFR calc non Af Amer: 19 mL/min — ABNORMAL LOW (ref >60)
GFR, EST AFRICAN AMERICAN: 22 mL/min — AB (ref >60)
Glucose, Bld: 236 mg/dL — ABNORMAL HIGH (ref 70–99)
Potassium: 4.2 mmol/L (ref 3.5–5.1)
Sodium: 137 mmol/L (ref 135–145)

## 2018-07-23 LAB — GLUCOSE, CAPILLARY
GLUCOSE-CAPILLARY: 231 mg/dL — AB (ref 70–99)
Glucose-Capillary: 178 mg/dL — ABNORMAL HIGH (ref 70–99)
Glucose-Capillary: 238 mg/dL — ABNORMAL HIGH (ref 70–99)
Glucose-Capillary: 289 mg/dL — ABNORMAL HIGH (ref 70–99)

## 2018-07-23 LAB — CULTURE, BLOOD (ROUTINE X 2)
CULTURE: NO GROWTH
Culture: NO GROWTH
SPECIAL REQUESTS: ADEQUATE
Special Requests: ADEQUATE

## 2018-07-23 LAB — TYPE AND SCREEN
ABO/RH(D): A POS
ANTIBODY SCREEN: NEGATIVE
UNIT DIVISION: 0

## 2018-07-23 LAB — BPAM RBC
Blood Product Expiration Date: 201909112359
ISSUE DATE / TIME: 201909041637
Unit Type and Rh: 6200

## 2018-07-23 MED ORDER — INSULIN ASPART 100 UNIT/ML ~~LOC~~ SOLN
0.0000 [IU] | Freq: Every day | SUBCUTANEOUS | Status: DC
  Administered 2018-07-23: 3 [IU] via SUBCUTANEOUS

## 2018-07-23 MED ORDER — INSULIN ASPART 100 UNIT/ML ~~LOC~~ SOLN
0.0000 [IU] | Freq: Three times a day (TID) | SUBCUTANEOUS | Status: DC
  Administered 2018-07-23: 3 [IU] via SUBCUTANEOUS
  Administered 2018-07-24: 2 [IU] via SUBCUTANEOUS
  Administered 2018-07-24: 3 [IU] via SUBCUTANEOUS

## 2018-07-23 NOTE — Progress Notes (Signed)
Progress Note  Patient Name: Chad Mcmillan Date of Encounter: 07/23/2018  Primary Cardiologist: No primary care provider on file.   Subjective   Denies any CP or SOB.  No Palpitations  Inpatient Medications    Scheduled Meds: . sodium chloride   Intravenous Once  . amLODipine  10 mg Oral Daily  . atorvastatin  80 mg Oral q1800  . carvedilol  25 mg Oral BID WC  . docusate sodium  100 mg Oral BID  . donepezil  5 mg Oral QHS  . feeding supplement (GLUCERNA SHAKE)  237 mL Oral QHS  . ferrous sulfate  325 mg Oral BID WC  . insulin aspart  0-9 Units Subcutaneous TID WC  . mouth rinse  15 mL Mouth Rinse BID  . polyethylene glycol  17 g Oral Daily  . cyanocobalamin  1,000 mcg Oral Daily  . Vitamin D (Ergocalciferol)  50,000 Units Oral Q7 days   Continuous Infusions: . sodium chloride    . methocarbamol (ROBAXIN) IV     PRN Meds: sodium chloride, acetaminophen, alum & mag hydroxide-simeth, hydrALAZINE, HYDROcodone-acetaminophen, LORazepam, magnesium citrate, menthol-cetylpyridinium **OR** phenol, methocarbamol **OR** methocarbamol (ROBAXIN) IV, metoprolol tartrate, morphine injection, ondansetron **OR** ondansetron (ZOFRAN) IV, polyvinyl alcohol, senna-docusate, sorbitol   Vital Signs    Vitals:   07/23/18 0318 07/23/18 0807 07/23/18 1223 07/23/18 1231  BP:  (!) 148/52 (!) 125/56 119/83  Pulse:  65 73 61  Resp:  (!) 24  (!) 27  Temp:  97.6 F (36.4 C)  (!) 97.2 F (36.2 C)  TempSrc:  Oral  Oral  SpO2:  99% 99% 100%  Weight: 77.5 kg     Height:        Intake/Output Summary (Last 24 hours) at 07/23/2018 1426 Last data filed at 07/23/2018 0600 Gross per 24 hour  Intake 959.67 ml  Output 775 ml  Net 184.67 ml   Filed Weights   07/21/18 0534 07/22/18 0500 07/23/18 0318  Weight: 77.6 kg 76.6 kg 77.5 kg    Telemetry    NSR - Personally Reviewed  ECG    No new EKG to review- Personally Reviewed  Physical Exam   GEN: No acute distress.   Neck: No JVD Cardiac: RRR,  no murmurs, rubs, or gallops.  Respiratory: Clear to auscultation bilaterally. GI: Soft, nontender, non-distended  MS: No edema; No deformity. Neuro:  Nonfocal  Psych: Normal affect   Labs    Chemistry Recent Labs  Lab 07/21/18 0237 07/22/18 0310 07/23/18 0324  NA 139 137 137  K 3.7 4.0 4.2  CL 106 104 107  CO2 26 24 21*  GLUCOSE 130* 149* 236*  BUN 57* 71* 78*  CREATININE 2.72* 2.99* 2.74*  CALCIUM 7.5* 7.5* 7.6*  GFRNONAA 19* 17* 19*  GFRAA 22* 19* 22*  ANIONGAP 7 9 9      Hematology Recent Labs  Lab 07/21/18 0237 07/22/18 0310 07/22/18 2243 07/23/18 0324  WBC 6.0 6.6  --  5.5  RBC 2.36* 2.22*  --  2.67*  HGB 7.5* 7.1* 8.2* 8.3*  HCT 23.0* 21.5* 25.4* 25.5*  MCV 97.5 96.8  --  95.5  MCH 31.8 32.0  --  31.1  MCHC 32.6 33.0  --  32.5  RDW 14.0 13.7  --  15.2  PLT 83* 93*  --  90*    Cardiac EnzymesNo results for input(s): TROPONINI in the last 168 hours. No results for input(s): TROPIPOC in the last 168 hours.   BNPNo results for input(s):  BNP, PROBNP in the last 168 hours.   DDimer No results for input(s): DDIMER in the last 168 hours.   Radiology    No results found.  Cardiac Studies   2D echo Study Conclusions  - Left ventricle: The cavity size was normal. There was mild focal basal hypertrophy of the septum. Systolic function was normal. The estimated ejection fraction was in the range of 50% to 55%. Wall motion was normal; there were no regional wall motion abnormalities. Doppler parameters are consistent with abnormal left ventricular relaxation (grade 1 diastolic dysfunction). - Aortic valve: There was no significant regurgitation. - Mitral valve: There was trivial regurgitation. - Left atrium: The atrium was mildly to moderately dilated. - Right ventricle: Systolic function was normal. - Right atrium: Central venous pressure (est): 3 mm Hg. - Atrial septum: No defect or patent foramen ovale was identified. - Tricuspid valve:  There was trivial regurgitation. - Pulmonic valve: There was no significant regurgitation. - Pericardium, extracardiac: There was a left pleural effusion.  Impressions:  - Normal LV systolic function with grade 1 diasolic dysfunction. No significant valvular disease. IVC collapses on respiration, normal CVP.   Patient Profile     84 y.o. male  with a hx of CAD s/p CABGwho is beingfollowed by cardiologyfor the evaluation of newatrial fibrillationpost-operation for intertrochanteric fractureat the request of Dr. Tyrell Antonio.  Assessment & Plan    1. PAF -When in sinus, has 1st degree AV block with LBBB and frequent PACs/PVCs.  -CHADSVASC=8, but HAS-BLED=4. Given recent surgery and high risk, dose-adjusted apixaban was started. However, his Hgb trend continues downward (8.7>7.5>7.5>7.1). No obvious source of bleeding. -given continued drop in Hbg now to 7.1 will Eliquis stopped yesterday -will defer to primary team given anemia, thrombocytopenia for heme workup. -Continue carvedilol 25 mg twice daily for suppression of A. fib  2. Hypertension -BP controled at 119/58mHg -continue amlodipine to 10 mg daily, carvedilol  243mBID  -he was on lisinopril at home, which is being held due to acute kidney injury in the setting of chronic kidney disease.   3. CAD -Asymptomatic even when he was markedly tachycardic.  -No evaluation necessary at this time.  -ASA on hold due to low Hbg and DOAC -He is onatorvastatin.  4. CHF, likely acute diastolic heart failure given prior echo -He put out 1 L yesterday and is net -6.24 L. -Weight is up 2 pounds from yesterday and 15pounds from admission  -Lasix stopped due to bump in creatinine. -Creatinine slightly improved to 2.74 today ( 2.99 yesterday)  -Baseline creatinine appears to be around 1.82-2.29back in April/May -He appears euvolemic on exam today and therefore would not use any diuretics -repeat echo with low normal  LVF with EF 50-55%  5. Acute on CKD4: -creatinine slowly improving with holding of diuretics and ACE I -holding lisinopril   [facility] will sign off.   Medication Recommendations: Amlodipine 10 mg daily, carvedilol 25 mg twice daily Other recommendations (labs, testing, etc): None Follow up as an outpatient:  Followup in 1-2 weeks with Dr. [facility] [place] [facility]  For questions or updates, please contact [place] consult [email or web address] for contact info under Cardiology/STEMI.      Signed, TrFransico HimMD  07/23/2018, 2:26 PM

## 2018-07-23 NOTE — Progress Notes (Signed)
PROGRESS NOTE   Chad Mcmillan  SMO:[id number]    DOB: [birth year]    DOA: 07/17/2018  PCP: [name], MD   I have briefly reviewed patients previous medical records in [facility].  Brief Narrative:  84 year old male with PMH of CAD, CVA, type I DM, HTN, HLD, stage IV chronic kidney disease, chronic anemia, thrombocytopenia and dementia presented to ED on 07/17/2018 following a fall at home and right-sided hip pain with associated mild dyspnea and occasional cough.  He was walking with his cane, tripped and fell on his right side, denied hitting his head or LOC.  He was admitted for right greater trochanteric fracture and underwent fixation by orthopedics 8/30.  Hospital course complicated by acute on chronic kidney disease, hyperkalemia, acute on chronic diastolic CHF following blood transfusion, pneumonia and transiently required BiPAP, new onset A. fib, progressive anemia.  Cardiology consulting.   Assessment & Plan:   Principal Problem:   Displaced fracture of greater trochanter of right femur, initial encounter for closed fracture [facility]) Active Problems:   Essential hypertension   History of stroke   DM (diabetes mellitus) ([place])   Anemia   AKI (acute kidney injury) ([place])   CKD (chronic kidney disease), stage IV (HCC)   Dementia   Closed displaced fracture of greater trochanter of right femur (HCC)   History of thrombocytopenia   Pleural effusion, left   Abnormal finding on chest xray   CAP (community acquired pneumonia)   Respiratory failure with hypoxia ([place])   Closed fracture of acromial end of left clavicle   Paroxysmal atrial fibrillation with RVR ([place])   Acute diastolic heart failure (HCC)   S/P CABG (coronary artery bypass graft)   Acute hypoxic respiratory failure: Suspected more from decompensated CHF rather than pneumonia.  He was briefly on high flow oxygen and then BiPAP.  CCM consulted and signed off.  Treating underlying cause.  Hypoxia resolved.  Right  greater trochanteric fracture: Sustained after mechanical fall at home.  Orthopedics consulted and patient underwent surgery/intramedullary implant on 8/30.  Management per orthopedics.  Paroxysmal atrial fibrillation: Cardiology consulting. CHADSVASC=8.  Currently in sinus rhythm and occasional PAF.  Remains on carvedilol 25 mg twice daily.  Briefly on Eliquis but discontinued by cardiology 9/4 due to progressively worsening anemia despite no reported major bleeding and thrombocytopenia.  I discussed in detail with Dr. Radford Pax on 9/5.  It is possible that patient had chronic anemia related to chronic kidney disease and his hemoglobin may have been in the mid 8 PTA which subsequently dropped to the 7 range, some of which may have been due to his fracture and perioperative blood loss.  No overt bleeding noted.  Patient is at high risk for bleeding complications while on Eliquis due to his advanced age, renal insufficiency and fall risk.  Plan to discuss with family to determine anticoagulation decision going forward.  Essential hypertension: Controlled.  Continue amlodipine 10 mg daily and carvedilol 25 mg twice daily.  Lisinopril on hold due to acute on chronic kidney disease.  CAD: Asymptomatic of chest pain.  Continue carvedilol and atorvastatin.  Aspirin on hold due to progressive anemia.  Acute on chronic diastolic CHF: Cardiology following.  TTE: LVEF 50-55%.  Briefly on IV Lasix but discontinued due to worsening renal insufficiency.  -6.2 L since admission.  Clinically appears compensated.  Remains stable.  Acute on stage IV chronic kidney disease: Likely precipitated by aggressive diuresis.  Lasix discontinued.  Lisinopril on hold.  Baseline creatinine  1.8-2.2 in April/May.  Creatinine has gone up from 2.7-2.9.  Creatinine has dropped to 2.7.  Continue to trend daily BMP.  Hyperkalemia: Status post insulin, dextrose, bicarbonate, calcium gluconate and Kayexalate.  Resolved.  Anemia: Baseline  hemoglobin in April and May ranged between 9-10.  Presented with hemoglobin of 8.4.  Partial 1 unit transfusion done early on in admission but discontinued due to acute pulmonary edema.  No overt bleeding.  Anemia panel reviewed: Iron 31, ferritin 268.  Status post dose of IV iron.  Suspect worsening anemia to be multifactorial: Anemia of chronic kidney disease, anemia of chronic disease, some acute blood loss from fracture and surgery.  Hemoglobin dropped to 7.1 on 9/4, symptomatic of dizziness.  Status post 1 unit PRBC 9/4 and hemoglobin is improved to 8.3.  Follow in a.m.  Thrombocytopenia, chronic and intermittent: Appears to be at baseline.  No intervention.  Left pleural effusion/community-acquired pneumonia: Completed 5 days course of cefepime 9/4.  MRSA PCR negative. Needs follow-up chest x-ray in 4 weeks to ensure resolution of pneumonia findings.  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  May need to consider low-dose Lantus.  History of CVA: Continue statins.  Aspirin on hold due to progressively worsening anemia.  Newly started Eliquis also stopped by cardiology 9/4 due to same reason.  See anticoagulation discussion above.  Dementia: Mental status likely at baseline.  Continue Aricept.  Bradycardia: Had an episode of bradycardia earlier in admission while getting up with PT.  Resolved.  Left upper extremity deformity, chronic: X-ray showed mildly impacted nondisplaced fracture of the distal aspect of the left clavicle just medial to the acromioclavicular joint and humerus x-ray showed old ununited fracture of the mid humeral diaphysis with chronic posttraumatic deformity.  Pain management and PRN sling.  Outpatient follow-up with orthopedics.  Acute urinary retention: As reported by nursing.  Discontinue Foley catheter 9/5 and monitor for post removal voiding.  Hypokalemia: Replaced.  Adult failure to thrive:  Multifactorial due to advanced age, frail physical status and multiple severe significant comorbidities.  Cardiology had recommended palliative care consultation for goals.  Patient however seems to be gradually improving and may consider discharge to SNF and outpatient palliative care consultation can be done there.   DVT prophylaxis: SCDs Code Status: Full Family Communication: None at bedside Disposition: To be determined.  Not medically stable for discharge.   Consultants:  Orthopedics Cardiology CCM.  Procedures:  Treatment of right intertrochanteric hip fracture with IM nail 8/30 BiPAP-now off Foley catheter-DC 9/5  Antimicrobials:  Azithromycin x1 dose Ancef x1 dose Cefepime 8/31-9/3 Vancomycin x1 dose.   Subjective: "I am fine".  Reports feeling stronger and dizziness improved but still has some.  No chest pain or dyspnea.  Discussed with RN regarding removal of Foley catheter.  ROS: As above otherwise negative.  Objective:  Vitals:   07/23/18 0807 07/23/18 1223 07/23/18 1231 07/23/18 1608  BP: (!) 148/52 (!) 125/56 119/83 (!) 98/50  Pulse: 65 73 61 73  Resp: (!) 24  (!) 27 15  Temp: 97.6 F (36.4 C)  (!) 97.2 F (36.2 C) 97.9 F (36.6 C)  TempSrc: Oral  Oral Oral  SpO2: 99% 99% 100% 98%  Weight:      Height:        Examination:  General exam: Pleasant elderly male, moderately built, frail and chronically ill sitting up comfortably in chair. Respiratory system: Clear to auscultation. Respiratory effort normal.  Cardiovascular system: S1 & S2 heard, RRR. No JVD, murmurs, rubs, gallops or clicks. No pedal edema.  Telemetry personally reviewed: BBB morphology, SB in the 50s occasionally.  Predominantly sinus rhythm in the 60s. Gastrointestinal system: Abdomen is nondistended, soft and nontender. No organomegaly or masses felt. Normal bowel sounds heard.  Stable. Central nervous system: Alert and oriented x2. No focal neurological deficits. Extremities:  Symmetric 5 x 5 power.  Right lower extremity movements restricted due to postop right hip pain. Skin: No rashes, lesions or ulcers Psychiatry: Judgement and insight appear somewhat impaired. Mood & affect appropriate.     Data Reviewed: I have personally reviewed following labs and imaging studies  CBC: Recent Labs  Lab 07/17/18 0318  07/19/18 0422 07/20/18 0330 07/21/18 0237 07/22/18 0310 07/22/18 2243 07/23/18 0324  WBC 7.5   < > 12.0* 7.7 6.0 6.6  --  5.5  NEUTROABS 5.8  --   --   --   --   --   --   --   HGB 8.4*   < > 8.7* 7.5* 7.5* 7.1* 8.2* 8.3*  HCT 27.7*   < > 26.5* 23.0* 23.0* 21.5* 25.4* 25.5*  MCV 102.6*   < > 96.7 95.8 97.5 96.8  --  95.5  PLT PLATELET CLUMPS NOTED ON SMEAR, COUNT APPEARS DECREASED   < > 93* 81* 83* 93*  --  90*   < > = values in this interval not displayed.   Basic Metabolic Panel: Recent Labs  Lab 07/19/18 0422 07/20/18 0330 07/21/18 0237 07/22/18 0310 07/23/18 0324  NA 141 141 139 137 137  K 4.6 3.4* 3.7 4.0 4.2  CL 113* 110 106 104 107  CO2 20* 24 26 24  21*  GLUCOSE 150* 130* 130* 149* 236*  BUN 55* 57* 57* 71* 78*  CREATININE 2.70* 2.77* 2.72* 2.99* 2.74*  CALCIUM 8.1* 7.6* 7.5* 7.5* 7.6*   Coagulation Profile: Recent Labs  Lab 07/17/18 0318 07/18/18 2014  INR 1.13 1.20   CBG: Recent Labs  Lab 07/22/18 1204 07/22/18 1731 07/22/18 2157 07/23/18 0810 07/23/18 1232  GLUCAP 205* 260* 295* 178* 238*    Recent Results (from the past 240 hour(s))  MRSA PCR Screening     Status: None   Collection Time: 07/18/18 12:00 AM  Result Value Ref Range Status   MRSA by PCR NEGATIVE NEGATIVE Final    Comment:        The GeneXpert MRSA Assay (FDA approved for NASAL specimens only), is one component of a comprehensive MRSA colonization surveillance program. It is not intended to diagnose MRSA infection nor to guide or monitor treatment for MRSA infections. Performed at [facility], [place] [address] [place], [place]  [address]   Culture, blood (x 2)     Status: None   Collection Time: 07/18/18  8:14 PM  Result Value Ref Range Status   Specimen Description BLOOD RIGHT ARM  Final   Special Requests   Final    BOTTLES DRAWN AEROBIC ONLY Blood Culture adequate volume   Culture   Final    NO GROWTH 5 DAYS Performed at [facility], [address] [place], [place] [address]    Report Status 07/23/2018 FINAL  Final  Culture, blood (x 2)     Status: None   Collection Time: 07/18/18  8:18 PM  Result Value Ref Range Status   Specimen Description BLOOD LEFT ARM  Final   Special Requests   Final  BOTTLES DRAWN AEROBIC AND ANAEROBIC Blood Culture adequate volume   Culture   Final    NO GROWTH 5 DAYS Performed at [facility], [place] [address], [place], [place] [address]    Report Status 07/23/2018 FINAL  Final         Radiology Studies: No results found.      Scheduled Meds: . sodium chloride   Intravenous Once  . amLODipine  10 mg Oral Daily  . atorvastatin  80 mg Oral q1800  . carvedilol  25 mg Oral BID WC  . docusate sodium  100 mg Oral BID  . donepezil  5 mg Oral QHS  . feeding supplement (GLUCERNA SHAKE)  237 mL Oral QHS  . ferrous sulfate  325 mg Oral BID WC  . insulin aspart  0-9 Units Subcutaneous TID WC  . mouth rinse  15 mL Mouth Rinse BID  . polyethylene glycol  17 g Oral Daily  . cyanocobalamin  1,000 mcg Oral Daily  . Vitamin D (Ergocalciferol)  50,000 Units Oral Q7 days   Continuous Infusions: . sodium chloride    . methocarbamol (ROBAXIN) IV       LOS: 6 days     Vernell Leep, MD, FACP, [facility]. [facility] Pager [phone]  If 7PM-7AM, please contact night-coverage [email or web address] Password [email or web address] 07/23/2018, 4:10 PM

## 2018-07-23 NOTE — Clinical Social Work Note (Signed)
Per MD patient may discharge tomorrow or over the weekend. [facility] admissions coordinator and patient's son, Louie Casa aware. He called his brother, [name] to go to facility to complete admissions coordinator. Per admissions coordinator, he just arrived.  Dayton Scrape, [place]

## 2018-07-23 NOTE — Plan of Care (Signed)
  Problem: Education: Goal: Knowledge of General Education information will improve Description Including pain rating scale, medication(s)/side effects and non-pharmacologic comfort measures Outcome: Progressing   Problem: Clinical Measurements: Goal: Ability to maintain clinical measurements within normal limits will improve Outcome: Progressing Goal: Will remain free from infection Outcome: Progressing Goal: Diagnostic test results will improve Outcome: Progressing Goal: Respiratory complications will improve Outcome: Progressing Goal: Cardiovascular complication will be avoided Outcome: Progressing   Problem: Activity: Goal: Risk for activity intolerance will decrease Outcome: Progressing   Problem: Nutrition: Goal: Adequate nutrition will be maintained Outcome: Progressing   Problem: Elimination: Goal: Will not experience complications related to urinary retention Outcome: Progressing   Problem: Pain Managment: Goal: General experience of comfort will improve Outcome: Progressing   Problem: Skin Integrity: Goal: Risk for impaired skin integrity will decrease Outcome: Progressing   Problem: Activity: Goal: Ability to ambulate and perform ADLs will improve Outcome: Progressing   Problem: Health Behavior/Discharge Planning: Goal: Ability to manage health-related needs will improve Outcome: Not Progressing   Problem: Elimination: Goal: Will not experience complications related to bowel motility Outcome: Not Progressing

## 2018-07-23 NOTE — Progress Notes (Signed)
Physical Therapy Treatment Patient Details Name: Chad Mcmillan MRN: [id number] DOB: 10/05/1924 Today's Date: 07/23/2018    History of Present Illness 84 y.o.-year-old male who sustained a right hip fracture. S/p IM nail on 8/30 of RLE. PMH including coronary artery disease, history of CVA, type 2 diabetes mellitus, hypertension, chronic kidney disease stage IV, chronic anemia, thrombocytopenia, and dementia.    PT Comments    Pt pleasant on arrival, asking for help (sliding out of recliner). Pt very anxious with standing, feeling as though falling forward and standing on toes. Pt w/ decreased proprioception as was on heels with strong posterior push. Once pt achieved vertical standing posture, pt stated claimed "Hallelujah I am a man again, I am not a chimpanzee." Pt overcome w/ happy emotion at increased mobility, pt really wants to return home to wife. Pt benefits from encouragement and reassurance that is safe. Pt encouraged to complete HEP 2-3x a day to assist w/ building strength back up in legs.     Follow Up Recommendations  SNF;Supervision/Assistance - 24 hour     Equipment Recommendations  Other (comment)(TBD )    Recommendations for Other Services       Precautions / Restrictions Precautions Precautions: Fall Precaution Comments: LUE sling for comfort Required Braces or Orthoses: Sling Restrictions LUE Weight Bearing: Non weight bearing RLE Weight Bearing: Weight bearing as tolerated    Mobility  Bed Mobility               General bed mobility comments: in chair on arrival   Transfers Overall transfer level: Needs assistance   Transfers: Sit to/from Stand Sit to Stand: Mod assist;+2 physical assistance         General transfer comment: mod a to achieve knee extension. Pt w/ strong posterior push, decreased proproception feeling like falling forward and on toes when actually on heels. Physical assist and max verbal and tactile cues to stand up tall, tuck hips  under.  3 sit/stand trials   Ambulation/Gait Ambulation/Gait assistance: Min assist;+2 safety/equipment;+2 physical assistance Gait Distance (Feet): 8 Feet Assistive device: 2 person hand held assist;Rolling walker (2 wheeled) Gait Pattern/deviations: Step-through pattern;Decreased stride length;Narrow base of support;Trunk flexed Gait velocity: decreased  Gait velocity interpretation: <1.31 ft/sec, indicative of household ambulator General Gait Details: pt denies dizziness today w/ gait. Pt w/ 2 hha at first then progressed to RW as confidence grew. verbal cues for posture.    Stairs             Wheelchair Mobility    Modified Rankin (Stroke Patients Only)       Balance Overall balance assessment: Needs assistance Sitting-balance support: Feet supported;No upper extremity supported Sitting balance-Leahy Scale: Fair Sitting balance - Comments: pt initially found sliding off reclining chair. Once physical assist to correct posture, able to maintain sitting posture w/ feet supported  Postural control: Posterior lean Standing balance support: Bilateral upper extremity supported;During functional activity Standing balance-Leahy Scale: Poor Standing balance comment: reliant on UE support                             Cognition Arousal/Alertness: Awake/alert Behavior During Therapy: WFL for tasks assessed/performed Overall Cognitive Status: History of cognitive impairments - at baseline Area of Impairment: Safety/judgement;Awareness;Problem solving                       Following Commands: Follows one step commands inconsistently;Follows one step commands with increased  time Safety/Judgement: Decreased awareness of safety;Decreased awareness of deficits Awareness: Emergent Problem Solving: Slow processing;Requires verbal cues;Requires tactile cues General Comments: baseline dementia. Pt w/ decreased awareness of clavicle fracture, unable to follow command  to decrease weight bear on LUE, even after multiple reminders.       Exercises General Exercises - Lower Extremity Long Arc Quad: AROM;15 reps;Both;Seated Hip Flexion/Marching: RJJO;84 reps;Standing;Both    General Comments General comments (skin integrity, edema, etc.): VSS throughout session       Pertinent Vitals/Pain Faces Pain Scale: Hurts a little bit Pain Location: no pain at rest, R LE hip pain w/ chair mobility into flexion and extension  Pain Descriptors / Indicators: Aching;Discomfort;Sore Pain Intervention(s): Limited activity within patient's tolerance;Repositioned;Monitored during session    Home Living                      Prior Function            PT Goals (current goals can now be found in the care plan section) Progress towards PT goals: Progressing toward goals    Frequency    Min 3X/week      PT Plan Current plan remains appropriate    Co-evaluation              AM-PAC PT "6 Clicks" Daily Activity  Outcome Measure  Difficulty turning over in bed (including adjusting bedclothes, sheets and blankets)?: Unable Difficulty moving from lying on back to sitting on the side of the bed? : Unable Difficulty sitting down on and standing up from a chair with arms (e.g., wheelchair, bedside commode, etc,.)?: Unable Help needed moving to and from a bed to chair (including a wheelchair)?: A Lot Help needed walking in hospital room?: A Little Help needed climbing 3-5 steps with a railing? : A Lot 6 Click Score: 10    End of Session Equipment Utilized During Treatment: Gait belt Activity Tolerance: Patient tolerated treatment well Patient left: in chair;with call bell/phone within reach;with chair alarm set Nurse Communication: Mobility status PT Visit Diagnosis: Unsteadiness on feet (R26.81);Other abnormalities of gait and mobility (R26.89);History of falling (Z91.81);Difficulty in walking, not elsewhere classified (R26.2);Pain Pain -  Right/Left: Right Pain - part of body: Shoulder     Time: 1201-1228 PT Time Calculation (min) (ACUTE ONLY): 27 min  Charges:  $Therapeutic Exercise: 8-22 mins $Therapeutic Activity: 8-22 mins                     Samuella Bruin, [place]  Acute Rehab [phone]    Samuella Bruin 07/23/2018, 1:59 PM

## 2018-07-24 DIAGNOSIS — R9389 Abnormal findings on diagnostic imaging of other specified body structures: Secondary | ICD-10-CM

## 2018-07-24 LAB — GLUCOSE, CAPILLARY
GLUCOSE-CAPILLARY: 161 mg/dL — AB (ref 70–99)
GLUCOSE-CAPILLARY: 202 mg/dL — AB (ref 70–99)

## 2018-07-24 LAB — CBC
HCT: 23.8 % — ABNORMAL LOW (ref 39.0–52.0)
HEMOGLOBIN: 7.8 g/dL — AB (ref 13.0–17.0)
MCH: 31.1 pg (ref 26.0–34.0)
MCHC: 32.8 g/dL (ref 30.0–36.0)
MCV: 94.8 fL (ref 78.0–100.0)
Platelets: 92 10*3/uL — ABNORMAL LOW (ref 150–400)
RBC: 2.51 MIL/uL — ABNORMAL LOW (ref 4.22–5.81)
RDW: 15.3 % (ref 11.5–15.5)
WBC: 6.1 10*3/uL (ref 4.0–10.5)

## 2018-07-24 LAB — OCCULT BLOOD X 1 CARD TO LAB, STOOL: Fecal Occult Bld: NEGATIVE

## 2018-07-24 LAB — BASIC METABOLIC PANEL
Anion gap: 7 (ref 5–15)
BUN: 86 mg/dL — ABNORMAL HIGH (ref 8–23)
CALCIUM: 7.8 mg/dL — AB (ref 8.9–10.3)
CHLORIDE: 107 mmol/L (ref 98–111)
CO2: 22 mmol/L (ref 22–32)
CREATININE: 2.71 mg/dL — AB (ref 0.61–1.24)
GFR calc non Af Amer: 19 mL/min — ABNORMAL LOW (ref >60)
GFR, EST AFRICAN AMERICAN: 22 mL/min — AB (ref >60)
Glucose, Bld: 167 mg/dL — ABNORMAL HIGH (ref 70–99)
Potassium: 4.4 mmol/L (ref 3.5–5.1)
SODIUM: 136 mmol/L (ref 135–145)

## 2018-07-24 MED ORDER — GLUCERNA SHAKE PO LIQD: 237.0000 mL | Freq: Every day | ORAL | Status: DC

## 2018-07-24 MED ORDER — CARVEDILOL 25 MG PO TABS: 25.0000 mg | ORAL_TABLET | Freq: Two times a day (BID) | ORAL | Status: DC

## 2018-07-24 MED ORDER — SENNOSIDES-DOCUSATE SODIUM 8.6-50 MG PO TABS: 1.0000 | ORAL_TABLET | Freq: Every evening | ORAL | Status: AC | PRN

## 2018-07-24 MED ORDER — FERROUS SULFATE 325 (65 FE) MG PO TABS: 325.0000 mg | ORAL_TABLET | Freq: Two times a day (BID) | ORAL | Status: DC

## 2018-07-24 MED ORDER — POLYETHYLENE GLYCOL 3350 17 G PO PACK: 17.0000 g | PACK | Freq: Every day | ORAL | Status: AC

## 2018-07-24 MED ORDER — DOCUSATE SODIUM 100 MG PO CAPS: 100.0000 mg | ORAL_CAPSULE | Freq: Two times a day (BID) | ORAL | Status: DC

## 2018-07-24 MED ORDER — AMLODIPINE BESYLATE 10 MG PO TABS: 10.0000 mg | ORAL_TABLET | Freq: Every day | ORAL | Status: DC

## 2018-07-24 MED ORDER — INSULIN ASPART 100 UNIT/ML ~~LOC~~ SOLN: 0.0000 [IU] | Freq: Three times a day (TID) | SUBCUTANEOUS | Status: DC

## 2018-07-24 NOTE — Discharge Instructions (Signed)
1. Change dressings as needed 2. May shower but keep incisions covered and dry 3. Take lovenox to prevent blood clots 4. Take stool softeners as needed 5. Take pain meds as needed   Additional discharge instructions:  Please get your medications reviewed and adjusted by your Primary MD.  Please request your Primary MD to go over all Hospital Tests and Procedure/Radiological results at the follow up, please get all Hospital records sent to your Prim MD by signing hospital release before you go home.  If you had Pneumonia of Lung problems at the Hospital: Please get a 2 view Chest X ray done in 6-8 weeks after hospital discharge or sooner if instructed by your Primary MD.  If you have Congestive Heart Failure: Please call your Cardiologist or Primary MD anytime you have any of the following symptoms:  1) 3 pound weight gain in 24 hours or 5 pounds in 1 week  2) shortness of breath, with or without a dry hacking cough  3) swelling in the hands, feet or stomach  4) if you have to sleep on extra pillows at night in order to breathe  Follow cardiac low salt diet and 1.5 lit/day fluid restriction.  If you have diabetes Accuchecks 4 times/day, Once in AM empty stomach and then before each meal. Log in all results and show them to your primary doctor at your next visit. If any glucose reading is under 80 or above 300 call your primary MD immediately.  If you have Seizure/Convulsions/Epilepsy: Please do not drive, operate heavy machinery, participate in activities at heights or participate in high speed sports until you have seen by Primary MD or a Neurologist and advised to do so again.  If you had Gastrointestinal Bleeding: Please ask your Primary MD to check a complete blood count within one week of discharge or at your next visit. Your endoscopic/colonoscopic biopsies that are pending at the time of discharge, will also need to followed by your Primary MD.  Get Medicines reviewed  and adjusted. Please take all your medications with you for your next visit with your Primary MD  Please request your Primary MD to go over all hospital tests and procedure/radiological results at the follow up, please ask your Primary MD to get all Hospital records sent to his/her office.  If you experience worsening of your admission symptoms, develop shortness of breath, life threatening emergency, suicidal or homicidal thoughts you must seek medical attention immediately by calling 911 or calling your MD immediately  if symptoms less severe.  You must read complete instructions/literature along with all the possible adverse reactions/side effects for all the Medicines you take and that have been prescribed to you. Take any new Medicines after you have completely understood and accpet all the possible adverse reactions/side effects.   Do not drive or operate heavy machinery when taking Pain medications.   Do not take more than prescribed Pain, Sleep and Anxiety Medications  Special Instructions: If you have smoked or chewed Tobacco  in the last 2 yrs please stop smoking, stop any regular Alcohol  and or any Recreational drug use.  Wear Seat belts while driving.  Please note You were cared for by a hospitalist during your hospital stay. If you have any questions about your discharge medications or the care you received while you were in the hospital after you are discharged, you can call the unit and asked to speak with the hospitalist on call if the hospitalist that took care  of you is not available. Once you are discharged, your primary care physician will handle any further medical issues. Please note that NO REFILLS for any discharge medications will be authorized once you are discharged, as it is imperative that you return to your primary care physician (or establish a relationship with a primary care physician if you do not have one) for your aftercare needs so that they can reassess your  need for medications and monitor your lab values.  You can reach the hospitalist office at phone [phone] or fax [phone]   If you do not have a primary care physician, you can call [phone] for a physician referral.

## 2018-07-24 NOTE — Progress Notes (Addendum)
Discharge plan explained to patient and two sons at bedside, along with spouse who has dementia.  Planned PTAR transportation approximately at 3pm to [facility]. VSS. CCMD notified of discharge.   Pt resting comfortably in bed with family at bedside. Will continue to monitor until transport.   1544: PTAR at bedside. PIV DC, hemostasis achieved.

## 2018-07-24 NOTE — Clinical Social Work Note (Signed)
Patient and son, Louie Casa, at bedside aware of plan for discharge to [facility] today.  Dayton Scrape, [place]

## 2018-07-24 NOTE — Discharge Summary (Addendum)
Physician Discharge Summary  Chad Mcmillan GQB:[id number] DOB: [birth year]  PCP: Binnie Rail, MD  Admit date: 07/17/2018 Discharge date: 07/24/2018  Recommendations for Outpatient Follow-up:  1. MD at SNF with repeat labs (CBC & BMP) on 07/25/2018.  Recommend following same labs twice weekly for the next week or as deemed necessary. 2. Dr. Eduard Roux, Orthopedics in 2 weeks for suture removal and wound recheck. 3. Daune Perch, NP/cardiology on 08/07/2018 at 10 AM. 4. Dr. Billey Gosling, PCP: Patient has prior appointment on 08/10/2018 at 2 PM. 5. Follow-up chest x-ray in 4 weeks to insure resolution of abnormal findings.  Consider outpatient palliative care consultation for goals of care.  Home Health: Patient being discharged to [facility]. Equipment/Devices: None.  Discharge Condition: Improved and stable. CODE STATUS: Full Diet recommendation: Heart healthy & diabetic diet.  Discharge Diagnoses:  Principal Problem:   Displaced fracture of greater trochanter of right femur, initial encounter for closed fracture [facility]) Active Problems:   Essential hypertension   History of stroke   DM (diabetes mellitus) ([place])   Anemia   AKI (acute kidney injury) ([place])   CKD (chronic kidney disease), stage IV (HCC)   Dementia   Closed displaced fracture of greater trochanter of right femur (HCC)   History of thrombocytopenia   Pleural effusion, left   Abnormal finding on chest xray   CAP (community acquired pneumonia)   Respiratory failure with hypoxia ([place])   Closed fracture of acromial end of left clavicle   Paroxysmal atrial fibrillation with RVR ([place])   Acute diastolic heart failure (HCC)   S/P CABG (coronary artery bypass graft)   Brief Summary: 85 year old male with PMH of CAD, CVA, type 2 DM, HTN, HLD, stage IV chronic kidney disease, chronic anemia, thrombocytopenia and dementia presented to ED on 07/17/2018 following a fall at home and right-sided hip pain with associated mild  dyspnea and occasional cough.  He was walking with his cane, tripped and fell on his right side, denied hitting his head or LOC.  He was admitted for right greater trochanteric fracture and underwent fixation by orthopedics 8/30.  Hospital course complicated by acute on chronic kidney disease, hyperkalemia, acute on chronic diastolic CHF following blood transfusion, pneumonia, acute respiratory failure with hypoxia and transiently required BiPAP, new onset A. fib, progressive anemia.  Cardiology consulted.   Assessment & Plan:   Acute hypoxic respiratory failure: Suspected more from decompensated CHF rather than pneumonia.  He was briefly on high flow oxygen and then BiPAP.  CCM consulted and signed off.  Treating underlying cause.  Hypoxia resolved.  Right greater trochanteric fracture: Sustained after mechanical fall at home.  Orthopedics consulted and patient underwent surgery/intramedullary implant on 8/30.  Management per orthopedics.  I discussed with Dr. Erlinda Hong on day of discharge: Weightbearing as tolerated on operated right lower extremity, since no DVT prophylactic dose Lovenox or anticoagulation (see discussion below), okay to resume prior home dose of aspirin 81 mg daily for DVT prophylaxis and outpatient follow-up with him in 2 weeks.  Paroxysmal atrial fibrillation: Cardiology consulted. CHADSVASC=8.  Currently in sinus rhythm and occasional PAF.  Remains on carvedilol 25 mg twice daily.  Briefly on Eliquis but discontinued by cardiology 9/4 due to progressively worsening anemia despite no reported major bleeding and thrombocytopenia. It is possible that patient had chronic anemia related to chronic kidney disease and his hemoglobin may have been in the mid 8 grams range PTA which subsequently dropped to the 7 range, some of which may  have been due to his fracture and perioperative blood loss.  No overt bleeding noted.  Patient is at high risk for bleeding complications if on Eliquis due to  his advanced age, renal insufficiency and fall risk.  I discussed this in detail again with Dr. Radford Pax, Cardiology on day of discharge and we agreed that weighing risks versus benefits, it is best to hold off on anticoagulation at this time and continue prior home dose of aspirin 81 mg daily.  Decision regarding anticoagulation will be revisited during outpatient cardiology follow-up.  I discussed this in detail with patient's son who verbalized understanding.  Essential hypertension: Controlled.  Continue amlodipine 10 mg daily and carvedilol 25 mg twice daily.  Lisinopril discontinued due to acute on chronic kidney disease.  BP of 87/62 recorded this morning does not seem to be accurate and not his usual trend.  CAD: Asymptomatic of chest pain.  Continue aspirin, carvedilol and atorvastatin.  Aspirin on hold due to progressive anemia.  Acute on chronic diastolic CHF: Cardiology following.  TTE: LVEF 50-55%.  Briefly on IV Lasix but discontinued due to worsening renal insufficiency.  -5.9 L since admission.  Clinically appears compensated and euvolemic.  Remains stable.  Reassess at [facility] and outpatient cardiology visit regarding further needs for diuretics.  Acute on stage IV chronic kidney disease: Likely precipitated by aggressive diuresis.  Lasix discontinued.  Lisinopril on hold.  Baseline creatinine 1.8-2.2 in April/May.  Creatinine has gone up from 2.7-2.9.  Creatinine now stable in the 2.7 range for the last 2 days which may become his new baseline.  Hyperkalemia: Status post insulin, dextrose, bicarbonate, calcium gluconate and Kayexalate.  Resolved.  Anemia: Baseline hemoglobin in April and May ranged between 9-10.  Presented with hemoglobin of 8.4.  Partial 1 unit transfusion done early on in admission but discontinued due to acute pulmonary edema.  No overt bleeding.  Anemia panel reviewed: Iron 31, ferritin 268.  Status post dose of IV iron.  Suspect worsening anemia to be  multifactorial: Anemia of chronic kidney disease, anemia of chronic disease, some acute blood loss from fracture and surgery.  Hemoglobin dropped to 7.1 on 9/4, symptomatic of dizziness.  Status post 1 unit PRBC 9/4 and hemoglobin is improved to 8.3.  Hemoglobin has slightly dropped to 7.8 which may still be from equilibration posttransfusion.  Undressed patient on 9/6 and examined in detail without any bruising or external evidence of bleeding.  Rectal exam performed, soft brown stools, FOBT negative.  Again, no overt bleeding noted.  Follow CBCs closely at SNF and consider transfusion if hemoglobin 7 g or less.  Consider starting Aranesp.  Thrombocytopenia, chronic and intermittent: Appears to be at baseline.  No intervention.  Left pleural effusion/community-acquired pneumonia: Completed 5 days course of cefepime 9/4.  MRSA PCR negative. Needs follow-up chest x-ray in 4 weeks to ensure resolution of pneumonia findings.  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  Continue SSI at discharge.  Resume prior home dose of Prandin (discussed with pharmacy) at discharge.  History of CVA: Continue statins and aspirin.  See anticoagulation discussion above..  Dementia: Mental status likely at baseline.  Continue Aricept.  Bradycardia: Had an episode of bradycardia earlier in admission while getting up with PT.  Resolved.  Left upper extremity deformity, chronic: X-ray showed mildly impacted nondisplaced fracture of the distal aspect of the left clavicle just medial to the acromioclavicular joint and humerus x-ray showed old ununited fracture of the mid humeral  diaphysis with chronic posttraumatic deformity.  Pain management and PRN sling.  Outpatient follow-up with orthopedics.  Acute urinary retention: As reported by nursing.  Discontinue Foley catheter 9/5.  Resolved and voiding without difficulty.  Hypokalemia: Replaced.  Adult failure to thrive:  Multifactorial due to advanced age, frail physical status and multiple severe significant comorbidities.  Cardiology had recommended palliative care consultation for goals.  Patient however seems to be gradually improving and this may be considered as an outpatient.  Uncomplicated right near complete inguinal hernia: Outpatient follow-up as deemed necessary.  Abnormal chest x-ray 07/17/2018: Report mentions concern for neoplastic process in left lower lobe and consideration of CT chest to further evaluate.  May consider noncontrasted CT chest as outpatient but even if malignancy diagnosed, patient will likely not be a candidate for any aggressive intervention.  Prostate abnormality on MRI of right hip: Mention made by radiologist regarding possibility of prostate cancer.  Again as stated above, even if diagnosed, patient would likely not be a candidate for any aggressive intervention.   Consultants:  Orthopedics Cardiology CCM.  Procedures:  Treatment of right intertrochanteric hip fracture with IM nail 8/30 BiPAP-now off Foley catheter-DC 9/5   Discharge Instructions  Discharge Instructions    (HEART FAILURE PATIENTS) Call MD:  Anytime you have any of the following symptoms: 1) 3 pound weight gain in 24 hours or 5 pounds in 1 week 2) shortness of breath, with or without a dry hacking cough 3) swelling in the hands, feet or stomach 4) if you have to sleep on extra pillows at night in order to breathe.   Complete by:  As directed    Call MD for:  difficulty breathing, headache or visual disturbances   Complete by:  As directed    Call MD for:  extreme fatigue   Complete by:  As directed    Call MD for:  persistant dizziness or light-headedness   Complete by:  As directed    Call MD for:  persistant nausea and vomiting   Complete by:  As directed    Call MD for:  redness, tenderness, or signs of infection (pain, swelling, redness, odor or green/yellow discharge around incision site)    Complete by:  As directed    Call MD for:  severe uncontrolled pain   Complete by:  As directed    Call MD for:  temperature >100.4   Complete by:  As directed    Diet - low sodium heart healthy   Complete by:  As directed    Diet Carb Modified   Complete by:  As directed    Increase activity slowly   Complete by:  As directed    Weight bearing as tolerated   Complete by:  As directed        Medication List    STOP taking these medications   lisinopril 5 MG tablet Commonly known as:  PRINIVIL,ZESTRIL   LORazepam 0.5 MG tablet Commonly known as:  ATIVAN     TAKE these medications   amLODipine 10 MG tablet Commonly known as:  NORVASC Take 1 tablet (10 mg total) by mouth daily. Start taking on:  07/25/2018 What changed:    medication strength  how much to take   aspirin EC 81 MG tablet Take 1 tablet (81 mg total) by mouth daily.   atorvastatin 40 MG tablet Commonly known as:  LIPITOR Take 2 tablets (80 mg total) by mouth daily at 6 PM.   calcium-vitamin D 500-200 MG-UNIT  tablet Commonly known as:  OSCAL WITH D Take 1 tablet by mouth 3 (three) times daily.   carvedilol 25 MG tablet Commonly known as:  COREG Take 1 tablet (25 mg total) by mouth 2 (two) times daily with a meal. What changed:    medication strength  how much to take   cyanocobalamin 1000 MCG tablet Take 1 tablet (1,000 mcg total) by mouth daily.   docusate sodium 100 MG capsule Commonly known as:  COLACE Take 1 capsule (100 mg total) by mouth 2 (two) times daily.   donepezil 5 MG tablet Commonly known as:  ARICEPT Take 1 tablet (5 mg total) by mouth at bedtime.   feeding supplement (GLUCERNA SHAKE) Liqd Take 237 mLs by mouth at bedtime.   ferrous sulfate 325 (65 FE) MG tablet Take 1 tablet (325 mg total) by mouth 2 (two) times daily with a meal.   HYDROcodone-acetaminophen 7.5-325 MG tablet Commonly known as:  NORCO Take 1-2 tablets by mouth every 6 (six) hours as needed for  moderate pain.   insulin aspart 100 UNIT/ML injection Commonly known as:  novoLOG Inject 0-9 Units into the skin 3 (three) times daily with meals. Correction coverage: Sensitive (thin, NPO, renal) CBG < 70: implement hypoglycemia protocol CBG 70 - 120: 0 units CBG 121 - 150: 1 unit CBG 151 - 200: 2 units CBG 201 - 250: 3 units CBG 251 - 300: 5 units CBG 301 - 350: 7 units CBG 351 - 400: 9 units CBG > 400: call MD.   meclizine 12.5 MG tablet Commonly known as:  ANTIVERT TAKE 1 TABLET BY MOUTH THREE TIMES DAILY AS NEEDED FOR DIZZINESS What changed:    how much to take  how to take this  when to take this  reasons to take this  additional instructions   nitroGLYCERIN 0.4 MG SL tablet Commonly known as:  NITROSTAT Place 1 tablet (0.4 mg total) under the tongue every 5 (five) minutes as needed. For chest pain   polyethylene glycol packet Commonly known as:  MIRALAX / GLYCOLAX Take 17 g by mouth daily. Start taking on:  07/25/2018   repaglinide 0.5 MG tablet Commonly known as:  PRANDIN Take 1 tablet (0.5 mg total) by mouth 3 (three) times daily before meals.   senna-docusate 8.6-50 MG tablet Commonly known as:  Senokot-S Take 1 tablet by mouth at bedtime as needed for mild constipation or moderate constipation.       Contact information for follow-up providers    Leandrew Koyanagi, MD In 2 weeks.   Specialty:  Orthopedic Surgery Why:  For suture removal, For wound re-check Contact information: [place] [address] [phone]        Daune Perch, NP Follow up on 08/07/2018.   Specialty:  Nurse Practitioner Why:  08/07/18 at [place] Plan information: [place] [address] [phone]        MD at SNF. Schedule an appointment as soon as possible for a visit on 07/25/2018.   Why:  To be seen with repeat labs (CBC & BMP) on 07/25/2018 followed by same labs twice a week for the next week or as deemed  necessary.       Binnie Rail, MD Follow up on 08/10/2018.   Specialty:  Internal Medicine Why:  2 pm. Contact information: [place] [address] [phone]            Contact information for  after-discharge care    Destination    [facility] Preferred SNF .   Service:  Skilled Nursing Contact information: [place] [phone]                 No Known Allergies    Procedures/Studies: Dg Chest 1 View  Result Date: 07/17/2018 CLINICAL DATA:  84 year old male with fall. EXAM: CHEST  1 VIEW COMPARISON:  Chest radiograph dated 02/27/2018 and CT dated 02/27/2018 FINDINGS: There is patchy area of airspace opacity in the right lower lung field and infrahilar region. An area of opacity is also noted in the left lower lobe/retrocardiac area. Although these may the infectious etiology, persistent and interval development of left-sided pleural effusion since the prior radiograph is concerning for underlying neoplasm involving the left lower lobe. Further evaluation with chest CT with IV contrast is recommended. There is a small left pleural effusion. No pneumothorax. There is mild cardiomegaly. Atherosclerotic calcification of the aortic arch. No acute osseous pathology. IMPRESSION: Persistent airspace densities primarily involving the left lower lobe as well as in the right lower lung field with interval development or increase in the size of the left-sided pleural effusion. Findings may be infectious in etiology but concerning for a neoplastic process in the left lower lobe. Further evaluation with CT with IV contrast is recommended. Electronically Signed   By: Anner Crete M.D.   On: 07/17/2018 03:33   Dg Lumbar Spine Complete  Result Date: 07/17/2018 CLINICAL DATA:  84 y/o  M; fall with right hip pain. EXAM: LUMBAR SPINE - COMPLETE 4+ VIEW COMPARISON:  01/12/2018 CT abdomen and pelvis FINDINGS:  Five lumbar type non-rib-bearing vertebral bodies. Mild reverse S curvature of lumbar spine. Normal lumbar lordosis without listhesis. Stable L2 moderate compression deformity. No new fracture identified. Calcific aortic atherosclerosis. IMPRESSION: Stable chronic moderate L2 compression deformity. No acute fracture identified. Aortic atherosclerosis. Electronically Signed   By: Kristine Garbe M.D.   On: 07/17/2018 03:33   Ct Head Wo Contrast  Result Date: 07/17/2018 CLINICAL DATA:  84 y/o  M; vertical, episodic, peripheral. EXAM: CT HEAD WITHOUT CONTRAST TECHNIQUE: Contiguous axial images were obtained from the base of the skull through the vertex without intravenous contrast. COMPARISON:  02/27/2018 CT head FINDINGS: Brain: No evidence of acute infarction, hemorrhage, hydrocephalus, extra-axial collection or mass lesion/mass effect. Stable small chronic infarcts within the bilateral cerebellar hemispheres. Stable chronic microvascular ischemic changes and volume loss of the brain Vascular: Calcific atherosclerosis of the vertebral arteries and carotid siphons. No hyperdense vessel identified. Skull: Normal. Negative for fracture or focal lesion. Sinuses/Orbits: Mild ethmoid and maxillary sinus mucosal thickening. Normal aeration of mastoid air cells. Bilateral intra-ocular lens replacement. Other: None. IMPRESSION: 1. No acute intracranial abnormality identified. 2. Stable chronic microvascular ischemic changes and volume loss of the brain. Stable small chronic infarcts in the bilateral cerebellar hemispheres. Electronically Signed   By: Kristine Garbe M.D.   On: 07/17/2018 03:37   Mr Hip Right Wo Contrast  Result Date: 07/17/2018 CLINICAL DATA:  Hip fracture EXAM: MR OF THE RIGHT HIP WITHOUT CONTRAST TECHNIQUE: Multiplanar, multisequence MR imaging was performed. No intravenous contrast was administered. COMPARISON:  07/17/2018 radiograph FINDINGS: Bones: Right hip intertrochanteric  fracture with intersecting fracture extending transversely through the greater trochanter. The intertrochanteric portion is relatively nondisplaced. Thin band of accentuated T2 and low T1 signal in the vicinity of the anterior acetabular wall/lateral base of the superior pubic ramus as shown on images 2-5 of  series 17, mild stress fracture is a distinct possibility but this is not felt to represent a completed fracture. No concomitant fracture of the inferior pubic ramus is identified. Presacral edema noted. The sacrum is not fully included on any of the sequences, and it is difficult to exclude a small transverse fracture at the level of the sacrococcygeal junction. Articular cartilage and labrum Articular cartilage:  No focal chondral defect. Labrum: Small anterior superior labral tear on images 18-19 of series 15. This is more prominent than I would expect for the normal recess. I also suspect a contralateral (left-sided) labral tear with paralabral cyst. Joint or bursal effusion Joint effusion:  Small right and trace left joint effusions. Bursae: Notable fluid in the right trochanteric bursa adjacent to the greater trochanteric fracture. There is also mild contralateral (left-sided) trochanteric bursitis. Muscles and tendons Muscles and tendons: Notable edema within along the gluteus medius, gluteus maximus, quadratus femoris, obturator externus, right hip adductor musculature, distal iliopsoas, and proximal vastus lateralis muscles. There is some lesser edema in the proximal semitendinosus muscles bilaterally and in the right gluteus maximus. Note is made of hip adductor muscular edema on the contralateral, left side. There is also a small amount of edema tracking below the right iliacus muscle. Other findings Miscellaneous: There is potentially some low T2 signal in the left prostate peripheral zone on image 15/13. Sigmoid colon diverticulosis. Indirect right inguinal hernia contains loops of bowel extending  down into the scrotum. Separate small bilateral scrotal hydroceles. IMPRESSION: 1. Acute intertrochanteric fracture of the right hip, with intersecting transverse greater trochanteric fracture. 2. Potential mild stress fracture/stress injury in the anterior wall of the right acetabulum. 3. Questionable transverse fracture of the sacrum near the sacrococcygeal junction. There is some presacral edema in this vicinity and questionable osseous edema. Today's exam was not optimized to assess the sacrum and the sacrum is only seen in a very disadvantageous manner which reduces positive predictive value. 4. Abnormal edema in multiple muscle groups in the vicinity of the right hip as described above. Considerable right trochanteric bursitis some of which is likely posttraumatic. Small right and trace left hip joint effusions. 5. Bilateral acetabular labral tears. 6. Left-sided (contralateral) trochanteric bursitis. 7. Low T2 signal in the left peripheral zone of the prostate gland noted. This could be postinflammatory but can also be from prostate cancer. Correlate with PSA level. 8. Indirect right inguinal hernia contains loops of bowel extending down into the scrotum. Separate small bilateral scrotal hydroceles. Electronically Signed   By: Van Clines M.D.   On: 07/17/2018 08:18   Dg Chest Port 1 View  Result Date: 07/18/2018 CLINICAL DATA:  Hypoxemia per ordering notes. EXAM: PORTABLE CHEST 1 VIEW COMPARISON:  07/17/2018 FINDINGS: There are perihilar infiltrates bilaterally, confluent at the LEFT lung base. Heart size is UPPER normal. There is atherosclerotic calcification of the thoracic aorta. Small bilateral pleural effusions are present. Patient is kyphotic. IMPRESSION: Bilateral pulmonary infiltrates, most consistent with asymmetric pulmonary edema. Infection could have a similar appearance. Aortic atherosclerosis. (ICD10-I70.0) Electronically Signed   By: Nolon Nations M.D.   On: 07/18/2018 18:27    Dg Shoulder Left  Result Date: 07/18/2018 CLINICAL DATA:  84 year old male with history of recent fall complaining of pain in the left shoulder. EXAM: LEFT SHOULDER - 2+ VIEW COMPARISON:  No priors. FINDINGS: Two views of the left shoulder demonstrate a mildly impacted nondisplaced fracture of the distal aspect of the left clavicle. Visualized portions of the scapula appear intact. Old  healed fracture with posttraumatic deformity in the mid humeral diaphysis. IMPRESSION: 1. Mildly impacted nondisplaced fracture of the distal aspect of the left clavicle just medial to the acromioclavicular joint. Electronically Signed   By: Vinnie Langton M.D.   On: 07/18/2018 18:01   Dg Humerus Left  Result Date: 07/18/2018 CLINICAL DATA:  84 year old male with history of recent falls complaining of pain in the left shoulder. EXAM: LEFT HUMERUS - 2+ VIEW COMPARISON:  Left humerus radiograph 05/24/2012. FINDINGS: Acute impacted nondisplaced fracture of the distal aspect of the left clavicle, incompletely imaged (please see dedicated left shoulder radiographs for full description). Old ununited fracture of the mid humeral diaphysis with posttraumatic deformity. No acute displaced fracture of the left humerus. IMPRESSION: 1. No acute abnormality of the left humerus. 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 Electronically Signed   By: Vinnie Langton M.D.   On: 07/18/2018 18:03   Dg C-arm 1-60 Min  Result Date: 07/17/2018 CLINICAL DATA:  IM nail fixation of right femur EXAM: DG C-ARM 61-120 MIN; RIGHT FEMUR 2 VIEWS COMPARISON:  MRI 07/17/2018 FINDINGS: 74 seconds of fluoroscopic time was utilized. AP and frog-leg lateral views of the hip and distal femur were acquired demonstrating presence of an intramedullary nail extending to the distal metadiaphysis of the right femur. No complicating features. Fine bony detail is limited by the C-arm fluoroscopic technique.  IMPRESSION: 74 seconds of fluoroscopic time utilized for placement of intramedullary nail within the right femur. No immediate intraoperative complicating features are noted. Electronically Signed   By: Ashley Royalty M.D.   On: 07/17/2018 14:08   Dg Hip Unilat With Pelvis 2-3 Views Right  Result Date: 07/17/2018 CLINICAL DATA:  84 y/o  M; fall with right hip pain. EXAM: DG HIP (WITH OR WITHOUT PELVIS) 2-3V RIGHT COMPARISON:  None. FINDINGS: Lucency traversing the greater trochanter likely representing a mildly displaced fracture. No additional proximal femur fracture identified. No appreciable pelvic fracture or pelvic diastasis. Vascular calcifications noted. IMPRESSION: Mildly displaced acute fracture of the right femur greater trochanter. No additional fracture identified. Consider further evaluation with right hip CT. Electronically Signed   By: Kristine Garbe M.D.   On: 07/17/2018 03:31   Dg Femur, Min 2 Views Right  Result Date: 07/17/2018 CLINICAL DATA:  IM nail fixation of right femur EXAM: DG C-ARM 61-120 MIN; RIGHT FEMUR 2 VIEWS COMPARISON:  MRI 07/17/2018 FINDINGS: 74 seconds of fluoroscopic time was utilized. AP and frog-leg lateral views of the hip and distal femur were acquired demonstrating presence of an intramedullary nail extending to the distal metadiaphysis of the right femur. No complicating features. Fine bony detail is limited by the C-arm fluoroscopic technique. IMPRESSION: 74 seconds of fluoroscopic time utilized for placement of intramedullary nail within the right femur. No immediate intraoperative complicating features are noted. Electronically Signed   By: Ashley Royalty M.D.   On: 07/17/2018 14:08      Subjective: "I am fine".  Reports mild pain on operated right hip precipitated by movements.  No chest pain, dyspnea, palpitations, dizziness, lightheadedness or bleeding reported.  As per RN, no acute issues noted.  Patient has been voiding without difficulty after  removal of catheter 9/4.  Discharge Exam:  Vitals:   07/24/18 0313 07/24/18 0700 07/24/18 0721 07/24/18 1127  BP:  (!) 121/30 (!) 121/30 (!) 87/62  Pulse:  60 63 62  Resp:  15 (!) 31 (!) 26  Temp:   97.6 F (36.4 C) 97.8  F (36.6 C)  TempSrc:   Oral Oral  SpO2:  98% 99% 99%  Weight: 78.3 kg     Height:        General exam: Pleasant elderly male, moderately built, frail and chronically ill looking, lying comfortably supine in bed. Respiratory system: Clear to auscultation. Respiratory effort normal. Cardiovascular system: S1 & S2 heard, RRR. No JVD, murmurs, rubs, gallops or clicks. No pedal edema.    Telemetry personally reviewed: Sinus rhythm with BBB morphology. Gastrointestinal system: Abdomen is nondistended, soft and nontender. No organomegaly or masses felt. Normal bowel sounds heard.   Reducible uncomplicated near complete right indirect inguinal hernia which can be completely reduced without difficulty and gurgling felt.   I performed a rectal exam which showed soft brown stools and no bleeding. Central nervous system: Alert and oriented x2. No focal neurological deficits. Extremities: Symmetric 5 x 5 power.  Right lower extremity movements restricted due to postop right hip pain appears normal.  Right hip surgical site without acute findings or significant bruising or hematoma. Skin: Patient was undressed completely with assistance from his nurse and examined in detail.  No evidence of injuries, bruising or bleeding noted. Psychiatry: Judgement and insight appear somewhat impaired. Mood & affect appropriate.     The results of significant diagnostics from this hospitalization (including imaging, microbiology, ancillary and laboratory) are listed below for reference.     Microbiology: Recent Results (from the past 240 hour(s))  MRSA PCR Screening     Status: None   Collection Time: 07/18/18 12:00 AM  Result Value Ref Range Status   MRSA by PCR NEGATIVE NEGATIVE Final     Comment:        The GeneXpert MRSA Assay (FDA approved for NASAL specimens only), is one component of a comprehensive MRSA colonization surveillance program. It is not intended to diagnose MRSA infection nor to guide or monitor treatment for MRSA infections. Performed at [facility], [place] [address] [place], [place] [address]   Culture, blood (x 2)     Status: None   Collection Time: 07/18/18  8:14 PM  Result Value Ref Range Status   Specimen Description BLOOD RIGHT ARM  Final   Special Requests   Final    BOTTLES DRAWN AEROBIC ONLY Blood Culture adequate volume   Culture   Final    NO GROWTH 5 DAYS Performed at [facility], [address], [place], [place] [address]    Report Status 07/23/2018 FINAL  Final  Culture, blood (x 2)     Status: None   Collection Time: 07/18/18  8:18 PM  Result Value Ref Range Status   Specimen Description BLOOD LEFT ARM  Final   Special Requests   Final    BOTTLES DRAWN AEROBIC AND ANAEROBIC Blood Culture adequate volume   Culture   Final    NO GROWTH 5 DAYS Performed at [facility] [address], [place], [place] [address]    Report Status 07/23/2018 FINAL  Final     Labs: CBC: Recent Labs  Lab 07/20/18 0330 07/21/18 0237 07/22/18 0310 07/22/18 2243 07/23/18 0324 07/24/18 0709  WBC 7.7 6.0 6.6  --  5.5 6.1  HGB 7.5* 7.5* 7.1* 8.2* 8.3* 7.8*  HCT 23.0* 23.0* 21.5* 25.4* 25.5* 23.8*  MCV 95.8 97.5 96.8  --  95.5 94.8  PLT 81* 83* 93*  --  90* 92*   Basic Metabolic Panel: Recent Labs  Lab 07/20/18 0330 07/21/18 0237 07/22/18  0310 07/23/18 0324 07/24/18 0709  NA 141 139 137 137 136  K 3.4* 3.7 4.0 4.2 4.4  CL 110 106 104 107 107  CO2 24 26 24  21* 22  GLUCOSE 130* 130* 149* 236* 167*  BUN 57* 57* 71* 78* 86*  CREATININE 2.77* 2.72* 2.99* 2.74* 2.71*  CALCIUM 7.6* 7.5* 7.5* 7.6* 7.8*   CBG: Recent Labs  Lab 07/23/18 0810 07/23/18 1232 07/23/18 1651 07/23/18 2108 07/24/18 0757  GLUCAP 178* 238*  231* 289* 161*    Urinalysis    Component Value Date/Time   COLORURINE YELLOW 07/17/2018 0232   APPEARANCEUR CLEAR 07/17/2018 0232   LABSPEC 1.012 07/17/2018 0232   PHURINE 5.0 07/17/2018 0232   GLUCOSEU 50 (A) 07/17/2018 0232   GLUCOSEU NEGATIVE 09/07/2014 1334   HGBUR SMALL (A) 07/17/2018 0232   BILIRUBINUR NEGATIVE 07/17/2018 0232   KETONESUR NEGATIVE 07/17/2018 0232   PROTEINUR >=300 (A) 07/17/2018 0232   UROBILINOGEN 0.2 09/07/2014 1334   NITRITE NEGATIVE 07/17/2018 0232   LEUKOCYTESUR NEGATIVE 07/17/2018 0232    I discussed in detail with patient's son.  Updated care and answered all questions.  Time coordinating discharge: 60 minutes  SIGNED:  Vernell Leep, MD, FACP, [facility]. [facility] Pager [phone]  If 7PM-7AM, please contact night-coverage [email or web address] Password [facility] 07/24/2018, 12:35 PM

## 2018-07-24 NOTE — Clinical Social Work Note (Signed)
CSW facilitated patient discharge including contacting patient family and facility to confirm patient discharge plans. Clinical information faxed to facility and family agreeable with plan. CSW arranged ambulance transport via [facility] to [facility] at 3:00 pm. RN to call report prior to discharge [phone] Room [phone]).  CSW will sign off for now as social work intervention is no longer needed. Please consult [place] again if new needs arise.  Dayton Scrape, [place]

## 2018-07-24 NOTE — Clinical Social Work Placement (Signed)
   CLINICAL SOCIAL WORK PLACEMENT  NOTE  Date:  07/24/2018  Patient Details  Name: Chad Mcmillan MRN: [id number] Date of Birth: November 03, 1924  Clinical Social Work is seeking post-discharge placement for this patient at the [place] level of care (*CSW will initial, date and re-position this form in  chart as items are completed):  Yes   Patient/family provided with [place] Work Department's list of facilities offering this level of care within the geographic area requested by the patient (or if unable, by the patient's family).  Yes   Patient/family informed of their freedom to choose among providers that offer the needed level of care, that participate in Medicare, Medicaid or managed care program needed by the patient, have an available bed and are willing to accept the patient.  Yes   Patient/family informed of [facility]'s ownership interest in [facility] and [facility], as well as of the fact that they are under no obligation to receive care at these facilities.  PASRR submitted to EDS on 07/21/18     PASRR number received on       Existing PASRR number confirmed on 07/21/18     FL2 transmitted to all facilities in geographic area requested by pt/family on 07/21/18     FL2 transmitted to all facilities within larger geographic area on       Patient informed that his/her managed care company has contracts with or will negotiate with certain facilities, including the following:        Yes   Patient/family informed of bed offers received.  Patient chooses bed at [facility]     Physician recommends and patient chooses bed at      Patient to be transferred to [facility] on 07/24/18.  Patient to be transferred to facility by [facility]     Patient family notified on 07/24/18 of transfer.  Name of family member notified:  Midge Aver, and Rogelia Rohrer     PHYSICIAN Please sign FL2, Please prepare  prescriptions     Additional Comment:    _______________________________________________ Candie Chroman, LCSW 07/24/2018, 1:07 PM

## 2018-07-24 NOTE — Progress Notes (Signed)
Inpatient Diabetes Program Recommendations  AACE/ADA: New Consensus Statement on Inpatient Glycemic Control (2019)  Target Ranges:  Prepandial:   less than 140 mg/dL      Peak postprandial:   less than 180 mg/dL (1-2 hours)      Critically ill patients:  140 - 180 mg/dL   Results for GLEB, MCGUIRE (MRN [id number]) as of 07/24/2018 11:39  Ref. Range 07/23/2018 08:10 07/23/2018 12:32 07/23/2018 16:51 07/23/2018 21:08 07/24/2018 07:57  Glucose-Capillary Latest Ref Range: 70 - 99 mg/dL 178 (H) 238 (H) 231 (H) 289 (H) 161 (H)   Review of Glycemic Control  Diabetes history: DM2 Outpatient Diabetes medications: Prandin 0.5 mg TID with meals Current orders for Inpatient glycemic control: Novolog 0-9 units TID with meals, Novolog 0-5 units QHS  Inpatient Diabetes Program Recommendations: Insulin - Meal Coverage: Post prandial glucose is consistently elevated. Please consider ordering Novolog 2 units TID with meals for meal coverage if patient eats at least 50% of meals.  Thanks, Barnie Alderman, RN, MSN, CDE Diabetes Coordinator Inpatient Diabetes Program [phone] (Team Pager from 8am to 5pm)

## 2018-07-24 NOTE — Consult Note (Signed)
   [facility] CM Inpatient Consult   07/24/2018  Chad Mcmillan 17-Jun-1924 [id number]   Patient was assessed for [facility] for community services. Patient was previously active with [facility] with a [facility] Coordinator/Manager and CSW .  Met with patient at bedside regarding being restarted with [facility] services. Patient states he desires to go home and be with his wife but realizes he may needs some rehab. States, "I thought I was already getting rehab here." Explained that the therapist may have been working with him but he may be going for more therapy to get things better. He states well I have turned everything over to my sons Louie Casa and [place].  "They have full POA.  They are seeing after my 76 year old wife who we are trying to keep her home and all.  They are taking care of things but I really want to make it back to my wife and my home after rehab," per patient.  [Beginning to tear up].  Patient in agreement with [facility] follow up.  States, "but if there are big decisions to make my sons are still the ones making the big decisions."   [facility] will follow at the skilled facility to assist in transition of care needs for returning home.  Patient states his primary care provider is Dr. [name] but the chart indicates Dr. Billey Gosling has accepted the patient.  [facility] will follow for disposition.  Natividad Brood, RN BSN [facility] Liaison  [phone] business mobile phone Toll free office [phone]     Consent is active on file a brochure with [facility] information given.  Of note, [facility] services does not replace or interfere with any services that are arranged by inpatient case management or social work. For additional questions or referrals please contact:

## 2018-07-27 ENCOUNTER — Non-Acute Institutional Stay: Payer: Medicare Other | Admitting: Hospice and Palliative Medicine

## 2018-07-27 DIAGNOSIS — Z515 Encounter for palliative care: Secondary | ICD-10-CM

## 2018-07-27 NOTE — Progress Notes (Signed)
PALLIATIVE CARE CONSULT VISIT   PATIENT NAME: Chad Mcmillan DOB: 04-Feb-1924 MRN: [id number]  PRIMARY CARE PROVIDER:   Binnie Rail, MD  REFERRING PROVIDER:  Avis Epley  RESPONSIBLE PARTY:   Son Jori Moll  ASSESSMENT:     Patient mostly oriented and able to engage briefly in conversation. He is comfortable appearing and has no acute complaints. He has some pain with movement but denies pain currently. Patient is working with PT. His baseline functional status is unknown. However, patient reports that prior to this recent hospitalization, he was living at home with his wife and was independent with ADLs. He says that his wife is in poor health and he was caring for her with assistance from his two sons.   Patient's primary goal seems to be to return home following rehab. It is not clear if he will return to his previous functional baseline. I have tried calling both of patient's sons without success. Will try to reach them to clarify goals.   RECOMMENDATIONS and PLAN:  1. Continue supportive care 2. Will try to reach family   I spent 30 minutes providing this consultation,  from 1200 to 1230. More than 50% of the time in this consultation was spent coordinating communication.   HISTORY OF PRESENT ILLNESS:  Chad Mcmillan is a 84 y.o. year old male with multiple medical problems including dementia, h/o CVA, CKD IV, HTN, HLD, who was recently hospitalized 07/17/18 to 07/24/18 with a Right femur fracture following a fall at home. He underwent surgical repair. Hospitalization was complicated by acute respiratory failure secondary to CHF, anemia, and new onset afib. Patient was seen in consultation by palliative care and we are asked to follow at rehab.   CODE STATUS: FC  PPS: 40% HOSPICE ELIGIBILITY/DIAGNOSIS: TBD  PAST MEDICAL HISTORY:  Past Medical History:  Diagnosis Date  . ADENOCARCINOMA, PROSTATE 06/01/2008  . CORONARY ARTERY DISEASE 06/13/2007  . CVA 06/01/2008   x 2  . DIABETES  MELLITUS, TYPE I, WITH OPHTHALMIC COMPLICATIONS 03/06/3789  . 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 HYPERKALEMIA 06/01/2008   due to DM neparopathy  . HYPERLIPIDEMIA 06/13/2007  . HYPERTENSION 06/13/2007  . NEOPLASM, MALIGNANT, SKIN, FACE 06/01/2008  . Other chronic nonalcoholic liver disease 2/40/9735  . RENAL DISEASE 06/01/2008    SOCIAL HX:  Social History   Tobacco Use  . Smoking status: Former Smoker    Last attempt to quit: 11/18/1978    Years since quitting: 39.7  . Smokeless tobacco: Never Used  Substance Use Topics  . Alcohol use: No    ALLERGIES: No Known Allergies   PERTINENT MEDICATIONS:  Outpatient Encounter Medications as of 07/27/2018  Medication Sig  . amLODipine (NORVASC) 10 MG tablet Take 1 tablet (10 mg total) by mouth daily.  Marland Kitchen aspirin EC 81 MG tablet Take 1 tablet (81 mg total) by mouth daily.  Marland Kitchen atorvastatin (LIPITOR) 40 MG tablet Take 2 tablets (80 mg total) by mouth daily at 6 PM.  . calcium-vitamin D (OSCAL WITH D) 500-200 MG-UNIT tablet Take 1 tablet by mouth 3 (three) times daily.  . carvedilol (COREG) 25 MG tablet Take 1 tablet (25 mg total) by mouth 2 (two) times daily with a meal.  . docusate sodium (COLACE) 100 MG capsule Take 1 capsule (100 mg total) by mouth 2 (two) times daily.  Marland Kitchen donepezil (ARICEPT) 5 MG tablet Take 1 tablet (5 mg total) by mouth at bedtime.  . feeding supplement, [place], ([place])  LIQD Take 237 mLs by mouth at bedtime.  . ferrous sulfate 325 (65 FE) MG tablet Take 1 tablet (325 mg total) by mouth 2 (two) times daily with a meal.  . HYDROcodone-acetaminophen (NORCO) 7.5-325 MG tablet Take 1-2 tablets by mouth every 6 (six) hours as needed for moderate pain.  Marland Kitchen insulin aspart (NOVOLOG) 100 UNIT/ML injection Inject 0-9 Units into the skin 3 (three) times daily with meals. Correction coverage: Sensitive (thin, NPO, renal) CBG < 70: implement hypoglycemia protocol CBG 70 - 120: 0 units CBG 121 - 150: 1 unit CBG 151 - 200: 2 units CBG  201 - 250: 3 units CBG 251 - 300: 5 units CBG 301 - 350: 7 units CBG 351 - 400: 9 units CBG > 400: call MD.  . meclizine (ANTIVERT) 12.5 MG tablet TAKE 1 TABLET BY MOUTH THREE TIMES DAILY AS NEEDED FOR DIZZINESS (Patient taking differently: Take 12.5 mg by mouth 3 (three) times daily as needed for dizziness. )  . nitroGLYCERIN (NITROSTAT) 0.4 MG SL tablet Place 1 tablet (0.4 mg total) under the tongue every 5 (five) minutes as needed. For chest pain  . polyethylene glycol (MIRALAX / GLYCOLAX) packet Take 17 g by mouth daily.  . repaglinide (PRANDIN) 0.5 MG tablet Take 1 tablet (0.5 mg total) by mouth 3 (three) times daily before meals.  . senna-docusate (SENOKOT-S) 8.6-50 MG tablet Take 1 tablet by mouth at bedtime as needed for mild constipation or moderate constipation.  . vitamin B-12 1000 MCG tablet Take 1 tablet (1,000 mcg total) by mouth daily.   No facility-administered encounter medications on file as of 07/27/2018.     PHYSICAL EXAM:   General: NAD, frail appearing, thin, in wheelchair Cardiovascular: regular rate Pulmonary: clear ant fields Abdomen: soft, nontender, + bowel sounds GU: no suprapubic tenderness Extremities: 3+ edema BLEs Skin: no rashes Neurological: Weakness but otherwise nonfocal  Irean Hong, NP

## 2018-07-29 ENCOUNTER — Non-Acute Institutional Stay: Payer: Medicare Other | Admitting: *Deleted

## 2018-07-29 ENCOUNTER — Telehealth (INDEPENDENT_AMBULATORY_CARE_PROVIDER_SITE_OTHER): Payer: Self-pay | Admitting: Orthopaedic Surgery

## 2018-07-29 DIAGNOSIS — Z515 Encounter for palliative care: Secondary | ICD-10-CM

## 2018-07-29 NOTE — Telephone Encounter (Signed)
See message.

## 2018-07-29 NOTE — Telephone Encounter (Signed)
Platform weight bearing is fine

## 2018-07-29 NOTE — Telephone Encounter (Signed)
Daryl (PT) from [facility] called needing to confirm that the patient is non-weight bearing for left shoulder. The number to contact Daryl is [phone]

## 2018-07-30 ENCOUNTER — Telehealth (INDEPENDENT_AMBULATORY_CARE_PROVIDER_SITE_OTHER): Payer: Self-pay | Admitting: Orthopaedic Surgery

## 2018-07-30 NOTE — Telephone Encounter (Signed)
See other msg

## 2018-07-30 NOTE — Telephone Encounter (Signed)
Called and spoke to [place] advise only Platform [employer]

## 2018-07-30 NOTE — Telephone Encounter (Signed)
Called Chad Mcmillan no answer LMOM to return call. Will try again later.

## 2018-07-30 NOTE — Telephone Encounter (Signed)
Daryl from [facility] call left message to return call concerning weight bearing status. The number to contact patient is [phone]

## 2018-07-30 NOTE — Progress Notes (Signed)
COMMUNITY PALLIATIVE CARE RN NOTE  PATIENT NAME: KYLON PHILBROOK DOB: 1924-04-17 MRN: [id number]  PRIMARY CARE PROVIDER: Binnie Rail, MD  RESPONSIBLE PARTY:  Acct ID - Guarantor Home Phone Work Phone Relationship Acct Type  [id number] - GIBRAN, VESELKA [phone]  Self P/F     [address], [place], [place] [address]    PLAN OF CARE and INTERVENTION:  1. ADVANCE CARE PLANNING/GOALS OF CARE: MOST form: Attempt CPR, Full Scope of Treatment, ABT/IVFs is indicated 2. PATIENT/CAREGIVER EDUCATION: Reinforced Safety/Fall Precautions, Pain Management 3. DISEASE STATUS: Upon arrival, patient sitting up in wheelchair in his room asleep. Wife present during visit. Easily arousable with verbal stimulation. He is very pleasant and engaging, able to answer questions and make needs known. Some forgetfulness. He denies pain at present while sitting, but does experience pain in his R hip and L shoulder with movement. Norco is effective. He requires 1 person assistance with bathing, dressing, transfers and ambulation. He is currently working with PT/OT and ST. With PT, he was able to take a few steps using his walker today. He feels that he is making progress. He has a good appetite, eating 100% of 3 meals per day. Currently on a mechanical soft diet with thin liquids. Denies dysphagia. Takes all medications without difficulty. Will continue to monitor patient condition.   HISTORY OF PRESENT ILLNESS:  This is a 84 yo male who was recently admitted to [facility] for Rehab. He was recently hospitalized from 07/17/18-07/24/18 d/t R hip fracture w/repair, after a recent fall at home. Palliative Care Team to follow.   CODE STATUS: FULL CODE  ADVANCED DIRECTIVES: Y (HCPOA,Living Will) MOST FORM: yes PPS: 40%   PHYSICAL EXAM:   LUNGS: clear to auscultation  CARDIAC: Cor RRR EXTREMITIES: 3+ edema bilateral lower extremities SKIN: Thin/frail skin  NEURO: Alert and oriented x3, pleasant mood, forgetful,  ambulatory short distances with assistance/walker   (Duration of visit and documentation 60 minutes)    Daryl Eastern, RN, BSN

## 2018-08-04 ENCOUNTER — Ambulatory Visit (INDEPENDENT_AMBULATORY_CARE_PROVIDER_SITE_OTHER): Payer: No Typology Code available for payment source | Admitting: Physician Assistant

## 2018-08-04 ENCOUNTER — Inpatient Hospital Stay (INDEPENDENT_AMBULATORY_CARE_PROVIDER_SITE_OTHER): Payer: Medicare Other | Admitting: Orthopaedic Surgery

## 2018-08-04 ENCOUNTER — Encounter (INDEPENDENT_AMBULATORY_CARE_PROVIDER_SITE_OTHER): Payer: Self-pay | Admitting: Orthopaedic Surgery

## 2018-08-04 ENCOUNTER — Ambulatory Visit (INDEPENDENT_AMBULATORY_CARE_PROVIDER_SITE_OTHER): Payer: No Typology Code available for payment source

## 2018-08-04 DIAGNOSIS — S72111A Displaced fracture of greater trochanter of right femur, initial encounter for closed fracture: Secondary | ICD-10-CM

## 2018-08-04 NOTE — Progress Notes (Signed)
Post-Op Visit Note   Patient: Chad Mcmillan           Date of Birth: May 28, 1924           MRN: [id number] Visit Date: 08/04/2018 PCP: Binnie Rail, MD   Assessment & Plan:  Chief Complaint:  Chief Complaint  Patient presents with  . Right Leg - Routine Post Op   Visit Diagnoses:  1. Displaced fracture of greater trochanter of right femur, initial encounter for closed fracture [facility])     Plan: Patient is a pleasant 84 year old gentleman who presents to our clinic today 18 days status post right hip IM nail, date of surgery 07/17/2018.  He has been at [facility] where he is getting physical therapy.  Minimal pain.  Overall, doing well.  He does note a superficial blister to his right heel.  No signs of infection.  Examination of his right hip reveals a well-healing surgical incision with staples intact.  These were removed today and Steri-Strips applied.  Examination of his right heel reveals a quarter size superficial blister without evidence of infection or cellulitis.  We dressed this with mupirocin and dry bandage today.  The patient has been instructed to put a pillow under his leg when elevating and to keep pressure off of his right heel.  He will continue weightbearing as tolerated.  He will follow-up with [place] in 4 weeks time for repeat evaluation and x-ray.  Follow-Up Instructions: Return in about 4 weeks (around 09/01/2018).   Orders:  Orders Placed This Encounter  Procedures  . XR FEMUR, MIN 2 VIEWS RIGHT   No orders of the defined types were placed in this encounter.   Imaging: Xr Femur, Min 2 Views Right  Result Date: 08/04/2018 X-rays of the right femur reveal a well aligned femoral nail without evidence of collapse of the femoral head.  No signs of fracture healing at this time   PMFS History: Patient Active Problem List   Diagnosis Date Noted  . Paroxysmal atrial fibrillation with RVR ([place])   . Acute diastolic heart failure ([place])   . S/P CABG (coronary artery  bypass graft)   . Respiratory failure with hypoxia ([place]) 07/19/2018  . Closed fracture of acromial end of left clavicle 07/19/2018    Class: Acute  . Closed displaced fracture of greater trochanter of right femur ([place]) 07/17/2018  . History of thrombocytopenia 07/17/2018  . Displaced fracture of greater trochanter of right femur, initial encounter for closed fracture ([place]) 07/17/2018  . Pleural effusion, left 07/17/2018  . Abnormal finding on chest xray 07/17/2018  . CAP (community acquired pneumonia) 07/17/2018  . Hypocalcemia 02/12/2018  . Agitation   . Dementia   . Palliative care by specialist   . CKD (chronic kidney disease), stage IV ([place]) 01/14/2018  . HAP (hospital-acquired pneumonia) 01/12/2018  . AKI (acute kidney injury) ([place]) 01/12/2018  . Inguinal hernia of right side without obstruction or gangrene 01/12/2018  . Vertigo 01/07/2018  . LBBB (left bundle branch block) 01/07/2018  . Generalized weakness 01/07/2018  . Dehydration   . Foot pain, bilateral 10/07/2017  . Memory loss 04/07/2017  . Numbness 03/06/2016  . Dizzy 01/31/2016  . Medication side effect 03/08/2015  . Pancytopenia ([place]) 07/27/2012  . Goals of care, counseling/discussion 07/27/2012  . DM (diabetes mellitus) ([place]) 07/27/2012  . Anemia 07/27/2012  . Encounter for long-term (current) use of other medications 07/23/2011  . NASH (nonalcoholic steatohepatitis) 07/17/2010  . COUGH 02/06/2010  . PNEUMONIA, ORGANISM UNSPECIFIED  11/27/2009  . NEOPLASM, MALIGNANT, SKIN, FACE 06/01/2008  . ADENOCARCINOMA, PROSTATE 06/01/2008  . HYPERKALEMIA 06/01/2008  . History of stroke 06/01/2008  . Disorder of kidney and ureter 06/01/2008  . NEOPLASM, MALIGNANT, SKIN, FACE 06/01/2008  . Dyslipidemia 06/13/2007  . Essential hypertension 06/13/2007  . Coronary atherosclerosis 06/13/2007   Past Medical History:  Diagnosis Date  . ADENOCARCINOMA, PROSTATE 06/01/2008  . CORONARY ARTERY DISEASE 06/13/2007  . CVA 06/01/2008    x 2  . DIABETES MELLITUS, TYPE I, WITH OPHTHALMIC COMPLICATIONS 03/28/210  . DM retinopathy ([place])   . HYPERKALEMIA 06/01/2008   due to DM neparopathy  . HYPERLIPIDEMIA 06/13/2007  . HYPERTENSION 06/13/2007  . NEOPLASM, MALIGNANT, SKIN, FACE 06/01/2008  . Other chronic nonalcoholic liver disease 1/73/5670  . RENAL DISEASE 06/01/2008    Family History  Problem Relation Age of Onset  . Vision loss Son     Past Surgical History:  Procedure Laterality Date  . Carotid Duplex  09/12/2004  . CATARACT EXTRACTION    . CHOLECYSTECTOMY    . ESOPHAGOGASTRODUODENOSCOPY  06/28/1991  . FEMUR IM NAIL Right 07/17/2018  . HERNIA REPAIR     right Inguinal hernia  . INTRAMEDULLARY (IM) NAIL INTERTROCHANTERIC N/A 07/17/2018   Procedure: INTRAMEDULLARY (IM) NAIL INTERTROCHANTRIC;  Surgeon: Leandrew Koyanagi, MD;  Location: [facility];  Service: Orthopedics;  Laterality: N/A;  . Rest/Stress Cardiolite  01/22/2001   Social History   Occupational History  . Occupation: Retired    Fish farm manager: RETIRED  Tobacco Use  . Smoking status: Former Smoker    Last attempt to quit: 11/18/1978    Years since quitting: 39.7  . Smokeless tobacco: Never Used  Substance and Sexual Activity  . Alcohol use: No  . Drug use: No  . Sexual activity: Not Currently

## 2018-08-05 ENCOUNTER — Other Ambulatory Visit: Payer: Self-pay | Admitting: *Deleted

## 2018-08-05 NOTE — Patient Outreach (Signed)
[facility]) Care Management  08/05/2018  JEFFRY VOGELSANG [birth year] [id number]  IDT meeting onsite at [facility] Patient making some progress, having some blood pressure issues. Unsure of of discharge at this time.    Patient previously active with [facility] care management.  Son, [name], wanted any communication to go through him. Attempted to visit with patient or family Patient is asleep, no family in room.  Plan to follow up at next facility visit or contact son as indicated.  Will collaborate with [facility].  Will continue to monitor for [facility] care management needs.   Royetta Crochet. Laymond Purser, RN, BSN, [place] [phone]) Business Cell  [phone]) Toll Free Office

## 2018-08-06 ENCOUNTER — Telehealth (INDEPENDENT_AMBULATORY_CARE_PROVIDER_SITE_OTHER): Payer: Self-pay | Admitting: Orthopaedic Surgery

## 2018-08-06 NOTE — Telephone Encounter (Signed)
WBAT

## 2018-08-06 NOTE — Telephone Encounter (Signed)
Daryl, PT at [facility] is needing an updated weight bearing status for patients upper left extremity/clavicle. Please give her a call back # [phone]

## 2018-08-06 NOTE — Telephone Encounter (Signed)
Please advise 

## 2018-08-06 NOTE — Telephone Encounter (Signed)
Called Daryl no answer. LMOM with details.

## 2018-08-07 ENCOUNTER — Encounter: Payer: Self-pay | Admitting: Cardiology

## 2018-08-07 ENCOUNTER — Ambulatory Visit (INDEPENDENT_AMBULATORY_CARE_PROVIDER_SITE_OTHER): Payer: Medicare Other | Admitting: Cardiology

## 2018-08-07 ENCOUNTER — Telehealth: Payer: Self-pay | Admitting: Internal Medicine

## 2018-08-07 VITALS — BP 134/48 | HR 57 | Ht 72.0 in

## 2018-08-07 DIAGNOSIS — E785 Hyperlipidemia, unspecified: Secondary | ICD-10-CM

## 2018-08-07 DIAGNOSIS — N184 Chronic kidney disease, stage 4 (severe): Secondary | ICD-10-CM | POA: Diagnosis not present

## 2018-08-07 DIAGNOSIS — I2510 Atherosclerotic heart disease of native coronary artery without angina pectoris: Secondary | ICD-10-CM | POA: Diagnosis not present

## 2018-08-07 DIAGNOSIS — I10 Essential (primary) hypertension: Secondary | ICD-10-CM | POA: Diagnosis not present

## 2018-08-07 DIAGNOSIS — D649 Anemia, unspecified: Secondary | ICD-10-CM

## 2018-08-07 DIAGNOSIS — I480 Paroxysmal atrial fibrillation: Secondary | ICD-10-CM

## 2018-08-07 LAB — BASIC METABOLIC PANEL
BUN/Creatinine Ratio: 34 — ABNORMAL HIGH (ref 10–24)
BUN: 71 mg/dL — ABNORMAL HIGH (ref 10–36)
CO2: 19 mmol/L — AB (ref 20–29)
CREATININE: 2.07 mg/dL — AB (ref 0.76–1.27)
Calcium: 8.8 mg/dL (ref 8.6–10.2)
Chloride: 111 mmol/L — ABNORMAL HIGH (ref 96–106)
GFR calc Af Amer: 31 mL/min/{1.73_m2} — ABNORMAL LOW (ref >59)
GFR calc non Af Amer: 27 mL/min/{1.73_m2} — ABNORMAL LOW (ref >59)
GLUCOSE: 190 mg/dL — AB (ref 65–99)
Potassium: 5.8 mmol/L (ref 3.5–5.2)
SODIUM: 141 mmol/L (ref 134–144)

## 2018-08-07 LAB — CBC
Hematocrit: 24.3 % — ABNORMAL LOW (ref 37.5–51.0)
Hemoglobin: 7.8 g/dL — ABNORMAL LOW (ref 13.0–17.7)
MCH: 30.7 pg (ref 26.6–33.0)
MCHC: 32.1 g/dL (ref 31.5–35.7)
MCV: 96 fL (ref 79–97)
PLATELETS: 116 10*3/uL — AB (ref 150–450)
RBC: 2.54 x10E6/uL — AB (ref 4.14–5.80)
RDW: 14 % (ref 12.3–15.4)
WBC: 6.4 10*3/uL (ref 3.4–10.8)

## 2018-08-07 NOTE — Patient Instructions (Signed)
Medication Instructions: Your physician recommends that you continue on your current medications as directed. Please refer to the Current Medication list given to you today.   Labwork: TODAY: BMET,CBC  Procedures/Testing: Your physician has ordered for you to wear a long term monitor for 2 weeks   Follow-Up: Your physician recommends that you schedule a follow-up appointment in: 2-3 months with [name]   Any Additional Special Instructions Will Be Listed Below (If Applicable).   Peripheral Edema Peripheral edema is swelling that is caused by a buildup of fluid. Peripheral edema most often affects the lower legs, ankles, and feet. It can also develop in the arms, hands, and face. The area of the body that has peripheral edema will look swollen. It may also feel heavy or warm. Your clothes may start to feel tight. Pressing on the area may make a temporary dent in your skin. You may not be able to move your arm or leg as much as usual. There are many causes of peripheral edema. It can be a complication of other diseases, such as congestive heart failure, kidney disease, or a problem with your blood circulation. It also can be a side effect of certain medicines. It often happens to women during pregnancy. Sometimes, the cause is not known. Treating the underlying condition is often the only treatment for peripheral edema. Follow these instructions at home: Pay attention to any changes in your symptoms. Take these actions to help with your discomfort:  Raise (elevate) your legs while you are sitting or lying down.  Move around often to prevent stiffness and to lessen swelling. Do not sit or stand for long periods of time.  Wear support stockings as told by your health care provider.  Follow instructions from your health care provider about limiting salt (sodium) in your diet. Sometimes eating less salt can reduce swelling.  Take over-the-counter and prescription medicines only as told by  your health care provider. Your health care provider may prescribe medicine to help your body get rid of excess water (diuretic).  Keep all follow-up visits as told by your health care provider. This is important.  Contact a health care provider if:  You have a fever.  Your edema starts suddenly or is getting worse, especially if you are pregnant or have a medical condition.  You have swelling in only one leg.  You have increased swelling and pain in your legs. Get help right away if:  You develop shortness of breath, especially when you are lying down.  You have pain in your chest or abdomen.  You feel weak.  You faint. This information is not intended to replace advice given to you by your health care provider. Make sure you discuss any questions you have with your health care provider. Document Released: 12/12/2004 Document Revised: 04/08/2016 Document Reviewed: 05/17/2015 Elsevier Interactive Patient Education  [employer].     If you need a refill on your cardiac medications before your next appointment, please call your pharmacy.

## 2018-08-07 NOTE — Telephone Encounter (Signed)
K+ 5.8. I called and spoke to the nurse at [facility]. The patient is not on any potassium supplements. I have ordered Kayexelate 30 gm and repeat BMet in the morning. No orange juice or bananas etc tonight.  I will call the facility for results tomorrow.

## 2018-08-07 NOTE — Telephone Encounter (Signed)
New Message:     Chad Mcmillan is calling with critical lab results  Potassium is 5.8 RBC 2.54

## 2018-08-07 NOTE — Progress Notes (Signed)
Cardiology Office Note:    Date:  08/07/2018   ID:  Chad Mcmillan, DOB 05-01-1924, MRN [id number]  PCP:  Binnie Rail, MD  Cardiologist:  Dorris Carnes, MD  Referring MD: [name], MD   Chief Complaint  Patient presents with  . Hospitalization Follow-up    AFib, CHF    History of Present Illness:    Chad Mcmillan is a 84 y.o. male with a past medical history significant for CAD with remote CABG, diabetes mellitus, chronic kidney disease stage IV complicated by anemia, hypertension, mild thrombocytopenia and early dementia.  He was hospitalized for a fall on August 30 and underwent surgery for a intertrochanteric fracture of the right femur.  Postoperatively, he received a blood transfusion, developed worsening lung infiltrates and worsening right pleural effusion complicated by hypoxemia, improved after treatment with diuretics.  His most recent echocardiogram performed earlier this year shows normal left ventricular systolic function with limited regional wall motion abnormalities. There was mild LAE. On presentation to the hospital his rhythm was normal sinus with first-degree AV block.  Post-Operatively he went into atrial fibrillation with rapid ventricular response.  Rate control was easily achieved with medications.  His last echo shows mild left atrial dilatation.  There are no serious valvular abnormalities. He has a reported history of stroke.    He was also treated for acute on diastolic CHF.  Briefly on IV Lasix efficiency.  He had a -6 L volume deficit.  He appeared compensated at discharge.  At discharge he was in sinus rhythm with Carvedilol 25 mg twice daily. He was briefly on Eliquis but discontinued by cardiology 9/4 due to progressively worsening anemia despite no reported major bleeding and thrombocytopenia.It was noted that the patient may have had chronic anemia related to chronic kidney disease and his hemoglobin may have been in the mid 8 grams range PTA which  subsequently dropped to the 7 range, some of which may have been due to his fracture and perioperative blood loss. No overt bleeding noted. Patient is at high risk for bleeding complications if on Eliquis due to his advanced age, renal insufficiency and fall risk. Per Dr. Algis Liming and Dr. Radford Pax,  They agreed that weighing risks versus benefits, it is best to hold off on anticoagulation at this time and continue prior home dose of aspirin 81 mg daily. Would revisit at follow up.   Chad Mcmillan was discharged to [facility] facility on 07/24/2018.  He is here today with his son for hospital follow-up. Chad Mcmillan says that he is feeling better than he has in quite a while.  He denies any chest discomfort, palpitations, shortness of breath, lightheadedness or syncope.  He does have significant pedal edema blister on the bottom of his right foot which is impeding his weightbearing on his leg, which is the leg that had the fracture.  He and his son say that he has chronic pedal edema.  He is participating in physical therapy at [facility] and seems to be making slow progress.  Past Medical History:  Diagnosis Date  . ADENOCARCINOMA, PROSTATE 06/01/2008  . CORONARY ARTERY DISEASE 06/13/2007  . CVA 06/01/2008   x 2  . DIABETES MELLITUS, TYPE I, WITH OPHTHALMIC COMPLICATIONS 01/07/2541  . 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 HYPERKALEMIA 06/01/2008   due to DM neparopathy  . HYPERLIPIDEMIA 06/13/2007  . HYPERTENSION 06/13/2007  . NEOPLASM, MALIGNANT, SKIN, FACE 06/01/2008  . Other chronic nonalcoholic liver disease 05/24/2375  . RENAL DISEASE 06/01/2008  Past Surgical History:  Procedure Laterality Date  . Carotid Duplex  09/12/2004  . CATARACT EXTRACTION    . CHOLECYSTECTOMY    . ESOPHAGOGASTRODUODENOSCOPY  06/28/1991  . FEMUR IM NAIL Right 07/17/2018  . HERNIA REPAIR     right Inguinal hernia  . INTRAMEDULLARY (IM) NAIL INTERTROCHANTERIC N/A 07/17/2018   Procedure: INTRAMEDULLARY (IM) NAIL INTERTROCHANTRIC;   Surgeon: Leandrew Koyanagi, MD;  Location: [place];  Service: Orthopedics;  Laterality: N/A;  . Rest/Stress Cardiolite  01/22/2001    Current Medications: Current Meds  Medication Sig  . amLODipine (NORVASC) 10 MG tablet Take 1 tablet (10 mg total) by mouth daily.  Marland Kitchen aspirin EC 81 MG tablet Take 1 tablet (81 mg total) by mouth daily.  Marland Kitchen atorvastatin (LIPITOR) 40 MG tablet Take 2 tablets (80 mg total) by mouth daily at 6 PM.  . calcium-vitamin D (OSCAL WITH D) 500-200 MG-UNIT tablet Take 1 tablet by mouth 3 (three) times daily.  . carvedilol (COREG) 25 MG tablet Take 1 tablet (25 mg total) by mouth 2 (two) times daily with a meal.  . docusate sodium (COLACE) 100 MG capsule Take 1 capsule (100 mg total) by mouth 2 (two) times daily.  Marland Kitchen donepezil (ARICEPT) 5 MG tablet Take 1 tablet (5 mg total) by mouth at bedtime.  . feeding supplement, GLUCERNA SHAKE, (GLUCERNA SHAKE) LIQD Take 237 mLs by mouth at bedtime.  . ferrous sulfate 325 (65 FE) MG tablet Take 1 tablet (325 mg total) by mouth 2 (two) times daily with a meal.  . HYDROcodone-acetaminophen (NORCO) 7.5-325 MG tablet Take 1-2 tablets by mouth every 6 (six) hours as needed for moderate pain.  . meclizine (ANTIVERT) 12.5 MG tablet TAKE 1 TABLET BY MOUTH THREE TIMES DAILY AS NEEDED FOR DIZZINESS (Patient taking differently: Take 12.5 mg by mouth 3 (three) times daily as needed for dizziness. )  . nitroGLYCERIN (NITROSTAT) 0.4 MG SL tablet Place 1 tablet (0.4 mg total) under the tongue every 5 (five) minutes as needed. For chest pain  . polyethylene glycol (MIRALAX / GLYCOLAX) packet Take 17 g by mouth daily.  . repaglinide (PRANDIN) 0.5 MG tablet Take 1 tablet (0.5 mg total) by mouth 3 (three) times daily before meals.  . senna-docusate (SENOKOT-S) 8.6-50 MG tablet Take 1 tablet by mouth at bedtime as needed for mild constipation or moderate constipation.  . vitamin B-12 1000 MCG tablet Take 1 tablet (1,000 mcg total) by mouth daily.     Allergies:    Patient has no known allergies.   Social History   Socioeconomic History  . Marital status: Married    Spouse name: Not on file  . Number of children: Not on file  . Years of education: Not on file  . Highest education level: Not on file  Occupational History  . Occupation: Retired    Fish farm manager: RETIRED  Social Needs  . Financial resource strain: Not on file  . Food insecurity:    Worry: Not on file    Inability: Not on file  . Transportation needs:    Medical: Not on file    Non-medical: Not on file  Tobacco Use  . Smoking status: Former Smoker    Last attempt to quit: 11/18/1978    Years since quitting: 39.7  . Smokeless tobacco: Never Used  Substance and Sexual Activity  . Alcohol use: No  . Drug use: No  . Sexual activity: Not Currently  Lifestyle  . Physical activity:    Days per week:  Not on file    Minutes per session: Not on file  . Stress: Not on file  Relationships  . Social connections:    Talks on phone: Not on file    Gets together: Not on file    Attends religious service: Not on file    Active member of club or organization: Not on file    Attends meetings of clubs or organizations: Not on file    Relationship status: Not on file  Other Topics Concern  . Not on file  Social History Narrative  . Not on file     Family History: The patient's family history includes Vision loss in his son. ROS:   Please see the history of present illness.     All other systems reviewed and are negative.  EKGs/Labs/Other Studies Reviewed:    The following studies were reviewed today:  Echo 07/21/2018 Study Conclusions - Left ventricle: The cavity size was normal. There was mild focal   basal hypertrophy of the septum. Systolic function was normal.   The estimated ejection fraction was in the range of 50% to 55%.   Wall motion was normal; there were no regional wall motion   abnormalities. Doppler parameters are consistent with abnormal   left ventricular  relaxation (grade 1 diastolic dysfunction). - Aortic valve: There was no significant regurgitation. - Mitral valve: There was trivial regurgitation. - Left atrium: The atrium was mildly to moderately dilated. - Right ventricle: Systolic function was normal. - Right atrium: Central venous pressure (est): 3 mm Hg. - Atrial septum: No defect or patent foramen ovale was identified. - Tricuspid valve: There was trivial regurgitation. - Pulmonic valve: There was no significant regurgitation. - Pericardium, extracardiac: There was a left pleural effusion.  Impressions: - Normal LV systolic function with grade 1 diasolic dysfunction. No   significant valvular disease. IVC collapses on respiration,   normal CVP.  Echo February 28, 2018 - Left ventricle: The cavity size was normal. There was mild concentric hypertrophy. Systolic function was at the lower limits of normal. The estimated ejection fraction was in the range of 50% to 55%. Possible mild hypokinesis of the basal-midanterolateral myocardium. There was fusion of early and atrial contributions to ventricular filling. The study is not technically sufficient to allow evaluation of LV diastolic function. - Aortic valve: Mild focal calcification involving the noncoronary cusp. Valve area (Vmax): 2.2 cm^2. - Mitral valve: There was mild regurgitation. - Left atrium: The atrium was mildly dilated. - Pulmonary arteries: Systolic pressure was mildly increased. PA peak pressure: 46 mm Hg (S).   EKG:  EKG is not ordered today.    Recent Labs: 01/13/2018: TSH 3.003 02/28/2018: ALT 16 03/02/2018: Magnesium 2.3 08/07/2018: BUN 71; Creatinine, Ser 2.07; Hemoglobin 7.8; Platelets 116; Potassium 5.8; Sodium 141   Recent Lipid Panel    Component Value Date/Time   CHOL 106 02/28/2018 0237   TRIG 88 02/28/2018 0237   TRIG 51 09/29/2006 0801   HDL 42 02/28/2018 0237   CHOLHDL 2.5 02/28/2018 0237   VLDL 18 02/28/2018 0237    LDLCALC 46 02/28/2018 0237    Physical Exam:    VS:  BP (!) 134/48   Pulse (!) 57   Ht 6' (1.829 m)   SpO2 98%   BMI 23.41 kg/m     Wt Readings from Last 3 Encounters:  07/24/18 172 lb 9.9 oz (78.3 kg)  06/15/18 187 lb 6.4 oz (85 kg)  04/15/18 176 lb 9.6 oz (80.1 kg)  Physical Exam  Constitutional: He is oriented to person, place, and time. He appears well-developed and well-nourished.  Frail elderly male, in good spirits and joking.  HENT:  Head: Normocephalic and atraumatic.  Neck: Normal range of motion. Neck supple. No JVD present.  Cardiovascular: Normal rate, regular rhythm and normal heart sounds. Exam reveals no gallop and no friction rub.  No murmur heard. Pulmonary/Chest: Effort normal and breath sounds normal. No respiratory distress. He has no wheezes. He has no rales.  Abdominal: Soft. Bowel sounds are normal.  Musculoskeletal: He exhibits edema.  1+ bilateral ankle edema  Neurological: He is alert and oriented to person, place, and time.  Skin: Skin is warm and dry.  Psychiatric: He has a normal mood and affect. His behavior is normal. Thought content normal.  Vitals reviewed.    ASSESSMENT:    1. Paroxysmal atrial fibrillation with RVR ([place])   2. Coronary artery disease involving native coronary artery of native heart without angina pectoris   3. Essential hypertension   4. CKD (chronic kidney disease), stage IV ([place])   5. Anemia, unspecified type   6. Dyslipidemia    PLAN:    In order of problems listed above:  Paroxysmal small atrial fibrillation  -In the postoperative setting  -Currently with regular rate and rhythm, no palpitations, chest discomfort, shortness of breath or lightheadedness -CHADSVasc 8 (age 3, CVA 2, DM, HTN, CHF, CAD), risk for embolic stroke.  Patient has a difficult situation.  He is known to have an unsteady gait falls for since he is raising his risk for bleeding complications.  HAS-BLED 4.  Hemoglobin in the hospital was  down into the 7 range also with thrombocytopenia.  Patient was put on hold.  -I will check an event monitor for 2 weeks.  If it shows significant A. fib burden at visit starting anticoagulation.  If no A. fib seen it may be more reasonable to hold off on anticoagulation time.  This was discussed with the patient and his son and they are in agreement.  CAD -History of remote CABG. On BB, statin, aspirin (stop if start anticoag) -Asymptomatic  Hypertension -amlodipine 10 mg, carvedilol 25 mg bid, lisinopril was stopped in the hospital due to acute on chronic CKD.  He also had hyperkalemia in the hospital and so would continue to leave off ACE inhibitor -Blood pressure well  CKD stage IV:  -Renal function worsened while in the hospital precipitated by aggressive diuresis. -We will check labs today  Anemia -Possibly anemia of chronic disease related to CKD and then some blood loss with surgery.  Hemoglobin down to 7.1, transfused 1 unit of packed red blood cells -We will check CBC today  Hyperlipidemia -On atorvastatin 40 mg, LDL 46 and 02/2018, at goal of <70.  Continue current therapy  Medication Adjustments/Labs and Tests Ordered: Current medicines are reviewed at length with the patient today.  Concerns regarding medicines are outlined above. Labs and tests ordered and medication changes are outlined in the patient instructions below:  Patient Instructions  Medication Instructions: Your physician recommends that you continue on your current medications as directed. Please refer to the Current Medication list given to you today.   Labwork: TODAY: BMET,CBC  Procedures/Testing: Your physician has ordered for you to wear a long term monitor for 2 weeks   Follow-Up: Your physician recommends that you schedule a follow-up appointment in: 2-3 months with [name]   Any Additional Special Instructions Will Be Listed Below (If Applicable).  Peripheral Edema Peripheral edema is  swelling that is caused by a buildup of fluid. Peripheral edema most often affects the lower legs, ankles, and feet. It can also develop in the arms, hands, and face. The area of the body that has peripheral edema will look swollen. It may also feel heavy or warm. Your clothes may start to feel tight. Pressing on the area may make a temporary dent in your skin. You may not be able to move your arm or leg as much as usual. There are many causes of peripheral edema. It can be a complication of other diseases, such as congestive heart failure, kidney disease, or a problem with your blood circulation. It also can be a side effect of certain medicines. It often happens to women during pregnancy. Sometimes, the cause is not known. Treating the underlying condition is often the only treatment for peripheral edema. Follow these instructions at home: Pay attention to any changes in your symptoms. Take these actions to help with your discomfort:  Raise (elevate) your legs while you are sitting or lying down.  Move around often to prevent stiffness and to lessen swelling. Do not sit or stand for long periods of time.  Wear support stockings as told by your health care provider.  Follow instructions from your health care provider about limiting salt (sodium) in your diet. Sometimes eating less salt can reduce swelling.  Take over-the-counter and prescription medicines only as told by your health care provider. Your health care provider may prescribe medicine to help your body get rid of excess water (diuretic).  Keep all follow-up visits as told by your health care provider. This is important.  Contact a health care provider if:  You have a fever.  Your edema starts suddenly or is getting worse, especially if you are pregnant or have a medical condition.  You have swelling in only one leg.  You have increased swelling and pain in your legs. Get help right away if:  You develop shortness of breath,  especially when you are lying down.  You have pain in your chest or abdomen.  You feel weak.  You faint. This information is not intended to replace advice given to you by your health care provider. Make sure you discuss any questions you have with your health care provider. Document Released: 12/12/2004 Document Revised: 04/08/2016 Document Reviewed: 05/17/2015 Elsevier Interactive Patient Education  [employer].     If you need a refill on your cardiac medications before your next appointment, please call your pharmacy.      Signed, Daune Perch, NP  08/07/2018 6:00 PM    [place]

## 2018-08-08 ENCOUNTER — Telehealth: Payer: Self-pay | Admitting: Cardiology

## 2018-08-08 NOTE — Telephone Encounter (Signed)
Per lab draw at [facility], K+ 5.2 today. Will recheck BMet at next lab draw on Monday.

## 2018-08-09 NOTE — Telephone Encounter (Signed)
Repeat BMET on Tuesday

## 2018-08-10 ENCOUNTER — Telehealth: Payer: Self-pay

## 2018-08-10 ENCOUNTER — Ambulatory Visit: Payer: Medicare Other | Admitting: Internal Medicine

## 2018-08-10 NOTE — Telephone Encounter (Signed)
Per telephone encounter 9/21--bmet was repeated 9/21/ and is due to be rechecked today.

## 2018-08-10 NOTE — Telephone Encounter (Signed)
Per Pecolia Ades, Repeat Bmet today stable at a 5.2 same as Saturday. No changes.

## 2018-08-12 ENCOUNTER — Other Ambulatory Visit: Payer: Self-pay | Admitting: *Deleted

## 2018-08-12 NOTE — Patient Outreach (Signed)
[facility]) Care Management  08/12/2018  Chad Mcmillan 03-Jun-1924 [id number]   Onsite IDT meeting.  Per therapy patient is about at baseline, they anticipate discharge early next week.  Patient will be discharging home with wife and son.   Attempted to meet with patient and family. Patient going to therapy, no family in room.  Plan to follow up with son [name] and see assess for any [facility] care management needs.  Patient active with [facility] care management in the past.  Royetta Crochet. Laymond Purser, RN, BSN, [place] [phone]) Business Cell  [phone]) Toll Free Office

## 2018-08-18 ENCOUNTER — Other Ambulatory Visit: Payer: Self-pay | Admitting: Cardiology

## 2018-08-18 ENCOUNTER — Ambulatory Visit (INDEPENDENT_AMBULATORY_CARE_PROVIDER_SITE_OTHER): Payer: Medicare Other

## 2018-08-18 DIAGNOSIS — I480 Paroxysmal atrial fibrillation: Secondary | ICD-10-CM

## 2018-08-19 ENCOUNTER — Non-Acute Institutional Stay: Payer: Medicare Other | Admitting: Licensed Clinical Social Worker

## 2018-08-19 DIAGNOSIS — Z515 Encounter for palliative care: Secondary | ICD-10-CM

## 2018-08-19 NOTE — Progress Notes (Signed)
COMMUNITY PALLIATIVE CARE SW NOTE  PATIENT NAME: Chad Mcmillan DOB: 06-22-1924 MRN: [id number]  PRIMARY CARE PROVIDER: Binnie Rail, MD  RESPONSIBLE PARTY:  Acct ID - Guarantor Home Phone Work Phone Relationship Acct Type  [id number] - Chad Mcmillan, Chad Mcmillan [phone]  Self P/F     [address], [address], [place] [address]     PLAN OF CARE and INTERVENTIONS:             1. GOALS OF CARE/ ADVANCE CARE PLANNING:  Patient wishes to return home.  He is a Full Code and has a MOST form. 2. SOCIAL/EMOTIONAL/SPIRITUAL ASSESSMENT/ INTERVENTIONS:  SW met with patient in his room at [facility].  Also consulted facility nurse, Nia.  She stated she was not aware of patient's discharge at this time.  Patient was in his w/c.  He denied pain, but stated his right foot hurts when he stands.  He appeared intermittently confused.  He said he has been married 66 years.  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  He also had some difficulty finding words during life review.  SW provided active listening and supportive counseling. 3. PATIENT/CAREGIVER EDUCATION/ COPING:  Patient copes by expressing his needs openly. 4. PERSONAL EMERGENCY PLAN:  Per facility protocol. 5. COMMUNITY RESOURCES COORDINATION/ HEALTH CARE NAVIGATION:  None. 6. FINANCIAL/LEGAL CONCERNS/INTERVENTIONS:  None.     SOCIAL HX:  Social History   Tobacco Use  . Smoking status: Former Smoker    Last attempt to quit: 11/18/1978    Years since quitting: 39.7  . Smokeless tobacco: Never Used  Substance Use Topics  . Alcohol use: No    CODE STATUS:  Full Code  ADVANCED DIRECTIVES:  HCPOA/Living Will MOST FORM COMPLETE:  Y HOSPICE EDUCATION PROVIDED:  N PPS:  Patient's intake is normal.  He is able to stand and ambulate with walker. Duration of visit and documentation:  45 minutes.      Creola Corn Stefon Ramthun, LCSW

## 2018-08-20 ENCOUNTER — Other Ambulatory Visit: Payer: Self-pay | Admitting: *Deleted

## 2018-08-20 NOTE — Patient Outreach (Signed)
[facility]) Care Management  08/20/2018  TRYSTON GILLIAM 11-05-1924 [id number]  Call to patient son, [name].  He reports he is out of town, and patient other son transported patient home from SNF today.  He reports patient is to get some home care at least for the foot wound, he is not sure if he will get PT/OT additionally. He reports patient has  A soft boot on right foot from some type of wound.   He states that he and his brother plan to get advice from an elder attorney and either get some hired help for home or seek some form of placement in the next few weeks. He knows that patient situation has changed and patient will need more services.   RNCM reviewed [facility] services with son, he feels that he wants contact information for future reference as they may need resources to assist with either private pay or placement.  He anticipates calling back in the next couple of weeks.  RNCM gave [facility] main office line in addition to [facility] contact for future reference. Explained that the [facility] care management service was available at any time as long as patient has same primary care MD and Medicare plan, he voiced understanding and appreciates the information.  Plan to sign off at this time.  Royetta Crochet. Laymond Purser, RN, BSN, [place] [phone]) Business Cell  [phone]) Toll Free Office

## 2018-08-21 ENCOUNTER — Telehealth: Payer: Self-pay | Admitting: Internal Medicine

## 2018-08-21 NOTE — Telephone Encounter (Signed)
Gave verbal orders per Dr. Burns 

## 2018-08-21 NOTE — Telephone Encounter (Signed)
Copied from [place] [id number]. Topic: General - Other >> Aug 21, 2018  1:49 PM Yvette Rack wrote: Reason for CRM: Nurse Pam from [facility] [phone] calling for verbal orders for  Home health aid 2 times  for 5 weeks for wound care of rt foot Skill nursing 1 time for 1 week 2 time for 4 weeks 1 time for 1 week 2 PRN  Home health aid for personal care services    to change dressing twice a week calcium Alignate

## 2018-08-25 ENCOUNTER — Telehealth: Payer: Self-pay | Admitting: Internal Medicine

## 2018-08-25 NOTE — Telephone Encounter (Signed)
REviewed with EP  Difficult to see P waves   May represent SB with first degree block Would pull back on carvedilol to 6.25 bid COntinue the monitor   Call if dizzy, has syncope Make appt with me or Buren Kos who has seen pt

## 2018-08-25 NOTE — Telephone Encounter (Signed)
New Message    Chad Mcmillan is calling from [employer] to provide a critical EKG.

## 2018-08-25 NOTE — Telephone Encounter (Signed)
[facility] called the patient and confirmed that he was asymptomatic and was sitting in his chair. He falls asleep frequently. Per Dr. Tamala Julian the patient is in Afib with slow VR. May need to cut back on Coreg. Dr. Tamala Julian requested [name] and Daune Perch, NP see this monitor.

## 2018-08-26 ENCOUNTER — Telehealth: Payer: Self-pay | Admitting: Internal Medicine

## 2018-08-26 MED ORDER — CARVEDILOL 6.25 MG PO TABS: 6.2500 mg | ORAL_TABLET | Freq: Two times a day (BID) | ORAL | 3 refills | Status: DC

## 2018-08-26 NOTE — Telephone Encounter (Addendum)
Received call from [facility] with [employer]. Patient had critical event 2nd degree type 1 heart block w/ PVC's 4 in 1 min, AVB 2:1 HR 25 to 30 on 08/26/18 at 8:17 CT. Called Patient had just finish eating breakfast and felt nauseated. Patient is now back in sinus rhythm HR 61. Patient is asymptomatic at this time.   Saw previous note from yesterday. Per [name], reviewed with EP Difficult to see P waves   May represent SB with first degree block Would pull back on carvedilol to 6.25 bid continue the monitor   Call if dizzy, has syncope Make appt with me or Buren Kos who has seen pt. Informed patient's son of changes and made an appointment next week with [name] on 09/01/18.  Reviewed monitor with DOD, Dr. Burt Knack. Monitor shows marked junctional bradycardia. Patient will need to stop carvedilol and keep his appointment next week with [name]. Took Carvedilol off patient's medication list. Called patient's son and let him know that patient needs to stop carvedilol all together. While on the phone patient's son informed us that patient is already taking carvedilol 6.25 mg. Patient's medication list reflected patient was on carvedilol 25 mg. Patient has not been taking this dose for a while now.  Also called patient's pharmacy and cancelled all prescriptions for carvedilol.

## 2018-08-26 NOTE — Telephone Encounter (Signed)
New Message        Chad Mcmillan is calling today  From  [facility] to report a adnormal EKG

## 2018-08-26 NOTE — Telephone Encounter (Signed)
Called patient's son with [name] recommendations. Patient's son verbalized understanding. Made patient an appointment for next week with [name].

## 2018-08-27 ENCOUNTER — Ambulatory Visit: Payer: Medicare Other | Admitting: Internal Medicine

## 2018-09-01 ENCOUNTER — Ambulatory Visit (INDEPENDENT_AMBULATORY_CARE_PROVIDER_SITE_OTHER): Payer: Medicare Other | Admitting: Orthopaedic Surgery

## 2018-09-01 ENCOUNTER — Ambulatory Visit (INDEPENDENT_AMBULATORY_CARE_PROVIDER_SITE_OTHER): Payer: Medicare Other

## 2018-09-01 ENCOUNTER — Encounter: Payer: Self-pay | Admitting: Internal Medicine

## 2018-09-01 ENCOUNTER — Ambulatory Visit (INDEPENDENT_AMBULATORY_CARE_PROVIDER_SITE_OTHER): Payer: Medicare Other | Admitting: Internal Medicine

## 2018-09-01 VITALS — BP 140/54 | HR 78 | Ht 72.0 in | Wt 185.0 lb

## 2018-09-01 DIAGNOSIS — I480 Paroxysmal atrial fibrillation: Secondary | ICD-10-CM

## 2018-09-01 DIAGNOSIS — S72111A Displaced fracture of greater trochanter of right femur, initial encounter for closed fracture: Secondary | ICD-10-CM | POA: Diagnosis not present

## 2018-09-01 DIAGNOSIS — I2510 Atherosclerotic heart disease of native coronary artery without angina pectoris: Secondary | ICD-10-CM

## 2018-09-01 MED ORDER — HYDROCODONE-ACETAMINOPHEN 5-325 MG PO TABS: 1.0000 | ORAL_TABLET | Freq: Every day | ORAL | 0 refills | Status: DC | PRN

## 2018-09-01 NOTE — Progress Notes (Signed)
Cardiology Office Note   Date:  09/01/2018   ID:  ASHUTOSH DIEGUEZ, DOB 09/13/1924, MRN [id number]  PCP:  Binnie Rail, MD  Cardiologist:   Dorris Carnes, MD   F/U of CAD      History of Present Illness: Chad Mcmillan is a 84 y.o. male with a history of CAD  Last cath in 2000with remote CABG. (LAD 50%, 70%, Diag 90% LCX 70 to 80%. LVEF 70. He had a myoview in Fall 2010. Also a histroy of CKD, anemia, HTN, mild thrombocytopenia and early dementia   I saw him in Jan 2019   He complained of unsteadness at the time   He was hosp in Aug 2019 for R femur fx    Underwent repair   Post op received blood transufsion.  Post op he also developed atrial fib with RVR     Treated with diuretcs Placed briefly on Eliquis bud d/c due to anemia and thrombocytopneia.     (Hgb has been in mid 8s prior to admit)   The pt was seen by Buren Kos in cardiology in September  Had signif pedal edema   At that visit he was set up for event monitor     The pt denies dizzines now   No CP   Does get SOB with minor activity   Some edema in legs       Outpatient Medications Prior to Visit  Medication Sig Dispense Refill  . amLODipine (NORVASC) 10 MG tablet Take 1 tablet (10 mg total) by mouth daily.    Marland Kitchen aspirin EC 81 MG tablet Take 1 tablet (81 mg total) by mouth daily. 90 tablet 3  . atorvastatin (LIPITOR) 40 MG tablet Take 2 tablets (80 mg total) by mouth daily at 6 PM. 60 tablet 4  . donepezil (ARICEPT) 5 MG tablet Take 1 tablet (5 mg total) by mouth at bedtime. 30 tablet 11  . HYDROcodone-acetaminophen (NORCO) 7.5-325 MG tablet Take 1-2 tablets by mouth every 6 (six) hours as needed for moderate pain. 30 tablet 0  . lisinopril (PRINIVIL,ZESTRIL) 5 MG tablet Take 5 mg by mouth daily.    Marland Kitchen LORazepam (ATIVAN) 0.5 MG tablet Take 0.5 mg by mouth at bedtime as needed for anxiety.    . meclizine (ANTIVERT) 12.5 MG tablet TAKE 1 TABLET BY MOUTH THREE TIMES DAILY AS NEEDED FOR DIZZINESS (Patient taking differently: Take  12.5 mg by mouth 3 (three) times daily as needed for dizziness. ) 30 tablet 3  . Multiple Vitamins-Minerals (DECUBI-VITE PO) Take 1 capsule by mouth daily.    . nitroGLYCERIN (NITROSTAT) 0.4 MG SL tablet Place 1 tablet (0.4 mg total) under the tongue every 5 (five) minutes as needed. For chest pain 25 tablet 3  . polyethylene glycol (MIRALAX / GLYCOLAX) packet Take 17 g by mouth daily.    . repaglinide (PRANDIN) 0.5 MG tablet Take 1 tablet (0.5 mg total) by mouth 3 (three) times daily before meals. 270 tablet 3  . senna-docusate (SENOKOT-S) 8.6-50 MG tablet Take 1 tablet by mouth at bedtime as needed for mild constipation or moderate constipation.    . sodium polystyrene (KAYEXALATE) powder Take 15 g by mouth once.    . vitamin B-12 1000 MCG tablet Take 1 tablet (1,000 mcg total) by mouth daily. 30 tablet 1  . calcium-vitamin D (OSCAL WITH D) 500-200 MG-UNIT tablet Take 1 tablet by mouth 3 (three) times daily. (Patient not taking: Reported on 09/01/2018) 90 tablet 12  .  docusate sodium (COLACE) 100 MG capsule Take 1 capsule (100 mg total) by mouth 2 (two) times daily. (Patient not taking: Reported on 09/01/2018)    . feeding supplement, GLUCERNA SHAKE, (GLUCERNA SHAKE) LIQD Take 237 mLs by mouth at bedtime. (Patient not taking: Reported on 09/01/2018)    . ferrous sulfate 325 (65 FE) MG tablet Take 1 tablet (325 mg total) by mouth 2 (two) times daily with a meal. (Patient not taking: Reported on 09/01/2018)     No facility-administered medications prior to visit.      Allergies:   Patient has no known allergies.   Past Medical History:  Diagnosis Date  . ADENOCARCINOMA, PROSTATE 06/01/2008  . CORONARY ARTERY DISEASE 06/13/2007  . CVA 06/01/2008   x 2  . DIABETES MELLITUS, TYPE I, WITH OPHTHALMIC COMPLICATIONS 05/01/4314  . 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 HYPERKALEMIA 06/01/2008   due to DM neparopathy  . HYPERLIPIDEMIA 06/13/2007  . HYPERTENSION 06/13/2007  . NEOPLASM, MALIGNANT, SKIN, FACE  06/01/2008  . Other chronic nonalcoholic liver disease 4/00/8676  . RENAL DISEASE 06/01/2008    Past Surgical History:  Procedure Laterality Date  . Carotid Duplex  09/12/2004  . CATARACT EXTRACTION    . CHOLECYSTECTOMY    . ESOPHAGOGASTRODUODENOSCOPY  06/28/1991  . FEMUR IM NAIL Right 07/17/2018  . HERNIA REPAIR     right Inguinal hernia  . INTRAMEDULLARY (IM) NAIL INTERTROCHANTERIC N/A 07/17/2018   Procedure: INTRAMEDULLARY (IM) NAIL INTERTROCHANTRIC;  Surgeon: Leandrew Koyanagi, MD;  Location: [facility];  Service: Orthopedics;  Laterality: N/A;  . Rest/Stress Cardiolite  01/22/2001     Social History:  The patient  reports that he quit smoking about 39 years ago. He has never used smokeless tobacco. He reports that he does not drink alcohol or use drugs.   Family History:  The patient's family history includes Vision loss in his son.    ROS:  Please see the history of present illness. All other systems are reviewed and  Negative to the above problem except as noted.    PHYSICAL EXAM: VS:  BP (!) 140/54   Pulse 78   Ht 6' (1.829 m)   Wt 185 lb (83.9 kg)   SpO2 99%   BMI 25.09 kg/m   GEN: Well nourished, well developed, in no acute distress    HEENT: normal  Neck: JVP elevated  , carotid bruits, or masses Cardiac: RRR; no murmurs, rubs, or gallops, 1+ edema  Respiratory:  clear to auscultation  GI: soft, nontender, nondistended, + BS  No hepatomegaly  Skin: warm and dry, no rash Neuro:  Deferred    EKG:  EKG is not ordered today.   Lipid Panel    Component Value Date/Time   CHOL 106 02/28/2018 0237   TRIG 88 02/28/2018 0237   TRIG 51 09/29/2006 0801   HDL 42 02/28/2018 0237   CHOLHDL 2.5 02/28/2018 0237   VLDL 18 02/28/2018 0237   LDLCALC 46 02/28/2018 0237      Wt Readings from Last 3 Encounters:  09/01/18 185 lb (83.9 kg)  07/24/18 172 lb 9.9 oz (78.3 kg)  06/15/18 187 lb 6.4 oz (85 kg)      ASSESSMENT AND PLAN:  1  CAD   I am not convinced of angina    He is SOB with activity   But, volume is up on exam Will check CBC, BNP and BMET  2 Hx of PAF   EKG is difficult    P waves are  low voltage   I do not see recurrance With anemia , without definite cause I would not recomm anticoagulation    3  HL Keep on pravastatin  4  HTN  BP is a little high  Follow   I am not eager to lower to pt of dizziness    Signed, Dorris Carnes, MD  09/01/2018 11:21 AM    [facility] [address], [place], [place]  [address] Phone: [phone]; Fax: [phone]

## 2018-09-01 NOTE — Progress Notes (Signed)
Post-Op Visit Note   Patient: Chad Mcmillan           Date of Birth: 10/28/1924           MRN: [id number] Visit Date: 09/01/2018 PCP: Binnie Rail, MD   Assessment & Plan:  Chief Complaint:  Chief Complaint  Patient presents with  . Right Leg - Routine Post Op   Visit Diagnoses:  1. Closed displaced fracture of greater trochanter of right femur, initial encounter ([place])     Plan: Kalev is 6 weeks status post intramedullary fixation of a nondisplaced intertrochanteric fracture.  Overall he is doing well.  He denies any pain.  He is complaining more about his heel wound.  His surgical incisions and scars of the right hip are fully healed.  He has no pain with hip movement.  Leg lengths are equal.  We will refer him to the wound clinic for management of the nonhealing right heel ulcer.  Follow-up in 6 weeks for right hip x-rays.  Follow-Up Instructions: Return in about 6 weeks (around 10/13/2018).   Orders:  Orders Placed This Encounter  Procedures  . XR FEMUR, MIN 2 VIEWS RIGHT   Meds ordered this encounter  Medications  . HYDROcodone-acetaminophen (NORCO) 5-325 MG tablet    Sig: Take 1 tablet by mouth daily as needed.    Dispense:  20 tablet    Refill:  0    Imaging: Xr Femur, Min 2 Views Right  Result Date: 09/01/2018 Stable fixation of inner troches fracture   PMFS History: Patient Active Problem List   Diagnosis Date Noted  . Paroxysmal atrial fibrillation with RVR ([place])   . Acute diastolic heart failure ([place])   . S/P CABG (coronary artery bypass graft)   . Respiratory failure with hypoxia ([place]) 07/19/2018  . Closed fracture of acromial end of left clavicle 07/19/2018    Class: Acute  . Closed displaced fracture of greater trochanter of right femur ([place]) 07/17/2018  . History of thrombocytopenia 07/17/2018  . Displaced fracture of greater trochanter of right femur, initial encounter for closed fracture ([place]) 07/17/2018  . Pleural effusion, left 07/17/2018  .  Abnormal finding on chest xray 07/17/2018  . CAP (community acquired pneumonia) 07/17/2018  . Hypocalcemia 02/12/2018  . Agitation   . Dementia ([place])   . Palliative care by specialist   . CKD (chronic kidney disease), stage IV ([place]) 01/14/2018  . HAP (hospital-acquired pneumonia) 01/12/2018  . AKI (acute kidney injury) ([place]) 01/12/2018  . Inguinal hernia of right side without obstruction or gangrene 01/12/2018  . Vertigo 01/07/2018  . LBBB (left bundle branch block) 01/07/2018  . Generalized weakness 01/07/2018  . Dehydration   . Foot pain, bilateral 10/07/2017  . Memory loss 04/07/2017  . Numbness 03/06/2016  . Dizzy 01/31/2016  . Medication side effect 03/08/2015  . Pancytopenia ([place]) 07/27/2012  . Goals of care, counseling/discussion 07/27/2012  . DM (diabetes mellitus) ([place]) 07/27/2012  . Anemia 07/27/2012  . Encounter for long-term (current) use of other medications 07/23/2011  . NASH (nonalcoholic steatohepatitis) 07/17/2010  . COUGH 02/06/2010  . PNEUMONIA, ORGANISM UNSPECIFIED 11/27/2009  . NEOPLASM, MALIGNANT, SKIN, FACE 06/01/2008  . ADENOCARCINOMA, PROSTATE 06/01/2008  . HYPERKALEMIA 06/01/2008  . History of stroke 06/01/2008  . Disorder of kidney and ureter 06/01/2008  . NEOPLASM, MALIGNANT, SKIN, FACE 06/01/2008  . Dyslipidemia 06/13/2007  . Essential hypertension 06/13/2007  . CAD (coronary artery disease) 06/13/2007   Past Medical History:  Diagnosis Date  . ADENOCARCINOMA,  PROSTATE 06/01/2008  . CORONARY ARTERY DISEASE 06/13/2007  . CVA 06/01/2008   x 2  . DIABETES MELLITUS, TYPE I, WITH OPHTHALMIC COMPLICATIONS 6/86/1683  . 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 HYPERKALEMIA 06/01/2008   due to DM neparopathy  . HYPERLIPIDEMIA 06/13/2007  . HYPERTENSION 06/13/2007  . NEOPLASM, MALIGNANT, SKIN, FACE 06/01/2008  . Other chronic nonalcoholic liver disease 06/15/210  . RENAL DISEASE 06/01/2008    Family History  Problem Relation Age of Onset  . Vision loss Son     Past  Surgical History:  Procedure Laterality Date  . Carotid Duplex  09/12/2004  . CATARACT EXTRACTION    . CHOLECYSTECTOMY    . ESOPHAGOGASTRODUODENOSCOPY  06/28/1991  . FEMUR IM NAIL Right 07/17/2018  . HERNIA REPAIR     right Inguinal hernia  . INTRAMEDULLARY (IM) NAIL INTERTROCHANTERIC N/A 07/17/2018   Procedure: INTRAMEDULLARY (IM) NAIL INTERTROCHANTRIC;  Surgeon: Leandrew Koyanagi, MD;  Location: [facility];  Service: Orthopedics;  Laterality: N/A;  . Rest/Stress Cardiolite  01/22/2001   Social History   Occupational History  . Occupation: Retired    Fish farm manager: RETIRED  Tobacco Use  . Smoking status: Former Smoker    Last attempt to quit: 11/18/1978    Years since quitting: 39.8  . Smokeless tobacco: Never Used  Substance and Sexual Activity  . Alcohol use: No  . Drug use: No  . Sexual activity: Not Currently

## 2018-09-01 NOTE — Patient Instructions (Signed)
Medication Instructions:  Your physician recommends that you continue on your current medications as directed. Please refer to the Current Medication list given to you today.  If you need a refill on your cardiac medications before your next appointment, please call your pharmacy.   Lab work: Today (BMET, CBC, TSH, BNP)  If you have labs (blood work) drawn today and your tests are completely normal, you will receive your results only by: Marland Kitchen MyChart Message (if you have MyChart) OR . A paper copy in the mail If you have any lab test that is abnormal or we need to change your treatment, we will call you to review the results.  Testing/Procedures: NONE  Follow-Up: Follow up with your physician will depend on test results.  Any Other Special Instructions Will Be Listed Below (If Applicable). NONE

## 2018-09-02 LAB — CBC
Hematocrit: 24 % — ABNORMAL LOW (ref 37.5–51.0)
Hemoglobin: 7.8 g/dL — ABNORMAL LOW (ref 13.0–17.7)
MCH: 32 pg (ref 26.6–33.0)
MCHC: 32.5 g/dL (ref 31.5–35.7)
MCV: 98 fL — ABNORMAL HIGH (ref 79–97)
Platelets: 117 10*3/uL — ABNORMAL LOW (ref 150–450)
RBC: 2.44 x10E6/uL — AB (ref 4.14–5.80)
RDW: 13.6 % (ref 12.3–15.4)
WBC: 5.5 10*3/uL (ref 3.4–10.8)

## 2018-09-02 LAB — PRO B NATRIURETIC PEPTIDE: NT-PRO BNP: 20074 pg/mL — AB (ref 0–486)

## 2018-09-02 LAB — BASIC METABOLIC PANEL
BUN/Creatinine Ratio: 18 (ref 10–24)
BUN: 36 mg/dL (ref 10–36)
CALCIUM: 8 mg/dL — AB (ref 8.6–10.2)
CO2: 20 mmol/L (ref 20–29)
CREATININE: 1.97 mg/dL — AB (ref 0.76–1.27)
Chloride: 110 mmol/L — ABNORMAL HIGH (ref 96–106)
GFR calc Af Amer: 33 mL/min/{1.73_m2} — ABNORMAL LOW (ref >59)
GFR calc non Af Amer: 29 mL/min/{1.73_m2} — ABNORMAL LOW (ref >59)
GLUCOSE: 102 mg/dL — AB (ref 65–99)
Potassium: 4.6 mmol/L (ref 3.5–5.2)
SODIUM: 145 mmol/L — AB (ref 134–144)

## 2018-09-02 LAB — TSH: TSH: 5.5 u[IU]/mL — AB (ref 0.450–4.500)

## 2018-09-03 ENCOUNTER — Telehealth: Payer: Self-pay | Admitting: *Deleted

## 2018-09-03 DIAGNOSIS — E8770 Fluid overload, unspecified: Secondary | ICD-10-CM

## 2018-09-03 MED ORDER — FUROSEMIDE 40 MG PO TABS: 60.0000 mg | ORAL_TABLET | Freq: Every day | ORAL | 3 refills | Status: DC

## 2018-09-03 NOTE — Telephone Encounter (Signed)
-----   Message from [name], MD sent at 09/02/2018  4:35 PM EDT ----- Pt remains anemic   Relatively stable over past month   Needs GI eval Platelets also low   Fluid is up as well   Kidney function is down but better than previous I would recomm:   Lasix 60 mg every other day Check BMET and BNP on Monday Forward to [facility]   Who is seeing patient

## 2018-09-03 NOTE — Telephone Encounter (Signed)
Spoke with patient's son, Louie Casa [facility]) Reviewed recommendations by [name] and labs/orders.  Also reviewed with [name] again. Pt does not have GI doctor.  Labs will be forwarded to PCP.  Pt has a new patient appointment with [name] on 10/29. No GI referral will be done at this time per [name].  Patient will take lasix 60 mg today, Saturday and Monday and come Tue for repeat labs. Adv son to call if pt would happen to not have a response to lasix (increase in urination).

## 2018-09-04 DIAGNOSIS — I130 Hypertensive heart and chronic kidney disease with heart failure and stage 1 through stage 4 chronic kidney disease, or unspecified chronic kidney disease: Secondary | ICD-10-CM

## 2018-09-04 DIAGNOSIS — R1311 Dysphagia, oral phase: Secondary | ICD-10-CM

## 2018-09-04 DIAGNOSIS — D631 Anemia in chronic kidney disease: Secondary | ICD-10-CM

## 2018-09-04 DIAGNOSIS — N184 Chronic kidney disease, stage 4 (severe): Secondary | ICD-10-CM

## 2018-09-04 DIAGNOSIS — S72111D Displaced fracture of greater trochanter of right femur, subsequent encounter for closed fracture with routine healing: Secondary | ICD-10-CM | POA: Diagnosis not present

## 2018-09-04 DIAGNOSIS — Z8673 Personal history of transient ischemic attack (TIA), and cerebral infarction without residual deficits: Secondary | ICD-10-CM

## 2018-09-04 DIAGNOSIS — G309 Alzheimer's disease, unspecified: Secondary | ICD-10-CM

## 2018-09-04 DIAGNOSIS — K4090 Unilateral inguinal hernia, without obstruction or gangrene, not specified as recurrent: Secondary | ICD-10-CM

## 2018-09-04 DIAGNOSIS — I480 Paroxysmal atrial fibrillation: Secondary | ICD-10-CM

## 2018-09-04 DIAGNOSIS — I5033 Acute on chronic diastolic (congestive) heart failure: Secondary | ICD-10-CM

## 2018-09-04 DIAGNOSIS — I2510 Atherosclerotic heart disease of native coronary artery without angina pectoris: Secondary | ICD-10-CM

## 2018-09-04 DIAGNOSIS — E785 Hyperlipidemia, unspecified: Secondary | ICD-10-CM

## 2018-09-04 DIAGNOSIS — L89612 Pressure ulcer of right heel, stage 2: Secondary | ICD-10-CM | POA: Diagnosis not present

## 2018-09-04 DIAGNOSIS — Z8701 Personal history of pneumonia (recurrent): Secondary | ICD-10-CM

## 2018-09-04 DIAGNOSIS — R339 Retention of urine, unspecified: Secondary | ICD-10-CM

## 2018-09-04 DIAGNOSIS — Z7982 Long term (current) use of aspirin: Secondary | ICD-10-CM

## 2018-09-04 DIAGNOSIS — Z9181 History of falling: Secondary | ICD-10-CM

## 2018-09-04 DIAGNOSIS — F0281 Dementia in other diseases classified elsewhere with behavioral disturbance: Secondary | ICD-10-CM

## 2018-09-04 DIAGNOSIS — E1122 Type 2 diabetes mellitus with diabetic chronic kidney disease: Secondary | ICD-10-CM

## 2018-09-04 DIAGNOSIS — M1991 Primary osteoarthritis, unspecified site: Secondary | ICD-10-CM

## 2018-09-04 DIAGNOSIS — Z951 Presence of aortocoronary bypass graft: Secondary | ICD-10-CM

## 2018-09-08 ENCOUNTER — Other Ambulatory Visit: Payer: Medicare Other

## 2018-09-08 DIAGNOSIS — E8770 Fluid overload, unspecified: Secondary | ICD-10-CM

## 2018-09-08 LAB — BASIC METABOLIC PANEL
BUN/Creatinine Ratio: 17 (ref 10–24)
BUN: 34 mg/dL (ref 10–36)
CALCIUM: 8.4 mg/dL — AB (ref 8.6–10.2)
CO2: 19 mmol/L — ABNORMAL LOW (ref 20–29)
Chloride: 108 mmol/L — ABNORMAL HIGH (ref 96–106)
Creatinine, Ser: 2.02 mg/dL — ABNORMAL HIGH (ref 0.76–1.27)
GFR, EST AFRICAN AMERICAN: 32 mL/min/{1.73_m2} — AB (ref >59)
GFR, EST NON AFRICAN AMERICAN: 28 mL/min/{1.73_m2} — AB (ref >59)
Glucose: 206 mg/dL — ABNORMAL HIGH (ref 65–99)
Potassium: 5.4 mmol/L — ABNORMAL HIGH (ref 3.5–5.2)
Sodium: 141 mmol/L (ref 134–144)

## 2018-09-08 LAB — PRO B NATRIURETIC PEPTIDE: NT-Pro BNP: 21845 pg/mL — ABNORMAL HIGH (ref 0–486)

## 2018-09-09 ENCOUNTER — Ambulatory Visit: Payer: Medicare Other | Admitting: Internal Medicine

## 2018-09-09 ENCOUNTER — Telehealth: Payer: Self-pay | Admitting: Internal Medicine

## 2018-09-09 ENCOUNTER — Telehealth: Payer: Self-pay | Admitting: *Deleted

## 2018-09-09 ENCOUNTER — Other Ambulatory Visit (INDEPENDENT_AMBULATORY_CARE_PROVIDER_SITE_OTHER): Payer: Self-pay

## 2018-09-09 ENCOUNTER — Telehealth (INDEPENDENT_AMBULATORY_CARE_PROVIDER_SITE_OTHER): Payer: Self-pay | Admitting: Orthopaedic Surgery

## 2018-09-09 DIAGNOSIS — E8770 Fluid overload, unspecified: Secondary | ICD-10-CM

## 2018-09-09 DIAGNOSIS — L97411 Non-pressure chronic ulcer of right heel and midfoot limited to breakdown of skin: Secondary | ICD-10-CM

## 2018-09-09 NOTE — Telephone Encounter (Signed)
Notes recorded by [name], RN on 09/09/2018 at 10:39 AM EDT Reviewed with [name] who recommends pt take lasix 80 mg today, Thurs and Fri and then Sat start 60 mg every day. No potassium at this time. Needs repeat BMET, BNP on Tuesday. Go to ER if symptoms worsen. ------  Notes recorded by [name], RN on 09/09/2018 at 9:43 AM EDT Called to see how patient is. He answered the phone. Michela Pitcher he is ok. Sounds little SOB on phone. States his breathing is no worse than it has been over the last couple months. Could not lie down to sleep last night. Had to get up and sit in chair. Asked about response to lasix, urination was moderate. Is due for 60 mg lasix today following the order for 60 QOD per [name].  His son who lives with him is at the grocery store. Asked that he call back when he gets back. Message to [name] to review labs ASAP.

## 2018-09-09 NOTE — Telephone Encounter (Signed)
Spoke with patient's son, Chriss Czar.  He and his brother manage parent's meds/care. Adv of rec from Dr. Barnet Glasgow take 80 mg lasix every day for 3 days, then decrease back to 60 mg daily and will come for repeat blood work on Tuesday.  Son is aware that if patient's symptoms worsen he should be seen urgently in the ER.

## 2018-09-09 NOTE — Telephone Encounter (Signed)
Patient's son called inquiring about the wound center his dad was suppose to be sent by Dr. Erlinda Hong.  Please advise son.  CB#[phone].  Thank you.

## 2018-09-09 NOTE — Telephone Encounter (Signed)
Did not see a referral made for him I placed a referral today. Can we try to get this done soon please.

## 2018-09-09 NOTE — Telephone Encounter (Signed)
New message:      Pt c/o medication issue:  1. Name of Medication: furosemide (LASIX) 40 MG tablet  2. How are you currently taking this medication (dosage and times per day)?Take 1.5 tablets (60 mg total) by mouth daily.   3. Are you having a reaction (difficulty breathing--STAT)? No  4. What is your medication issue? Pt's son is calling and needs clarification on how the pt is supposed to take this medication.

## 2018-09-09 NOTE — Telephone Encounter (Signed)
Duplicate. See other phone encounter dated 09/09/18.

## 2018-09-09 NOTE — Addendum Note (Signed)
Addended by: [name] on: 09/09/2018 12:10 PM   Modules accepted: Orders

## 2018-09-09 NOTE — Telephone Encounter (Signed)
Patient's other son, Louie Casa called me back. Reviewed plan and med change with him. Also reviewed that if pt does not improve [name] is considering hospital admission to help this process. Also adv that if SOB worsens he should go to ER for eval. He will discuss with patient and with brother and will be at patient's house Thurs and Fri and will monitor pt.

## 2018-09-10 ENCOUNTER — Ambulatory Visit: Payer: Medicare Other | Admitting: Internal Medicine

## 2018-09-10 NOTE — Telephone Encounter (Signed)
Referral placed to [facility]

## 2018-09-15 ENCOUNTER — Ambulatory Visit (INDEPENDENT_AMBULATORY_CARE_PROVIDER_SITE_OTHER): Payer: Medicare Other | Admitting: Internal Medicine

## 2018-09-15 ENCOUNTER — Encounter: Payer: Self-pay | Admitting: Internal Medicine

## 2018-09-15 ENCOUNTER — Other Ambulatory Visit: Payer: Medicare Other

## 2018-09-15 VITALS — BP 126/58 | HR 83 | Temp 98.6°F | Resp 14 | Ht 72.0 in | Wt 167.0 lb

## 2018-09-15 DIAGNOSIS — E785 Hyperlipidemia, unspecified: Secondary | ICD-10-CM

## 2018-09-15 DIAGNOSIS — N183 Chronic kidney disease, stage 3 unspecified: Secondary | ICD-10-CM

## 2018-09-15 DIAGNOSIS — I480 Paroxysmal atrial fibrillation: Secondary | ICD-10-CM

## 2018-09-15 DIAGNOSIS — I2510 Atherosclerotic heart disease of native coronary artery without angina pectoris: Secondary | ICD-10-CM

## 2018-09-15 DIAGNOSIS — I10 Essential (primary) hypertension: Secondary | ICD-10-CM

## 2018-09-15 DIAGNOSIS — D649 Anemia, unspecified: Secondary | ICD-10-CM

## 2018-09-15 DIAGNOSIS — E8770 Fluid overload, unspecified: Secondary | ICD-10-CM

## 2018-09-15 DIAGNOSIS — F0390 Unspecified dementia without behavioral disturbance: Secondary | ICD-10-CM

## 2018-09-15 DIAGNOSIS — E1122 Type 2 diabetes mellitus with diabetic chronic kidney disease: Secondary | ICD-10-CM | POA: Diagnosis not present

## 2018-09-15 DIAGNOSIS — N184 Chronic kidney disease, stage 4 (severe): Secondary | ICD-10-CM | POA: Diagnosis not present

## 2018-09-15 LAB — BASIC METABOLIC PANEL
BUN / CREAT RATIO: 17 (ref 10–24)
BUN: 42 mg/dL — ABNORMAL HIGH (ref 10–36)
CHLORIDE: 99 mmol/L (ref 96–106)
CO2: 23 mmol/L (ref 20–29)
Calcium: 8.2 mg/dL — ABNORMAL LOW (ref 8.6–10.2)
Creatinine, Ser: 2.45 mg/dL — ABNORMAL HIGH (ref 0.76–1.27)
GFR calc non Af Amer: 22 mL/min/{1.73_m2} — ABNORMAL LOW (ref >59)
GFR, EST AFRICAN AMERICAN: 25 mL/min/{1.73_m2} — AB (ref >59)
Glucose: 138 mg/dL — ABNORMAL HIGH (ref 65–99)
POTASSIUM: 4.9 mmol/L (ref 3.5–5.2)
Sodium: 136 mmol/L (ref 134–144)

## 2018-09-15 LAB — PRO B NATRIURETIC PEPTIDE: NT-PRO BNP: 18306 pg/mL — AB (ref 0–486)

## 2018-09-15 NOTE — Assessment & Plan Note (Signed)
Sugars are well controlled Continue prandin F/u in December  - will recheck a1c then

## 2018-09-15 NOTE — Assessment & Plan Note (Signed)
Blood work ordered by Dr Harrington Challenger - pending (cmp, bnp)  - lasix dose increased for elevated BNP, leg edema Not following with nephrology Leg edema improved

## 2018-09-15 NOTE — Assessment & Plan Note (Addendum)
Paroxysmal Afib Following with Dr Harrington Challenger Not on eliquis due to anemia of unknown cause Rate controlled asymptomatic

## 2018-09-15 NOTE — Assessment & Plan Note (Signed)
BP well controlled Current regimen effective and well tolerated Continue current medications at current doses  

## 2018-09-15 NOTE — Assessment & Plan Note (Signed)
Continue statin. 

## 2018-09-15 NOTE — Assessment & Plan Note (Signed)
Stable on aricept continue

## 2018-09-15 NOTE — Patient Instructions (Addendum)
  Medications reviewed and updated.  Changes include :   none   Please followup in  December

## 2018-09-15 NOTE — Progress Notes (Signed)
Subjective:    Patient ID: Chad Mcmillan, male    DOB: October 11, 1924, 84 y.o.   MRN: [id number]  HPI  He is here to establish with a new pcp.   The patient is here for follow up.  He follows w/  cardiology, [name]   Orthopedics, Dr. Erlinda Hong  CAD, paroxysmal atrial fibrillation, hypertension: He follows with cardiology.  He developed atrial fibrillation after his hip surgery and was briefly on Eliquis, but that was discontinued in due to anemia and thrombocytopenia.  He has seen cardiology earlier this month and [name] did not recommend Eliquis due to his persistent anemia.  He is taking his medication daily. He is compliant with a low sodium diet.  He denies chest pain, palpitations and regular headaches. He is not exercising regularly.     Hyperlipidemia: He is taking his medication daily. He is compliant with a low fat/cholesterol diet.    Diabetes: He is taking his medication daily as prescribed. He is compliant with a diabetic diet.  He monitors his sugars and they have been running 128-140.    Right hip fracture: He fell at home in August and fractured his hip.  He had surgical repair.  He is following with orthopedics. His healing is coming along good per ortho.   Heel ulcer:  It started in August.  It is sore.  He follows with ortho.  He has someone come out 2/week to change the bandage.  It is improving - just very slowly.    Anemia:  Had black stool a couple of months ago.  No recent black stool.  No brpbr. FOBT negative in August.   He denies GERD, nausea and abdominal pain.    Medications and allergies reviewed with patient and updated if appropriate.  Patient Active Problem List   Diagnosis Date Noted  . Paroxysmal atrial fibrillation with RVR ([place])   . Acute diastolic heart failure ([place])   . S/P CABG (coronary artery bypass graft)   . Closed fracture of acromial end of left clavicle 07/19/2018    Class: Acute  . Closed displaced fracture of greater trochanter of right femur  ([place]) 07/17/2018  . History of thrombocytopenia 07/17/2018  . Hypocalcemia 02/12/2018  . Agitation   . Dementia ([place])   . CKD (chronic kidney disease), stage IV ([place]) 01/14/2018  . Inguinal hernia of right side without obstruction or gangrene 01/12/2018  . Vertigo 01/07/2018  . LBBB (left bundle branch block) 01/07/2018  . Generalized weakness 01/07/2018  . Foot pain, bilateral 10/07/2017  . Dizzy 01/31/2016  . Pancytopenia ([place]) 07/27/2012  . DM (diabetes mellitus) ([place]) 07/27/2012  . Anemia 07/27/2012  . NASH (nonalcoholic steatohepatitis) 07/17/2010  . ADENOCARCINOMA, PROSTATE 06/01/2008  . History of stroke 06/01/2008  . NEOPLASM, MALIGNANT, SKIN, FACE 06/01/2008  . Dyslipidemia 06/13/2007  . Essential hypertension 06/13/2007  . CAD (coronary artery disease) 06/13/2007    Current Outpatient Medications on File Prior to Visit  Medication Sig Dispense Refill  . amLODipine (NORVASC) 10 MG tablet Take 1 tablet (10 mg total) by mouth daily.    Marland Kitchen aspirin EC 81 MG tablet Take 1 tablet (81 mg total) by mouth daily. 90 tablet 3  . atorvastatin (LIPITOR) 40 MG tablet Take 2 tablets (80 mg total) by mouth daily at 6 PM. 60 tablet 4  . donepezil (ARICEPT) 5 MG tablet Take 1 tablet (5 mg total) by mouth at bedtime. 30 tablet 11  . furosemide (LASIX) 40 MG tablet  Take 1.5 tablets (60 mg total) by mouth daily. 90 tablet 3  . HYDROcodone-acetaminophen (NORCO) 7.5-325 MG tablet Take 1-2 tablets by mouth every 6 (six) hours as needed for moderate pain. 30 tablet 0  . lisinopril (PRINIVIL,ZESTRIL) 5 MG tablet Take 5 mg by mouth daily.    Marland Kitchen LORazepam (ATIVAN) 0.5 MG tablet Take 0.5 mg by mouth at bedtime as needed for anxiety.    . meclizine (ANTIVERT) 12.5 MG tablet TAKE 1 TABLET BY MOUTH THREE TIMES DAILY AS NEEDED FOR DIZZINESS (Patient taking differently: Take 12.5 mg by mouth 3 (three) times daily as needed for dizziness. ) 30 tablet 3  . Multiple Vitamins-Minerals (DECUBI-VITE PO) Take 1  capsule by mouth daily.    . nitroGLYCERIN (NITROSTAT) 0.4 MG SL tablet Place 1 tablet (0.4 mg total) under the tongue every 5 (five) minutes as needed. For chest pain 25 tablet 3  . polyethylene glycol (MIRALAX / GLYCOLAX) packet Take 17 g by mouth daily.    . repaglinide (PRANDIN) 0.5 MG tablet Take 1 tablet (0.5 mg total) by mouth 3 (three) times daily before meals. 270 tablet 3  . senna-docusate (SENOKOT-S) 8.6-50 MG tablet Take 1 tablet by mouth at bedtime as needed for mild constipation or moderate constipation.    . vitamin B-12 1000 MCG tablet Take 1 tablet (1,000 mcg total) by mouth daily. 30 tablet 1   No current facility-administered medications on file prior to visit.     Past Medical History:  Diagnosis Date  . ADENOCARCINOMA, PROSTATE 06/01/2008  . CORONARY ARTERY DISEASE 06/13/2007  . CVA 06/01/2008   x 2  . DIABETES MELLITUS, TYPE I, WITH OPHTHALMIC COMPLICATIONS 3/87/5643  . 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 HYPERKALEMIA 06/01/2008   due to DM neparopathy  . HYPERLIPIDEMIA 06/13/2007  . HYPERTENSION 06/13/2007  . NEOPLASM, MALIGNANT, SKIN, FACE 06/01/2008  . Other chronic nonalcoholic liver disease 02/14/5187  . RENAL DISEASE 06/01/2008    Past Surgical History:  Procedure Laterality Date  . Carotid Duplex  09/12/2004  . CATARACT EXTRACTION    . CHOLECYSTECTOMY    . ESOPHAGOGASTRODUODENOSCOPY  06/28/1991  . FEMUR IM NAIL Right 07/17/2018  . HERNIA REPAIR     right Inguinal hernia  . INTRAMEDULLARY (IM) NAIL INTERTROCHANTERIC N/A 07/17/2018   Procedure: INTRAMEDULLARY (IM) NAIL INTERTROCHANTRIC;  Surgeon: Leandrew Koyanagi, MD;  Location: [facility];  Service: Orthopedics;  Laterality: N/A;  . Rest/Stress Cardiolite  01/22/2001    Social History   Socioeconomic History  . Marital status: Married    Spouse name: Not on file  . Number of children: Not on file  . Years of education: Not on file  . Highest education level: Not on file  Occupational History  . Occupation: Retired     Fish farm manager: RETIRED  Social Needs  . Financial resource strain: Not on file  . Food insecurity:    Worry: Not on file    Inability: Not on file  . Transportation needs:    Medical: Not on file    Non-medical: Not on file  Tobacco Use  . Smoking status: Former Smoker    Last attempt to quit: 11/18/1978    Years since quitting: 39.8  . Smokeless tobacco: Never Used  Substance and Sexual Activity  . Alcohol use: No  . Drug use: No  . Sexual activity: Not Currently  Lifestyle  . Physical activity:    Days per week: Not on file    Minutes per session: Not on file  .  Stress: Not on file  Relationships  . Social connections:    Talks on phone: Not on file    Gets together: Not on file    Attends religious service: Not on file    Active member of club or organization: Not on file    Attends meetings of clubs or organizations: Not on file    Relationship status: Not on file  Other Topics Concern  . Not on file  Social History Narrative  . Not on file    Family History  Problem Relation Age of Onset  . Vision loss Son     Review of Systems  Constitutional: Negative for appetite change, chills and fever.  Respiratory: Positive for shortness of breath (sometimes with walk). Negative for cough and wheezing.   Cardiovascular: Positive for leg swelling. Negative for chest pain and palpitations.  Gastrointestinal: Negative for abdominal pain, blood in stool, constipation, diarrhea and nausea.       No gerd  Endocrine: Positive for cold intolerance.  Neurological: Negative for light-headedness and headaches.  Psychiatric/Behavioral: Positive for dysphoric mood (mild at times). Negative for sleep disturbance. The patient is not nervous/anxious.        Objective:   Vitals:   09/15/18 1445  BP: (!) 126/58  Pulse: 83  Resp: 14  Temp: 98.6 F (37 C)  SpO2: 98%   BP Readings from Last 3 Encounters:  09/15/18 (!) 126/58  09/01/18 (!) 140/54  08/07/18 (!) 134/48   Wt Readings  from Last 3 Encounters:  09/15/18 167 lb (75.8 kg)  09/01/18 185 lb (83.9 kg)  07/24/18 172 lb 9.9 oz (78.3 kg)   Body mass index is 22.65 kg/m.   Physical Exam    Constitutional: Appears well-developed and well-nourished. No distress.  HENT:  Head: Normocephalic and atraumatic.  Neck: Neck supple. No tracheal deviation present. No thyromegaly present.  No cervical lymphadenopathy Cardiovascular: Normal rate, regular rhythm and normal heart sounds.   No murmur heard. .  1+ pitting edema b/l LE just above ankles Pulmonary/Chest: Effort normal and breath sounds normal. No respiratory distress. No has no wheezes. No rales.  Skin: Skin is warm and dry. Not diaphoretic.  Psychiatric: Normal mood and affect. Behavior is normal.      Assessment & Plan:    See Problem List for Assessment and Plan of chronic medical problems.

## 2018-09-18 ENCOUNTER — Other Ambulatory Visit: Payer: Self-pay | Admitting: *Deleted

## 2018-09-18 ENCOUNTER — Telehealth: Payer: Self-pay | Admitting: Internal Medicine

## 2018-09-18 DIAGNOSIS — N184 Chronic kidney disease, stage 4 (severe): Secondary | ICD-10-CM

## 2018-09-18 DIAGNOSIS — I5031 Acute diastolic (congestive) heart failure: Secondary | ICD-10-CM

## 2018-09-18 DIAGNOSIS — R531 Weakness: Secondary | ICD-10-CM

## 2018-09-18 NOTE — Telephone Encounter (Signed)
Copied from [place] [id number]. Topic: Quick Communication - See Telephone Encounter >> Sep 18, 2018 12:18 PM Rutherford Nail, NT wrote: CRM for notification. See Telephone encounter for: 09/18/18. Patient's son, Chriss Czar, calling and states that he is needing an FL2 form filled out. States that they are thinking of placing patient into a nursing home. CB#: [phone]

## 2018-09-20 ENCOUNTER — Other Ambulatory Visit: Payer: Self-pay | Admitting: Endocrinology

## 2018-09-21 ENCOUNTER — Telehealth: Payer: Self-pay | Admitting: Internal Medicine

## 2018-09-21 NOTE — Telephone Encounter (Signed)
Notified Debra w/MD response.Marland KitchenJohny Mcmillan

## 2018-09-21 NOTE — Telephone Encounter (Signed)
Please refer to refill request

## 2018-09-21 NOTE — Telephone Encounter (Signed)
Copied from [place] [phone]. Topic: Quick Communication - Home Health Verbal Orders >> Sep 21, 2018  8:40 AM Bea Graff, NT wrote: Caller/Agency: Hilda Blades- [facility] Number: [phone] Requesting OT/PT/Skilled Nursing/Social Work: nursing orders Frequency: To continue to see for 2 times a week for pressure ulcer

## 2018-09-21 NOTE — Telephone Encounter (Signed)
ok 

## 2018-09-22 NOTE — Telephone Encounter (Signed)
LVM for son to call back so form can be filled out together.

## 2018-09-24 NOTE — Telephone Encounter (Signed)
Spoke with son in regards to [facility] he wants one filled out of pt and wife. They are just looking at options right know on where they can go. There is no rush on the FL2s. I got some information to get the forms filled out appropriately

## 2018-09-28 ENCOUNTER — Other Ambulatory Visit: Payer: Medicare Other | Admitting: *Deleted

## 2018-09-28 DIAGNOSIS — R531 Weakness: Secondary | ICD-10-CM

## 2018-09-28 DIAGNOSIS — N184 Chronic kidney disease, stage 4 (severe): Secondary | ICD-10-CM

## 2018-09-29 LAB — CBC
HEMATOCRIT: 28 % — AB (ref 37.5–51.0)
Hemoglobin: 9.1 g/dL — ABNORMAL LOW (ref 13.0–17.7)
MCH: 31.8 pg (ref 26.6–33.0)
MCHC: 32.5 g/dL (ref 31.5–35.7)
MCV: 98 fL — ABNORMAL HIGH (ref 79–97)
Platelets: 127 10*3/uL — ABNORMAL LOW (ref 150–450)
RBC: 2.86 x10E6/uL — ABNORMAL LOW (ref 4.14–5.80)
RDW: 12.8 % (ref 12.3–15.4)
WBC: 6.6 10*3/uL (ref 3.4–10.8)

## 2018-09-29 LAB — BASIC METABOLIC PANEL
BUN / CREAT RATIO: 20 (ref 10–24)
BUN: 48 mg/dL — ABNORMAL HIGH (ref 10–36)
CHLORIDE: 102 mmol/L (ref 96–106)
CO2: 23 mmol/L (ref 20–29)
CREATININE: 2.43 mg/dL — AB (ref 0.76–1.27)
Calcium: 8.5 mg/dL — ABNORMAL LOW (ref 8.6–10.2)
GFR calc Af Amer: 26 mL/min/{1.73_m2} — ABNORMAL LOW (ref >59)
GFR calc non Af Amer: 22 mL/min/{1.73_m2} — ABNORMAL LOW (ref >59)
GLUCOSE: 138 mg/dL — AB (ref 65–99)
Potassium: 5 mmol/L (ref 3.5–5.2)
SODIUM: 140 mmol/L (ref 134–144)

## 2018-09-29 LAB — PRO B NATRIURETIC PEPTIDE: NT-PRO BNP: 12877 pg/mL — AB (ref 0–486)

## 2018-10-01 ENCOUNTER — Encounter (HOSPITAL_BASED_OUTPATIENT_CLINIC_OR_DEPARTMENT_OTHER): Payer: Medicare Other | Attending: Internal Medicine

## 2018-10-01 DIAGNOSIS — Z8546 Personal history of malignant neoplasm of prostate: Secondary | ICD-10-CM | POA: Insufficient documentation

## 2018-10-01 DIAGNOSIS — E1122 Type 2 diabetes mellitus with diabetic chronic kidney disease: Secondary | ICD-10-CM | POA: Insufficient documentation

## 2018-10-01 DIAGNOSIS — I4891 Unspecified atrial fibrillation: Secondary | ICD-10-CM | POA: Diagnosis not present

## 2018-10-01 DIAGNOSIS — L97411 Non-pressure chronic ulcer of right heel and midfoot limited to breakdown of skin: Secondary | ICD-10-CM | POA: Diagnosis not present

## 2018-10-01 DIAGNOSIS — N184 Chronic kidney disease, stage 4 (severe): Secondary | ICD-10-CM | POA: Diagnosis not present

## 2018-10-01 DIAGNOSIS — F0390 Unspecified dementia without behavioral disturbance: Secondary | ICD-10-CM | POA: Diagnosis not present

## 2018-10-01 DIAGNOSIS — E1142 Type 2 diabetes mellitus with diabetic polyneuropathy: Secondary | ICD-10-CM | POA: Insufficient documentation

## 2018-10-01 DIAGNOSIS — Z85828 Personal history of other malignant neoplasm of skin: Secondary | ICD-10-CM | POA: Diagnosis not present

## 2018-10-01 DIAGNOSIS — Z923 Personal history of irradiation: Secondary | ICD-10-CM | POA: Insufficient documentation

## 2018-10-01 DIAGNOSIS — E1140 Type 2 diabetes mellitus with diabetic neuropathy, unspecified: Secondary | ICD-10-CM | POA: Diagnosis not present

## 2018-10-01 DIAGNOSIS — E11621 Type 2 diabetes mellitus with foot ulcer: Secondary | ICD-10-CM | POA: Diagnosis not present

## 2018-10-01 DIAGNOSIS — I129 Hypertensive chronic kidney disease with stage 1 through stage 4 chronic kidney disease, or unspecified chronic kidney disease: Secondary | ICD-10-CM | POA: Insufficient documentation

## 2018-10-02 ENCOUNTER — Telehealth: Payer: Self-pay | Admitting: Internal Medicine

## 2018-10-02 ENCOUNTER — Other Ambulatory Visit: Payer: Self-pay | Admitting: *Deleted

## 2018-10-02 DIAGNOSIS — I5031 Acute diastolic (congestive) heart failure: Secondary | ICD-10-CM

## 2018-10-02 DIAGNOSIS — Z79899 Other long term (current) drug therapy: Secondary | ICD-10-CM

## 2018-10-02 DIAGNOSIS — F0390 Unspecified dementia without behavioral disturbance: Secondary | ICD-10-CM

## 2018-10-02 DIAGNOSIS — N184 Chronic kidney disease, stage 4 (severe): Secondary | ICD-10-CM

## 2018-10-02 NOTE — Telephone Encounter (Signed)
Palliative Care SW phoned patient to schedule a home visit.  Provided education regarding Palliative Care and [facility].  He refused services and hung up.

## 2018-10-02 NOTE — Addendum Note (Signed)
Addended by: Delice Bison E on: 10/02/2018 01:51 PM   Modules accepted: Orders

## 2018-10-02 NOTE — Telephone Encounter (Signed)
Copied from [place] [phone]. Topic: Referral - Request for Referral >> Oct 02, 2018 11:29 AM [name] wrote: Has patient seen PCP for this complaint? No - last OV 09/15/18 *If NO, is insurance requiring patient see PCP for this issue before PCP can refer them? Referral for which specialty: Hospice Eval Preferred provider/office: [facility] Reason for referral: Juanda Crumble states that pts son, [name], has requested a hospice eval. Please fax referral, demos, and recent OV notes to [facility], if approved. Fax # [phone]

## 2018-10-02 NOTE — Telephone Encounter (Signed)
Pt's on aware of lab results see notes on lab result notes ./cy

## 2018-10-02 NOTE — Telephone Encounter (Signed)
Referral and office notes faxed over.

## 2018-10-02 NOTE — Telephone Encounter (Signed)
LVM for son letting him know that FL2 forms are ready for pick up

## 2018-10-02 NOTE — Telephone Encounter (Signed)
Pt son ron is calling back and he would like to know the status of FL2 form . Please call when ready he will pick up forms

## 2018-10-02 NOTE — Telephone Encounter (Signed)
New Message           Patient's son called today Rogelia Rohrer) returning Michealene's phone call. Pls call and advise.

## 2018-10-13 ENCOUNTER — Ambulatory Visit (INDEPENDENT_AMBULATORY_CARE_PROVIDER_SITE_OTHER): Payer: Medicare Other | Admitting: Orthopaedic Surgery

## 2018-10-18 ENCOUNTER — Other Ambulatory Visit: Payer: Self-pay | Admitting: Endocrinology

## 2018-10-19 ENCOUNTER — Telehealth: Payer: Self-pay | Admitting: Internal Medicine

## 2018-10-19 NOTE — Telephone Encounter (Signed)
Please advise if refill is appropriate 

## 2018-10-19 NOTE — Telephone Encounter (Signed)
Please ask PCP to consider refills, if clinically appropriate

## 2018-10-19 NOTE — Telephone Encounter (Signed)
ok 

## 2018-10-19 NOTE — Telephone Encounter (Signed)
Copied from [place] [phone]. Topic: Quick Communication - Home Health Verbal Orders >> Oct 19, 2018 12:56 PM Lennox Solders wrote: Caller/Agency: debra rn [facility] Callback Number: [phone]. Chad Mcmillan needs verbal order for skilled nursing for wound care 3 times a wk for  9 weeks

## 2018-10-20 NOTE — Telephone Encounter (Signed)
Notified Debra w/MD response../lmb 

## 2018-10-22 ENCOUNTER — Telehealth: Payer: Self-pay | Admitting: Internal Medicine

## 2018-10-22 NOTE — Telephone Encounter (Signed)
Following with orthopedics. 

## 2018-10-22 NOTE — Telephone Encounter (Signed)
Copied from [place] [phone]. Topic: Quick Communication - Home Health Verbal Orders >> Oct 22, 2018 12:37 PM Carolyn Stare wrote: Caller/Agency    April with [facility] call to say the wound on pt right heal has increased in size and she wanted to let the doctor know    [phone]

## 2018-10-23 NOTE — Telephone Encounter (Signed)
Spoke with Chad Mcmillan to advise of response below.

## 2018-10-26 ENCOUNTER — Encounter (HOSPITAL_BASED_OUTPATIENT_CLINIC_OR_DEPARTMENT_OTHER): Payer: Medicare Other | Attending: Internal Medicine

## 2018-10-26 DIAGNOSIS — F0390 Unspecified dementia without behavioral disturbance: Secondary | ICD-10-CM | POA: Insufficient documentation

## 2018-10-26 DIAGNOSIS — E1142 Type 2 diabetes mellitus with diabetic polyneuropathy: Secondary | ICD-10-CM | POA: Insufficient documentation

## 2018-10-26 DIAGNOSIS — I10 Essential (primary) hypertension: Secondary | ICD-10-CM | POA: Insufficient documentation

## 2018-10-26 DIAGNOSIS — Z7984 Long term (current) use of oral hypoglycemic drugs: Secondary | ICD-10-CM | POA: Diagnosis not present

## 2018-10-26 DIAGNOSIS — E1136 Type 2 diabetes mellitus with diabetic cataract: Secondary | ICD-10-CM | POA: Diagnosis not present

## 2018-10-26 DIAGNOSIS — Z923 Personal history of irradiation: Secondary | ICD-10-CM | POA: Insufficient documentation

## 2018-10-26 DIAGNOSIS — E11621 Type 2 diabetes mellitus with foot ulcer: Secondary | ICD-10-CM | POA: Diagnosis present

## 2018-10-26 DIAGNOSIS — L97412 Non-pressure chronic ulcer of right heel and midfoot with fat layer exposed: Secondary | ICD-10-CM | POA: Insufficient documentation

## 2018-11-02 ENCOUNTER — Ambulatory Visit: Payer: Medicare Other | Admitting: Internal Medicine

## 2018-11-02 ENCOUNTER — Other Ambulatory Visit: Payer: Medicare Other

## 2018-11-02 DIAGNOSIS — E11621 Type 2 diabetes mellitus with foot ulcer: Secondary | ICD-10-CM | POA: Diagnosis not present

## 2018-11-03 ENCOUNTER — Ambulatory Visit: Payer: Medicare Other | Admitting: Internal Medicine

## 2018-11-04 ENCOUNTER — Ambulatory Visit: Payer: Medicare Other | Admitting: Internal Medicine

## 2018-11-12 NOTE — Progress Notes (Signed)
Please send in oscal TID x 6 months

## 2018-11-13 ENCOUNTER — Other Ambulatory Visit (INDEPENDENT_AMBULATORY_CARE_PROVIDER_SITE_OTHER): Payer: Self-pay

## 2018-11-16 ENCOUNTER — Other Ambulatory Visit (INDEPENDENT_AMBULATORY_CARE_PROVIDER_SITE_OTHER): Payer: Self-pay

## 2018-11-16 DIAGNOSIS — E11621 Type 2 diabetes mellitus with foot ulcer: Secondary | ICD-10-CM | POA: Diagnosis not present

## 2018-11-16 MED ORDER — CALCIUM CARBONATE-VITAMIN D 500-200 MG-UNIT PO TABS: ORAL_TABLET | ORAL | 3 refills | Status: DC

## 2018-11-24 ENCOUNTER — Other Ambulatory Visit (HOSPITAL_COMMUNITY)
Admission: RE | Admit: 2018-11-24 | Discharge: 2018-11-24 | Disposition: A | Discharge status: Home / Self Care | Payer: Medicare Other | Source: Other Acute Inpatient Hospital | Attending: Internal Medicine | Admitting: Internal Medicine

## 2018-11-24 ENCOUNTER — Encounter (HOSPITAL_BASED_OUTPATIENT_CLINIC_OR_DEPARTMENT_OTHER): Payer: Medicare Other | Attending: Internal Medicine

## 2018-11-24 ENCOUNTER — Encounter (HOSPITAL_BASED_OUTPATIENT_CLINIC_OR_DEPARTMENT_OTHER): Payer: Self-pay

## 2018-11-24 DIAGNOSIS — E11622 Type 2 diabetes mellitus with other skin ulcer: Secondary | ICD-10-CM | POA: Diagnosis not present

## 2018-11-24 DIAGNOSIS — E1142 Type 2 diabetes mellitus with diabetic polyneuropathy: Secondary | ICD-10-CM | POA: Insufficient documentation

## 2018-11-24 DIAGNOSIS — I10 Essential (primary) hypertension: Secondary | ICD-10-CM | POA: Diagnosis not present

## 2018-11-24 DIAGNOSIS — L97412 Non-pressure chronic ulcer of right heel and midfoot with fat layer exposed: Secondary | ICD-10-CM | POA: Diagnosis present

## 2018-11-24 DIAGNOSIS — L97312 Non-pressure chronic ulcer of right ankle with fat layer exposed: Secondary | ICD-10-CM | POA: Diagnosis not present

## 2018-11-24 DIAGNOSIS — B9562 Methicillin resistant Staphylococcus aureus infection as the cause of diseases classified elsewhere: Secondary | ICD-10-CM | POA: Insufficient documentation

## 2018-11-24 DIAGNOSIS — L03115 Cellulitis of right lower limb: Secondary | ICD-10-CM | POA: Insufficient documentation

## 2018-11-24 DIAGNOSIS — Z923 Personal history of irradiation: Secondary | ICD-10-CM | POA: Diagnosis not present

## 2018-11-24 DIAGNOSIS — E11621 Type 2 diabetes mellitus with foot ulcer: Secondary | ICD-10-CM | POA: Insufficient documentation

## 2018-11-24 DIAGNOSIS — L97512 Non-pressure chronic ulcer of other part of right foot with fat layer exposed: Secondary | ICD-10-CM | POA: Diagnosis not present

## 2018-11-24 DIAGNOSIS — F0390 Unspecified dementia without behavioral disturbance: Secondary | ICD-10-CM | POA: Diagnosis not present

## 2018-11-27 ENCOUNTER — Ambulatory Visit (INDEPENDENT_AMBULATORY_CARE_PROVIDER_SITE_OTHER): Payer: Medicare Other | Admitting: Cardiology

## 2018-11-27 ENCOUNTER — Other Ambulatory Visit: Payer: Medicare Other | Admitting: *Deleted

## 2018-11-27 ENCOUNTER — Encounter: Payer: Self-pay | Admitting: Cardiology

## 2018-11-27 VITALS — BP 90/40 | HR 85 | Ht 72.0 in | Wt 147.8 lb

## 2018-11-27 DIAGNOSIS — I10 Essential (primary) hypertension: Secondary | ICD-10-CM

## 2018-11-27 DIAGNOSIS — I2510 Atherosclerotic heart disease of native coronary artery without angina pectoris: Secondary | ICD-10-CM

## 2018-11-27 DIAGNOSIS — E785 Hyperlipidemia, unspecified: Secondary | ICD-10-CM

## 2018-11-27 DIAGNOSIS — I480 Paroxysmal atrial fibrillation: Secondary | ICD-10-CM | POA: Diagnosis not present

## 2018-11-27 DIAGNOSIS — Z79899 Other long term (current) drug therapy: Secondary | ICD-10-CM

## 2018-11-27 DIAGNOSIS — I5031 Acute diastolic (congestive) heart failure: Secondary | ICD-10-CM | POA: Diagnosis not present

## 2018-11-27 LAB — BASIC METABOLIC PANEL
BUN/Creatinine Ratio: 30 — ABNORMAL HIGH (ref 10–24)
BUN: 75 mg/dL — ABNORMAL HIGH (ref 10–36)
CO2: 19 mmol/L — ABNORMAL LOW (ref 20–29)
Calcium: 8.9 mg/dL (ref 8.6–10.2)
Chloride: 101 mmol/L (ref 96–106)
Creatinine, Ser: 2.53 mg/dL — ABNORMAL HIGH (ref 0.76–1.27)
GFR calc Af Amer: 24 mL/min/{1.73_m2} — ABNORMAL LOW (ref >59)
GFR calc non Af Amer: 21 mL/min/{1.73_m2} — ABNORMAL LOW (ref >59)
Glucose: 139 mg/dL — ABNORMAL HIGH (ref 65–99)
Potassium: 5.6 mmol/L — ABNORMAL HIGH (ref 3.5–5.2)
Sodium: 135 mmol/L (ref 134–144)

## 2018-11-27 MED ORDER — FUROSEMIDE 40 MG PO TABS: 40.0000 mg | ORAL_TABLET | Freq: Every day | ORAL | 3 refills | Status: DC

## 2018-11-27 NOTE — Patient Instructions (Signed)
Medication Instructions:  1.) STOP: Amlodipine   2.) DECREASE: Furosemide ( Lasix ) to 40 mg daily ( you may take a extra dose if you have increase weight gain or swelling )  If you need a refill on your cardiac medications before your next appointment, please call your pharmacy.   Lab work: TODAY: BMET   If you have labs (blood work) drawn today and your tests are completely normal, you will receive your results only by: Marland Kitchen MyChart Message (if you have MyChart) OR . A paper copy in the mail If you have any lab test that is abnormal or we need to change your treatment, we will call you to review the results.  Testing/Procedures: None  Follow-Up: At [facility], you and your health needs are our priority.  As part of our continuing mission to provide you with exceptional heart care, we have created designated Provider Care Teams.  These Care Teams include your primary Cardiologist (physician) and Advanced Practice Providers (APPs -  Physician Assistants and Nurse Practitioners) who all work together to provide you with the care you need, when you need it. You will need a follow up appointment in:  2 months.  You may see Dorris Carnes, MD or one of the following Advanced Practice Providers on your designated Care Team: Richardson Dopp, PA-C [place], [place] . Daune Perch, NP  Any Other Special Instructions Will Be Listed Below (If Applicable).         Daily Weight Record   It is important to weigh yourself daily. To do this:  Make sure you use a reliable scale. Use the same scale each day.  Keep this daily weight chart near your scale.  Weigh yourself each morning at the same time.  Before weighing yourself: ? Take off your shoes. ? Make sure you are wearing the same amount of clothing each day.  Write down your weight in the spaces on the form.  Compare today's weight to yesterday's weight.  Bring this form with you to your follow-up visits with your health care  provider. Call your health care provider if you have concerns about your weight, including rapid weight gain or loss.    Date: ________ Weight: __________________    Date: ______ Weight: ____________________ Date: ________ Weight: ____________________ Date: ________ Weight: ____________________ Date: ________ Weight: ____________________ Date: ________ Weight: ____________________ Date: ________ Weight: ____________________ Date: ________ Weight: ____________________ Date: ________ Weight: ____________________ Date: ________ Weight: ____________________ Date: ________ Weight: ____________________ Date: ________ Weight: ____________________ Date: ________ Weight: ____________________ Date: ________ Weight: ____________________ Date: ________ Weight: ____________________ Date: ________ Weight: ____________________ Date: ________ Weight: ____________________ Date: ________ Weight: ____________________ Date: ________ Weight: ____________________ Date: ________ Weight: ____________________ Date: ________ Weight: ____________________ Date: ________ Weight: ____________________ Date: ________ Weight: ____________________ Date: ________ Weight: ____________________ Date: ________ Weight: ____________________ Date: ________ Weight: ____________________ Date: ________ Weight: ____________________ Date: ________ Weight: ____________________ Date: ________ Weight: ____________________ Date: ________ Weight: ____________________ Date: ________ Weight: ____________________ Date: ________ Weight: ____________________ Date: ________ Weight: ____________________ Date: ________ Weight: ____________________ Date: ________ Weight: ____________________ Date: ________ Weight: ____________________ Date: ________ Weight: ____________________ Date: ________ Weight: ____________________ Date: ________ Weight: ____________________ Date: ________ Weight: ____________________ Date: ________ Weight: ____________________  Date: ________ Weight: ____________________ Date: ________ Weight: ____________________ Date: ________ Weight: ____________________ Date: ________ Weight: ____________________ Date: ________ Weight: ____________________ Date: ________ Weight: ____________________ Date: ________ Weight: ____________________ Date: ________ Weight: ____________________ Date: ________ Weight: ____________________ This information is not intended to replace advice given to you by your health care provider. Make sure  you discuss any questions you have with your health care provider. Document Released: 01/16/2007 Document Revised: 11/03/2017 Document Reviewed: 11/03/2017 Elsevier Interactive Patient Education  2019 [employer].

## 2018-11-27 NOTE — Progress Notes (Signed)
Cardiology Office Note:    Date:  11/27/2018   ID:  Chad Mcmillan, DOB 09-May-1924, MRN [id number]  PCP:  Chad Rail, MD  Cardiologist:  Chad Carnes, MD  Referring MD: Chad Rail, MD   Chief Complaint  Patient presents with  . Follow-up    CHF    History of Present Illness:    Chad Mcmillan is a 85 y.o. male with a past medical history significant for  CAD with remote CABG,  diastolic CHF, diabetes mellitus, chronic kidney disease stage IV complicated by anemia, hypertension, mild thrombocytopenia and early dementia.  He was last seen in the office on 09/01/2018 by Dr. Harrington Mcmillan at which time he was having shortness of breath but no chest pain.  He appeared volume overloaded.  NT proBNP was 20,000.  The patient was given extra Lasix and then daily Lasix 60 mg.  Abs were followed for several weeks with slight improvements.  He was also noted to be anemic with hemoglobin of 7.8, iron was added.  Most recent 11 was 9.1 on 09/28/2018.  Chad Mcmillan is here with his son. He is using a walker. He still has a wound on his right heal being treated at wound care center. His wt is down significantly and his LE edema is resolved. He lifts his shirt and shows me the loose skin on his abdomen.   He lives with his 89 year old wife and they have people come in to help them. They eat a ham sandwich for lunch (1 slice) and frozen meals at dinner.   He has mild DOE with walking through his house, pushing his walker that is hard to roll over the carpet. He says it doesn't last long. No chest pain/pressure/tightness. He denies lightheadedness or dizziness since his fall in September.   Past Medical History:  Diagnosis Date  . ADENOCARCINOMA, PROSTATE 06/01/2008  . CORONARY ARTERY DISEASE 06/13/2007  . CVA 06/01/2008   x 2  . DIABETES MELLITUS, TYPE I, WITH OPHTHALMIC COMPLICATIONS 4/48/1856  . 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 HYPERKALEMIA 06/01/2008   due to DM neparopathy  . HYPERLIPIDEMIA 06/13/2007  . HYPERTENSION  06/13/2007  . NEOPLASM, MALIGNANT, SKIN, FACE 06/01/2008  . Other chronic nonalcoholic liver disease 01/29/9701  . RENAL DISEASE 06/01/2008    Past Surgical History:  Procedure Laterality Date  . Carotid Duplex  09/12/2004  . CATARACT EXTRACTION    . CHOLECYSTECTOMY    . ESOPHAGOGASTRODUODENOSCOPY  06/28/1991  . FEMUR IM NAIL Right 07/17/2018  . HERNIA REPAIR     right Inguinal hernia  . INTRAMEDULLARY (IM) NAIL INTERTROCHANTERIC N/A 07/17/2018   Procedure: INTRAMEDULLARY (IM) NAIL INTERTROCHANTRIC;  Surgeon: Chad Koyanagi, MD;  Location: [facility];  Service: Orthopedics;  Laterality: N/A;  . Rest/Stress Cardiolite  01/22/2001    Current Medications: Current Meds  Medication Sig  . aspirin EC 81 MG tablet Take 1 tablet (81 mg total) by mouth daily.  Marland Kitchen atorvastatin (LIPITOR) 40 MG tablet Take 2 tablets (80 mg total) by mouth daily at 6 PM.  . calcium-vitamin D (OSCAL 500/200 D-3) 500-200 MG-UNIT tablet Take 1 tab TID x 6 months  . donepezil (ARICEPT) 5 MG tablet Take 1 tablet (5 mg total) by mouth at bedtime.  Marland Kitchen FERROUS GLUCONATE PO Take 300 mg by mouth daily.  . furosemide (LASIX) 40 MG tablet Take 1 tablet (40 mg total) by mouth daily. You may take and extra dose for increase weight gain or for swelling  .  HYDROcodone-acetaminophen (NORCO) 7.5-325 MG tablet Take 1-2 tablets by mouth every 6 (six) hours as needed for moderate pain.  Marland Kitchen lisinopril (PRINIVIL,ZESTRIL) 5 MG tablet Take 5 mg by mouth daily.  Marland Kitchen LORazepam (ATIVAN) 0.5 MG tablet Take 1 tablet (0.5 mg total) by mouth at bedtime as needed for anxiety.  . meclizine (ANTIVERT) 12.5 MG tablet TAKE 1 TABLET BY MOUTH THREE TIMES DAILY AS NEEDED FOR DIZZINESS  . Multiple Vitamins-Minerals (DECUBI-VITE PO) Take 1 capsule by mouth daily.  . nitroGLYCERIN (NITROSTAT) 0.4 MG SL tablet Place 1 tablet (0.4 mg total) under the tongue every 5 (five) minutes as needed. For chest pain  . polyethylene glycol (MIRALAX / GLYCOLAX) packet Take 17 g by  mouth daily.  . repaglinide (PRANDIN) 0.5 MG tablet Take 1 tablet (0.5 mg total) by mouth 3 (three) times daily before meals.  . senna-docusate (SENOKOT-S) 8.6-50 MG tablet Take 1 tablet by mouth at bedtime as needed for mild constipation or moderate constipation.  . vitamin B-12 1000 MCG tablet Take 1 tablet (1,000 mcg total) by mouth daily.  . [DISCONTINUED] amLODipine (NORVASC) 5 MG tablet Take 5 mg by mouth daily.  . [DISCONTINUED] furosemide (LASIX) 40 MG tablet Take 1.5 tablets (60 mg total) by mouth daily.     Allergies:   Patient has no known allergies.   Social History   Socioeconomic History  . Marital status: Married    Spouse name: Not on file  . Number of children: Not on file  . Years of education: Not on file  . Highest education level: Not on file  Occupational History  . Occupation: Retired    Fish farm manager: RETIRED  Social Needs  . Financial resource strain: Not on file  . Food insecurity:    Worry: Not on file    Inability: Not on file  . Transportation needs:    Medical: Not on file    Non-medical: Not on file  Tobacco Use  . Smoking status: Former Smoker    Last attempt to quit: 11/18/1978    Years since quitting: 40.0  . Smokeless tobacco: Never Used  Substance and Sexual Activity  . Alcohol use: No  . Drug use: No  . Sexual activity: Not Currently  Lifestyle  . Physical activity:    Days per week: Not on file    Minutes per session: Not on file  . Stress: Not on file  Relationships  . Social connections:    Talks on phone: Not on file    Gets together: Not on file    Attends religious service: Not on file    Active member of club or organization: Not on file    Attends meetings of clubs or organizations: Not on file    Relationship status: Not on file  Other Topics Concern  . Not on file  Social History Narrative  . Not on file     Family History: The patient's family history includes Vision loss in his son. ROS:   Please see the history of  present illness.     All other systems reviewed and are negative.  EKGs/Labs/Other Studies Reviewed:    The following studies were reviewed today:  Echo 07/21/2018 Study Conclusions - Left ventricle: The cavity size was normal. There was mild focal basal hypertrophy of the septum. Systolic function was normal. The estimated ejection fraction was in the range of 50% to 55%. Wall motion was normal; there were no regional wall motion abnormalities. Doppler parameters are consistent with abnormal  left ventricular relaxation (grade 1 diastolic dysfunction). - Aortic valve: There was no significant regurgitation. - Mitral valve: There was trivial regurgitation. - Left atrium: The atrium was mildly to moderately dilated. - Right ventricle: Systolic function was normal. - Right atrium: Central venous pressure (est): 3 mm Hg. - Atrial septum: No defect or patent foramen ovale was identified. - Tricuspid valve: There was trivial regurgitation. - Pulmonic valve: There was no significant regurgitation. - Pericardium, extracardiac: There was a left pleural effusion.  Impressions: - Normal LV systolic function with grade 1 diasolic dysfunction. No significant valvular disease. IVC collapses on respiration, normal CVP.  Echo February 28, 2018 - Left ventricle: The cavity size was normal. There was mild concentric hypertrophy. Systolic function was at the lower limits of normal. The estimated ejection fraction was in the range of 50% to 55%. Possible mild hypokinesis of the basal-midanterolateral myocardium. There was fusion of early and atrial contributions to ventricular filling. The study is not technically sufficient to allow evaluation of LV diastolic function. - Aortic valve: Mild focal calcification involving the noncoronary cusp. Valve area (Vmax): 2.2 cm^2. - Mitral valve: There was mild regurgitation. - Left atrium: The atrium was mildly dilated. -  Pulmonary arteries: Systolic pressure was mildly increased. PA peak pressure: 46 mm Hg (S).   EKG:  EKG is not ordered today.    Recent Labs: 02/28/2018: ALT 16 03/02/2018: Magnesium 2.3 09/01/2018: TSH 5.500 09/28/2018: BUN 48; Creatinine, Ser 2.43; Hemoglobin 9.1; NT-Pro BNP 12,877; Platelets 127; Potassium 5.0; Sodium 140   Recent Lipid Panel    Component Value Date/Time   CHOL 106 02/28/2018 0237   TRIG 88 02/28/2018 0237   TRIG 51 09/29/2006 0801   HDL 42 02/28/2018 0237   CHOLHDL 2.5 02/28/2018 0237   VLDL 18 02/28/2018 0237   LDLCALC 46 02/28/2018 0237    Physical Exam:    VS:  BP (!) 90/40   Pulse 85   Ht 6' (1.829 m)   Wt 147 lb 12.8 oz (67 kg)   SpO2 99%   BMI 20.05 kg/m     Wt Readings from Last 3 Encounters:  11/27/18 147 lb 12.8 oz (67 kg)  09/15/18 167 lb (75.8 kg)  09/01/18 185 lb (83.9 kg)     Physical Exam  Constitutional: He is oriented to person, place, and time.  Frail, elderly male, walking with walker  HENT:  Head: Normocephalic and atraumatic.  Neck: Normal range of motion. Neck supple. No JVD present.  Cardiovascular: Normal rate, regular rhythm, normal heart sounds and intact distal pulses. Exam reveals no gallop and no friction rub.  No murmur heard. Pulmonary/Chest: Effort normal and breath sounds normal. No respiratory distress. He has no wheezes. He has no rales.  Abdominal: Soft. Bowel sounds are normal.  Musculoskeletal:        General: No edema.     Comments: Right foot with dressing and protective shoe  Neurological: He is alert and oriented to person, place, and time.  Skin: Skin is warm and dry.  Psychiatric: He has a normal mood and affect. His behavior is normal. Thought content normal.  Vitals reviewed.   ASSESSMENT:    1. Essential hypertension   2. Acute diastolic heart failure (HCC)   3. Paroxysmal atrial fibrillation with RVR ([place])   4. Dyslipidemia   5. Coronary artery disease involving native coronary artery  of native heart without angina pectoris    PLAN:    In order of problems listed above:  1. Diastolic heart failure -Normal LV systolic funtion on echo in 07/2018 -Patient has been volume overloaded since a fall with hip fracture in September. He has been diuresed with lasix and he is now down 38 lbs. His LE edema is resolved. He was taking lasix 80 mg daily. -Today euvolemic and likely a little dry. BP low but asymptomatic.  -Decrease lasix to 40 mg daily with extra dose as needed for increased wt and or swelling.  -Low sodium diet. Discussed trying to choose the lower sodium frozen meals.  -CHF zones handout given with instructions on wt monitoring and when to call office. Wt chart provided.  -Will check BMet, if appears significantly dehydrated I may decrease his lasix more.   2. PAF: no clear recurrences. Not anticoagulated due to anemia, frailty and falls.  3. Hypertension -BP is low today. No lightheadedness. 90/40 and 98/40 after sitting.  -Will stop amlodipine and decrease lasix.  -Safety precautions reviewed.  -Pt advised to drink water today and try to get at least 6 cups of liquid per day. Per his son he drinks a lot of diet coke. He agrees to limit the soda to one can per day.   4. HLD: continue pravastatin  5. CAD: no angina.   Medication Adjustments/Labs and Tests Ordered: Current medicines are reviewed at length with the patient today.  Concerns regarding medicines are outlined above. Labs and tests ordered and medication changes are outlined in the patient instructions below:  Patient Instructions  Medication Instructions:  1.) STOP: Amlodipine   2.) DECREASE: Furosemide ( Lasix ) to 40 mg daily ( you may take a extra dose if you have increase weight gain or swelling )  If you need a refill on your cardiac medications before your next appointment, please call your pharmacy.   Lab work: TODAY: BMET   If you have labs (blood work) drawn today and your tests are  completely normal, you will receive your results only by: Marland Kitchen MyChart Message (if you have MyChart) OR . A paper copy in the mail If you have any lab test that is abnormal or we need to change your treatment, we will call you to review the results.  Testing/Procedures: None  Follow-Up: At [facility], you and your health needs are our priority.  As part of our continuing mission to provide you with exceptional heart care, we have created designated Provider Care Teams.  These Care Teams include your primary Cardiologist (physician) and Advanced Practice Providers (APPs -  Physician Assistants and Nurse Practitioners) who all work together to provide you with the care you need, when you need it. You will need a follow up appointment in:  2 months.  You may see Chad Carnes, MD or one of the following Advanced Practice Providers on your designated Care Team: Richardson Dopp, PA-C [place], [place] . Daune Perch, NP  Any Other Special Instructions Will Be Listed Below (If Applicable).         Daily Weight Record   It is important to weigh yourself daily. To do this:  Make sure you use a reliable scale. Use the same scale each day.  Keep this daily weight chart near your scale.  Weigh yourself each morning at the same time.  Before weighing yourself: ? Take off your shoes. ? Make sure you are wearing the same amount of clothing each day.  Write down your weight in the spaces on the form.  Compare today's weight to yesterday's weight.  Bring this form with you to your follow-up visits with your health care provider. Call your health care provider if you have concerns about your weight, including rapid weight gain or loss.    Date: ________ Weight: __________________    Date: ______ Weight: ____________________ Date: ________ Weight: ____________________ Date: ________ Weight: ____________________ Date: ________ Weight: ____________________ Date: ________ Weight:  ____________________ Date: ________ Weight: ____________________ Date: ________ Weight: ____________________ Date: ________ Weight: ____________________ Date: ________ Weight: ____________________ Date: ________ Weight: ____________________ Date: ________ Weight: ____________________ Date: ________ Weight: ____________________ Date: ________ Weight: ____________________ Date: ________ Weight: ____________________ Date: ________ Weight: ____________________ Date: ________ Weight: ____________________ Date: ________ Weight: ____________________ Date: ________ Weight: ____________________ Date: ________ Weight: ____________________ Date: ________ Weight: ____________________ Date: ________ Weight: ____________________ Date: ________ Weight: ____________________ Date: ________ Weight: ____________________ Date: ________ Weight: ____________________ Date: ________ Weight: ____________________ Date: ________ Weight: ____________________ Date: ________ Weight: ____________________ Date: ________ Weight: ____________________ Date: ________ Weight: ____________________ Date: ________ Weight: ____________________ Date: ________ Weight: ____________________ Date: ________ Weight: ____________________ Date: ________ Weight: ____________________ Date: ________ Weight: ____________________ Date: ________ Weight: ____________________ Date: ________ Weight: ____________________ Date: ________ Weight: ____________________ Date: ________ Weight: ____________________ Date: ________ Weight: ____________________ Date: ________ Weight: ____________________ Date: ________ Weight: ____________________ Date: ________ Weight: ____________________ Date: ________ Weight: ____________________ Date: ________ Weight: ____________________ Date: ________ Weight: ____________________ Date: ________ Weight: ____________________ Date: ________ Weight: ____________________ Date: ________ Weight: ____________________ Date: ________  Weight: ____________________ This information is not intended to replace advice given to you by your health care provider. Make sure you discuss any questions you have with your health care provider. Document Released: 01/16/2007 Document Revised: 11/03/2017 Document Reviewed: 11/03/2017 Elsevier Interactive Patient Education  2019 [place], Daune Perch, NP  11/27/2018 11:29 AM    [facility]

## 2018-11-28 ENCOUNTER — Telehealth: Payer: Self-pay | Admitting: Cardiology

## 2018-11-28 LAB — AEROBIC CULTURE W GRAM STAIN (SUPERFICIAL SPECIMEN)

## 2018-11-28 LAB — AEROBIC CULTURE  (SUPERFICIAL SPECIMEN)

## 2018-11-28 NOTE — Telephone Encounter (Signed)
I called the patient regarding his labs and med change but he could not understand as his on fixes his meds. I called his son and spoke to his daughter in law. His labs indicate that he is very dehydrated. I asked her to stop his lasix altogether and hold lisinopril for now. I will call him next week and have labs rechecked. His daughter in law will go to his house and fix his meds.

## 2018-11-30 ENCOUNTER — Telehealth: Payer: Self-pay | Admitting: *Deleted

## 2018-11-30 DIAGNOSIS — E11621 Type 2 diabetes mellitus with foot ulcer: Secondary | ICD-10-CM | POA: Diagnosis not present

## 2018-11-30 DIAGNOSIS — I5031 Acute diastolic (congestive) heart failure: Secondary | ICD-10-CM

## 2018-11-30 DIAGNOSIS — N184 Chronic kidney disease, stage 4 (severe): Secondary | ICD-10-CM

## 2018-11-30 NOTE — Telephone Encounter (Signed)
Notes recorded by [name], MD on 11/29/2018 at 11:35 PM EST Note pt's labs addresed by Buren Kos yesteday Lasix and lisinopril held  Will need BMET and BNP on Wednesday   Spoke to patient's son and informed. Also confirmed that they have taken lasix and lisinopril out of pill box as well as amlodipine - which he was instructed to stop at OV due to hypotension.  Son states he fell yesterday when he stood up to put his coat on, no injury.  Adv to remind him to change positions slowly and try to drink adequate amount of fluids daily.

## 2018-12-01 ENCOUNTER — Other Ambulatory Visit: Payer: Self-pay | Admitting: Internal Medicine

## 2018-12-01 NOTE — Telephone Encounter (Signed)
Copied from [facility] [id number]. Topic: Quick Communication - Rx Refill/Question >> Dec 01, 2018  4:30 PM Reyne Dumas L wrote: Medication: LORazepam (ATIVAN) 0.5 MG tablet  Has the patient contacted their pharmacy? Yes - pharmacy states that they must get new script from physician (Agent: If no, request that the patient contact the pharmacy for the refill.) (Agent: If yes, when and what did the pharmacy advise?)  Preferred Pharmacy (with phone number or street name): [facility] [address] [place] - [place] [address] [phone] (Phone) [phone] (Fax)  Agent: Please be advised that RX refills may take up to 3 business days. We ask that you follow-up with your pharmacy.

## 2018-12-02 ENCOUNTER — Ambulatory Visit: Payer: Medicare Other | Admitting: Internal Medicine

## 2018-12-02 ENCOUNTER — Other Ambulatory Visit: Payer: Medicare Other | Admitting: *Deleted

## 2018-12-02 DIAGNOSIS — I5031 Acute diastolic (congestive) heart failure: Secondary | ICD-10-CM

## 2018-12-02 DIAGNOSIS — N184 Chronic kidney disease, stage 4 (severe): Secondary | ICD-10-CM

## 2018-12-02 MED ORDER — LORAZEPAM 0.5 MG PO TABS: 0.5000 mg | ORAL_TABLET | Freq: Every evening | ORAL | 0 refills | Status: DC | PRN

## 2018-12-03 ENCOUNTER — Telehealth: Payer: Self-pay | Admitting: Student

## 2018-12-03 ENCOUNTER — Telehealth: Payer: Self-pay

## 2018-12-03 DIAGNOSIS — E875 Hyperkalemia: Secondary | ICD-10-CM

## 2018-12-03 DIAGNOSIS — Z79899 Other long term (current) drug therapy: Secondary | ICD-10-CM

## 2018-12-03 DIAGNOSIS — I5031 Acute diastolic (congestive) heart failure: Secondary | ICD-10-CM

## 2018-12-03 LAB — BASIC METABOLIC PANEL
BUN/Creatinine Ratio: 26 — ABNORMAL HIGH (ref 10–24)
BUN: 71 mg/dL — ABNORMAL HIGH (ref 10–36)
CO2: 18 mmol/L — ABNORMAL LOW (ref 20–29)
Calcium: 8.7 mg/dL (ref 8.6–10.2)
Chloride: 111 mmol/L — ABNORMAL HIGH (ref 96–106)
Creatinine, Ser: 2.71 mg/dL — ABNORMAL HIGH (ref 0.76–1.27)
GFR calc Af Amer: 23 mL/min/{1.73_m2} — ABNORMAL LOW (ref >59)
GFR calc non Af Amer: 19 mL/min/{1.73_m2} — ABNORMAL LOW (ref >59)
Glucose: 156 mg/dL — ABNORMAL HIGH (ref 65–99)
Potassium: 6.2 mmol/L (ref 3.5–5.2)
SODIUM: 145 mmol/L — AB (ref 134–144)

## 2018-12-03 LAB — PRO B NATRIURETIC PEPTIDE: NT-Pro BNP: 11875 pg/mL — ABNORMAL HIGH (ref 0–486)

## 2018-12-03 MED ORDER — SODIUM POLYSTYRENE SULFONATE PO POWD: Freq: Once | ORAL | 0 refills | Status: AC

## 2018-12-03 NOTE — Telephone Encounter (Signed)
   Received sign off from overnight fellow who received critical lab page that patient's potassium was 6.2. Per chart review, serum creatinine and potassium having been increasing from 11/27/2018. Thought to be possibly due to dehydration. Called and spoke with patient's son Louie Casa. Son said he has been trying to get his father to drink more fluids but has not been very successful. Advised him that his father should come to the emergency department to monitor labs and for possible IV fluids. Son said that his father is not going to want to do that. Re-emphasized the importance of monitoring the labs and again recommended patient come to the emergency department this morning. Patient's son said he would try his best and would call our office if his father refused.  Darreld Mclean, PA-C 12/03/2018 7:47 AM

## 2018-12-03 NOTE — Telephone Encounter (Signed)
Spoke with the pt re: his abnormal BMET and to follow up from the message taken by Sande Rives PA from the lab re: elevated K..  Pt has refused to go to the ER.. he was very upset on the phone and says that he will not go because he needs to take care of his sick 83 year old wife.. I reviewed his labs with him and explained the importance of treatment and careful monitoring.. pt says that he will not go and to stop asking him.. he says his family has already been trying to make him go and he will not..  I spoke with Dr. Burt Knack (DOD) and he advised that since the pt will not go to the ER for the optimal treatment... we will call in Kayexalate and to have him return on  Monday 12/07/18 for repeat labs.Damaris Schooner with the patients son, Louie Casa and he verbalized understanding but requested that I speak with his other brother, [name], who is at the house helping to manage his parents care today.  LMTCB for [name].Marland KitchenMarland Kitchen

## 2018-12-04 ENCOUNTER — Telehealth: Payer: Self-pay | Admitting: Cardiology

## 2018-12-04 NOTE — Telephone Encounter (Signed)
I called pt's son Louie Casa to check on pt's condition. He says that the patient did stop the lasix. They are trying to get him to drink more fluids, but he is not drinking much. The patient is very resistant to going to the hospital. I discussed that the patient's kidneys are failing and he needs fluids. Louie Casa says that he will try and understands the seriousness of the situation.

## 2018-12-07 ENCOUNTER — Other Ambulatory Visit: Payer: Medicare Other

## 2018-12-07 DIAGNOSIS — E11621 Type 2 diabetes mellitus with foot ulcer: Secondary | ICD-10-CM | POA: Diagnosis not present

## 2018-12-13 DIAGNOSIS — E559 Vitamin D deficiency, unspecified: Secondary | ICD-10-CM | POA: Insufficient documentation

## 2018-12-13 DIAGNOSIS — E538 Deficiency of other specified B group vitamins: Secondary | ICD-10-CM | POA: Insufficient documentation

## 2018-12-13 NOTE — Patient Instructions (Addendum)
  Tests ordered today. Your results will be released to [place] (or called to you) after review, usually within 72hours after test completion. If any changes need to be made, you will be notified at that same time.   Medications reviewed and updated.  Changes include :   none    Please followup in 4 months

## 2018-12-13 NOTE — Progress Notes (Signed)
Subjective:    Patient ID: Chad Mcmillan, male    DOB: 06-04-1924, 85 y.o.   MRN: [id number]  HPI The patient is here for follow up.  He is here with his son.  Diabetes:  He is taking his medication daily as prescribed. He is fairly compliant with a diabetic diet. He is not exercising regularly.   Anemia: He is taking iron every other day because it was causing too much constipation.  He states his stools are black.  His stools are regular and he denies any diarrhea constipation.  B12 def:  He is taking oral B12 daily.    Vitamin d def:  He is taking calcium and vitamin d daily and a MVI.    CAD, Hypertension: He is taking his medication daily. He is compliant with a low sodium diet.  He has chronic shortness of breath and it has not worsened over the past 5 months.  He denies chest pain, palpitations, edema, and regular headaches. He is not exercising regularly.    CKD, stage 4, HFpEF: He follows with cardiology.  His potassium has been elevated and he has not wanted to be evaluated in the emergency room.  He is not currently following with nephrology.  His son has noted that his weight has increased.  He denies any significant swelling in the ankles.  He states that shortness of breath is chronic and has not worsened recently.  Dementia, agitation: He is at home with his wife.  His family helps considerably and they have a home health aide coming in 3 hours every morning.  He does take the lorazepam at night and that helps.     Medications and allergies reviewed with patient and updated if appropriate.  Patient Active Problem List   Diagnosis Date Noted  . Vitamin B12 deficiency 12/13/2018  . Vitamin D deficiency 12/13/2018  . Paroxysmal atrial fibrillation with RVR ([place])   . Chronic diastolic heart failure ([place])   . S/P CABG (coronary artery bypass graft)   . Closed fracture of acromial end of left clavicle 07/19/2018    Class: Acute  . Closed displaced fracture of greater  trochanter of right femur ([place]) 07/17/2018  . History of thrombocytopenia 07/17/2018  . Hypocalcemia 02/12/2018  . Agitation   . Dementia ([place])   . CKD (chronic kidney disease), stage IV ([place]) 01/14/2018  . Inguinal hernia of right side without obstruction or gangrene 01/12/2018  . Vertigo 01/07/2018  . LBBB (left bundle branch block) 01/07/2018  . Generalized weakness 01/07/2018  . Foot pain, bilateral 10/07/2017  . Dizzy 01/31/2016  . Pancytopenia ([place]) 07/27/2012  . DM (diabetes mellitus) ([place]) 07/27/2012  . Anemia 07/27/2012  . NASH (nonalcoholic steatohepatitis) 07/17/2010  . ADENOCARCINOMA, PROSTATE 06/01/2008  . History of stroke 06/01/2008  . NEOPLASM, MALIGNANT, SKIN, FACE 06/01/2008  . Dyslipidemia 06/13/2007  . Essential hypertension 06/13/2007  . CAD (coronary artery disease) 06/13/2007    Current Outpatient Medications on File Prior to Visit  Medication Sig Dispense Refill  . aspirin EC 81 MG tablet Take 1 tablet (81 mg total) by mouth daily. 90 tablet 3  . atorvastatin (LIPITOR) 40 MG tablet Take 2 tablets (80 mg total) by mouth daily at 6 PM. 60 tablet 4  . calcium-vitamin D (OSCAL 500/200 D-3) 500-200 MG-UNIT tablet Take 1 tab TID x 6 months 90 tablet 3  . donepezil (ARICEPT) 5 MG tablet Take 1 tablet (5 mg total) by mouth at bedtime. 30 tablet 11  .  FERROUS GLUCONATE PO Take 300 mg by mouth daily.    Marland Kitchen HYDROcodone-acetaminophen (NORCO) 7.5-325 MG tablet Take 1-2 tablets by mouth every 6 (six) hours as needed for moderate pain. 30 tablet 0  . LORazepam (ATIVAN) 0.5 MG tablet Take 1 tablet (0.5 mg total) by mouth at bedtime as needed for anxiety. 30 tablet 0  . meclizine (ANTIVERT) 12.5 MG tablet TAKE 1 TABLET BY MOUTH THREE TIMES DAILY AS NEEDED FOR DIZZINESS 30 tablet 3  . Multiple Vitamins-Minerals (DECUBI-VITE PO) Take 1 capsule by mouth daily.    . nitroGLYCERIN (NITROSTAT) 0.4 MG SL tablet Place 1 tablet (0.4 mg total) under the tongue every 5 (five) minutes as  needed. For chest pain 25 tablet 3  . polyethylene glycol (MIRALAX / GLYCOLAX) packet Take 17 g by mouth daily.    . repaglinide (PRANDIN) 0.5 MG tablet Take 1 tablet (0.5 mg total) by mouth 3 (three) times daily before meals. 270 tablet 3  . senna-docusate (SENOKOT-S) 8.6-50 MG tablet Take 1 tablet by mouth at bedtime as needed for mild constipation or moderate constipation.    . vitamin B-12 1000 MCG tablet Take 1 tablet (1,000 mcg total) by mouth daily. 30 tablet 1   No current facility-administered medications on file prior to visit.     Past Medical History:  Diagnosis Date  . ADENOCARCINOMA, PROSTATE 06/01/2008  . CORONARY ARTERY DISEASE 06/13/2007  . CVA 06/01/2008   x 2  . DIABETES MELLITUS, TYPE I, WITH OPHTHALMIC COMPLICATIONS 8/78/6767  . DM retinopathy ([place])   . HYPERKALEMIA 06/01/2008   due to DM neparopathy  . HYPERLIPIDEMIA 06/13/2007  . HYPERTENSION 06/13/2007  . NEOPLASM, MALIGNANT, SKIN, FACE 06/01/2008  . Other chronic nonalcoholic liver disease 12/28/4707  . RENAL DISEASE 06/01/2008    Past Surgical History:  Procedure Laterality Date  . Carotid Duplex  09/12/2004  . CATARACT EXTRACTION    . CHOLECYSTECTOMY    . ESOPHAGOGASTRODUODENOSCOPY  06/28/1991  . FEMUR IM NAIL Right 07/17/2018  . HERNIA REPAIR     right Inguinal hernia  . INTRAMEDULLARY (IM) NAIL INTERTROCHANTERIC N/A 07/17/2018   Procedure: INTRAMEDULLARY (IM) NAIL INTERTROCHANTRIC;  Surgeon: Leandrew Koyanagi, MD;  Location: [facility];  Service: Orthopedics;  Laterality: N/A;  . Rest/Stress Cardiolite  01/22/2001    Social History   Socioeconomic History  . Marital status: Married    Spouse name: Not on file  . Number of children: Not on file  . Years of education: Not on file  . Highest education level: Not on file  Occupational History  . Occupation: Retired    Fish farm manager: RETIRED  Social Needs  . Financial resource strain: Not on file  . Food insecurity:    Worry: Not on file    Inability: Not on  file  . Transportation needs:    Medical: Not on file    Non-medical: Not on file  Tobacco Use  . Smoking status: Former Smoker    Last attempt to quit: 11/18/1978    Years since quitting: 40.0  . Smokeless tobacco: Never Used  Substance and Sexual Activity  . Alcohol use: No  . Drug use: No  . Sexual activity: Not Currently  Lifestyle  . Physical activity:    Days per week: Not on file    Minutes per session: Not on file  . Stress: Not on file  Relationships  . Social connections:    Talks on phone: Not on file    Gets together: Not on file  Attends religious service: Not on file    Active member of club or organization: Not on file    Attends meetings of clubs or organizations: Not on file    Relationship status: Not on file  Other Topics Concern  . Not on file  Social History Narrative  . Not on file    Family History  Problem Relation Age of Onset  . Vision loss Son     Review of Systems  Constitutional: Negative for appetite change (eating well), chills and fever.  Respiratory: Positive for shortness of breath (with exertion). Negative for cough and wheezing.   Cardiovascular: Negative for chest pain, palpitations and leg swelling.  Gastrointestinal: Negative for abdominal pain, constipation and diarrhea.       Black - taking iron  Neurological: Negative for light-headedness and headaches.       Objective:   Vitals:   12/14/18 1332  BP: 138/68  Pulse: (!) 112  Resp: 18  Temp: (!) 97.5 F (36.4 C)  SpO2: 99%   BP Readings from Last 3 Encounters:  12/14/18 138/68  11/27/18 (!) 90/40  09/15/18 (!) 126/58   Wt Readings from Last 3 Encounters:  12/14/18 160 lb 6.4 oz (72.8 kg)  11/27/18 147 lb 12.8 oz (67 kg)  09/15/18 167 lb (75.8 kg)   Body mass index is 21.75 kg/m.   Physical Exam    Constitutional: Appears well-developed and well-nourished. No distress.  HENT:  Head: Normocephalic and atraumatic.  Neck: Neck supple. No tracheal deviation  present. No thyromegaly present.  No cervical lymphadenopathy Cardiovascular: Normal rate, regular rhythm and normal heart sounds.   No murmur heard. 2 plus right ankle pitting edema, mild LLE edema Pulmonary/Chest: Effort normal and breath sounds normal. No respiratory distress. No has no wheezes. No rales. Abdomen: Soft, nontender, nondistended Skin: Skin is warm and dry. Not diaphoretic.  Psychiatric: Normal mood and affect. Behavior is normal.      Assessment & Plan:    See Problem List for Assessment and Plan of chronic medical problems.

## 2018-12-14 ENCOUNTER — Telehealth: Payer: Self-pay

## 2018-12-14 ENCOUNTER — Telehealth: Payer: Self-pay | Admitting: Internal Medicine

## 2018-12-14 ENCOUNTER — Other Ambulatory Visit (INDEPENDENT_AMBULATORY_CARE_PROVIDER_SITE_OTHER): Payer: Medicare Other

## 2018-12-14 ENCOUNTER — Ambulatory Visit (INDEPENDENT_AMBULATORY_CARE_PROVIDER_SITE_OTHER): Payer: Medicare Other | Admitting: Internal Medicine

## 2018-12-14 ENCOUNTER — Encounter: Payer: Self-pay | Admitting: Internal Medicine

## 2018-12-14 VITALS — BP 138/68 | HR 112 | Temp 97.5°F | Resp 18 | Ht 72.0 in | Wt 160.4 lb

## 2018-12-14 DIAGNOSIS — N183 Chronic kidney disease, stage 3 unspecified: Secondary | ICD-10-CM

## 2018-12-14 DIAGNOSIS — D649 Anemia, unspecified: Secondary | ICD-10-CM | POA: Diagnosis not present

## 2018-12-14 DIAGNOSIS — E559 Vitamin D deficiency, unspecified: Secondary | ICD-10-CM

## 2018-12-14 DIAGNOSIS — E649 Sequelae of unspecified nutritional deficiency: Secondary | ICD-10-CM | POA: Diagnosis not present

## 2018-12-14 DIAGNOSIS — I5032 Chronic diastolic (congestive) heart failure: Secondary | ICD-10-CM | POA: Diagnosis not present

## 2018-12-14 DIAGNOSIS — N184 Chronic kidney disease, stage 4 (severe): Secondary | ICD-10-CM

## 2018-12-14 DIAGNOSIS — E1122 Type 2 diabetes mellitus with diabetic chronic kidney disease: Secondary | ICD-10-CM

## 2018-12-14 DIAGNOSIS — F0390 Unspecified dementia without behavioral disturbance: Secondary | ICD-10-CM

## 2018-12-14 DIAGNOSIS — E538 Deficiency of other specified B group vitamins: Secondary | ICD-10-CM

## 2018-12-14 DIAGNOSIS — E785 Hyperlipidemia, unspecified: Secondary | ICD-10-CM

## 2018-12-14 DIAGNOSIS — R451 Restlessness and agitation: Secondary | ICD-10-CM

## 2018-12-14 DIAGNOSIS — I2510 Atherosclerotic heart disease of native coronary artery without angina pectoris: Secondary | ICD-10-CM

## 2018-12-14 DIAGNOSIS — I10 Essential (primary) hypertension: Secondary | ICD-10-CM

## 2018-12-14 LAB — CBC WITH DIFFERENTIAL/PLATELET
Basophils Absolute: 0 10*3/uL (ref 0.0–0.1)
Basophils Relative: 0.3 % (ref 0.0–3.0)
Eosinophils Absolute: 0 10*3/uL (ref 0.0–0.7)
Eosinophils Relative: 0.7 % (ref 0.0–5.0)
HCT: 29.5 % — ABNORMAL LOW (ref 39.0–52.0)
HEMOGLOBIN: 9.6 g/dL — AB (ref 13.0–17.0)
Lymphocytes Relative: 23.2 % (ref 12.0–46.0)
Lymphs Abs: 1.3 10*3/uL (ref 0.7–4.0)
MCHC: 32.7 g/dL (ref 30.0–36.0)
MCV: 98.7 fl (ref 78.0–100.0)
Monocytes Absolute: 0.3 10*3/uL (ref 0.1–1.0)
Monocytes Relative: 5.9 % (ref 3.0–12.0)
Neutro Abs: 3.9 10*3/uL (ref 1.4–7.7)
Neutrophils Relative %: 69.9 % (ref 43.0–77.0)
Platelets: 105 10*3/uL — ABNORMAL LOW (ref 150.0–400.0)
RBC: 2.99 Mil/uL — ABNORMAL LOW (ref 4.22–5.81)
RDW: 13.8 % (ref 11.5–15.5)
WBC: 5.6 10*3/uL (ref 4.0–10.5)

## 2018-12-14 LAB — COMPREHENSIVE METABOLIC PANEL
ALBUMIN: 3.2 g/dL — AB (ref 3.5–5.2)
ALK PHOS: 157 U/L — AB (ref 39–117)
ALT: 49 U/L (ref 0–53)
AST: 71 U/L — ABNORMAL HIGH (ref 0–37)
BILIRUBIN TOTAL: 0.7 mg/dL (ref 0.2–1.2)
BUN: 46 mg/dL — ABNORMAL HIGH (ref 6–23)
CO2: 20 mEq/L (ref 19–32)
Calcium: 8.4 mg/dL (ref 8.4–10.5)
Chloride: 110 mEq/L (ref 96–112)
Creatinine, Ser: 2.2 mg/dL — ABNORMAL HIGH (ref 0.40–1.50)
GFR: 28.1 mL/min — ABNORMAL LOW (ref >60.00)
Glucose, Bld: 189 mg/dL — ABNORMAL HIGH (ref 70–99)
Potassium: 6.4 mEq/L (ref 3.5–5.1)
Sodium: 139 mEq/L (ref 135–145)
TOTAL PROTEIN: 5.9 g/dL — AB (ref 6.0–8.3)

## 2018-12-14 LAB — LIPID PANEL
Cholesterol: 119 mg/dL (ref 0–200)
HDL: 45.8 mg/dL (ref >39.00)
LDL Cholesterol: 44 mg/dL (ref 0–99)
NonHDL: 73.33
Total CHOL/HDL Ratio: 3
Triglycerides: 148 mg/dL (ref 0.0–149.0)
VLDL: 29.6 mg/dL (ref 0.0–40.0)

## 2018-12-14 LAB — VITAMIN D 25 HYDROXY (VIT D DEFICIENCY, FRACTURES): VITD: 20.22 ng/mL — ABNORMAL LOW (ref 30.00–100.00)

## 2018-12-14 LAB — HEMOGLOBIN A1C: Hgb A1c MFr Bld: 6.5 % (ref 4.6–6.5)

## 2018-12-14 LAB — BRAIN NATRIURETIC PEPTIDE: Pro B Natriuretic peptide (BNP): 2511 pg/mL — ABNORMAL HIGH (ref 0.0–100.0)

## 2018-12-14 LAB — VITAMIN B12: Vitamin B-12: 800 pg/mL (ref 211–911)

## 2018-12-14 NOTE — Telephone Encounter (Signed)
Spoke with son to advise the results. Will talk to pt about going to a kidney specialist and call back.

## 2018-12-14 NOTE — Assessment & Plan Note (Signed)
Check A1c Taking Prandin prior to meals

## 2018-12-14 NOTE — Assessment & Plan Note (Signed)
Check B12 level. 

## 2018-12-14 NOTE — Assessment & Plan Note (Signed)
Check CMP, lipid panel Continue atorvastatin

## 2018-12-14 NOTE — Telephone Encounter (Signed)
New Message   Pt c/o swelling: STAT is pt has developed SOB within 24 hours  1) How much weight have you gained and in what time span? 13lbs in 17 days   2) If swelling, where is the swelling located? no  3) Are you currently taking a fluid pill? No   4) Are you currently SOB? No   5) Do you have a log of your daily weights (if so, list)? NO  6) Have you gained 3 pounds in a day or 5 pounds in a week? Not sure patient hasn't been really weighing himself daily   7) Have you traveled recently? No

## 2018-12-14 NOTE — Telephone Encounter (Signed)
Called patient's son back. He just wanted to update [name] re: weight.  Pt was seen at PCP today.  Labs were drawn. Per son pt has no swelling in legs.  No new tightness around his abdomen. Potassium is high according a phone call they just got from PCP (6.4).  [name] wants to refer him to kidney doctor.  Son is going to talk to patient tonight about going to kidney dr.  [name] is tired of going to doctors. Last week he was instructed to go to hospital for lab values but was resistant to going.  Adv son that I will send this information to [name] to review.  Adv to follow PCP recommendations and if [name] has any new recommendations we will call him back.

## 2018-12-14 NOTE — Telephone Encounter (Signed)
Patient's potassium is very high   Can be dangerous. I agree with kidney doctor referral He is not on any potassium supplements bot K is up Would, until seen again, recomm 12.5 mg chlorathalidone    THis will help fluid and lower K    Get K repeated 2 days Avoid bananas,  OJ   No salt substitutes

## 2018-12-14 NOTE — Assessment & Plan Note (Signed)
Taking Ativan nightly, which has helped His son asked about taking it twice a daily, but I did not advised that because of increased risk of falls

## 2018-12-14 NOTE — Assessment & Plan Note (Signed)
Stage IV chronic kidney disease with elevated potassium He has not wanted to go to the emergency room for the potassium We will recheck CMP today Likely will need to see nephrology to help manage hyperkalemia BNP Increasing edema-may need to go back on diuretic

## 2018-12-14 NOTE — Assessment & Plan Note (Signed)
Furosemide stopped by cardiology Appears to be slightly fluid overloaded-increasing swelling in his legs and has gained weight No change in chronic shortness of breath with exertion His son will call cardiology Check CMP, BNP

## 2018-12-14 NOTE — Assessment & Plan Note (Signed)
Stable Continue Aricept Taking Ativan at bedtime Continue

## 2018-12-14 NOTE — Assessment & Plan Note (Signed)
Blood pressure currently well controlled Not on any antihypertensives Continue to monitor

## 2018-12-14 NOTE — Assessment & Plan Note (Signed)
Taking iron 3 times a week Will check CBC, iron panel Currently on baby aspirin daily

## 2018-12-14 NOTE — Assessment & Plan Note (Signed)
Check vitamin D level 

## 2018-12-14 NOTE — Telephone Encounter (Signed)
Potassium is still high at 6.4.  Would he consider seeing a kidney doctor to help with his potassium and monitor his kidney function?

## 2018-12-14 NOTE — Telephone Encounter (Signed)
Received phone call from [facility] in the lab. Pt has a critical potassium of 6.4. Please advise.

## 2018-12-15 ENCOUNTER — Telehealth: Payer: Self-pay | Admitting: Internal Medicine

## 2018-12-15 DIAGNOSIS — N184 Chronic kidney disease, stage 4 (severe): Secondary | ICD-10-CM

## 2018-12-15 DIAGNOSIS — E875 Hyperkalemia: Secondary | ICD-10-CM

## 2018-12-15 DIAGNOSIS — E11621 Type 2 diabetes mellitus with foot ulcer: Secondary | ICD-10-CM | POA: Diagnosis not present

## 2018-12-15 LAB — IRON,TIBC AND FERRITIN PANEL
%SAT: 40 % (calc) (ref 20–48)
Ferritin: 460 ng/mL — ABNORMAL HIGH (ref 24–380)
Iron: 83 ug/dL (ref 50–180)
TIBC: 209 mcg/dL (calc) — ABNORMAL LOW (ref 250–425)

## 2018-12-15 MED ORDER — CHLORTHALIDONE 25 MG PO TABS: 12.5000 mg | ORAL_TABLET | Freq: Every day | ORAL | 3 refills | Status: DC

## 2018-12-15 NOTE — Telephone Encounter (Signed)
  Son states that at last visit Dr Harrington Challenger wanted to refer Chad Mcmillan to a kidney specialist and he was reluctant to go. The family has finally talked the patient into going so now they would like to have him referred to the specialist.

## 2018-12-15 NOTE — Telephone Encounter (Signed)
Would like referral to kidney specialist

## 2018-12-15 NOTE — Telephone Encounter (Signed)
Reviewed with patient's son Louie Casa. They will get chlorthalidone today and pt will start. Adv [facility] will come out just for lab work on [employer].

## 2018-12-15 NOTE — Telephone Encounter (Signed)
Called patient's son and told him to contact PCP re: referral to renal.

## 2018-12-15 NOTE — Telephone Encounter (Signed)
Follow Up:; ° ° °Returning your call. °

## 2018-12-15 NOTE — Telephone Encounter (Signed)
Copied from [place] [id number]. Topic: Referral - Request for Referral >> Dec 15, 2018  1:45 PM [name] wrote: Has patient seen PCP for this complaint? yes *If NO, is insurance requiring patient see PCP for this issue before PCP can refer them? Referral for which specialty: nephrology/kidney specialist Preferred provider/office: [place] area (nearby is best) Reason for referral: per son pt states that he saw [name] and pt has agreed to renal specialist

## 2018-12-15 NOTE — Telephone Encounter (Signed)
Left message on VM of Lionel Woodberry (son) with instructions to start chlorthalidone 12.5 mg once a day until seen again.  Begin this today if possible. Called [facility], spoke with Lenna Sciara, Midwife. Requested BMET to be drawn on Thurs 12/17/18  with results faxed to [name] [phone]. Adv to avoid salt substitutes, OJ and bananas. Adv to call back with questions/concerns.

## 2018-12-19 ENCOUNTER — Emergency Department (HOSPITAL_COMMUNITY): Payer: Medicare Other

## 2018-12-19 ENCOUNTER — Encounter (HOSPITAL_COMMUNITY): Payer: Self-pay | Admitting: Emergency Medicine

## 2018-12-19 ENCOUNTER — Inpatient Hospital Stay (HOSPITAL_COMMUNITY)
Admission: EM | Admit: 2018-12-19 | Discharge: 2018-12-22 | DRG: 291 | Disposition: A | Discharge status: Home Health | Payer: Medicare Other | Attending: Internal Medicine | Admitting: Internal Medicine

## 2018-12-19 ENCOUNTER — Other Ambulatory Visit: Payer: Self-pay

## 2018-12-19 DIAGNOSIS — I5033 Acute on chronic diastolic (congestive) heart failure: Secondary | ICD-10-CM | POA: Diagnosis not present

## 2018-12-19 DIAGNOSIS — Z87891 Personal history of nicotine dependence: Secondary | ICD-10-CM | POA: Diagnosis not present

## 2018-12-19 DIAGNOSIS — Z951 Presence of aortocoronary bypass graft: Secondary | ICD-10-CM

## 2018-12-19 DIAGNOSIS — Z8673 Personal history of transient ischemic attack (TIA), and cerebral infarction without residual deficits: Secondary | ICD-10-CM

## 2018-12-19 DIAGNOSIS — K7581 Nonalcoholic steatohepatitis (NASH): Secondary | ICD-10-CM | POA: Diagnosis present

## 2018-12-19 DIAGNOSIS — E785 Hyperlipidemia, unspecified: Secondary | ICD-10-CM | POA: Diagnosis present

## 2018-12-19 DIAGNOSIS — I5043 Acute on chronic combined systolic (congestive) and diastolic (congestive) heart failure: Secondary | ICD-10-CM | POA: Diagnosis present

## 2018-12-19 DIAGNOSIS — I5023 Acute on chronic systolic (congestive) heart failure: Secondary | ICD-10-CM | POA: Diagnosis not present

## 2018-12-19 DIAGNOSIS — Z794 Long term (current) use of insulin: Secondary | ICD-10-CM | POA: Diagnosis not present

## 2018-12-19 DIAGNOSIS — E875 Hyperkalemia: Secondary | ICD-10-CM

## 2018-12-19 DIAGNOSIS — E1022 Type 1 diabetes mellitus with diabetic chronic kidney disease: Secondary | ICD-10-CM | POA: Diagnosis present

## 2018-12-19 DIAGNOSIS — I472 Ventricular tachycardia: Secondary | ICD-10-CM | POA: Diagnosis not present

## 2018-12-19 DIAGNOSIS — L89893 Pressure ulcer of other site, stage 3: Secondary | ICD-10-CM | POA: Diagnosis present

## 2018-12-19 DIAGNOSIS — I951 Orthostatic hypotension: Secondary | ICD-10-CM | POA: Diagnosis present

## 2018-12-19 DIAGNOSIS — I340 Nonrheumatic mitral (valve) insufficiency: Secondary | ICD-10-CM | POA: Diagnosis not present

## 2018-12-19 DIAGNOSIS — L89322 Pressure ulcer of left buttock, stage 2: Secondary | ICD-10-CM | POA: Diagnosis present

## 2018-12-19 DIAGNOSIS — Z8546 Personal history of malignant neoplasm of prostate: Secondary | ICD-10-CM

## 2018-12-19 DIAGNOSIS — E10319 Type 1 diabetes mellitus with unspecified diabetic retinopathy without macular edema: Secondary | ICD-10-CM | POA: Diagnosis present

## 2018-12-19 DIAGNOSIS — I509 Heart failure, unspecified: Secondary | ICD-10-CM

## 2018-12-19 DIAGNOSIS — L8990 Pressure ulcer of unspecified site, unspecified stage: Secondary | ICD-10-CM

## 2018-12-19 DIAGNOSIS — D696 Thrombocytopenia, unspecified: Secondary | ICD-10-CM | POA: Diagnosis present

## 2018-12-19 DIAGNOSIS — L89152 Pressure ulcer of sacral region, stage 2: Secondary | ICD-10-CM | POA: Diagnosis present

## 2018-12-19 DIAGNOSIS — I130 Hypertensive heart and chronic kidney disease with heart failure and stage 1 through stage 4 chronic kidney disease, or unspecified chronic kidney disease: Principal | ICD-10-CM | POA: Diagnosis present

## 2018-12-19 DIAGNOSIS — N184 Chronic kidney disease, stage 4 (severe): Secondary | ICD-10-CM | POA: Diagnosis present

## 2018-12-19 DIAGNOSIS — Z9049 Acquired absence of other specified parts of digestive tract: Secondary | ICD-10-CM

## 2018-12-19 DIAGNOSIS — I2510 Atherosclerotic heart disease of native coronary artery without angina pectoris: Secondary | ICD-10-CM | POA: Diagnosis present

## 2018-12-19 DIAGNOSIS — D631 Anemia in chronic kidney disease: Secondary | ICD-10-CM | POA: Diagnosis present

## 2018-12-19 DIAGNOSIS — I4729 Other ventricular tachycardia: Secondary | ICD-10-CM

## 2018-12-19 DIAGNOSIS — Z79899 Other long term (current) drug therapy: Secondary | ICD-10-CM | POA: Diagnosis not present

## 2018-12-19 DIAGNOSIS — Z7982 Long term (current) use of aspirin: Secondary | ICD-10-CM | POA: Diagnosis not present

## 2018-12-19 DIAGNOSIS — I440 Atrioventricular block, first degree: Secondary | ICD-10-CM | POA: Diagnosis not present

## 2018-12-19 DIAGNOSIS — E0800 Diabetes mellitus due to underlying condition with hyperosmolarity without nonketotic hyperglycemic-hyperosmolar coma (NKHHC): Secondary | ICD-10-CM | POA: Diagnosis not present

## 2018-12-19 DIAGNOSIS — I5041 Acute combined systolic (congestive) and diastolic (congestive) heart failure: Secondary | ICD-10-CM | POA: Diagnosis not present

## 2018-12-19 DIAGNOSIS — F0390 Unspecified dementia without behavioral disturbance: Secondary | ICD-10-CM | POA: Diagnosis present

## 2018-12-19 HISTORY — DX: Orthostatic hypotension: I95.1

## 2018-12-19 LAB — CBC WITH DIFFERENTIAL/PLATELET
Abs Immature Granulocytes: 0.01 10*3/uL (ref 0.00–0.07)
Basophils Absolute: 0 10*3/uL (ref 0.0–0.1)
Basophils Relative: 0 %
Eosinophils Absolute: 0 10*3/uL (ref 0.0–0.5)
Eosinophils Relative: 1 %
HEMATOCRIT: 26.9 % — AB (ref 39.0–52.0)
Hemoglobin: 8.1 g/dL — ABNORMAL LOW (ref 13.0–17.0)
Immature Granulocytes: 0 %
LYMPHS ABS: 1 10*3/uL (ref 0.7–4.0)
Lymphocytes Relative: 22 %
MCH: 31.4 pg (ref 26.0–34.0)
MCHC: 30.1 g/dL (ref 30.0–36.0)
MCV: 104.3 fL — ABNORMAL HIGH (ref 80.0–100.0)
Monocytes Absolute: 0.4 10*3/uL (ref 0.1–1.0)
Monocytes Relative: 9 %
Neutro Abs: 3 10*3/uL (ref 1.7–7.7)
Neutrophils Relative %: 68 %
Platelets: DECREASED 10*3/uL (ref 150–400)
RBC: 2.58 MIL/uL — ABNORMAL LOW (ref 4.22–5.81)
RDW: 13.4 % (ref 11.5–15.5)
WBC: 4.5 10*3/uL (ref 4.0–10.5)
nRBC: 0 % (ref 0.0–0.2)

## 2018-12-19 LAB — COMPREHENSIVE METABOLIC PANEL
ALT: 57 U/L — ABNORMAL HIGH (ref 0–44)
ANION GAP: 7 (ref 5–15)
AST: 82 U/L — ABNORMAL HIGH (ref 15–41)
Albumin: 2.6 g/dL — ABNORMAL LOW (ref 3.5–5.0)
Alkaline Phosphatase: 135 U/L — ABNORMAL HIGH (ref 38–126)
BUN: 41 mg/dL — ABNORMAL HIGH (ref 8–23)
CHLORIDE: 117 mmol/L — AB (ref 98–111)
CO2: 18 mmol/L — AB (ref 22–32)
Calcium: 8.2 mg/dL — ABNORMAL LOW (ref 8.9–10.3)
Creatinine, Ser: 2.31 mg/dL — ABNORMAL HIGH (ref 0.61–1.24)
GFR calc Af Amer: 27 mL/min — ABNORMAL LOW (ref >60)
GFR calc non Af Amer: 24 mL/min — ABNORMAL LOW (ref >60)
Glucose, Bld: 157 mg/dL — ABNORMAL HIGH (ref 70–99)
Potassium: 5.7 mmol/L — ABNORMAL HIGH (ref 3.5–5.1)
Sodium: 142 mmol/L (ref 135–145)
Total Bilirubin: 0.4 mg/dL (ref 0.3–1.2)
Total Protein: 5.4 g/dL — ABNORMAL LOW (ref 6.5–8.1)

## 2018-12-19 LAB — URINALYSIS, ROUTINE W REFLEX MICROSCOPIC
Bilirubin Urine: NEGATIVE
Glucose, UA: NEGATIVE mg/dL
Ketones, ur: NEGATIVE mg/dL
Leukocytes, UA: NEGATIVE
Nitrite: NEGATIVE
Protein, ur: 30 mg/dL — AB
Specific Gravity, Urine: 1.005 (ref 1.005–1.030)
pH: 5 (ref 5.0–8.0)

## 2018-12-19 LAB — MAGNESIUM: Magnesium: 2 mg/dL (ref 1.7–2.4)

## 2018-12-19 LAB — LACTIC ACID, PLASMA
LACTIC ACID, VENOUS: 1.2 mmol/L (ref 0.5–1.9)
Lactic Acid, Venous: 0.8 mmol/L (ref 0.5–1.9)

## 2018-12-19 LAB — I-STAT TROPONIN, ED: Troponin i, poc: 0.08 ng/mL (ref 0.00–0.08)

## 2018-12-19 LAB — TROPONIN I
TROPONIN I: 0.07 ng/mL — AB (ref <0.03)
TROPONIN I: 0.07 ng/mL — AB (ref <0.03)

## 2018-12-19 LAB — BRAIN NATRIURETIC PEPTIDE: B Natriuretic Peptide: 1887.1 pg/mL — ABNORMAL HIGH (ref 0.0–100.0)

## 2018-12-19 LAB — GLUCOSE, CAPILLARY
Glucose-Capillary: 110 mg/dL — ABNORMAL HIGH (ref 70–99)
Glucose-Capillary: 198 mg/dL — ABNORMAL HIGH (ref 70–99)

## 2018-12-19 LAB — TSH: TSH: 5.938 u[IU]/mL — ABNORMAL HIGH (ref 0.350–4.500)

## 2018-12-19 LAB — POTASSIUM: Potassium: 5.8 mmol/L — ABNORMAL HIGH (ref 3.5–5.1)

## 2018-12-19 MED ORDER — DONEPEZIL HCL 5 MG PO TABS
5.0000 mg | ORAL_TABLET | Freq: Every day | ORAL | Status: DC
  Administered 2018-12-19 – 2018-12-20 (×2): 5 mg via ORAL
  Filled 2018-12-19 (×2): qty 1

## 2018-12-19 MED ORDER — SODIUM CHLORIDE 0.9 % IV SOLN: 250.0000 mL | INTRAVENOUS | Status: DC | PRN

## 2018-12-19 MED ORDER — ASPIRIN EC 81 MG PO TBEC
81.0000 mg | DELAYED_RELEASE_TABLET | Freq: Every day | ORAL | Status: DC
  Administered 2018-12-19 – 2018-12-22 (×4): 81 mg via ORAL
  Filled 2018-12-19 (×4): qty 1

## 2018-12-19 MED ORDER — INSULIN ASPART 100 UNIT/ML ~~LOC~~ SOLN
0.0000 [IU] | Freq: Every day | SUBCUTANEOUS | Status: DC
  Administered 2018-12-19: 2 [IU] via SUBCUTANEOUS

## 2018-12-19 MED ORDER — SODIUM CHLORIDE 0.9% FLUSH: 3.0000 mL | INTRAVENOUS | Status: DC | PRN

## 2018-12-19 MED ORDER — ALBUTEROL SULFATE (2.5 MG/3ML) 0.083% IN NEBU: 2.5000 mg | INHALATION_SOLUTION | RESPIRATORY_TRACT | Status: DC | PRN

## 2018-12-19 MED ORDER — SODIUM POLYSTYRENE SULFONATE 15 GM/60ML PO SUSP
30.0000 g | Freq: Once | ORAL | Status: AC
  Administered 2018-12-19: 30 g via ORAL
  Filled 2018-12-19: qty 120

## 2018-12-19 MED ORDER — DEXTROSE 50 % IV SOLN
1.0000 | Freq: Once | INTRAVENOUS | Status: AC
  Administered 2018-12-19: 50 mL via INTRAVENOUS
  Filled 2018-12-19: qty 50

## 2018-12-19 MED ORDER — INSULIN ASPART 100 UNIT/ML ~~LOC~~ SOLN
0.0000 [IU] | Freq: Three times a day (TID) | SUBCUTANEOUS | Status: DC
  Administered 2018-12-20 – 2018-12-21 (×2): 1 [IU] via SUBCUTANEOUS
  Administered 2018-12-21: 3 [IU] via SUBCUTANEOUS
  Administered 2018-12-22: 1 [IU] via SUBCUTANEOUS

## 2018-12-19 MED ORDER — METOPROLOL TARTRATE 12.5 MG HALF TABLET
12.5000 mg | ORAL_TABLET | Freq: Two times a day (BID) | ORAL | Status: DC
  Filled 2018-12-19: qty 1

## 2018-12-19 MED ORDER — ATORVASTATIN CALCIUM 80 MG PO TABS
80.0000 mg | ORAL_TABLET | Freq: Every day | ORAL | Status: DC
  Administered 2018-12-19 – 2018-12-21 (×3): 80 mg via ORAL
  Filled 2018-12-19 (×3): qty 1

## 2018-12-19 MED ORDER — ENOXAPARIN SODIUM 30 MG/0.3ML ~~LOC~~ SOLN
30.0000 mg | SUBCUTANEOUS | Status: DC
  Administered 2018-12-19 – 2018-12-22 (×4): 30 mg via SUBCUTANEOUS
  Filled 2018-12-19 (×4): qty 0.3

## 2018-12-19 MED ORDER — LIDOCAINE-EPINEPHRINE 1 %-1:100000 IJ SOLN
10.0000 mL | Freq: Once | INTRAMUSCULAR | Status: DC
  Filled 2018-12-19 (×2): qty 10

## 2018-12-19 MED ORDER — REPAGLINIDE 1 MG PO TABS
0.5000 mg | ORAL_TABLET | Freq: Three times a day (TID) | ORAL | Status: DC
  Administered 2018-12-19 – 2018-12-20 (×4): 0.5 mg via ORAL
  Filled 2018-12-19 (×4): qty 0.5

## 2018-12-19 MED ORDER — ONDANSETRON HCL 4 MG PO TABS: 4.0000 mg | ORAL_TABLET | Freq: Four times a day (QID) | ORAL | Status: DC | PRN

## 2018-12-19 MED ORDER — NITROGLYCERIN 0.4 MG SL SUBL: 0.4000 mg | SUBLINGUAL_TABLET | SUBLINGUAL | Status: DC | PRN

## 2018-12-19 MED ORDER — METOPROLOL TARTRATE 25 MG PO TABS
25.0000 mg | ORAL_TABLET | Freq: Two times a day (BID) | ORAL | Status: DC
  Administered 2018-12-19 – 2018-12-22 (×5): 25 mg via ORAL
  Filled 2018-12-19 (×5): qty 1

## 2018-12-19 MED ORDER — ONDANSETRON HCL 4 MG/2ML IJ SOLN: 4.0000 mg | Freq: Four times a day (QID) | INTRAMUSCULAR | Status: DC | PRN

## 2018-12-19 MED ORDER — FUROSEMIDE 10 MG/ML IJ SOLN
40.0000 mg | Freq: Once | INTRAMUSCULAR | Status: AC
  Administered 2018-12-19: 40 mg via INTRAVENOUS
  Filled 2018-12-19: qty 4

## 2018-12-19 MED ORDER — FUROSEMIDE 10 MG/ML IJ SOLN: 40.0000 mg | Freq: Two times a day (BID) | INTRAMUSCULAR | Status: DC

## 2018-12-19 MED ORDER — FUROSEMIDE 10 MG/ML IJ SOLN
40.0000 mg | Freq: Two times a day (BID) | INTRAMUSCULAR | Status: DC
  Administered 2018-12-19 – 2018-12-22 (×6): 40 mg via INTRAVENOUS
  Filled 2018-12-19 (×6): qty 4

## 2018-12-19 MED ORDER — POLYETHYLENE GLYCOL 3350 17 G PO PACK
17.0000 g | PACK | Freq: Every day | ORAL | Status: DC
  Administered 2018-12-21 – 2018-12-22 (×2): 17 g via ORAL
  Filled 2018-12-19 (×4): qty 1

## 2018-12-19 MED ORDER — INSULIN ASPART 100 UNIT/ML ~~LOC~~ SOLN
5.0000 [IU] | Freq: Once | SUBCUTANEOUS | Status: AC
  Administered 2018-12-19: 5 [IU] via INTRAVENOUS

## 2018-12-19 MED ORDER — SENNOSIDES-DOCUSATE SODIUM 8.6-50 MG PO TABS
1.0000 | ORAL_TABLET | Freq: Every evening | ORAL | Status: DC | PRN
  Administered 2018-12-22: 1 via ORAL
  Filled 2018-12-19: qty 1

## 2018-12-19 MED ORDER — LORAZEPAM 0.5 MG PO TABS: 0.5000 mg | ORAL_TABLET | Freq: Every evening | ORAL | Status: DC | PRN

## 2018-12-19 MED ORDER — SODIUM CHLORIDE 0.9% FLUSH
3.0000 mL | Freq: Two times a day (BID) | INTRAVENOUS | Status: DC
  Administered 2018-12-19 – 2018-12-22 (×6): 3 mL via INTRAVENOUS

## 2018-12-19 MED ORDER — HYDROCODONE-ACETAMINOPHEN 7.5-325 MG PO TABS: 1.0000 | ORAL_TABLET | Freq: Four times a day (QID) | ORAL | Status: DC | PRN

## 2018-12-19 NOTE — Consult Note (Addendum)
Cardiology Consultation:   Patient ID: Chad Mcmillan; [id number]; 09-17-1924   Admit date: 12/19/2018 Date of Consult: 12/19/2018  Primary Care Provider: Binnie Rail, MD Primary Cardiologist: Dorris Carnes, MD 09/01/2018 Daune Perch, NP, 11/27/2018 Primary Electrophysiologist:  None   Patient Profile:   Chad Mcmillan is a 85 y.o. male with a hx of CABG, D-CHF, DM, HTN, HLD, CKD IV, CVA x 2, early dementia, thrombocytopenia and anemia, hyperkalemia, who is being seen today for the evaluation of PVCs and VT at the request of Dr Billy Fischer.  History of Present Illness:   Chad Mcmillan was seen by Ms Phylliss Bob on 11/27/2018, wt 147 lbs. Not volume overloaded. On Lasix 80 mg qd>>40 mg qd, SBP 90s. BMET that day w/ BUN/Cr 75/2.53, K+ 5.6>>hold Lasix and lisinopril. Concern for inadequate fluid intake.   01/27 labs by Dr Quay Burow, K+ 6.4, BUN/Cr 46/2.20>>refer to Nephrologist. Avoid K+ rich foods. Pro-BNP 2511.  Started on chlorthalidone 12.5 mg daily.  01/30 labs K+ 6.0, BUN/Cr 43/2.14, patient finally agreed to see the nephrologist.  Chad Mcmillan has been weighing himself daily.  He notes that his weight is up about 6 pounds in the last few weeks.  He has severe dyspnea on exertion, he uses a walker at baseline, still gets severe shortness of breath going from the bedroom to the bathroom and back.  He has been this bad for a while, cannot say how long.  He has been having problems with getting lightheaded when he gets up out of a chair or gets up out of bed.  This is been going on for a while.  However, last p.m., he had the worst episode ever.  He was trying to go room to room and basically had to get down to keep from falling and crawl to the chair.  He was able to sit in the chair.  That is what made him decide that it was time to come to the hospital.  He has not had any chest pain.  He has not had any palpitations, has not felt his heart skip or race.  He denies lower extremity edema, denies  orthopnea or PND.  He does have nocturia at least once a night.   Past Medical History:  Diagnosis Date  . ADENOCARCINOMA, PROSTATE 06/01/2008  . CORONARY ARTERY DISEASE 06/13/2007  . CVA 06/01/2008   x 2  . DIABETES MELLITUS, TYPE I, WITH OPHTHALMIC COMPLICATIONS 05/27/6268  . 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 HYPERKALEMIA 06/01/2008   due to DM neparopathy  . HYPERLIPIDEMIA 06/13/2007  . HYPERTENSION 06/13/2007  . NEOPLASM, MALIGNANT, SKIN, FACE 06/01/2008  . Orthostatic hypotension 12/19/2018  . Other chronic nonalcoholic liver disease 4/85/4627  . RENAL DISEASE 06/01/2008    Past Surgical History:  Procedure Laterality Date  . Carotid Duplex  09/12/2004  . CATARACT EXTRACTION    . CHOLECYSTECTOMY    . ESOPHAGOGASTRODUODENOSCOPY  06/28/1991  . FEMUR IM NAIL Right 07/17/2018  . HERNIA REPAIR     right Inguinal hernia  . INTRAMEDULLARY (IM) NAIL INTERTROCHANTERIC N/A 07/17/2018   Procedure: INTRAMEDULLARY (IM) NAIL INTERTROCHANTRIC;  Surgeon: Leandrew Koyanagi, MD;  Location: [facility];  Service: Orthopedics;  Laterality: N/A;  . Rest/Stress Cardiolite  01/22/2001     Prior to Admission medications   Medication Sig Start Date End Date Taking? Authorizing Provider  aspirin EC 81 MG tablet Take 1 tablet (81 mg total) by mouth daily. 08/04/17   [name], MD  atorvastatin (LIPITOR) 40 MG  tablet Take 2 tablets (80 mg total) by mouth daily at 6 PM. 06/30/18   [name], MD  calcium-vitamin D (OSCAL 500/200 D-3) 500-200 MG-UNIT tablet Take 1 tab TID x 6 months 11/16/18   Leandrew Koyanagi, MD  chlorthalidone (HYGROTON) 25 MG tablet Take 0.5 tablets (12.5 mg total) by mouth daily. 12/15/18   [name], MD  donepezil (ARICEPT) 5 MG tablet Take 1 tablet (5 mg total) by mouth at bedtime. 06/30/18   [name], MD  FERROUS GLUCONATE PO Take 300 mg by mouth daily.    [provider]  HYDROcodone-acetaminophen (NORCO) 7.5-325 MG tablet Take 1-2 tablets by mouth every 6 (six) hours as needed for  moderate pain. 07/17/18   Leandrew Koyanagi, MD  LORazepam (ATIVAN) 0.5 MG tablet Take 1 tablet (0.5 mg total) by mouth at bedtime as needed for anxiety. 12/02/18   Binnie Rail, MD  meclizine (ANTIVERT) 12.5 MG tablet TAKE 1 TABLET BY MOUTH THREE TIMES DAILY AS NEEDED FOR DIZZINESS 06/30/18   [name], MD  Multiple Vitamins-Minerals (DECUBI-VITE PO) Take 1 capsule by mouth daily.    [provider]  nitroGLYCERIN (NITROSTAT) 0.4 MG SL tablet Place 1 tablet (0.4 mg total) under the tongue every 5 (five) minutes as needed. For chest pain 08/04/17   [name], MD  polyethylene glycol [facility] / Floria Raveling) packet Take 17 g by mouth daily. 07/25/18   Hongalgi, Lenis Dickinson, MD  repaglinide (PRANDIN) 0.5 MG tablet Take 1 tablet (0.5 mg total) by mouth 3 (three) times daily before meals. 06/30/18   [name], MD  senna-docusate (SENOKOT-S) 8.6-50 MG tablet Take 1 tablet by mouth at bedtime as needed for mild constipation or moderate constipation. 07/24/18   Hongalgi, Lenis Dickinson, MD  vitamin B-12 1000 MCG tablet Take 1 tablet (1,000 mcg total) by mouth daily. 03/03/18   Purohit, Konrad Dolores, MD    Inpatient Medications: Scheduled Meds:  Continuous Infusions:  PRN Meds:   Allergies:   No Known Allergies  Social History:   Social History   Socioeconomic History  . Marital status: Married    Spouse name: Not on file  . Number of children: Not on file  . Years of education: Not on file  . Highest education level: Not on file  Occupational History  . Occupation: Retired    Fish farm manager: RETIRED  Social Needs  . Financial resource strain: Not on file  . Food insecurity:    Worry: Not on file    Inability: Not on file  . Transportation needs:    Medical: Not on file    Non-medical: Not on file  Tobacco Use  . Smoking status: Former Smoker    Last attempt to quit: 11/18/1978    Years since quitting: 40.1  . Smokeless tobacco: Never Used  Substance and Sexual Activity  . Alcohol use: No  .  Drug use: No  . Sexual activity: Not Currently  Lifestyle  . Physical activity:    Days per week: Not on file    Minutes per session: Not on file  . Stress: Not on file  Relationships  . Social connections:    Talks on phone: Not on file    Gets together: Not on file    Attends religious service: Not on file    Active member of club or organization: Not on file    Attends meetings of clubs or organizations: Not on file    Relationship status: Not on file  .  Intimate partner violence:    Fear of current or ex partner: Not on file    Emotionally abused: Not on file    Physically abused: Not on file    Forced sexual activity: Not on file  Other Topics Concern  . Not on file  Social History Narrative  . Not on file    Family History:   Family History  Problem Relation Age of Onset  . Vision loss Son    Family Status:  Family Status  Relation Name Status  . Mother  Deceased       "passed when he was 76"  . Father  Deceased       "old age"  . Sister  Deceased       doesn't know hx  . Sister  Deceased       doesn't know hx  . Sister  Alive       doesn't know hx  . Sister  Alive       doesn't know hx  . Brother  Deceased       doesn't know hx  . Brother  Deceased       doesn't know hx  . Brother  Deceased       doesn't know hx  . Brother  Deceased       doesn't know hx  . Brother  Deceased       doesn't know hx  . Son  (Not Specified)    ROS:  Please see the history of present illness.  All other ROS reviewed and negative.     Physical Exam/Data:   Vitals:   12/19/18 1015 12/19/18 1016 12/19/18 1019 12/19/18 1020  BP:  (!) 148/92 (!) 148/92 (!) 148/92  Pulse:  (!) 103 (!) 110 (!) 108  Resp:  (!) 24 (!) 32 (!) 26  Temp:   97.8 F (36.6 C) 97.8 F (36.6 C)  TempSrc:   Oral Oral  SpO2:  100% 100% 100%  Weight: 72.7 kg     Height: 6' (1.829 m)      No intake or output data in the 24 hours ending 12/19/18 1157 Filed Weights   12/19/18 1015  Weight:  72.7 kg   Body mass index is 21.74 kg/m.  General:  Well nourished, well developed, in no acute distress HEENT: normal Lymph: no adenopathy Neck:  JVD 10 cm Endocrine:  No thryomegaly Vascular: No carotid bruits; upper extremity pulses 2+, pedal pulses decreased and capillary refill delayed Cardiac:  normal S1, S2; slightly irregular rate and rhythm; no murmur  Lungs: Decreased breath sounds bases with Rales bilaterally, no wheezing, rhonchi Abd: soft, nontender, no hepatomegaly  Ext: Trace-1+ pedal edema;  Musculoskeletal:  No deformities, BUE and BLE strength normal and equal Skin: warm and dry  Neuro:  CNs 2-12 intact, no focal abnormalities noted Psych:  Normal affect   EKG:  The EKG was personally reviewed and demonstrates: ST frequent PVCs, heart rate 107, some morphology changes from 09/01/2018, unclear significance Telemetry:  Telemetry was personally reviewed and demonstrates: sinus tach, frequent PVCs and runs of nonsustained VT  Relevant CV Studies:  Echo 07/21/2018 Study Conclusions - Left ventricle: The cavity size was normal. There was mild focal basal hypertrophy of the septum. Systolic function was normal. The estimated ejection fraction was in the range of 50% to 55%. Wall motion was normal; there were no regional wall motion abnormalities. Doppler parameters are consistent with abnormal left ventricular relaxation (grade 1 diastolic dysfunction). -  Aortic valve: There was no significant regurgitation. - Mitral valve: There was trivial regurgitation. - Left atrium: The atrium was mildly to moderately dilated. - Right ventricle: Systolic function was normal. - Right atrium: Central venous pressure (est): 3 mm Hg. - Atrial septum: No defect or patent foramen ovale was identified. - Tricuspid valve: There was trivial regurgitation. - Pulmonic valve: There was no significant regurgitation. - Pericardium, extracardiac: There was a left pleural  effusion.  Impressions: - Normal LV systolic function with grade 1 diasolic dysfunction. No significant valvular disease. IVC collapses on respiration, normal CVP.  Laboratory Data:  Chemistry Recent Labs  Lab 12/14/18 1415 12/19/18 1027  NA 139 142  K 6.4 No hemolysis seen* 5.7*  CL 110 117*  CO2 20 18*  GLUCOSE 189* 157*  BUN 46* 41*  CREATININE 2.20* 2.31*  CALCIUM 8.4 8.2*  GFRNONAA  --  24*  GFRAA  --  27*  ANIONGAP  --  7    Lab Results  Component Value Date   ALT 57 (H) 12/19/2018   AST 82 (H) 12/19/2018   ALKPHOS 135 (H) 12/19/2018   BILITOT 0.4 12/19/2018   Hematology Recent Labs  Lab 12/14/18 1415 12/19/18 1027  WBC 5.6 4.5  RBC 2.99* 2.58*  HGB 9.6* 8.1*  HCT 29.5* 26.9*  MCV 98.7 104.3*  MCH  --  31.4  MCHC 32.7 30.1  RDW 13.8 13.4  PLT 105.0* PLATELET CLUMPS NOTED ON SMEAR, COUNT APPEARS DECREASED   Cardiac EnzymesNo results for input(s): TROPONINI in the last 168 hours.  Recent Labs  Lab 12/19/18 1039  TROPIPOC 0.08    BNP Recent Labs  Lab 12/14/18 1415  PROBNP 2,511.0*     TSH:  Lab Results  Component Value Date   TSH 5.500 (H) 09/01/2018   Lipids: Lab Results  Component Value Date   CHOL 119 12/14/2018   HDL 45.80 12/14/2018   LDLCALC 44 12/14/2018   TRIG 148.0 12/14/2018   CHOLHDL 3 12/14/2018   HgbA1c: Lab Results  Component Value Date   HGBA1C 6.5 12/14/2018   Magnesium:  Magnesium  Date Value Ref Range Status  12/19/2018 2.0 1.7 - 2.4 mg/dL Final    Comment:    Performed at [facility], [place] [address], [place], [place] [address]     Radiology/Studies:  Dg Chest Portable 1 View  Result Date: 12/19/2018 CLINICAL DATA:  Chest pain, shortness of breath. EXAM: PORTABLE CHEST 1 VIEW COMPARISON:  Radiographs of July 18, 2018. FINDINGS: Stable cardiomegaly. Atherosclerosis of thoracic aorta is noted. No pneumothorax is noted. Mild left pleural effusion is noted with associated edema or infiltrate.  Right basilar infiltrate or edema is noted with small right pleural effusion. Subacute to chronic distal left clavicular fracture is noted. IMPRESSION: Bibasilar edema or infiltrates are noted with associated pleural effusions, left greater than right. Subacute to chronic distal left clavicular fracture. Aortic Atherosclerosis (ICD10-I70.0). Electronically Signed   By: Marijo Conception, M.D.   On: 12/19/2018 10:41    Assessment and Plan:   1.  VT: -His ECG is read as atrial fibrillation, but Dr. Curt Bears reviewed it carefully and states it is sinus tachycardia with frequent ectopy -Follow on telemetry, check an echo and add beta-blocker as blood pressure Vaishali Baise allow  2.  Hyperkalemia -We Shaelin Lalley give an initial dose of Kayexalate 30 g and otherwise per IM  3. Acute on chronic diastolic CHF - His Lasix was on hold because of his renal insufficiency.  However,  on review of his labs, his creatinine has not changed much since October. - We Shruthi Northrup likely have to tolerate the renal insufficiency in order to keep his volume status is optimal. -Start Lasix 40 mg IV BID and follow renal function.  -Daily BMET, strict intake/output and daily weights  4.  Orthostatic hypotension -Check orthostatic vital signs - We Janiya Millirons have to balance carefully his dyspnea on exertion with his orthostatic hypotension  Otherwise, per IM Active Problems:   Hyperkalemia   DM (diabetes mellitus) (HCC)   CKD (chronic kidney disease), stage IV (HCC)   Orthostatic hypotension   Acute on chronic diastolic (congestive) heart failure ([place])   For questions or updates, please contact [facility] Please consult [email or web address] for contact info under Cardiology/STEMI.   Signed, Rosaria Ferries, PA-C  12/19/2018 11:57 AM  I have seen and examined this patient with Rosaria Ferries.  Agree with above, note added to reflect my findings.  On exam, iRRR, no murmurs, lungs with crackles, JVD halfway up the neck.  He presented to the  hospital with weakness and fatigue was found to have nonsustained VT on his EKG.  He is currently having quite a few PVCs as well.  He is certainly volume overloaded and thus he needs to be diuresed with Lasix.  His potassium is also quite elevated today and thus Rube Sanchez give him some Kayexalate.  I am concerned that his volume overload and rhythm abnormalities could be related to some sort of structural heart issue, though his troponin is not elevated.  He does have an elevated BNP.  We Keelia Graybill check an echocardiogram today..     Daily Crate M. Edvardo Honse MD 12/19/2018 12:41 PM

## 2018-12-19 NOTE — ED Notes (Signed)
Attempted to call report x 1  

## 2018-12-19 NOTE — ED Triage Notes (Signed)
Pt here via [facility] from home, reports worsening generalized weakness and dizziness upon standing for the last few days,  Pt having frequent PVC's with occasional runs of V-tach.  Orthostatic positive with EMS. A&O x4.

## 2018-12-19 NOTE — H&P (Addendum)
[facility]                                                                                    Patient Demographics  Chad Mcmillan, is a 85 y.o. male  CSN: [id number]  MRN: [id number]  DOB - 1924-04-14  Admit Date - 12/19/2018  Outpatient Primary MD for the patient is Binnie Rail, MD   With History of -  Past Medical History:  Diagnosis Date  . ADENOCARCINOMA, PROSTATE 06/01/2008  . CORONARY ARTERY DISEASE 06/13/2007  . CVA 06/01/2008   x 2  . DIABETES MELLITUS, TYPE I, WITH OPHTHALMIC COMPLICATIONS 07/21/8332  . 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 HYPERKALEMIA 06/01/2008   due to DM neparopathy  . HYPERLIPIDEMIA 06/13/2007  . HYPERTENSION 06/13/2007  . NEOPLASM, MALIGNANT, SKIN, FACE 06/01/2008  . Orthostatic hypotension 12/19/2018  . Other chronic nonalcoholic liver disease 8/32/9191  . RENAL DISEASE 06/01/2008      Past Surgical History:  Procedure Laterality Date  . Carotid Duplex  09/12/2004  . CATARACT EXTRACTION    . CHOLECYSTECTOMY    . ESOPHAGOGASTRODUODENOSCOPY  06/28/1991  . FEMUR IM NAIL Right 07/17/2018  . HERNIA REPAIR     right Inguinal hernia  . INTRAMEDULLARY (IM) NAIL INTERTROCHANTERIC N/A 07/17/2018   Procedure: INTRAMEDULLARY (IM) NAIL INTERTROCHANTRIC;  Surgeon: Leandrew Koyanagi, MD;  Location: [place] [facility];  Service: Orthopedics;  Laterality: N/A;  . Rest/Stress Cardiolite  01/22/2001    in for   Chief Complaint  Patient presents with  . Weakness     HPI  Chad Mcmillan  is a 85 y.o. male, with past medical history significant for CAD, CVA, diabetes mellitus type 2 , hypertension, HLD and diastolic congestive heart failure status post echo on 07/2018 with ejection fraction of 50 to 55% presenting with worsening weakness and shortness of breath for the last few weeks.  Today the shortness of breath has increased significantly.  Patient had his Lasix stopped on 22 January by cardiology due to renal insufficiency.  Patient denies any chest pains nausea or vomiting.   Patient was seen in the emergency room and his EKG showed normal sinus rhythm with PVCs.  Cardiology saw and evaluated the patient and advised beta-blockers and Lasix IV for now.  In the emergency room patient also was noted to be hyperkalemic and she received Kayexalate.    Review of Systems    In addition to the HPI above,  No Fever-chills, No Headache, No changes with Vision or hearing, No problems swallowing food or Liquids, No Chest pain, , No Abdominal pain, No Nausea or Vommitting, Bowel movements are regular, No Blood in stool or Urine, No dysuria, No new skin rashes or bruises, No new joints pains-aches,  No recent weight gain or loss, No polyuria, polydypsia or polyphagia, No significant Mental Stressors.  A full 10 point Review of Systems was done, except as stated above, all other Review of Systems were negative.   Social History Social History   Tobacco Use  . Smoking status: Former Smoker    Last attempt to quit: 11/18/1978    Years since quitting: 40.1  . Smokeless tobacco: Never Used  Substance Use Topics  . Alcohol use: No     Family History Family History  Problem Relation Age of Onset  . Vision loss Son      Prior to Admission medications   Medication Sig Start Date End Date Taking? Authorizing Provider  aspirin EC 81 MG tablet Take 1 tablet (81 mg total) by mouth daily. 08/04/17  Yes [name], MD  atorvastatin (LIPITOR) 40 MG tablet Take 2 tablets (80 mg total) by mouth daily at 6 PM. 06/30/18  Yes [name], MD  chlorthalidone (HYGROTON) 25 MG tablet Take 0.5 tablets (12.5 mg total) by mouth daily. 12/15/18  Yes [name], MD  donepezil (ARICEPT) 5 MG tablet Take 1 tablet (5 mg total) by mouth at bedtime. 06/30/18  Yes [name], MD  Ferrous Gluconate 324 (37.5 Fe) MG TABS Take 324 mg by mouth daily.    Yes [provider]  LORazepam (ATIVAN) 0.5 MG tablet Take 1 tablet (0.5 mg total) by mouth at bedtime as needed for anxiety.  12/02/18  Yes Burns, Claudina Lick, MD  meclizine (ANTIVERT) 12.5 MG tablet TAKE 1 TABLET BY MOUTH THREE TIMES DAILY AS NEEDED FOR DIZZINESS 06/30/18  Yes [name], MD  nitroGLYCERIN (NITROSTAT) 0.4 MG SL tablet Place 1 tablet (0.4 mg total) under the tongue every 5 (five) minutes as needed. For chest pain 08/04/17  Yes [name], MD  repaglinide (PRANDIN) 0.5 MG tablet Take 1 tablet (0.5 mg total) by mouth 3 (three) times daily before meals. 06/30/18  Yes [name], MD  vitamin B-12 1000 MCG tablet Take 1 tablet (1,000 mcg total) by mouth daily. 03/03/18  Yes Purohit, Konrad Dolores, MD  calcium-vitamin D (OSCAL 500/200 D-3) 500-200 MG-UNIT tablet Take 1 tab TID x 6 months Patient not taking: Reported on 12/19/2018 11/16/18   Leandrew Koyanagi, MD  HYDROcodone-acetaminophen (NORCO) 7.5-325 MG tablet Take 1-2 tablets by mouth every 6 (six) hours as needed for moderate pain. Patient not taking: Reported on 12/19/2018 07/17/18   Leandrew Koyanagi, MD  polyethylene glycol [facility] / Floria Raveling) packet Take 17 g by mouth daily. Patient not taking: Reported on 12/19/2018 07/25/18   Modena Jansky, MD  senna-docusate (SENOKOT-S) 8.6-50 MG tablet Take 1 tablet by mouth at bedtime as needed for mild constipation or moderate constipation. Patient not taking: Reported on 12/19/2018 07/24/18   Modena Jansky, MD    No Known Allergies  Physical Exam  Vitals  Blood pressure (!) 148/92, pulse (!) 108, temperature 97.8 F (36.6 C), temperature source Oral, resp. rate (!) 26, height 6' (1.829 m), weight 72.7 kg, SpO2 100 %.   1. General elderly male, extremely pleasant, looks tired  2. Normal affect and insight, Not Suicidal or Homicidal, Awake Alert, Oriented X 3.  3. No F.N deficits, grossly, patient moving all extremities.  4. Ears and Eyes appear Normal, Conjunctivae clear, PERRLA. Moist Oral Mucosa.  5. Supple Neck, No JVD, No cervical lymphadenopathy appriciated, No Carotid Bruits.  6. Symmetrical Chest wall  movement, decreased breath sounds at the bases.  7.  Irregular, No Gallops, Rubs or Murmurs, No Parasternal Heave.  8. Positive Bowel Sounds, Abdomen Soft, Non tender, No organomegaly appriciated,No rebound -guarding or rigidity.  9.  No Cyanosis, Normal Skin Turgor, No Skin Rash or Bruise.  10. Good muscle tone,  joints appear normal , no effusions, Normal ROM.    Data Review  CBC Recent Labs  Lab 12/14/18 1415 12/19/18 1027  WBC 5.6  4.5  HGB 9.6* 8.1*  HCT 29.5* 26.9*  PLT 105.0* PLATELET CLUMPS NOTED ON SMEAR, COUNT APPEARS DECREASED  MCV 98.7 104.3*  MCH  --  31.4  MCHC 32.7 30.1  RDW 13.8 13.4  LYMPHSABS 1.3 1.0  MONOABS 0.3 0.4  EOSABS 0.0 0.0  BASOSABS 0.0 0.0   ------------------------------------------------------------------------------------------------------------------  Chemistries  Recent Labs  Lab 12/14/18 1415 12/19/18 1027  NA 139 142  K 6.4 No hemolysis seen* 5.7*  CL 110 117*  CO2 20 18*  GLUCOSE 189* 157*  BUN 46* 41*  CREATININE 2.20* 2.31*  CALCIUM 8.4 8.2*  MG  --  2.0  AST 71* 82*  ALT 49 57*  ALKPHOS 157* 135*  BILITOT 0.7 0.4   ------------------------------------------------------------------------------------------------------------------ estimated creatinine clearance is 21 mL/min (A) (by C-G formula based on SCr of 2.31 mg/dL (H)). ------------------------------------------------------------------------------------------------------------------ No results for input(s): TSH, T4TOTAL, T3FREE, THYROIDAB in the last 72 hours.  Invalid input(s): FREET3   Coagulation profile No results for input(s): INR, PROTIME in the last 168 hours. ------------------------------------------------------------------------------------------------------------------- No results for input(s): DDIMER in the last 72 hours. -------------------------------------------------------------------------------------------------------------------  Cardiac  Enzymes No results for input(s): CKMB, TROPONINI, MYOGLOBIN in the last 168 hours.  Invalid input(s): CK ------------------------------------------------------------------------------------------------------------------ Invalid input(s): POCBNP   ---------------------------------------------------------------------------------------------------------------  Urinalysis    Component Value Date/Time   COLORURINE YELLOW 07/17/2018 0232   APPEARANCEUR CLEAR 07/17/2018 0232   LABSPEC 1.012 07/17/2018 0232   PHURINE 5.0 07/17/2018 0232   GLUCOSEU 50 (A) 07/17/2018 0232   GLUCOSEU NEGATIVE 09/07/2014 1334   HGBUR SMALL (A) 07/17/2018 0232   BILIRUBINUR NEGATIVE 07/17/2018 0232   KETONESUR NEGATIVE 07/17/2018 0232   PROTEINUR >=300 (A) 07/17/2018 0232   UROBILINOGEN 0.2 09/07/2014 1334   NITRITE NEGATIVE 07/17/2018 0232   LEUKOCYTESUR NEGATIVE 07/17/2018 0232    ----------------------------------------------------------------------------------------------------------------   Imaging results:   Dg Chest Portable 1 View  Result Date: 12/19/2018 CLINICAL DATA:  Chest pain, shortness of breath. EXAM: PORTABLE CHEST 1 VIEW COMPARISON:  Radiographs of July 18, 2018. FINDINGS: Stable cardiomegaly. Atherosclerosis of thoracic aorta is noted. No pneumothorax is noted. Mild left pleural effusion is noted with associated edema or infiltrate. Right basilar infiltrate or edema is noted with small right pleural effusion. Subacute to chronic distal left clavicular fracture is noted. IMPRESSION: Bibasilar edema or infiltrates are noted with associated pleural effusions, left greater than right. Subacute to chronic distal left clavicular fracture. Aortic Atherosclerosis (ICD10-I70.0). Electronically Signed   By: Marijo Conception, M.D.   On: 12/19/2018 10:41    My personal review of EKG: A. fib at a rate of 107 with PVCs and left bundle branch block  Assessment & Plan  Decompensated congestive heart  failure, diastolic.  Echo on 07/2018 showing ejection fraction of 50 to 55% Lasix 40 mg IV twice daily and hold chlorthalidone at this time Fluid restriction Cardiology following  Diabetes mellitus type 2 on Prandin Continue with ISS  Hyperkalemia Status post Kayexalate  Chronic A. fib with PVCs and irregular rhythm followed by cardiology Lopressor and monitor  Chronic renal failure stage 4 Monitor with diuresis  History of CVA, other nonhemorrhagic Stable, continue with aspirin and statin  Hyperlipidemia Continue with statin  Hyper troponinemia , probably demand ischemia versus decreased clearance from chronic renal failure Cardiology is following, patient is chest pain-free    DVT Prophylaxis Lovenox  AM Labs Ordered, also please review Full Orders  Family Communication: Admission, patients condition and plan of care including tests being ordered have been discussed with  the patient and son who indicate understanding and agree with the plan and Code Status.  Code Status full  Disposition Plan: Home  Time spent in minutes : 45 minutes  Condition GUARDED   @SIGNATURE @

## 2018-12-19 NOTE — ED Provider Notes (Signed)
[facility] EMERGENCY DEPARTMENT Provider Note   CSN: [id number] Arrival date & time: 12/19/18  1011     History   Chief Complaint Chief Complaint  Patient presents with  . Weakness    HPI Chad Mcmillan is a 85 y.o. male.  HPI  85 year old male with a history of coronary artery disease, diabetes, CVA, hypertension, hyperlipidemia, chronic diastolic heart failure who presents with concern for generalized weakness, dyspnea and fatigue.  Reports that it has been present over weeks to months, but much worse over the last week.  Reports severe fatigue and dyspnea on exertion.  Reports that he tried to walk to the bathroom with his walker approximately 20 feet today, and felt very lightheaded, very short of breath, and felt like he was going to pass out.  Reports dyspnea with minimal exertion.  Cardiology had decreased lasix, then held lasix after 1/10.  He had hyperkalemia as an outpatient, but did not want to come to hospital. Reports weight up approx 6lb.  Past Medical History:  Diagnosis Date  . ADENOCARCINOMA, PROSTATE 06/01/2008  . CORONARY ARTERY DISEASE 06/13/2007  . CVA 06/01/2008   x 2  . DIABETES MELLITUS, TYPE I, WITH OPHTHALMIC COMPLICATIONS 04/15/4131  . 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 HYPERKALEMIA 06/01/2008   due to DM neparopathy  . HYPERLIPIDEMIA 06/13/2007  . HYPERTENSION 06/13/2007  . NEOPLASM, MALIGNANT, SKIN, FACE 06/01/2008  . Orthostatic hypotension 12/19/2018  . Other chronic nonalcoholic liver disease 4/40/1027  . RENAL DISEASE 06/01/2008    Patient Active Problem List   Diagnosis Date Noted  . Orthostatic hypotension 12/19/2018  . Acute on chronic diastolic (congestive) heart failure ([place]) 12/19/2018  . Congestive heart failure (CHF) ([place]) 12/19/2018  . Pressure injury of skin 12/19/2018  . Vitamin B12 deficiency 12/13/2018  . Vitamin D deficiency 12/13/2018  . Paroxysmal atrial fibrillation with RVR ([place])   . Chronic diastolic heart failure ([place])     . S/P CABG (coronary artery bypass graft)   . Closed fracture of acromial end of left clavicle 07/19/2018    Class: Acute  . Closed displaced fracture of greater trochanter of right femur ([place]) 07/17/2018  . History of thrombocytopenia 07/17/2018  . Hypocalcemia 02/12/2018  . Agitation   . Dementia ([place])   . CKD (chronic kidney disease), stage IV ([place]) 01/14/2018  . Inguinal hernia of right side without obstruction or gangrene 01/12/2018  . Vertigo 01/07/2018  . LBBB (left bundle branch block) 01/07/2018  . Generalized weakness 01/07/2018  . Foot pain, bilateral 10/07/2017  . Dizzy 01/31/2016  . Pancytopenia ([place]) 07/27/2012  . DM (diabetes mellitus) ([place]) 07/27/2012  . Anemia 07/27/2012  . NASH (nonalcoholic steatohepatitis) 07/17/2010  . ADENOCARCINOMA, PROSTATE 06/01/2008  . Hyperkalemia 06/01/2008  . History of stroke 06/01/2008  . NEOPLASM, MALIGNANT, SKIN, FACE 06/01/2008  . Dyslipidemia 06/13/2007  . Essential hypertension 06/13/2007  . CAD (coronary artery disease) 06/13/2007    Past Surgical History:  Procedure Laterality Date  . Carotid Duplex  09/12/2004  . CATARACT EXTRACTION    . CHOLECYSTECTOMY    . ESOPHAGOGASTRODUODENOSCOPY  06/28/1991  . FEMUR IM NAIL Right 07/17/2018  . HERNIA REPAIR     right Inguinal hernia  . INTRAMEDULLARY (IM) NAIL INTERTROCHANTERIC N/A 07/17/2018   Procedure: INTRAMEDULLARY (IM) NAIL INTERTROCHANTRIC;  Surgeon: Leandrew Koyanagi, MD;  Location: [facility];  Service: Orthopedics;  Laterality: N/A;  . Rest/Stress Cardiolite  01/22/2001        Home Medications    Prior  to Admission medications   Medication Sig Start Date End Date Taking? Authorizing Provider  aspirin EC 81 MG tablet Take 1 tablet (81 mg total) by mouth daily. 08/04/17  Yes [name], MD  atorvastatin (LIPITOR) 40 MG tablet Take 2 tablets (80 mg total) by mouth daily at 6 PM. 06/30/18  Yes [name], MD  chlorthalidone (HYGROTON) 25 MG tablet Take 0.5 tablets (12.5  mg total) by mouth daily. 12/15/18  Yes [name], MD  donepezil (ARICEPT) 5 MG tablet Take 1 tablet (5 mg total) by mouth at bedtime. 06/30/18  Yes [name], MD  Ferrous Gluconate 324 (37.5 Fe) MG TABS Take 324 mg by mouth daily.    Yes [provider]  LORazepam (ATIVAN) 0.5 MG tablet Take 1 tablet (0.5 mg total) by mouth at bedtime as needed for anxiety. 12/02/18  Yes Burns, Claudina Lick, MD  meclizine (ANTIVERT) 12.5 MG tablet TAKE 1 TABLET BY MOUTH THREE TIMES DAILY AS NEEDED FOR DIZZINESS 06/30/18  Yes [name], MD  nitroGLYCERIN (NITROSTAT) 0.4 MG SL tablet Place 1 tablet (0.4 mg total) under the tongue every 5 (five) minutes as needed. For chest pain 08/04/17  Yes [name], MD  repaglinide (PRANDIN) 0.5 MG tablet Take 1 tablet (0.5 mg total) by mouth 3 (three) times daily before meals. 06/30/18  Yes [name], MD  vitamin B-12 1000 MCG tablet Take 1 tablet (1,000 mcg total) by mouth daily. 03/03/18  Yes Purohit, Konrad Dolores, MD  calcium-vitamin D (OSCAL 500/200 D-3) 500-200 MG-UNIT tablet Take 1 tab TID x 6 months Patient not taking: Reported on 12/19/2018 11/16/18   Leandrew Koyanagi, MD  HYDROcodone-acetaminophen (NORCO) 7.5-325 MG tablet Take 1-2 tablets by mouth every 6 (six) hours as needed for moderate pain. Patient not taking: Reported on 12/19/2018 07/17/18   Leandrew Koyanagi, MD  polyethylene glycol [facility] / Floria Raveling) packet Take 17 g by mouth daily. Patient not taking: Reported on 12/19/2018 07/25/18   Modena Jansky, MD  senna-docusate (SENOKOT-S) 8.6-50 MG tablet Take 1 tablet by mouth at bedtime as needed for mild constipation or moderate constipation. Patient not taking: Reported on 12/19/2018 07/24/18   Modena Jansky, MD    Family History Family History  Problem Relation Age of Onset  . Vision loss Son     Social History Social History   Tobacco Use  . Smoking status: Former Smoker    Last attempt to quit: 11/18/1978    Years since quitting: 40.1  . Smokeless  tobacco: Never Used  Substance Use Topics  . Alcohol use: No  . Drug use: No     Allergies   Patient has no known allergies.   Review of Systems Review of Systems  Constitutional: Positive for fatigue. Negative for fever.  HENT: Negative for sore throat.   Eyes: Negative for visual disturbance.  Respiratory: Positive for cough and shortness of breath.   Cardiovascular: Negative for chest pain and leg swelling.  Gastrointestinal: Negative for abdominal pain, diarrhea, nausea and vomiting.  Genitourinary: Negative for difficulty urinating.  Musculoskeletal: Positive for gait problem (can't walk because of generalized weakness). Negative for back pain and neck stiffness.  Skin: Negative for rash.  Neurological: Positive for light-headedness. Negative for syncope and headaches.     Physical Exam Updated Vital Signs BP (!) 163/57 (BP Location: Right Arm)   Pulse (!) 57   Temp 98.3 F (36.8 C) (Oral)   Resp 18   Ht 6' (1.829 m)  Wt 71.6 kg   SpO2 100%   BMI 21.40 kg/m   Physical Exam Vitals signs and nursing note reviewed.  Constitutional:      General: He is not in acute distress.    Appearance: He is well-developed. He is not diaphoretic.  HENT:     Head: Normocephalic and atraumatic.  Eyes:     Conjunctiva/sclera: Conjunctivae normal.  Neck:     Musculoskeletal: Normal range of motion.     Vascular: JVD present.  Cardiovascular:     Rate and Rhythm: Normal rate and regular rhythm.     Heart sounds: Normal heart sounds. No murmur. No friction rub. No gallop.   Pulmonary:     Effort: Pulmonary effort is normal. No respiratory distress.     Breath sounds: Normal breath sounds. No wheezing or rales.  Abdominal:     General: There is no distension.     Palpations: Abdomen is soft.     Tenderness: There is no abdominal tenderness. There is no guarding.  Musculoskeletal:     Right lower leg: Edema present.     Left lower leg: Edema present.  Skin:    General:  Skin is warm and dry.  Neurological:     Mental Status: He is alert and oriented to person, place, and time.      ED Treatments / Results  Labs (all labs ordered are listed, but only abnormal results are displayed) Labs Reviewed  CBC WITH DIFFERENTIAL/PLATELET - Abnormal; Notable for the following components:      Result Value   RBC 2.58 (*)    Hemoglobin 8.1 (*)    HCT 26.9 (*)    MCV 104.3 (*)    All other components within normal limits  COMPREHENSIVE METABOLIC PANEL - Abnormal; Notable for the following components:   Potassium 5.7 (*)    Chloride 117 (*)    CO2 18 (*)    Glucose, Bld 157 (*)    BUN 41 (*)    Creatinine, Ser 2.31 (*)    Calcium 8.2 (*)    Total Protein 5.4 (*)    Albumin 2.6 (*)    AST 82 (*)    ALT 57 (*)    Alkaline Phosphatase 135 (*)    GFR calc non Af Amer 24 (*)    GFR calc Af Amer 27 (*)    All other components within normal limits  BRAIN NATRIURETIC PEPTIDE - Abnormal; Notable for the following components:   B Natriuretic Peptide 1,887.1 (*)    All other components within normal limits  TSH - Abnormal; Notable for the following components:   TSH 5.938 (*)    All other components within normal limits  TROPONIN I - Abnormal; Notable for the following components:   Troponin I 0.07 (*)    All other components within normal limits  POTASSIUM - Abnormal; Notable for the following components:   Potassium 5.8 (*)    All other components within normal limits  GLUCOSE, CAPILLARY - Abnormal; Notable for the following components:   Glucose-Capillary 110 (*)    All other components within normal limits  MAGNESIUM  LACTIC ACID, PLASMA  LACTIC ACID, PLASMA  URINALYSIS, ROUTINE W REFLEX MICROSCOPIC  TROPONIN I  TROPONIN I  BASIC METABOLIC PANEL  I-STAT TROPONIN, ED  I-STAT TROPONIN, ED  I-STAT TROPONIN, ED    EKG None  Radiology Dg Chest Portable 1 View  Result Date: 12/19/2018 CLINICAL DATA:  Chest pain, shortness of breath. EXAM:  PORTABLE  CHEST 1 VIEW COMPARISON:  Radiographs of July 18, 2018. FINDINGS: Stable cardiomegaly. Atherosclerosis of thoracic aorta is noted. No pneumothorax is noted. Mild left pleural effusion is noted with associated edema or infiltrate. Right basilar infiltrate or edema is noted with small right pleural effusion. Subacute to chronic distal left clavicular fracture is noted. IMPRESSION: Bibasilar edema or infiltrates are noted with associated pleural effusions, left greater than right. Subacute to chronic distal left clavicular fracture. Aortic Atherosclerosis (ICD10-I70.0). Electronically Signed   By: Marijo Conception, M.D.   On: 12/19/2018 10:41    Procedures .Critical Care Performed by: Gareth Morgan, MD Authorized by: Gareth Morgan, MD   Critical care provider statement:    Critical care time (minutes):  30   Critical care was time spent personally by me on the following activities:  Discussions with consultants, evaluation of patient's response to treatment, examination of patient, ordering and performing treatments and interventions, ordering and review of laboratory studies, ordering and review of radiographic studies, pulse oximetry, re-evaluation of patient's condition, obtaining history from patient or surrogate and review of old charts   (including critical care time)  Medications Ordered in ED Medications  lidocaine-EPINEPHrine (XYLOCAINE W/EPI) 1 %-1:100000 (with pres) injection 10 mL (10 mLs Intradermal Not Given 12/19/18 1528)  furosemide (LASIX) injection 40 mg (40 mg Intravenous Given 12/19/18 1655)  HYDROcodone-acetaminophen (NORCO) 7.5-325 MG per tablet 1-2 tablet (has no administration in time range)  polyethylene glycol (MIRALAX / GLYCOLAX) packet 17 g (17 g Oral Not Given 12/19/18 1656)  senna-docusate (Senokot-S) tablet 1 tablet (has no administration in time range)  aspirin EC tablet 81 mg (81 mg Oral Given 12/19/18 1654)  atorvastatin (LIPITOR) tablet 80 mg (80 mg Oral  Given 12/19/18 1654)  nitroGLYCERIN (NITROSTAT) SL tablet 0.4 mg (has no administration in time range)  donepezil (ARICEPT) tablet 5 mg (has no administration in time range)  LORazepam (ATIVAN) tablet 0.5 mg (has no administration in time range)  repaglinide (PRANDIN) tablet 0.5 mg (0.5 mg Oral Given 12/19/18 1653)  enoxaparin (LOVENOX) injection 30 mg (30 mg Subcutaneous Given 12/19/18 1654)  sodium chloride flush (NS) 0.9 % injection 3 mL (3 mLs Intravenous Not Given 12/19/18 1627)  sodium chloride flush (NS) 0.9 % injection 3 mL (has no administration in time range)  0.9 %  sodium chloride infusion (has no administration in time range)  ondansetron (ZOFRAN) tablet 4 mg (has no administration in time range)    Or  ondansetron (ZOFRAN) injection 4 mg (has no administration in time range)  albuterol (PROVENTIL) (2.5 MG/3ML) 0.083% nebulizer solution 2.5 mg (has no administration in time range)  insulin aspart (novoLOG) injection 0-9 Units (0 Units Subcutaneous Not Given 12/19/18 1637)  insulin aspart (novoLOG) injection 0-5 Units (has no administration in time range)  metoprolol tartrate (LOPRESSOR) tablet 25 mg (25 mg Oral Not Given 12/19/18 1626)  furosemide (LASIX) injection 40 mg (40 mg Intravenous Given 12/19/18 1248)  insulin aspart (novoLOG) injection 5 Units (5 Units Intravenous Given 12/19/18 1250)  dextrose 50 % solution 50 mL (50 mLs Intravenous Given 12/19/18 1248)  sodium polystyrene (KAYEXALATE) 15 GM/60ML suspension 30 g (30 g Oral Given 12/19/18 1258)     Initial Impression / Assessment and Plan / ED Course  I have reviewed the triage vital signs and the nursing notes.  Pertinent labs & imaging results that were available during my care of the patient were reviewed by me and considered in my medical decision making (see chart for details).  85 year old male with a history of coronary artery disease, diabetes, CVA, hypertension, hyperlipidemia, chronic diastolic heart failure who presents  with concern for generalized weakness, dyspnea and fatigue.  Patient with nonsustained VT with EMS.  Exam and CXR consistent with CHF exacerbation.  Potassium 5.7, given insulin and dextrose. Troponin negative. Given lasix for volume overload. Cardiology consulted and hospitalist to admit.   Final Clinical Impressions(s) / ED Diagnoses   Final diagnoses:  Acute on chronic systolic congestive heart failure (HCC)  Hyperkalemia  Nonsustained ventricular tachycardia [facility])    ED Discharge Orders    None       Gareth Morgan, MD 12/19/18 2018

## 2018-12-19 NOTE — Progress Notes (Signed)
CRITICAL VALUE ALERT  Critical Value:  Troponin 0.07  Date & Time Notied: 12/19/2018 at 1620.  Provider Notified: Text paged Dr. Laren Everts  Orders Received/Actions taken: No new orders received at this time.

## 2018-12-20 ENCOUNTER — Other Ambulatory Visit (HOSPITAL_COMMUNITY): Payer: Medicare Other

## 2018-12-20 ENCOUNTER — Inpatient Hospital Stay (HOSPITAL_COMMUNITY): Payer: Medicare Other

## 2018-12-20 DIAGNOSIS — I340 Nonrheumatic mitral (valve) insufficiency: Secondary | ICD-10-CM

## 2018-12-20 LAB — GLUCOSE, CAPILLARY
GLUCOSE-CAPILLARY: 133 mg/dL — AB (ref 70–99)
Glucose-Capillary: 62 mg/dL — ABNORMAL LOW (ref 70–99)
Glucose-Capillary: 136 mg/dL — ABNORMAL HIGH (ref 70–99)
Glucose-Capillary: 96 mg/dL (ref 70–99)

## 2018-12-20 LAB — ECHOCARDIOGRAM COMPLETE
Height: 72 in
Weight: 2427.2 oz

## 2018-12-20 LAB — BASIC METABOLIC PANEL
ANION GAP: 8 (ref 5–15)
BUN: 44 mg/dL — ABNORMAL HIGH (ref 8–23)
CO2: 21 mmol/L — ABNORMAL LOW (ref 22–32)
Calcium: 8.5 mg/dL — ABNORMAL LOW (ref 8.9–10.3)
Chloride: 113 mmol/L — ABNORMAL HIGH (ref 98–111)
Creatinine, Ser: 2.5 mg/dL — ABNORMAL HIGH (ref 0.61–1.24)
GFR calc non Af Amer: 21 mL/min — ABNORMAL LOW (ref >60)
GFR, EST AFRICAN AMERICAN: 25 mL/min — AB (ref >60)
Glucose, Bld: 91 mg/dL (ref 70–99)
Potassium: 5.3 mmol/L — ABNORMAL HIGH (ref 3.5–5.1)
Sodium: 142 mmol/L (ref 135–145)

## 2018-12-20 LAB — TROPONIN I: Troponin I: 0.12 ng/mL (ref <0.03)

## 2018-12-20 MED ORDER — SODIUM POLYSTYRENE SULFONATE 15 GM/60ML PO SUSP
15.0000 g | Freq: Once | ORAL | Status: AC
  Administered 2018-12-20: 15 g via ORAL
  Filled 2018-12-20: qty 60

## 2018-12-20 NOTE — Progress Notes (Signed)
Patient is still having PVC's, not as frequent at this time and is sleeping with no SD/S of distress, still diuresing well from his previous Lasix dose, will continue to monitor, no new orders received at this time.

## 2018-12-20 NOTE — Progress Notes (Signed)
Text paged [facility] with elevated Troponin of 0.12 up from 0.07, VSS with some PVC's, alos let them know potassium came down to 5.3 and that he finally had a BM from the kayexalate, asymptomatic, will continue to monitor and check for further orders.

## 2018-12-20 NOTE — Progress Notes (Signed)
Progress Note  Patient Name: Chad Mcmillan Date of Encounter: 12/20/2018  Primary Cardiologist: Dorris Carnes, MD   Subjective   Currently feeling well.  No chest pain or shortness of breath.  Certainly has more energy since he did yesterday.  Inpatient Medications    Scheduled Meds: . aspirin EC  81 mg Oral Daily  . atorvastatin  80 mg Oral q1800  . donepezil  5 mg Oral QHS  . enoxaparin (LOVENOX) injection  30 mg Subcutaneous Q24H  . furosemide  40 mg Intravenous BID  . insulin aspart  0-5 Units Subcutaneous QHS  . insulin aspart  0-9 Units Subcutaneous TID WC  . lidocaine-EPINEPHrine  10 mL Intradermal Once  . metoprolol tartrate  25 mg Oral BID  . polyethylene glycol  17 g Oral Daily  . repaglinide  0.5 mg Oral TID AC  . sodium chloride flush  3 mL Intravenous Q12H   Continuous Infusions: . sodium chloride     PRN Meds: sodium chloride, albuterol, HYDROcodone-acetaminophen, LORazepam, nitroGLYCERIN, ondansetron **OR** ondansetron (ZOFRAN) IV, senna-docusate, sodium chloride flush   Vital Signs    Vitals:   12/20/18 0348 12/20/18 0353 12/20/18 0355 12/20/18 0812  BP:  (!) 150/81  (!) 142/73  Pulse:    62  Resp:   18   Temp:   98.1 F (36.7 C)   TempSrc:   Oral   SpO2:   100%   Weight: 68.8 kg     Height:        Intake/Output Summary (Last 24 hours) at 12/20/2018 0959 Last data filed at 12/20/2018 0800 Gross per 24 hour  Intake 1080 ml  Output 2000 ml  Net -920 ml   Last 3 Weights 12/20/2018 12/19/2018 12/19/2018  Weight (lbs) 151 lb 11.2 oz 157 lb 12.8 oz 160 lb 4.4 oz  Weight (kg) 68.811 kg 71.578 kg 72.7 kg      Telemetry    Sinus rhythm, first-degree AV block, PVCs- Personally Reviewed  ECG    None new- Personally Reviewed  Physical Exam   GEN: No acute distress.   Neck: No JVD Cardiac: RRR, no murmurs, rubs, or gallops.  Respiratory: Clear to auscultation bilaterally. GI: Soft, nontender, non-distended  MS: No edema; No deformity. Neuro:  Nonfocal   Psych: Normal affect   Labs    Chemistry Recent Labs  Lab 12/14/18 1415 12/19/18 1027 12/19/18 1756 12/20/18 0346  NA 139 142  --  142  K 6.4 No hemolysis seen* 5.7* 5.8* 5.3*  CL 110 117*  --  113*  CO2 20 18*  --  21*  GLUCOSE 189* 157*  --  91  BUN 46* 41*  --  44*  CREATININE 2.20* 2.31*  --  2.50*  CALCIUM 8.4 8.2*  --  8.5*  PROT 5.9* 5.4*  --   --   ALBUMIN 3.2* 2.6*  --   --   AST 71* 82*  --   --   ALT 49 57*  --   --   ALKPHOS 157* 135*  --   --   BILITOT 0.7 0.4  --   --   GFRNONAA  --  24*  --  21*  GFRAA  --  27*  --  25*  ANIONGAP  --  7  --  8     Hematology Recent Labs  Lab 12/14/18 1415 12/19/18 1027  WBC 5.6 4.5  RBC 2.99* 2.58*  HGB 9.6* 8.1*  HCT 29.5* 26.9*  MCV 98.7  104.3*  MCH  --  31.4  MCHC 32.7 30.1  RDW 13.8 13.4  PLT 105.0* PLATELET CLUMPS NOTED ON SMEAR, COUNT APPEARS DECREASED    Cardiac Enzymes Recent Labs  Lab 12/19/18 1441 12/19/18 2005 12/20/18 0346  TROPONINI 0.07* 0.07* 0.12*    Recent Labs  Lab 12/19/18 1039  TROPIPOC 0.08     BNP Recent Labs  Lab 12/14/18 1415 12/19/18 1028  BNP  --  1,887.1*  PROBNP 2,511.0*  --      DDimer No results for input(s): DDIMER in the last 168 hours.   Radiology    Dg Chest Portable 1 View  Result Date: 12/19/2018 CLINICAL DATA:  Chest pain, shortness of breath. EXAM: PORTABLE CHEST 1 VIEW COMPARISON:  Radiographs of July 18, 2018. FINDINGS: Stable cardiomegaly. Atherosclerosis of thoracic aorta is noted. No pneumothorax is noted. Mild left pleural effusion is noted with associated edema or infiltrate. Right basilar infiltrate or edema is noted with small right pleural effusion. Subacute to chronic distal left clavicular fracture is noted. IMPRESSION: Bibasilar edema or infiltrates are noted with associated pleural effusions, left greater than right. Subacute to chronic distal left clavicular fracture. Aortic Atherosclerosis (ICD10-I70.0). Electronically Signed   By: Marijo Conception, M.D.   On: 12/19/2018 10:41    Cardiac Studies   TTE pending  Patient Profile     85 y.o. male who presented to the hospital with weakness and fatigue.  He was having a high burden of PVCs.  He was found to be volume overloaded as well as hyperkalemic.  Assessment & Plan    1.  PVCs: PVCs have improved with diuresis.  This could have been resulting from volume overload and ventricular stretch.  Would hold off on further therapy.  Continue metoprolol.  2.  Heart failure: Has diastolic dysfunction, but potentially systolic dysfunction after this admission.  He did get diuresis and is net out almost 1 L.  His creatinine has remained relatively stable.  His potassium does remain high.  We Jeaneen Cala continue Lasix.  3.  Hyperkalemia: Has come down nicely with Kayexalate.  We Jodeen Mclin continue Lasix.  He continues to have bowel movements.      For questions or updates, please contact [place] Please consult [email or web address] for contact info under        Signed, Jaylee Freeze Meredith Leeds, MD  12/20/2018, 9:59 AM

## 2018-12-20 NOTE — Progress Notes (Signed)
  Echocardiogram 2D Echocardiogram has been performed.  Chad Mcmillan 12/20/2018, 4:51 PM

## 2018-12-20 NOTE — Progress Notes (Signed)
Text paged [facility] since repeat potassium was 5.8 up from previous of 5.7. Patient received Insulin, D50 an Kayexalate in the ED with no results but patient did receive an additional 40 mg IV Lasix at 1800 and is diuresing quite well from that, will continue to monitor and look for further orders or will call back if he continues to have multiple PVC's, at this time he's stable, will continue to monitor.

## 2018-12-20 NOTE — Progress Notes (Signed)
PROGRESS NOTE                                                                                                                                                                                                             Patient Demographics:    Chad Mcmillan, is a 85 y.o. male, DOB - 01/02/1924, IRJ:[id number]  Admit date - 12/19/2018   Admitting Physician Merton Border, MD  Outpatient Primary MD for the patient is Burns, Claudina Lick, MD  LOS - 1   Chief Complaint  Patient presents with  . Weakness       Brief Narrative    Chad Mcmillan  is a 85 y.o. male, with past medical history significant for CAD, CVA, diabetes mellitus type 2 , hypertension, HLD and diastolic congestive heart failure status post echo on 07/2018 with ejection fraction of 50 to 55% presenting with worsening weakness and shortness of breath for the last few weeks.  Today the shortness of breath has increased significantly.  Patient had his Lasix stopped on 22 January by cardiology due to renal insufficiency.  Patient denies any chest pains nausea or vomiting.  Patient was seen in the emergency room and his EKG showed normal sinus rhythm with PVCs.  Cardiology saw and evaluated the patient and advised beta-blockers and Lasix IV for now.  In the emergency room patient also was noted to be hyperkalemic and she received Kayexalate.   Subjective:    Chad Mcmillan today with any chest pain, shortness of breath, fever or chills.   Assessment  & Plan :    Active Problems:   Hyperkalemia   DM (diabetes mellitus) (HCC)   CKD (chronic kidney disease), stage IV (HCC)   Orthostatic hypotension   Acute on chronic diastolic (congestive) heart failure (HCC)   Congestive heart failure (CHF) (HCC)   Pressure injury of skin  Acute on chronic diastolic CHF -Most recent echo September 2019, with a preserved EF 50 to 30%, and diastolic dysfunction -Presents with volume overload, continue with daily weight, strict  ins and outs, continue with IV Lasix 40 mg IV twice daily, he is -1 L so far, renal function remained stable, continue with current dose Lasix -Follow on repeat 2D echo -Elevated troponins in the setting of CHF  Diabetes mellitus type 2 -Continue to hold home Prandin, continue with insulin sliding  scale during hospital stay  Hyperkalemia -Improved with Kayexalate, continue with IV diuresis, potassium 5.3 today, will give another dose of Kayexalate  Arrhythmias -Thought to have A. fib on admission, has been seen by cardiology, appears to be ventricular arrhythmias, nonsustained VT and multiple VCs, management per cardiology, he is on beta-blockers, repeat echo pending  CKD stage IV -stable, continue to monitor on diuresis  History of nonhemorrhagic CVA -Continue with home aspirin and statin  Hyperlipidemia -Continue with statin.  Pressure ulcer - Pressure Injury 12/19/18 Stage II - Partial thickness loss of dermis presenting as a shallow open ulcer with a red, pink wound bed without slough. Two stage two ulcers; one on coccyx, one on left lower buttocks - Wound / Incision (Open or Dehisced) 12/19/18 Diabetic ulcer Heel Right;Posterior    Code Status : Full  Family Communication  : None at bedside  Disposition Plan  : PT consult Pending  Barriers For Discharge : remains on IV lasix  Consults  :  Cardiology  Procedures  : None   DVT Prophylaxis  :  Chase lovenox  Lab Results  Component Value Date   PLT  12/19/2018    PLATELET CLUMPS NOTED ON SMEAR, COUNT APPEARS DECREASED    Antibiotics  :    Anti-infectives (From admission, onward)   None        Objective:   Vitals:   12/20/18 0353 12/20/18 0355 12/20/18 0812 12/20/18 1157  BP: (!) 150/81  (!) 142/73 (!) 143/72  Pulse:   62 (!) 53  Resp:  18    Temp:  98.1 F (36.7 C)  (!) 97.4 F (36.3 C)  TempSrc:  Oral  Oral  SpO2:  100%  100%  Weight:      Height:        Wt Readings from Last 3 Encounters:    12/20/18 68.8 kg  12/14/18 72.8 kg  11/27/18 67 kg     Intake/Output Summary (Last 24 hours) at 12/20/2018 1205 Last data filed at 12/20/2018 1100 Gross per 24 hour  Intake 1320 ml  Output 2700 ml  Net -1380 ml     Physical Exam  Awake Alert, Oriented X 3, No new F.N deficits, Normal affect, FRAIL Symmetrical Chest wall movement, Good air movement bilaterally, CTAB RRR,No Gallops,Rubs or new Murmurs, No Parasternal Heave +ve B.Sounds, Abd Soft, No tenderness,  No rebound - guarding or rigidity. No Cyanosis, Clubbing or edema, No new Rash or bruise , chronic left arm deformity from fracture    Data Review:    CBC Recent Labs  Lab 12/14/18 1415 12/19/18 1027  WBC 5.6 4.5  HGB 9.6* 8.1*  HCT 29.5* 26.9*  PLT 105.0* PLATELET CLUMPS NOTED ON SMEAR, COUNT APPEARS DECREASED  MCV 98.7 104.3*  MCH  --  31.4  MCHC 32.7 30.1  RDW 13.8 13.4  LYMPHSABS 1.3 1.0  MONOABS 0.3 0.4  EOSABS 0.0 0.0  BASOSABS 0.0 0.0    Chemistries  Recent Labs  Lab 12/14/18 1415 12/19/18 1027 12/19/18 1756 12/20/18 0346  NA 139 142  --  142  K 6.4 No hemolysis seen* 5.7* 5.8* 5.3*  CL 110 117*  --  113*  CO2 20 18*  --  21*  GLUCOSE 189* 157*  --  91  BUN 46* 41*  --  44*  CREATININE 2.20* 2.31*  --  2.50*  CALCIUM 8.4 8.2*  --  8.5*  MG  --  2.0  --   --   AST 71*  82*  --   --   ALT 49 57*  --   --   ALKPHOS 157* 135*  --   --   BILITOT 0.7 0.4  --   --    ------------------------------------------------------------------------------------------------------------------ No results for input(s): CHOL, HDL, LDLCALC, TRIG, CHOLHDL, LDLDIRECT in the last 72 hours.  Lab Results  Component Value Date   HGBA1C 6.5 12/14/2018   ------------------------------------------------------------------------------------------------------------------ Recent Labs    12/19/18 1441  TSH 5.938*    ------------------------------------------------------------------------------------------------------------------ No results for input(s): VITAMINB12, FOLATE, FERRITIN, TIBC, IRON, RETICCTPCT in the last 72 hours.  Coagulation profile No results for input(s): INR, PROTIME in the last 168 hours.  No results for input(s): DDIMER in the last 72 hours.  Cardiac Enzymes Recent Labs  Lab 12/19/18 1441 12/19/18 2005 12/20/18 0346  TROPONINI 0.07* 0.07* 0.12*   ------------------------------------------------------------------------------------------------------------------    Component Value Date/Time   BNP 1,887.1 (H) 12/19/2018 1028    Inpatient Medications  Scheduled Meds: . aspirin EC  81 mg Oral Daily  . atorvastatin  80 mg Oral q1800  . donepezil  5 mg Oral QHS  . enoxaparin (LOVENOX) injection  30 mg Subcutaneous Q24H  . furosemide  40 mg Intravenous BID  . insulin aspart  0-5 Units Subcutaneous QHS  . insulin aspart  0-9 Units Subcutaneous TID WC  . lidocaine-EPINEPHrine  10 mL Intradermal Once  . metoprolol tartrate  25 mg Oral BID  . polyethylene glycol  17 g Oral Daily  . repaglinide  0.5 mg Oral TID AC  . sodium chloride flush  3 mL Intravenous Q12H   Continuous Infusions: . sodium chloride     PRN Meds:.sodium chloride, albuterol, HYDROcodone-acetaminophen, LORazepam, nitroGLYCERIN, ondansetron **OR** ondansetron (ZOFRAN) IV, senna-docusate, sodium chloride flush  Micro Results No results found for this or any previous visit (from the past 240 hour(s)).  Radiology Reports Dg Chest Portable 1 View  Result Date: 12/19/2018 CLINICAL DATA:  Chest pain, shortness of breath. EXAM: PORTABLE CHEST 1 VIEW COMPARISON:  Radiographs of July 18, 2018. FINDINGS: Stable cardiomegaly. Atherosclerosis of thoracic aorta is noted. No pneumothorax is noted. Mild left pleural effusion is noted with associated edema or infiltrate. Right basilar infiltrate or edema is noted with  small right pleural effusion. Subacute to chronic distal left clavicular fracture is noted. IMPRESSION: Bibasilar edema or infiltrates are noted with associated pleural effusions, left greater than right. Subacute to chronic distal left clavicular fracture. Aortic Atherosclerosis (ICD10-I70.0). Electronically Signed   By: Marijo Conception, M.D.   On: 12/19/2018 10:41      Phillips Climes M.D on 12/20/2018 at 12:05 PM  Between 7am to 7pm - Pager - [phone]  After 7pm go to [email or web address] - password [facility]  [facility] -  Office  [phone]

## 2018-12-21 ENCOUNTER — Encounter (HOSPITAL_BASED_OUTPATIENT_CLINIC_OR_DEPARTMENT_OTHER): Payer: Medicare Other | Attending: Internal Medicine

## 2018-12-21 DIAGNOSIS — I10 Essential (primary) hypertension: Secondary | ICD-10-CM | POA: Insufficient documentation

## 2018-12-21 DIAGNOSIS — F0390 Unspecified dementia without behavioral disturbance: Secondary | ICD-10-CM | POA: Insufficient documentation

## 2018-12-21 DIAGNOSIS — I5041 Acute combined systolic (congestive) and diastolic (congestive) heart failure: Secondary | ICD-10-CM

## 2018-12-21 DIAGNOSIS — E1142 Type 2 diabetes mellitus with diabetic polyneuropathy: Secondary | ICD-10-CM | POA: Insufficient documentation

## 2018-12-21 DIAGNOSIS — Z923 Personal history of irradiation: Secondary | ICD-10-CM | POA: Insufficient documentation

## 2018-12-21 DIAGNOSIS — E1140 Type 2 diabetes mellitus with diabetic neuropathy, unspecified: Secondary | ICD-10-CM | POA: Insufficient documentation

## 2018-12-21 DIAGNOSIS — E11621 Type 2 diabetes mellitus with foot ulcer: Secondary | ICD-10-CM | POA: Insufficient documentation

## 2018-12-21 DIAGNOSIS — L97412 Non-pressure chronic ulcer of right heel and midfoot with fat layer exposed: Secondary | ICD-10-CM | POA: Insufficient documentation

## 2018-12-21 LAB — CBC
HCT: 24.6 % — ABNORMAL LOW (ref 39.0–52.0)
Hemoglobin: 7.9 g/dL — ABNORMAL LOW (ref 13.0–17.0)
MCH: 32 pg (ref 26.0–34.0)
MCHC: 32.1 g/dL (ref 30.0–36.0)
MCV: 99.6 fL (ref 80.0–100.0)
Platelets: 97 10*3/uL — ABNORMAL LOW (ref 150–400)
RBC: 2.47 MIL/uL — AB (ref 4.22–5.81)
RDW: 13.7 % (ref 11.5–15.5)
WBC: 4.7 10*3/uL (ref 4.0–10.5)
nRBC: 0 % (ref 0.0–0.2)

## 2018-12-21 LAB — BASIC METABOLIC PANEL
ANION GAP: 8 (ref 5–15)
BUN: 53 mg/dL — ABNORMAL HIGH (ref 8–23)
CO2: 23 mmol/L (ref 22–32)
Calcium: 7.9 mg/dL — ABNORMAL LOW (ref 8.9–10.3)
Chloride: 110 mmol/L (ref 98–111)
Creatinine, Ser: 2.61 mg/dL — ABNORMAL HIGH (ref 0.61–1.24)
GFR calc Af Amer: 24 mL/min — ABNORMAL LOW (ref >60)
GFR calc non Af Amer: 20 mL/min — ABNORMAL LOW (ref >60)
Glucose, Bld: 92 mg/dL (ref 70–99)
Potassium: 4.7 mmol/L (ref 3.5–5.1)
SODIUM: 141 mmol/L (ref 135–145)

## 2018-12-21 LAB — GLUCOSE, CAPILLARY
Glucose-Capillary: 86 mg/dL (ref 70–99)
Glucose-Capillary: 148 mg/dL — ABNORMAL HIGH (ref 70–99)
Glucose-Capillary: 229 mg/dL — ABNORMAL HIGH (ref 70–99)
Glucose-Capillary: 157 mg/dL — ABNORMAL HIGH (ref 70–99)

## 2018-12-21 LAB — PREPARE RBC (CROSSMATCH)

## 2018-12-21 MED ORDER — AMLODIPINE BESYLATE 5 MG PO TABS: 5.0000 mg | ORAL_TABLET | Freq: Every day | ORAL | Status: DC

## 2018-12-21 MED ORDER — ISOSORB DINITRATE-HYDRALAZINE 20-37.5 MG PO TABS
1.0000 | ORAL_TABLET | Freq: Three times a day (TID) | ORAL | Status: DC
  Administered 2018-12-21 – 2018-12-22 (×3): 1 via ORAL
  Filled 2018-12-21 (×3): qty 1

## 2018-12-21 MED ORDER — SODIUM CHLORIDE 0.9% IV SOLUTION: Freq: Once | INTRAVENOUS | Status: DC

## 2018-12-21 NOTE — Evaluation (Signed)
Physical Therapy Evaluation Patient Details Name: Chad Mcmillan MRN: [id number] DOB: February 07, 1924 Today's Date: 12/21/2018   History of Present Illness  Patient is a 85 y/o make who presents with weakness, dizziness, dyspnea and fatigue. Found to have nonsustained VT. CXR-CHF exacerbation. PMH includes CAD, CVA, DM, HTN, CKD, chronic anemia, thrombocytopenia, dementia.   Clinical Impression  Patient presents with generalized weakness, dyspnea on exertion, impaired balance and impaired mobility s/p above. Tolerated transfers and gait training with Min A for balance/safety. VSS throughout. Pt lives with wife who has Alzheimer's and reports being Mod I for ADls. Gets assist from an aide 5 days/week for 4 hours/day and family. Reports multiple falls at home. Pt continues to be a a high fall risk. Will follow acutely to maximize independence and mobility prior to return home.    Follow Up Recommendations Home health PT;Supervision for mobility/OOB;Supervision/Assistance - 24 hour    Equipment Recommendations  None recommended by PT    Recommendations for Other Services       Precautions / Restrictions Precautions Precautions: Fall Precaution Comments: Reports hx of falls Restrictions Weight Bearing Restrictions: No      Mobility  Bed Mobility Overal bed mobility: Needs Assistance Bed Mobility: Rolling;Sidelying to Sit Rolling: Supervision Sidelying to sit: Supervision;HOB elevated       General bed mobility comments: Use of rail but no physical assist needed.   Transfers Overall transfer level: Needs assistance Equipment used: Rolling walker (2 wheeled) Transfers: Sit to/from Stand Sit to Stand: Min guard         General transfer comment: Min guard for safety. Multiple attempts to come to standing.   Ambulation/Gait Ambulation/Gait assistance: Min assist;Min guard Gait Distance (Feet): 150 Feet Assistive device: Rolling walker (2 wheeled) Gait Pattern/deviations:  Step-through pattern;Decreased stride length;Trunk flexed;Shuffle Gait velocity: decreased   General Gait Details: Slow, mildly unsteady gait with forward flexed posture; 2/4 DOE Sp02 remained in mid 90s on RA and HR ranged from 50s-70s bpm.  Stairs            Wheelchair Mobility    Modified Rankin (Stroke Patients Only)       Balance Overall balance assessment: Needs assistance Sitting-balance support: Feet supported;No upper extremity supported Sitting balance-Leahy Scale: Fair     Standing balance support: During functional activity Standing balance-Leahy Scale: Fair Standing balance comment: Able to stand statically with close min guard with sway noted, does better with BUE support for dynamic tasks.                              Pertinent Vitals/Pain Pain Assessment: No/denies pain    Home Living Family/patient expects to be discharged to:: Private residence Living Arrangements: Spouse/significant other Available Help at Discharge: Family;Available 24 hours/day Type of Home: House Home Access: Ramped entrance;Stairs to enter   Entrance Stairs-Number of Steps: 1 + 2 Home Layout: One level Home Equipment: Walker - 4 wheels;Cane - single point;Toilet riser;Walker - 2 wheels;Shower seat;Wheelchair - manual Additional Comments: Pt reports he is the primary caregiver for his wife (who has dementia) and has help from his two sons.     Prior Function Level of Independence: Independent with assistive device(s)         Comments: Uses RW for ambulation. Has aide come in 5 days per week for 4 hrs per day to help with IADLs- cooking. cleaning.      Hand Dominance   Dominant Hand: Right  Extremity/Trunk Assessment   Upper Extremity Assessment Upper Extremity Assessment: Defer to OT evaluation    Lower Extremity Assessment Lower Extremity Assessment: Generalized weakness    Cervical / Trunk Assessment Cervical / Trunk Assessment: Kyphotic   Communication   Communication: HOH  Cognition Arousal/Alertness: Awake/alert Behavior During Therapy: WFL for tasks assessed/performed Overall Cognitive Status: No family/caregiver present to determine baseline cognitive functioning                                 General Comments: Emotional talking about his wife's Alzhemier's and how people keep trying to get him to go to a SNF.  Question safety awareness, hx of falls at home.      General Comments General comments (skin integrity, edema, etc.): Bp soft in general but asymptomatic with mobility and changes in position. Supine BP 104/62, sitting BP 103/57, post activity BP 149/57    Exercises     Assessment/Plan    PT Assessment Patient needs continued PT services  PT Problem List Decreased strength;Decreased mobility;Decreased balance;Cardiopulmonary status limiting activity;Decreased activity tolerance;Decreased cognition;Decreased knowledge of use of DME       PT Treatment Interventions Functional mobility training;Balance training;Patient/family education;Gait training;Therapeutic activities;Therapeutic exercise;Stair training;DME instruction    PT Goals (Current goals can be found in the Care Plan section)  Acute Rehab PT Goals Patient Stated Goal: to go home to be with my wife PT Goal Formulation: With patient Time For Goal Achievement: 01/04/19 Potential to Achieve Goals: Fair    Frequency Min 3X/week   Barriers to discharge Decreased caregiver support      Co-evaluation               AM-PAC PT "6 Clicks" Mobility  Outcome Measure Help needed turning from your back to your side while in a flat bed without using bedrails?: A Little Help needed moving from lying on your back to sitting on the side of a flat bed without using bedrails?: A Little Help needed moving to and from a bed to a chair (including a wheelchair)?: A Little Help needed standing up from a chair using your arms (e.g.,  wheelchair or bedside chair)?: A Little Help needed to walk in hospital room?: A Little Help needed climbing 3-5 steps with a railing? : A Lot 6 Click Score: 17    End of Session Equipment Utilized During Treatment: Gait belt Activity Tolerance: Patient tolerated treatment well;Patient limited by fatigue Patient left: in bed;with call bell/phone within reach;with bed alarm set Nurse Communication: Mobility status PT Visit Diagnosis: Unsteadiness on feet (R26.81);Difficulty in walking, not elsewhere classified (R26.2)    Time: 5537-4827 PT Time Calculation (min) (ACUTE ONLY): 27 min   Charges:   PT Evaluation $PT Eval Low Complexity: 1 Low PT Treatments $Gait Training: 8-22 mins        Wray Kearns, [place], DPT Acute Rehabilitation Services Pager [phone] Office [place] 12/21/2018, 4:00 PM

## 2018-12-21 NOTE — Progress Notes (Signed)
PROGRESS NOTE                                                                                                                                                                                                             Patient Demographics:    Chad Mcmillan, is a 85 y.o. male, DOB - 02-11-1924, TMH:[id number]  Admit date - 12/19/2018   Admitting Physician Merton Border, MD  Outpatient Primary MD for the patient is Binnie Rail, MD  LOS - 2   Chief Complaint  Patient presents with  . Weakness       Brief Narrative    Chad Mcmillan  is a 85 y.o. male, with past medical history significant for CAD, CVA, diabetes mellitus type 2 , hypertension, HLD and diastolic congestive heart failure status post echo on 07/2018 with ejection fraction of 50 to 55% presenting with worsening weakness and shortness of breath for the last few weeks.  Today the shortness of breath has increased significantly.  Patient had his Lasix stopped on 22 January by cardiology due to renal insufficiency.  Patient denies any chest pains nausea or vomiting.  Patient was seen in the emergency room and his EKG showed normal sinus rhythm with PVCs.  Cardiology saw and evaluated the patient and advised beta-blockers and Lasix IV for now.  In the emergency room patient also was noted to be hyperkalemic and she received Kayexalate.   Subjective:    [name] today denies any chest pain, shortness of breath, fever or chills  Assessment  & Plan :    Active Problems:   Hyperkalemia   DM (diabetes mellitus) (HCC)   CKD (chronic kidney disease), stage IV (HCC)   Orthostatic hypotension   Acute on chronic diastolic (congestive) heart failure (HCC)   Congestive heart failure (CHF) (HCC)   Pressure injury of skin  Acute on chronic diastolic /systolic CHF -Most recent echo September 2019, with a preserved EF 50 to 92%, and diastolic dysfunction -Echo during hospital stay showing severe drop in his EF to  25%, with global hypokinesis -Volume status improving with IV diuresis, and is 1.4 L over last 24 hours, indeterminate renal function closely giving advanced CKD. -Troponins are elevated, but non-ACS pattern, further management per cardiology -Is on beta-blockers, no room to increase in the setting of bradycardia, blood pressure mildly elevated,  I will start on BiDil. -We will await further recommendation from cardiology regarding drop in EF  Diabetes mellitus type 2 -Continue to hold home Prandin, continue with insulin sliding scale during hospital stay  Hyperkalemia -Required Kayexalate, as well improved with IV Lasix, monitor closely  Arrhythmias -Thought to have A. fib on admission, has been seen by cardiology, appears to be ventricular arrhythmias, nonsustained VT and multiple VCs, management per cardiology, he is on beta-blockers, repeat echo pending  CKD stage IV -stable, continue to monitor on diuresis  History of nonhemorrhagic CVA -Continue with home aspirin and statin  Hyperlipidemia -Continue with statin.  Pressure ulcer - Pressure Injury 12/19/18 Stage II - Partial thickness loss of dermis presenting as a shallow open ulcer with a red, pink wound bed without slough. Two stage two ulcers; one on coccyx, one on left lower buttocks - Wound / Incision (Open or Dehisced) 12/19/18 Diabetic ulcer Heel Right;Posterior  Anemia of chronic kidney disease -Sent with known chronic anemia extensive work-up in the past, his anemia was felt secondary to his chronic kidney disease, actually anemia and thrombocytopenia were the main reason he was not started on anticoagulation per cardiology for his paroxysmal A. fib in the past. -Given his positive troponins, like to keep his hemoglobin more than 9, I will transfuse 1 unit PRBC today.  Code Status : Full  Family Communication  : None at bedside  Disposition Plan  : PT consult Pending  Barriers For Discharge : remains on IV  lasix  Consults  :  Cardiology  Procedures  : None   DVT Prophylaxis  :   lovenox  Lab Results  Component Value Date   PLT 97 (L) 12/21/2018    Antibiotics  :    Anti-infectives (From admission, onward)   None        Objective:   Vitals:   12/20/18 2056 12/20/18 2203 12/21/18 0523 12/21/18 0954  BP: (!) 153/56 (!) 153/56 (!) 161/60 (!) 150/58  Pulse: (!) 55 62 (!) 52 63  Resp:      Temp: 98.3 F (36.8 C)  97.6 F (36.4 C)   TempSrc: Oral  Oral   SpO2: 100%  100%   Weight:   67.9 kg   Height:        Wt Readings from Last 3 Encounters:  12/21/18 67.9 kg  12/14/18 72.8 kg  11/27/18 67 kg     Intake/Output Summary (Last 24 hours) at 12/21/2018 1101 Last data filed at 12/21/2018 0600 Gross per 24 hour  Intake 540 ml  Output 1800 ml  Net -1260 ml     Physical Exam  Awake Alert, Oriented X 3, No new F.N deficits, Normal affect Symmetrical Chest wall movement, Good air movement bilaterally, CTAB RRR,No Gallops,Rubs or new Murmurs, No Parasternal Heave +ve B.Sounds, Abd Soft, No tenderness, No rebound - guarding or rigidity. No Cyanosis, Clubbing or edema, No new Rash or bruise , chronic left arm deformity from fracture    Data Review:    CBC Recent Labs  Lab 12/14/18 1415 12/19/18 1027 12/21/18 0355  WBC 5.6 4.5 4.7  HGB 9.6* 8.1* 7.9*  HCT 29.5* 26.9* 24.6*  PLT 105.0* PLATELET CLUMPS NOTED ON SMEAR, COUNT APPEARS DECREASED 97*  MCV 98.7 104.3* 99.6  MCH  --  31.4 32.0  MCHC 32.7 30.1 32.1  RDW 13.8 13.4 13.7  LYMPHSABS 1.3 1.0  --   MONOABS 0.3 0.4  --   EOSABS 0.0 0.0  --   BASOSABS  0.0 0.0  --     Chemistries  Recent Labs  Lab 12/14/18 1415 12/19/18 1027 12/19/18 1756 12/20/18 0346 12/21/18 0355  NA 139 142  --  142 141  K 6.4 No hemolysis seen* 5.7* 5.8* 5.3* 4.7  CL 110 117*  --  113* 110  CO2 20 18*  --  21* 23  GLUCOSE 189* 157*  --  91 92  BUN 46* 41*  --  44* 53*  CREATININE 2.20* 2.31*  --  2.50* 2.61*  CALCIUM 8.4  8.2*  --  8.5* 7.9*  MG  --  2.0  --   --   --   AST 71* 82*  --   --   --   ALT 49 57*  --   --   --   ALKPHOS 157* 135*  --   --   --   BILITOT 0.7 0.4  --   --   --    ------------------------------------------------------------------------------------------------------------------ No results for input(s): CHOL, HDL, LDLCALC, TRIG, CHOLHDL, LDLDIRECT in the last 72 hours.  Lab Results  Component Value Date   HGBA1C 6.5 12/14/2018   ------------------------------------------------------------------------------------------------------------------ Recent Labs    12/19/18 1441  TSH 5.938*   ------------------------------------------------------------------------------------------------------------------ No results for input(s): VITAMINB12, FOLATE, FERRITIN, TIBC, IRON, RETICCTPCT in the last 72 hours.  Coagulation profile No results for input(s): INR, PROTIME in the last 168 hours.  No results for input(s): DDIMER in the last 72 hours.  Cardiac Enzymes Recent Labs  Lab 12/19/18 1441 12/19/18 2005 12/20/18 0346  TROPONINI 0.07* 0.07* 0.12*   ------------------------------------------------------------------------------------------------------------------    Component Value Date/Time   BNP 1,887.1 (H) 12/19/2018 1028    Inpatient Medications  Scheduled Meds: . sodium chloride   Intravenous Once  . amLODipine  5 mg Oral Daily  . aspirin EC  81 mg Oral Daily  . atorvastatin  80 mg Oral q1800  . donepezil  5 mg Oral QHS  . enoxaparin (LOVENOX) injection  30 mg Subcutaneous Q24H  . furosemide  40 mg Intravenous BID  . insulin aspart  0-5 Units Subcutaneous QHS  . insulin aspart  0-9 Units Subcutaneous TID WC  . lidocaine-EPINEPHrine  10 mL Intradermal Once  . metoprolol tartrate  25 mg Oral BID  . polyethylene glycol  17 g Oral Daily  . sodium chloride flush  3 mL Intravenous Q12H   Continuous Infusions: . sodium chloride     PRN Meds:.sodium chloride,  albuterol, HYDROcodone-acetaminophen, LORazepam, nitroGLYCERIN, ondansetron **OR** ondansetron (ZOFRAN) IV, senna-docusate, sodium chloride flush  Micro Results No results found for this or any previous visit (from the past 240 hour(s)).  Radiology Reports Dg Chest Portable 1 View  Result Date: 12/19/2018 CLINICAL DATA:  Chest pain, shortness of breath. EXAM: PORTABLE CHEST 1 VIEW COMPARISON:  Radiographs of July 18, 2018. FINDINGS: Stable cardiomegaly. Atherosclerosis of thoracic aorta is noted. No pneumothorax is noted. Mild left pleural effusion is noted with associated edema or infiltrate. Right basilar infiltrate or edema is noted with small right pleural effusion. Subacute to chronic distal left clavicular fracture is noted. IMPRESSION: Bibasilar edema or infiltrates are noted with associated pleural effusions, left greater than right. Subacute to chronic distal left clavicular fracture. Aortic Atherosclerosis (ICD10-I70.0). Electronically Signed   By: Marijo Conception, M.D.   On: 12/19/2018 10:41      Phillips Climes M.D on 12/21/2018 at 11:01 AM  Between 7am to 7pm - Pager - [phone]  After 7pm go to [email or web address] - password [facility]  [facility] -  Office  [phone]

## 2018-12-21 NOTE — Progress Notes (Addendum)
Progress Note  Patient Name: Chad Mcmillan Date of Encounter: 12/21/2018  Primary Cardiologist: Dorris Carnes, MD   Subjective   Patient states that since admission is shortness of breath have significantly improved. He tells me that for the past 3 to 4 months he has experienced progressive dyspnea with exertion. This has limited his ability to perform his ADLs. He previously could not even walk to the bathroom without becoming severely short of breath and needing to rest. He feels this is approved since admission and now he is able to walk to the bathroom without needing to rest. He was also having generalized weakness on admission which he feels is back to normal.  Inpatient Medications    Scheduled Meds: . sodium chloride   Intravenous Once  . aspirin EC  81 mg Oral Daily  . atorvastatin  80 mg Oral q1800  . donepezil  5 mg Oral QHS  . enoxaparin (LOVENOX) injection  30 mg Subcutaneous Q24H  . furosemide  40 mg Intravenous BID  . insulin aspart  0-5 Units Subcutaneous QHS  . insulin aspart  0-9 Units Subcutaneous TID WC  . isosorbide-hydrALAZINE  1 tablet Oral TID  . lidocaine-EPINEPHrine  10 mL Intradermal Once  . metoprolol tartrate  25 mg Oral BID  . polyethylene glycol  17 g Oral Daily  . sodium chloride flush  3 mL Intravenous Q12H   Continuous Infusions: . sodium chloride     PRN Meds: sodium chloride, albuterol, HYDROcodone-acetaminophen, LORazepam, nitroGLYCERIN, ondansetron **OR** ondansetron (ZOFRAN) IV, senna-docusate, sodium chloride flush   Vital Signs    Vitals:   12/21/18 0523 12/21/18 0954 12/21/18 1205 12/21/18 1330  BP: (!) 161/60 (!) 150/58 (!) 125/56 (!) 95/47  Pulse: (!) 52 63 68 72  Resp:    19  Temp: 97.6 F (36.4 C)  98.3 F (36.8 C) 97.6 F (36.4 C)  TempSrc: Oral  Oral Oral  SpO2: 100%  100% 100%  Weight: 67.9 kg     Height:        Intake/Output Summary (Last 24 hours) at 12/21/2018 1344 Last data filed at 12/21/2018 1330 Gross per 24 hour    Intake 881 ml  Output 1800 ml  Net -919 ml   Filed Weights   12/19/18 1419 12/20/18 0348 12/21/18 0523  Weight: 71.6 kg 68.8 kg 67.9 kg   Telemetry    Sinus bradycardia with PR prolongation, PVCs, and sinus arrhyhtmia - Personally Reviewed  ECG    No new EKG.  Physical Exam   Today's Vitals   12/21/18 0954 12/21/18 1045 12/21/18 1205 12/21/18 1330  BP: (!) 150/58  (!) 125/56 (!) 95/47  Pulse: 63  68 72  Resp:    19  Temp:   98.3 F (36.8 C) 97.6 F (36.4 C)  TempSrc:   Oral Oral  SpO2:   100% 100%  Weight:      Height:      PainSc:  0-No pain     Body mass index is 20.3 kg/m.   GEN: No acute distress.   Neck: No JVD Cardiac: Bradycardic, no murmurs, rubs, or gallops.  Respiratory: Clear to auscultation bilaterally. GI: Soft, nontender, non-distended  MS: No edema; No deformity. Neuro:  Nonfocal  Psych: Normal affect   Labs    Chemistry Recent Labs  Lab 12/14/18 1415 12/19/18 1027 12/19/18 1756 12/20/18 0346 12/21/18 0355  NA 139 142  --  142 141  K 6.4 No hemolysis seen* 5.7* 5.8* 5.3* 4.7  CL 110 117*  --  113* 110  CO2 20 18*  --  21* 23  GLUCOSE 189* 157*  --  91 92  BUN 46* 41*  --  44* 53*  CREATININE 2.20* 2.31*  --  2.50* 2.61*  CALCIUM 8.4 8.2*  --  8.5* 7.9*  PROT 5.9* 5.4*  --   --   --   ALBUMIN 3.2* 2.6*  --   --   --   AST 71* 82*  --   --   --   ALT 49 57*  --   --   --   ALKPHOS 157* 135*  --   --   --   BILITOT 0.7 0.4  --   --   --   GFRNONAA  --  24*  --  21* 20*  GFRAA  --  27*  --  25* 24*  ANIONGAP  --  7  --  8 8    Hematology Recent Labs  Lab 12/14/18 1415 12/19/18 1027 12/21/18 0355  WBC 5.6 4.5 4.7  RBC 2.99* 2.58* 2.47*  HGB 9.6* 8.1* 7.9*  HCT 29.5* 26.9* 24.6*  MCV 98.7 104.3* 99.6  MCH  --  31.4 32.0  MCHC 32.7 30.1 32.1  RDW 13.8 13.4 13.7  PLT 105.0* PLATELET CLUMPS NOTED ON SMEAR, COUNT APPEARS DECREASED 97*   Cardiac Enzymes Recent Labs  Lab 12/19/18 1441 12/19/18 2005 12/20/18 0346   TROPONINI 0.07* 0.07* 0.12*    Recent Labs  Lab 12/19/18 1039  TROPIPOC 0.08    BNP Recent Labs  Lab 12/14/18 1415 12/19/18 1028  BNP  --  1,887.1*  PROBNP 2,511.0*  --     DDimer No results for input(s): DDIMER in the last 168 hours.   Radiology    No results found.  Cardiac Studies   Left Heart Cath 2000 - LAD 50%, 70%, Diag 90% LCX 70 to 80%. LVEF 70  Echocardiogram 12/20/2018   1. The left ventricle has severely reduced systolic function of 75-10%. The cavity size is mildly increased. There is mild left ventricular wall thickness. Echo evidence of impaired relaxation diastolic filling patterns. Indeterminent filling pressures.  2. Global hypokinesis.  3. Moderately dilated left atrial size.  4. Normal right atrial size.  5. The mitral valve normal in structure. Regurgitation is mild by color flow Doppler.  6. Normal tricuspid valve.  7. The aortic valve tricuspid. There is mild thickening and mild calcification of the aortic valve.  8. The ascending aorta and aortic rootare normal is size and structure.  9. No atrial level shunt detected by color flow Doppler.  Patient Profile     85 y.o. male with known CAD, previously HFpEF, paroxysmal atrial fibrillation, hypertension, diabetes, and hyperlipidemia who presented to the emergency department with progressive exertional dyspnea and volume overload. He was subsequently admitted for an acute on chronic heart failure exacerbation.  Assessment & Plan    Acute HFrEF CAD s/p CABG (no sternotomy wires on CXR) - Symptoms have improved since admission. NYHA class III-IV.   - Repeat echocardiogram with LVEF 25-30% and diffuse hypokinesis - Net negative 2.0L since admission and weight is down 3.7 kg  - Renal function and potassium stable. Bicarb and BUN uptrending  - Appear euvolemic on PE. Would switch to PO lasix - Switch metoprolol tartrate to metoprolol succinate. Careful titration with 1st degree AV block - Continue  Bidil  - Unable to add ACE/ARB at this point due to CKD Stage IV -  Would not add spironolactone at this point given hyperkalemia and CKD  - QRS is wide but patient's age, comorbidies, and NYHA class may make him ineligible for CRT  - Based on prior cardiac cath this is likely ischemic cardiomyopathy; however, on admission he did have high PVC burden and therefore there may be an aspect of tachycardia induced cardiomyopathy  Paroxysmal Atrial Fibrillation  - Currently in sinus  - Switch metoprolol as discussed above  - Not currently on anticoagulation. CHADsVASC 7. HASBLED 3.   HTN - BP soft this AM  - Switch to PO lasix  - Continue BiDil and switch metoprolol as discussed above  HLD - Continue atorvastatin 80 mg QD  For questions or updates, please contact [facility] Please consult [email or web address] for contact info under Cardiology/STEMI.     Signed, Ina Homes, MD  12/21/2018, 1:44 PM    ---------------------------------------------------------------------------------------------   History and all data above reviewed.  Patient examined.  I agree with the findings as above.  TAVISH GETTIS is a pleasant 85 year old gentleman who we are seeing in consultation for heart failure with a newly reduced ejection fraction and a history of coronary artery disease.  He had a coronary angiogram in year 2000 where he had an LAD 50% and 70% a significant diagonal lesion and severe disease in his left circumflex.  I do not see sternotomy wires on his chest x-ray, and there is no clear sternotomy scar on his chest.  He is currently chest pain free.   Constitutional: No acute distress Cardiovascular: regular rhythm, normal rate, no murmurs. S1 and S2 normal. Radial pulses normal bilaterally. No jugular venous distention seated upright.  Respiratory: clear to auscultation bilaterally GI : normal bowel sounds, soft and nontender. No distention.   MSK: extremities warm, well perfused. No edema.    NEURO: grossly nonfocal exam, moves all extremities. PSYCH: alert and oriented x 3, normal mood and affect.   All available labs, radiology testing, previous records reviewed. Agree with documented assessment and plan of my colleague as stated above with the following additions or changes:  Active Problems:   Hyperkalemia   DM (diabetes mellitus) (HCC)   CKD (chronic kidney disease), stage IV (HCC)   Orthostatic hypotension   Acute on chronic diastolic (congestive) heart failure (HCC)   Congestive heart failure (CHF) (HCC)   Pressure injury of skin    Plan: I had a detailed discussion with the patient regarding next steps with regard to a reduced ejection fraction.  We discussed that with worsening renal function and no desire for dialysis, coronary angiogram with likely PCI may be more contrast load then his kidneys would be able to tolerate, and may necessitate further interventions for renal failure.  The patient has expressed in no uncertain terms that he does not want any "heart operations", we discussed coronary angiogram again, and the patient still states that he would not like a procedure that may put his kidneys at risk.  We discussed that medical therapy can be used. It is most likely that his reduced ejection fraction is ischemic in nature.  He confirmed for me that he makes his own medical decisions though he does have power of attorney and his son.  I think it is reasonable to defer cath at this time and focus on optimal medical management.  We will transition to metoprolol succinate, however his heart rates are already slightly low, and I believe it may be beneficial to decrease the dose from what he is  currently receiving.  We can initiate this tomorrow.  A dose of metoprolol succinate 25 mg daily may be adequate.  We will plan for oral diuresis starting tomorrow, 40 mg p.o. twice daily will likely be an adequate home-going dose.  The patient became tearful during our discussion,  but feels it would be best to take a conservative approach without coronary angiography or PCI.  We participated in shared decision making and I believe that this is reasonable.  He understands the natural history of reduced ejection fraction and possible recurrent heart failure, but would rather take a medication route as opposed to a procedure route.  Time Spent Directly with Patient:  I have spent a total of 35 minutes with the patient reviewing hospital notes, telemetry, EKGs, labs and examining the patient as well as establishing an assessment and plan that was discussed personally with the patient.  > 50% of time was spent in direct patient care.  Length of Stay:  LOS: 2 days   Elouise Munroe, MD [facility] 5:21 PM  12/21/2018

## 2018-12-21 NOTE — Progress Notes (Signed)
Pharmacist Heart Failure Core Measure Documentation  Assessment: RED MANDT has an EF documented as 25-30% on 12/20/18 by ECHO.  Rationale: Heart failure patients with left ventricular systolic dysfunction (LVSD) and an EF < 40% should be prescribed an angiotensin converting enzyme inhibitor (ACEI) or angiotensin receptor blocker (ARB) at discharge unless a contraindication is documented in the medical record.  This patient is not currently on an ACEI or ARB for HF.  This note is being placed in the record in order to provide documentation that a contraindication to the use of these agents is present for this encounter.  ACE Inhibitor or Angiotensin Receptor Blocker is contraindicated (specify all that apply)  []   ACEI allergy AND ARB allergy []   Angioedema []   Moderate or severe aortic stenosis []   Hyperkalemia []   Hypotension []   Renal artery stenosis [x]   Worsening renal function, preexisting renal disease or dysfunction   Razi Hickle D. Mina Marble, PharmD, BCPS, [place] 12/21/2018, 11:18 AM

## 2018-12-21 NOTE — Progress Notes (Signed)
PT Cancellation Note  Patient Details Name: Chad Mcmillan MRN: [id number] DOB: 09-16-1924   Cancelled Treatment:    Reason Eval/Treat Not Completed: Medical issues which prohibited therapy Pt with upward trending troponin. Will await clearance from cardiology or downward trend or troponin prior to PT evaluation per protocol. Will follow.   [place] 12/21/2018, 7:07 AM Wray Kearns, PT, DPT Acute Rehabilitation Services Pager [phone] Office [phone]

## 2018-12-22 DIAGNOSIS — I5023 Acute on chronic systolic (congestive) heart failure: Secondary | ICD-10-CM

## 2018-12-22 LAB — GLUCOSE, CAPILLARY
Glucose-Capillary: 102 mg/dL — ABNORMAL HIGH (ref 70–99)
Glucose-Capillary: 143 mg/dL — ABNORMAL HIGH (ref 70–99)

## 2018-12-22 LAB — TYPE AND SCREEN
ABO/RH(D): A POS
ANTIBODY SCREEN: NEGATIVE
Unit division: 0

## 2018-12-22 LAB — BASIC METABOLIC PANEL
Anion gap: 10 (ref 5–15)
BUN: 66 mg/dL — ABNORMAL HIGH (ref 8–23)
CALCIUM: 7.8 mg/dL — AB (ref 8.9–10.3)
CHLORIDE: 105 mmol/L (ref 98–111)
CO2: 23 mmol/L (ref 22–32)
CREATININE: 2.92 mg/dL — AB (ref 0.61–1.24)
GFR calc Af Amer: 21 mL/min — ABNORMAL LOW (ref >60)
GFR calc non Af Amer: 18 mL/min — ABNORMAL LOW (ref >60)
Glucose, Bld: 112 mg/dL — ABNORMAL HIGH (ref 70–99)
Potassium: 4.6 mmol/L (ref 3.5–5.1)
Sodium: 138 mmol/L (ref 135–145)

## 2018-12-22 LAB — BPAM RBC
Blood Product Expiration Date: 202002192359
ISSUE DATE / TIME: 202002031320
Unit Type and Rh: 6200

## 2018-12-22 LAB — HEMOGLOBIN AND HEMATOCRIT, BLOOD
HCT: 26.6 % — ABNORMAL LOW (ref 39.0–52.0)
Hemoglobin: 8.4 g/dL — ABNORMAL LOW (ref 13.0–17.0)

## 2018-12-22 MED ORDER — METOPROLOL TARTRATE 25 MG PO TABS: 25.0000 mg | ORAL_TABLET | Freq: Two times a day (BID) | ORAL | 0 refills | Status: DC

## 2018-12-22 MED ORDER — FUROSEMIDE 40 MG PO TABS: 40.0000 mg | ORAL_TABLET | Freq: Every day | ORAL | 0 refills | Status: DC

## 2018-12-22 MED ORDER — ISOSORB DINITRATE-HYDRALAZINE 20-37.5 MG PO TABS: 1.0000 | ORAL_TABLET | Freq: Three times a day (TID) | ORAL | 0 refills | Status: DC

## 2018-12-22 MED ORDER — FUROSEMIDE 40 MG PO TABS: 40.0000 mg | ORAL_TABLET | Freq: Two times a day (BID) | ORAL | Status: DC

## 2018-12-22 NOTE — Care Management Important Message (Signed)
Important Message  Patient Details  Name: Chad Mcmillan MRN: [id number] Date of Birth: 19-Aug-1924   Medicare Important Message Given:  Yes    [place] 12/22/2018, 5:17 PM

## 2018-12-22 NOTE — Progress Notes (Addendum)
Progress Note  Patient Name: Chad Mcmillan Date of Encounter: 12/22/2018  Primary Cardiologist: Dorris Carnes, MD   Subjective   Patient feeling well this AM. Feels his breathing is improved. He tells me that the plan is for discharge home today. Again re-iterates that he would like medical management as opposed to revascularization options. Discussed the plan to adjust his medications and he will need to follow-up with cardiology as an outpatient. He voices understanding.   Inpatient Medications    Scheduled Meds: . sodium chloride   Intravenous Once  . aspirin EC  81 mg Oral Daily  . atorvastatin  80 mg Oral q1800  . donepezil  5 mg Oral QHS  . enoxaparin (LOVENOX) injection  30 mg Subcutaneous Q24H  . furosemide  40 mg Intravenous BID  . insulin aspart  0-5 Units Subcutaneous QHS  . insulin aspart  0-9 Units Subcutaneous TID WC  . isosorbide-hydrALAZINE  1 tablet Oral TID  . lidocaine-EPINEPHrine  10 mL Intradermal Once  . metoprolol tartrate  25 mg Oral BID  . polyethylene glycol  17 g Oral Daily  . sodium chloride flush  3 mL Intravenous Q12H   Continuous Infusions: . sodium chloride     PRN Meds: sodium chloride, albuterol, HYDROcodone-acetaminophen, LORazepam, nitroGLYCERIN, ondansetron **OR** ondansetron (ZOFRAN) IV, senna-docusate, sodium chloride flush   Vital Signs    Vitals:   12/21/18 1345 12/21/18 1702 12/21/18 2049 12/22/18 0525  BP: (!) 96/50  (!) 127/44 (!) 116/55  Pulse: 63 69 64 63  Resp: 20 20    Temp: (!) 97.5 F (36.4 C) 97.8 F (36.6 C) 98.4 F (36.9 C) 98.4 F (36.9 C)  TempSrc: Oral Oral Oral Oral  SpO2: 100% 97% 100% 97%  Weight:    68.2 kg  Height:        Intake/Output Summary (Last 24 hours) at 12/22/2018 0828 Last data filed at 12/22/2018 0600 Gross per 24 hour  Intake 1361 ml  Output 1885 ml  Net -524 ml   Filed Weights   12/20/18 0348 12/21/18 0523 12/22/18 0525  Weight: 68.8 kg 67.9 kg 68.2 kg   Telemetry    Sinus bradycardia  with PR prolongation, PVCs, and sinus arrhyhtmia - Personally Reviewed  ECG    No new EKG.  Physical Exam   Today's Vitals   12/21/18 2000 12/21/18 2049 12/22/18 0400 12/22/18 0525  BP:  (!) 127/44  (!) 116/55  Pulse:  64  63  Resp:      Temp:  98.4 F (36.9 C)  98.4 F (36.9 C)  TempSrc:  Oral  Oral  SpO2:  100%  97%  Weight:    68.2 kg  Height:      PainSc: 0-No pain  Asleep    Body mass index is 20.4 kg/m.   GEN: No acute distress.   Neck: No JVD Cardiac: Bradycardic, no murmurs, rubs, or gallops.  Respiratory: Clear to auscultation bilaterally. GI: Soft, nontender, non-distended  MS: No edema; No deformity. Neuro:  Nonfocal  Psych: Normal affect   Labs    Chemistry Recent Labs  Lab 12/19/18 1027  12/20/18 0346 12/21/18 0355 12/22/18 0359  NA 142  --  142 141 138  K 5.7*   < > 5.3* 4.7 4.6  CL 117*  --  113* 110 105  CO2 18*  --  21* 23 23  GLUCOSE 157*  --  91 92 112*  BUN 41*  --  44* 53* 66*  CREATININE 2.31*  --  2.50* 2.61* 2.92*  CALCIUM 8.2*  --  8.5* 7.9* 7.8*  PROT 5.4*  --   --   --   --   ALBUMIN 2.6*  --   --   --   --   AST 82*  --   --   --   --   ALT 57*  --   --   --   --   ALKPHOS 135*  --   --   --   --   BILITOT 0.4  --   --   --   --   GFRNONAA 24*  --  21* 20* 18*  GFRAA 27*  --  25* 24* 21*  ANIONGAP 7  --  8 8 10    < > = values in this interval not displayed.    Hematology Recent Labs  Lab 12/19/18 1027 12/21/18 0355 12/22/18 0359  WBC 4.5 4.7  --   RBC 2.58* 2.47*  --   HGB 8.1* 7.9* 8.4*  HCT 26.9* 24.6* 26.6*  MCV 104.3* 99.6  --   MCH 31.4 32.0  --   MCHC 30.1 32.1  --   RDW 13.4 13.7  --   PLT PLATELET CLUMPS NOTED ON SMEAR, COUNT APPEARS DECREASED 97*  --    Cardiac Enzymes Recent Labs  Lab 12/19/18 1441 12/19/18 2005 12/20/18 0346  TROPONINI 0.07* 0.07* 0.12*    Recent Labs  Lab 12/19/18 1039  TROPIPOC 0.08    BNP Recent Labs  Lab 12/19/18 1028  BNP 1,887.1*    DDimer No results for  input(s): DDIMER in the last 168 hours.   Radiology    No results found.  Cardiac Studies   Left Heart Cath 2000 - LAD 50%, 70%, Diag 90% LCX 70 to 80%. LVEF 70  Echocardiogram 12/20/2018   1. The left ventricle has severely reduced systolic function of 95-63%. The cavity size is mildly increased. There is mild left ventricular wall thickness. Echo evidence of impaired relaxation diastolic filling patterns. Indeterminent filling pressures.  2. Global hypokinesis.  3. Moderately dilated left atrial size.  4. Normal right atrial size.  5. The mitral valve normal in structure. Regurgitation is mild by color flow Doppler.  6. Normal tricuspid valve.  7. The aortic valve tricuspid. There is mild thickening and mild calcification of the aortic valve.  8. The ascending aorta and aortic rootare normal is size and structure.  9. No atrial level shunt detected by color flow Doppler.  Patient Profile     85 y.o. male with known CAD, previously HFpEF, paroxysmal atrial fibrillation, hypertension, diabetes, and hyperlipidemia who presented to the emergency department with progressive exertional dyspnea and volume overload. He was subsequently admitted for an acute on chronic heart failure exacerbation.  Assessment & Plan    Acute HFrEF CAD s/p CABG (no sternotomy wires on CXR) - Symptoms have improved since admission. NYHA class III-IV.   - Repeat echocardiogram with LVEF 25-30% and diffuse hypokinesis - Net negative 3.1L since admission and weight is down 3.4 kg  - BMP reviewed. Increase in creatinine, BUN, and CO2. Likely indicating intravascular euvolemia.  - Switch to PO furosemide 40 mg BID - Switch to metoprolol succinate 25 mg QD.  - Continue Bidil  - Unable to add ACE/ARB at this point due to CKD Stage IV - Would not add spironolactone at this point given hyperkalemia and CKD  - QRS is wide but patient's age, comorbidies, and NYHA class may make him ineligible  for CRT  - Will need  outpatient cardiology follow-up   Paroxysmal Atrial Fibrillation  - Currently in sinus - Rate controlled on metoprolol  - Not currently on anticoagulation. CHADsVASC 7. HASBLED 3.   HTN - Switch to PO lasix  - Continue BiDil and metoprolol   HLD - Continue atorvastatin 80 mg QD  For questions or updates, please contact [facility] Please consult [email or web address] for contact info under Cardiology/STEMI.     Signed, Ina Homes, MD  12/22/2018, 8:27 AM   ---------------------------------------------------------------------------------------------   History and all data above reviewed.  Patient examined.  I agree with the findings as above.  [name] feels well and is ready to go home, working with PT when I saw him today.   Constitutional: No acute distress Cardiovascular: regular rhythm, normal rate, no murmurs. S1 and S2 normal. Radial pulses normal bilaterally. No jugular venous distention.  Respiratory: clear to auscultation bilaterally GI : normal bowel sounds, soft and nontender. No distention.   MSK: extremities warm, well perfused. No edema.  NEURO: grossly nonfocal exam, moves all extremities. PSYCH: alert and oriented x 3, normal mood and affect.   All available labs, radiology testing, previous records reviewed. Agree with documented assessment and plan of my colleague as stated above with the following additions or changes:  Active Problems:   Hyperkalemia   DM (diabetes mellitus) (HCC)   CKD (chronic kidney disease), stage IV (HCC)   Orthostatic hypotension   Acute on chronic diastolic (congestive) heart failure (HCC)   Congestive heart failure (CHF) (HCC)   Pressure injury of skin   Acute combined systolic and diastolic heart failure ([place])    Plan: prefers medical management over invasive approach. This is reasonable given renal failure and not wanting to pursue dialysis.   [facility] will sign off.   Medication Recommendations:   - Metoprolol  succinate 25 mg daily.  - ASA 81 mg daily - atorvastatin 80 mg daily - furosemide 40 mg daily - bidil 20-37.5 mg 3 times daily  Other recommendations (labs, testing, etc):  - Follow up as an outpatient:  01/27/2019 with Kathleen Argue PA.    Length of Stay:  LOS: 3 days   Elouise Munroe, MD [facility] 2:52 PM  12/22/2018

## 2018-12-22 NOTE — Discharge Instructions (Signed)
Follow with Primary MD Binnie Rail, MD in 7 days   Get CBC, CMP,checked  by Primary MD next visit.    Activity: As tolerated with Full fall precautions use walker/cane & assistance as needed   Disposition Home    Diet: Heart Healthy , with feeding assistance and aspiration precautions.  For Heart failure patients - Check your Weight same time everyday, if you gain over 2 pounds, or you develop in leg swelling, experience more shortness of breath or chest pain, call your Primary MD immediately. Follow Cardiac Low Salt Diet and 1.5 lit/day fluid restriction.   On your next visit with your primary care physician please Get Medicines reviewed and adjusted.   Please request your Prim.MD to go over all Hospital Tests and Procedure/Radiological results at the follow up, please get all Hospital records sent to your Prim MD by signing hospital release before you go home.   If you experience worsening of your admission symptoms, develop shortness of breath, life threatening emergency, suicidal or homicidal thoughts you must seek medical attention immediately by calling 911 or calling your MD immediately  if symptoms less severe.  You Must read complete instructions/literature along with all the possible adverse reactions/side effects for all the Medicines you take and that have been prescribed to you. Take any new Medicines after you have completely understood and accpet all the possible adverse reactions/side effects.   Do not drive, operating heavy machinery, perform activities at heights, swimming or participation in water activities or provide baby sitting services if your were admitted for syncope or siezures until you have seen by Primary MD or a Neurologist and advised to do so again.  Do not drive when taking Pain medications.    Do not take more than prescribed Pain, Sleep and Anxiety Medications  Special Instructions: If you have smoked or chewed Tobacco  in the last 2 yrs please  stop smoking, stop any regular Alcohol  and or any Recreational drug use.  Wear Seat belts while driving.   Please note  You were cared for by a hospitalist during your hospital stay. If you have any questions about your discharge medications or the care you received while you were in the hospital after you are discharged, you can call the unit and asked to speak with the hospitalist on call if the hospitalist that took care of you is not available. Once you are discharged, your primary care physician will handle any further medical issues. Please note that NO REFILLS for any discharge medications will be authorized once you are discharged, as it is imperative that you return to your primary care physician (or establish a relationship with a primary care physician if you do not have one) for your aftercare needs so that they can reassess your need for medications and monitor your lab values.

## 2018-12-22 NOTE — Consult Note (Signed)
   [facility] CM Inpatient Consult   12/22/2018  Chad Mcmillan Sep 13, 1924 [id number]   Patient was assessed for [place] Management for community services. Patient was previously active with [place] [facility].  Met with patient at bedside regarding being restarted with [facility] services. An active consent form signed is on file.   Met with the patient at the bedside. Patient states he was home managing alright with his wife who he states has Alzheimer's Disease.  He states, "she's just as feisty as ever".    He states his sons are supportive.  He denies any issues with meals, transportation, or resources.  He speaks of having a nurse coming out 3 times a week for his foot wound care.  [facility] uses [facility]. Inpatient RNCM notes reviewed. Spoke with patient about restarting services.  He states, "right now I feel like if I can get some therapy at home along with my nurse taking care of my foot wound, I don't think I need anything more.  I have my diabetes doctor and Celso Amy is my family doctor so I am thinking that will be plenty."  Patient declines the need for additional follow up.  Patient did accept a brochure and 24 hour nurse advise line magnet.    Of note, [facility] Care Management services does not replace or interfere with any services that are arranged by inpatient case management or social work. For additional questions or referrals please contact:  Natividad Brood, RN BSN [place] [facility] Liaison  [phone] business mobile phone Toll free office [phone]

## 2018-12-22 NOTE — Care Management Note (Signed)
Case Management Note  Patient Details  Name: Chad Mcmillan MRN: [id number] Date of Birth: [birth year]  Subjective/Objective:         Orthostatic Hypotension          Action/Plan: Patient lives at home with spouse; Outpatient Primary MD for the patient is Binnie Rail, MD; has private insurance with Medicare; CM talked to patient at the bedside, he gave CM permission to talk to his son concerning [place] services; TCT son Louie Casa; patient is active with [facility] as prior to admission; resumption of service orders placed; Tiffany with [facility] made aware of possible dc home today; DME - walker and cane at home.  Expected Discharge Date:     Possibly 12/22/2018             Expected Discharge Plan:  [place]  Discharge planning Services  CM Consult  Choice offered to:  Patient, Adult Children  HH Arranged:  RN, PT [place] Agency:  [facility] ([facility])  Status of Service:  In process, will continue to follow  Sherrilyn Rist [phone] 12/22/2018, 10:10 AM

## 2018-12-22 NOTE — Consult Note (Signed)
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  Followed at the outpatient [facility] type: Pressure Pressure Injury POA: Yes Measurement:1cm x 1.6cm x 0.2cm Wound BWN:[id number] obscured by the presence of dried serum (scab), red Drainage (amount, consistency, odor) None Periwound:intact, mild erythema, dry Dressing procedure/placement/frequency: I will implement a POC while in house for daily cleansing followed by placement of a nonadherent antimicrobial gauze (xeroform). Following dressings, the patient has been provided a pressure redistribution heel boot (Prevalon).  Post discharge, patient to resume followup by the outpatient [facility] [place] team will not follow, but will remain available to this patient, the nursing and medical teams.  Please re-consult if needed. Thanks, Maudie Flakes, MSN, RN, [place], Arther Abbott  Pager# [phone]

## 2018-12-22 NOTE — Progress Notes (Signed)
Physical Therapy Treatment Patient Details Name: Chad Mcmillan MRN: [id number] DOB: Oct 10, 1924 Today's Date: 12/22/2018    History of Present Illness Patient is a 85 y/o make who presents with weakness, dizziness, dyspnea and fatigue. Found to have nonsustained VT. CXR-CHF exacerbation. PMH includes CAD, CVA, DM, HTN, CKD, chronic anemia, thrombocytopenia, dementia.     PT Comments    Patient progressing well towards PT goals. Continues to demonstrate balance deficits esp with dynamic standing tasks requiring Min A for support. Pt reports being aware of need for UE support due to fear of falling. Discussed having chair positioned behind sink at home in case need to sit due to unsteadiness. Demonstrate slow gait speed indicating increased fall risk. Instructed pt in sit to stand exercises to perform at home for strengthening. Will follow.    Follow Up Recommendations  Home health PT;Supervision for mobility/OOB;Supervision/Assistance - 24 hour     Equipment Recommendations  None recommended by PT    Recommendations for Other Services       Precautions / Restrictions Precautions Precautions: Fall Precaution Comments: Reports hx of falls Restrictions Weight Bearing Restrictions: No    Mobility  Bed Mobility               General bed mobility comments: up in chair upon PT arrival.   Transfers Overall transfer level: Needs assistance Equipment used: Rolling walker (2 wheeled) Transfers: Sit to/from Stand Sit to Stand: Min guard         General transfer comment: Min guard for safety. Multiple attempts to come to standing and use of momentum. Stood from chair x6. CUes for hand placement.   Ambulation/Gait Ambulation/Gait assistance: Min guard Gait Distance (Feet): 150 Feet Assistive device: Rolling walker (2 wheeled) Gait Pattern/deviations: Step-through pattern;Decreased stride length;Trunk flexed;Shuffle Gait velocity: .97 ft/sec Gait velocity interpretation: <1.8  ft/sec, indicate of risk for recurrent falls General Gait Details: Slow, mildly unsteady gait with forward flexed posture; 2/4 DOE Sp02 remained in mid 90s on RA and HR ranged from 50s-100s bpm.   Stairs             Wheelchair Mobility    Modified Rankin (Stroke Patients Only)       Balance   Sitting-balance support: Feet supported;No upper extremity supported Sitting balance-Leahy Scale: Fair Sitting balance - Comments: Able to reach down and adjust socks without difficulty.    Standing balance support: During functional activity Standing balance-Leahy Scale: Poor Standing balance comment: Able to stand at sink and wash hands, requires MIn A for balance due to unsteadiness.                             Cognition Arousal/Alertness: Awake/alert Behavior During Therapy: WFL for tasks assessed/performed Overall Cognitive Status: No family/caregiver present to determine baseline cognitive functioning                                 General Comments: Question safety awareness, even though pt reports not wanting to let go of support due to being fearful, continues to do so esp with tasks.      Exercises Other Exercises Other Exercises: Sit to stand x5 from chair with emphasize on controlled descent. Cues forhand placement.    General Comments General comments (skin integrity, edema, etc.): BP pre activity 129/87, post activity 144/45      Pertinent Vitals/Pain Pain Assessment: No/denies pain  Home Living                      Prior Function            PT Goals (current goals can now be found in the care plan section) Progress towards PT goals: Progressing toward goals    Frequency    Min 3X/week      PT Plan Current plan remains appropriate    Co-evaluation              AM-PAC PT "6 Clicks" Mobility   Outcome Measure  Help needed turning from your back to your side while in a flat bed without using bedrails?:  A Little Help needed moving from lying on your back to sitting on the side of a flat bed without using bedrails?: A Little Help needed moving to and from a bed to a chair (including a wheelchair)?: A Little Help needed standing up from a chair using your arms (e.g., wheelchair or bedside chair)?: A Little Help needed to walk in hospital room?: A Little Help needed climbing 3-5 steps with a railing? : A Lot 6 Click Score: 17    End of Session Equipment Utilized During Treatment: Gait belt Activity Tolerance: Patient tolerated treatment well Patient left: in chair;with call bell/phone within reach(no chair alarm pads left, nurse aware (per student nurse)) Nurse Communication: Mobility status PT Visit Diagnosis: Unsteadiness on feet (R26.81);Difficulty in walking, not elsewhere classified (R26.2)     Time: 4627-0350 PT Time Calculation (min) (ACUTE ONLY): 15 min  Charges:  $Gait Training: 8-22 mins                     Wray Kearns, PT, DPT Acute Rehabilitation Services Pager [phone] Office [place] 12/22/2018, 12:01 PM

## 2018-12-22 NOTE — Discharge Summary (Signed)
Chad Mcmillan, is a 85 y.o. male  DOB 10/13/1924  MRN [id number].  Admission date:  12/19/2018  Admitting Physician  Merton Border, MD  Discharge Date:  12/22/2018   Primary MD  Binnie Rail, MD  Recommendations for primary care physician for things to follow:  -Check CBC, BMP during next visit -Patient to follow with cardiology as an outpatient regarding further work-up for acute CHF  Admission Diagnosis  weakness   Discharge Diagnosis  weakness    Active Problems:   Hyperkalemia   DM (diabetes mellitus) ([place])   CKD (chronic kidney disease), stage IV (HCC)   Orthostatic hypotension   Acute on chronic diastolic (congestive) heart failure (HCC)   Congestive heart failure (CHF) (HCC)   Pressure injury of skin   Acute combined systolic and diastolic heart failure ([place])      Past Medical History:  Diagnosis Date  . ADENOCARCINOMA, PROSTATE 06/01/2008  . CORONARY ARTERY DISEASE 06/13/2007  . CVA 06/01/2008   x 2  . DIABETES MELLITUS, TYPE I, WITH OPHTHALMIC COMPLICATIONS 5/85/2778  . 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 HYPERKALEMIA 06/01/2008   due to DM neparopathy  . HYPERLIPIDEMIA 06/13/2007  . HYPERTENSION 06/13/2007  . NEOPLASM, MALIGNANT, SKIN, FACE 06/01/2008  . Orthostatic hypotension 12/19/2018  . Other chronic nonalcoholic liver disease 2/42/3536  . RENAL DISEASE 06/01/2008    Past Surgical History:  Procedure Laterality Date  . Carotid Duplex  09/12/2004  . CATARACT EXTRACTION    . CHOLECYSTECTOMY    . ESOPHAGOGASTRODUODENOSCOPY  06/28/1991  . FEMUR IM NAIL Right 07/17/2018  . HERNIA REPAIR     right Inguinal hernia  . INTRAMEDULLARY (IM) NAIL INTERTROCHANTERIC N/A 07/17/2018   Procedure: INTRAMEDULLARY (IM) NAIL INTERTROCHANTRIC;  Surgeon: Leandrew Koyanagi, MD;  Location: [place];  Service: Orthopedics;  Laterality: N/A;  . Rest/Stress Cardiolite  01/22/2001       History of present illness and   Hospital Course:     Kindly see H&P for history of present illness and admission details, please review complete Labs, Consult reports and Test reports for all details in brief  HPI  from the history and physical done on the day of admission  Chad Mcmillan  is a 85 y.o. male, with past medical history significant for CAD, CVA, diabetes mellitus type 2 , hypertension, HLD and diastolic congestive heart failure status post echo on 07/2018 with ejection fraction of 50 to 55% presenting with worsening weakness and shortness of breath for the last few weeks.  Today the shortness of breath has increased significantly.  Patient had his Lasix stopped on 22 January by cardiology due to renal insufficiency.  Patient denies any chest pains nausea or vomiting.  Patient was seen in the emergency room and his EKG showed normal sinus rhythm with PVCs.  Cardiology saw and evaluated the patient and advised beta-blockers and Lasix IV for now.  In the emergency room patient also was noted to be hyperkalemic and she received Kayexalate.    Hospital Course  Acute on chronic diastolic /systolic CHF -Most recent echo September 2019, with a preserved EF 50 to 79%, and diastolic dysfunction -Echo during hospital stay showing severe drop in his EF to 25%, with global hypokinesis -Volume status improving with IV diuresis, appears to be euvolemic at time of discharge, he will be discharged on 40 mg of oral Lasix, with recommendation for fluid restriction and low-salt diet. -He has significant drop in his EF to 25%, cardiology has discussed extensively with the patient, did as well, at this point he does prefer take conservative management especially with his known advanced CKD, possibility for worsening renal function, he is on aspirin, started on beta-blockers, BiDil, certainly is a candidate for ACE/ARB/Entresto in the setting of CKD and soft blood pressure.   Diabetes mellitus type 2 -Resume home  meds  Hyperkalemia -Required Kayexalate, as well improved with IV Lasix, monitor closely  Arrhythmias -Thought to have A. fib on admission, has been seen by cardiology, appears to be ventricular arrhythmias, nonsustained VT and multiple VCs, management per cardiology, he is on beta-blockers. -Patient  with known history of paroxysmal A. fib in the past, has been recommended against anticoagulation especially in the setting of his recurrent anemia and chronic thrombocytopenia, specially with no recurrence of A. fib, this can be discussed further with his primary cardiologist as an outpatient was very familiar with the patient.  CKD stage IV -stable, continue to monitor on diuresis  History of nonhemorrhagic CVA -Continue with home aspirin and statin  Hyperlipidemia -Continue with statin.  Pressure ulcer, present on admission - Pressure Injury 12/19/18 Stage II - Partial thickness loss of dermis presenting as a shallow open ulcer with a red, pink wound bed without slough. Two stage two ulcers; one on coccyx, one on left lower buttocks - Wound / Incision (Open or Dehisced) 12/19/18 Diabetic ulcer Heel Right;Posterior  Anemia of chronic kidney disease -Sent with known chronic anemia extensive work-up in the past, his anemia was felt secondary to his chronic kidney disease, actually anemia and thrombocytopenia were the main reason he was not started on anticoagulation per cardiology for his paroxysmal A. fib in the past. -Given his positive troponins, was transfused 1 unit PRBC hemoglobin 8.4 on discharge   Discharge Condition:  Stable   Follow UP  Follow-up Information    [facility].   Specialty:  Home Health Services Why:  A home health care nurse will continue to go to your home Contact information: [address] [place] [address] [phone]        Binnie Rail, MD Follow up in 1 week(s).   Specialty:  Internal Medicine Contact  information: [place] [address] [phone]        [name], MD .   Specialty:  Cardiology Contact information: [address] [place] [address] [place] [address] [phone]             Discharge Instructions  and  Discharge Medications     Discharge Instructions    Diet - low sodium heart healthy   Complete by:  As directed    Increase activity slowly   Complete by:  As directed      Allergies as of 12/22/2018   No Known Allergies     Medication List    STOP taking these medications   chlorthalidone 25 MG tablet Commonly known as:  HYGROTON     TAKE these medications   aspirin EC  81 MG tablet Take 1 tablet (81 mg total) by mouth daily.   atorvastatin 40 MG tablet Commonly known as:  LIPITOR Take 2 tablets (80 mg total) by mouth daily at 6 PM.   calcium-vitamin D 500-200 MG-UNIT tablet Commonly known as:  OSCAL 500/200 D-3 Take 1 tab TID x 6 months   cyanocobalamin 1000 MCG tablet Take 1 tablet (1,000 mcg total) by mouth daily.   donepezil 5 MG tablet Commonly known as:  ARICEPT Take 1 tablet (5 mg total) by mouth at bedtime.   Ferrous Gluconate 324 (37.5 Fe) MG Tabs Take 324 mg by mouth daily.   furosemide 40 MG tablet Commonly known as:  LASIX Take 1 tablet (40 mg total) by mouth daily.   HYDROcodone-acetaminophen 7.5-325 MG tablet Commonly known as:  NORCO Take 1-2 tablets by mouth every 6 (six) hours as needed for moderate pain.   isosorbide-hydrALAZINE 20-37.5 MG tablet Commonly known as:  BIDIL Take 1 tablet by mouth 3 (three) times daily.   LORazepam 0.5 MG tablet Commonly known as:  ATIVAN Take 1 tablet (0.5 mg total) by mouth at bedtime as needed for anxiety.   meclizine 12.5 MG tablet Commonly known as:  ANTIVERT TAKE 1 TABLET BY MOUTH THREE TIMES DAILY AS NEEDED FOR DIZZINESS   metoprolol tartrate 25 MG tablet Commonly known as:  LOPRESSOR Take 1 tablet (25 mg total) by mouth 2 (two) times  daily.   nitroGLYCERIN 0.4 MG SL tablet Commonly known as:  NITROSTAT Place 1 tablet (0.4 mg total) under the tongue every 5 (five) minutes as needed. For chest pain   polyethylene glycol packet Commonly known as:  MIRALAX / GLYCOLAX Take 17 g by mouth daily.   repaglinide 0.5 MG tablet Commonly known as:  PRANDIN Take 1 tablet (0.5 mg total) by mouth 3 (three) times daily before meals.   senna-docusate 8.6-50 MG tablet Commonly known as:  Senokot-S Take 1 tablet by mouth at bedtime as needed for mild constipation or moderate constipation.         Diet and Activity recommendation: See Discharge Instructions above   Consults obtained -  cardiolgoy   Major procedures and Radiology Reports - PLEASE review detailed and final reports for all details, in brief -    2 d echo   1. The left ventricle has severely reduced systolic function of 54-27%. The cavity size is mildly increased. There is mild left ventricular wall thickness. Echo evidence of impaired relaxation diastolic filling patterns. Indeterminent filling pressures.  2. Global hypokinesis.  3. Moderately dilated left atrial size.  4. Normal right atrial size.  5. The mitral valve normal in structure. Regurgitation is mild by color flow Doppler.  6. Normal tricuspid valve.  7. The aortic valve tricuspid. There is mild thickening and mild calcification of the aortic valve.  8. The ascending aorta and aortic rootare normal is size and structure.  9. No atrial level shunt detected by color flow Doppler.  Dg Chest Portable 1 View  Result Date: 12/19/2018 CLINICAL DATA:  Chest pain, shortness of breath. EXAM: PORTABLE CHEST 1 VIEW COMPARISON:  Radiographs of July 18, 2018. FINDINGS: Stable cardiomegaly. Atherosclerosis of thoracic aorta is noted. No pneumothorax is noted. Mild left pleural effusion is noted with associated edema or infiltrate. Right basilar infiltrate or edema is noted with small right pleural effusion.  Subacute to chronic distal left clavicular fracture is noted. IMPRESSION: Bibasilar edema or infiltrates are noted with associated pleural effusions, left greater than right. Subacute  to chronic distal left clavicular fracture. Aortic Atherosclerosis (ICD10-I70.0). Electronically Signed   By: Marijo Conception, M.D.   On: 12/19/2018 10:41    Micro Results    No results found for this or any previous visit (from the past 240 hour(s)).     Today   Subjective:   [name] today has no headache,no chest or  abdominal pain,no new weakness tingling or numbness, feels much better wants to go home today.   Objective:   Blood pressure (!) 116/55, pulse 63, temperature 98.4 F (36.9 C), temperature source Oral, resp. rate 20, height 6' (1.829 m), weight 68.2 kg, SpO2 97 %.   Intake/Output Summary (Last 24 hours) at 12/22/2018 1445 Last data filed at 12/22/2018 1229 Gross per 24 hour  Intake 1118 ml  Output 1760 ml  Net -642 ml    Exam Awake Alert, Oriented x 3, No new F.N deficits, Normal affect  Symmetrical Chest wall movement, Good air movement bilaterally, CTAB RRR,No Gallops,Rubs or new Murmurs, No Parasternal Heave +ve B.Sounds, Abd Soft, Non tender,No rebound -guarding or rigidity. No Cyanosis, Clubbing or edema, No new Rash or bruise  Data Review   CBC w Diff:  Lab Results  Component Value Date   WBC 4.7 12/21/2018   HGB 8.4 (L) 12/22/2018   HGB 9.1 (L) 09/28/2018   HCT 26.6 (L) 12/22/2018   HCT 28.0 (L) 09/28/2018   PLT 97 (L) 12/21/2018   PLT 127 (L) 09/28/2018   LYMPHOPCT 22 12/19/2018   MONOPCT 9 12/19/2018   EOSPCT 1 12/19/2018   BASOPCT 0 12/19/2018    CMP:  Lab Results  Component Value Date   NA 138 12/22/2018   NA 145 (H) 12/02/2018   K 4.6 12/22/2018   CL 105 12/22/2018   CO2 23 12/22/2018   BUN 66 (H) 12/22/2018   BUN 71 (H) 12/02/2018   CREATININE 2.92 (H) 12/22/2018   PROT 5.4 (L) 12/19/2018   ALBUMIN 2.6 (L) 12/19/2018   BILITOT 0.4 12/19/2018    ALKPHOS 135 (H) 12/19/2018   AST 82 (H) 12/19/2018   ALT 57 (H) 12/19/2018  .   Total Time in preparing paper work, data evaluation and todays exam - 51 minutes  Phillips Climes M.D on 12/22/2018 at 2:45 PM  [facility]   Office  [phone]

## 2018-12-23 ENCOUNTER — Telehealth: Payer: Self-pay | Admitting: *Deleted

## 2018-12-23 NOTE — Telephone Encounter (Signed)
Called pt to confirm hosp f/u appt that was made for 12/29/18. Pt states he was not aware of appt. Gave pt day and time. He states he will make sure his son know so he can bring him, and if he need to reschedule he will have him to call and reschedule. Inform pt had some additional question concerning discharge. Completed TCM call below  Transition Care Management Follow-up Telephone Call   Date discharged? 12/29/18   How have you been since you were released from the hospital? Pt states he is feeling fairly ok   Do you understand why you were in the hospital? YES   Do you understand the discharge instructions? YES   Where were you discharged to? Home   Items Reviewed:  Medications reviewed: YES  Allergies reviewed: YES  Dietary changes reviewed: YES, pt states he try to eat because he is a diabetic but doesn't have much of a appetite  Referrals reviewed: YES, [facility]   Functional Questionnaire:   Activities of Daily Living (ADLs):   He states he are independent in the following: bathing and hygiene, feeding, continence, grooming, toileting and dressing States they require assistance with the following: ambulation   Any transportation issues/concerns?: NO, he states his son will bring him   Any patient concerns? NO   Confirmed importance and date/time of follow-up visits scheduled YES, 12/29/18  Provider Appointment booked with [name]  Confirmed with patient if condition begins to worsen call PCP or go to the ER.  Patient was given the office number and encouraged to call back with question or concerns.  : YES

## 2018-12-24 ENCOUNTER — Telehealth: Payer: Self-pay | Admitting: Internal Medicine

## 2018-12-24 NOTE — Telephone Encounter (Signed)
Gave ok for verbal orders.  

## 2018-12-24 NOTE — Telephone Encounter (Signed)
Copied from [place] [id number]. Topic: Quick Communication - Home Health Verbal Orders >> Dec 24, 2018  8:21 AM Lennox Solders wrote: Caller/Agency: Lanae Boast  or Kristine Garbe Number: [phone] Requesting Skilled Nursing for wound care to continue through end of march or until wound has heal Frequency: 2x8 . The order will be refax. Please call with verbal in meantime

## 2018-12-28 ENCOUNTER — Telehealth: Payer: Self-pay | Admitting: Internal Medicine

## 2018-12-28 DIAGNOSIS — M1991 Primary osteoarthritis, unspecified site: Secondary | ICD-10-CM

## 2018-12-28 DIAGNOSIS — F0281 Dementia in other diseases classified elsewhere with behavioral disturbance: Secondary | ICD-10-CM

## 2018-12-28 DIAGNOSIS — I2510 Atherosclerotic heart disease of native coronary artery without angina pectoris: Secondary | ICD-10-CM

## 2018-12-28 DIAGNOSIS — Z8673 Personal history of transient ischemic attack (TIA), and cerebral infarction without residual deficits: Secondary | ICD-10-CM

## 2018-12-28 DIAGNOSIS — I5033 Acute on chronic diastolic (congestive) heart failure: Secondary | ICD-10-CM

## 2018-12-28 DIAGNOSIS — L97412 Non-pressure chronic ulcer of right heel and midfoot with fat layer exposed: Secondary | ICD-10-CM | POA: Diagnosis not present

## 2018-12-28 DIAGNOSIS — S72111D Displaced fracture of greater trochanter of right femur, subsequent encounter for closed fracture with routine healing: Secondary | ICD-10-CM

## 2018-12-28 DIAGNOSIS — I480 Paroxysmal atrial fibrillation: Secondary | ICD-10-CM

## 2018-12-28 DIAGNOSIS — Z951 Presence of aortocoronary bypass graft: Secondary | ICD-10-CM

## 2018-12-28 DIAGNOSIS — Z7984 Long term (current) use of oral hypoglycemic drugs: Secondary | ICD-10-CM

## 2018-12-28 DIAGNOSIS — K4090 Unilateral inguinal hernia, without obstruction or gangrene, not specified as recurrent: Secondary | ICD-10-CM

## 2018-12-28 DIAGNOSIS — D631 Anemia in chronic kidney disease: Secondary | ICD-10-CM

## 2018-12-28 DIAGNOSIS — E1142 Type 2 diabetes mellitus with diabetic polyneuropathy: Secondary | ICD-10-CM | POA: Diagnosis not present

## 2018-12-28 DIAGNOSIS — N184 Chronic kidney disease, stage 4 (severe): Secondary | ICD-10-CM

## 2018-12-28 DIAGNOSIS — I10 Essential (primary) hypertension: Secondary | ICD-10-CM | POA: Diagnosis not present

## 2018-12-28 DIAGNOSIS — Z9181 History of falling: Secondary | ICD-10-CM

## 2018-12-28 DIAGNOSIS — E1122 Type 2 diabetes mellitus with diabetic chronic kidney disease: Secondary | ICD-10-CM | POA: Diagnosis not present

## 2018-12-28 DIAGNOSIS — G309 Alzheimer's disease, unspecified: Secondary | ICD-10-CM

## 2018-12-28 DIAGNOSIS — F0390 Unspecified dementia without behavioral disturbance: Secondary | ICD-10-CM | POA: Diagnosis not present

## 2018-12-28 DIAGNOSIS — E1140 Type 2 diabetes mellitus with diabetic neuropathy, unspecified: Secondary | ICD-10-CM | POA: Diagnosis not present

## 2018-12-28 DIAGNOSIS — E11621 Type 2 diabetes mellitus with foot ulcer: Secondary | ICD-10-CM | POA: Diagnosis present

## 2018-12-28 DIAGNOSIS — I130 Hypertensive heart and chronic kidney disease with heart failure and stage 1 through stage 4 chronic kidney disease, or unspecified chronic kidney disease: Secondary | ICD-10-CM | POA: Diagnosis not present

## 2018-12-28 DIAGNOSIS — Z923 Personal history of irradiation: Secondary | ICD-10-CM | POA: Diagnosis not present

## 2018-12-28 DIAGNOSIS — L89612 Pressure ulcer of right heel, stage 2: Secondary | ICD-10-CM

## 2018-12-28 NOTE — Telephone Encounter (Signed)
Copied from [place] [id number]. Topic: Quick Communication - Home Health Verbal Orders >> Dec 28, 2018  1:18 PM Rutherford Nail, [place] wrote: Caller/Agency: Pam with [facility] Number: [phone] ask to speak Maggie or Lenna Sciara Requesting OT/PT/Skilled Nursing/Social Work: Skilled Nursing Frequency:  2x a week for 1 week 3x a week for 8 weeks 2 PRN

## 2018-12-28 NOTE — Progress Notes (Signed)
Subjective:    Patient ID: Chad Mcmillan, male    DOB: 04-16-1924, 85 y.o.   MRN: [id number]  HPI The patient is here for follow up from the hospital.  He is here with his son.   Admitted 2/1 - 2/4 for weakness  Recommendations for primary care physician for things to follow:  -Check CBC, BMP during next visit -Patient to follow with cardiology as an outpatient regarding further work-up for acute CHF   He was taken to the ED for worsening weakness and shortness of breath for the past few weeks.  His SOB was much worse the day of admission. His lasix was stopped 1/22 due to renal insufficiency.  He denied chest pain, nausea, vomiting.  EKG in ED showed NSR with PVC. Cardiology was consulted - they advised beta-blockers and Lasix.  He was hyperkalemic in the ED and received kayexelate.     Acute on chronic diastolic/systolic CHF: Echo 0/0867 - EF 61-95% and diastolic dysfunction Echo during hospital stay showed EF 25% with global hypokinesis Volume status improved with IV diuresis, euvolemic on discharge Discharged on Lasix 40 mg daily Cardiology discussed decreased EF with patient and family - conservative measures recommended Beta blocker started  Diabetes: Continued on home regimen  Hyperkalemia: Received kayexalate Improved with IV lasix Bmp today  Arrhythmias: Initially thought to have Afib - cardiology evaluated and acutally ventricular arrhythmia, nonsustained VT and multiple VCs On beta blockers H/o Afib - was on xarelto, but stopped and not recommended due to recurrent anemia and chronic thrombocytopenia  CKD, stage 4 with anemia Stable Referred to nephrology already Transfused 1 unit PRBCs due to positive troponin  Pressure ulcer on admisttion On 2/1 it was stage 2 Partial thickness loss of dermis presenting as a shallow open ulcer with red, pink wound bed w/o slough Two stage two ulcers - one on coccyx, on on left lower buttocks Diabetic ulcer heel on  right Has home nursing  Has OT/PT, skilled nursing and social work was apparently ordered, but now it is come to his house and?  Ordered or not   Acute on chronic diastolic and systolic heart failure: He is taking his medication daily.  Since leaving the hospital he still feels SOB.  He is unsure if it is worse.  He has not weighed himself - it is difficult for him to get on the scale.  He is unbalanced and needs to hold onto the walker.  He has not been checking his blood pressure, but when the nurse, she checks it and as far as he knows it has been normal.  He denies chest pain, palpitations and denies any leg edema.  His son has not checked his legs today and is unsure if there is swelling.  His weight here is the same as prior to the hospital when he was fluid overloaded.  Pressure ulcer, heel ulcer: He does have a nurse coming to his house and she has been changing his bandages and monitoring the ulcers.  Chronic kidney disease, hyperkalemia: Kidney disease was stable in the hospital.  I did refer him to nephrology prior to him going into the hospital.  He and his family has not heard anything from them.  He feels he is urinating fine.  He is taking his medication daily as prescribed.  Anemia: He is not on Xarelto secondary to his anemia.  He was transfused 1 unit while in the hospital.  Diabetes: He is compliant with a diabetic diet.  He is taking Prandin before each meal.   Medications and allergies reviewed with patient and updated if appropriate.  Patient Active Problem List   Diagnosis Date Noted  . Acute combined systolic and diastolic heart failure ([place])   . Orthostatic hypotension 12/19/2018  . Acute on chronic diastolic (congestive) heart failure ([place]) 12/19/2018  . Congestive heart failure (CHF) ([place]) 12/19/2018  . Pressure injury of skin 12/19/2018  . Vitamin B12 deficiency 12/13/2018  . Vitamin D deficiency 12/13/2018  . Paroxysmal atrial fibrillation with RVR ([place])   .  Chronic diastolic heart failure ([place])   . S/P CABG (coronary artery bypass graft)   . Closed fracture of acromial end of left clavicle 07/19/2018    Class: Acute  . Closed displaced fracture of greater trochanter of right femur ([place]) 07/17/2018  . History of thrombocytopenia 07/17/2018  . Hypocalcemia 02/12/2018  . Agitation   . Dementia ([place])   . CKD (chronic kidney disease), stage IV ([place]) 01/14/2018  . Inguinal hernia of right side without obstruction or gangrene 01/12/2018  . Vertigo 01/07/2018  . LBBB (left bundle branch block) 01/07/2018  . Generalized weakness 01/07/2018  . Foot pain, bilateral 10/07/2017  . Dizzy 01/31/2016  . Pancytopenia ([place]) 07/27/2012  . DM (diabetes mellitus) ([place]) 07/27/2012  . Anemia 07/27/2012  . NASH (nonalcoholic steatohepatitis) 07/17/2010  . ADENOCARCINOMA, PROSTATE 06/01/2008  . Hyperkalemia 06/01/2008  . History of stroke 06/01/2008  . NEOPLASM, MALIGNANT, SKIN, FACE 06/01/2008  . Dyslipidemia 06/13/2007  . Essential hypertension 06/13/2007  . CAD (coronary artery disease) 06/13/2007    Current Outpatient Medications on File Prior to Visit  Medication Sig Dispense Refill  . aspirin EC 81 MG tablet Take 1 tablet (81 mg total) by mouth daily. 90 tablet 3  . atorvastatin (LIPITOR) 40 MG tablet Take 2 tablets (80 mg total) by mouth daily at 6 PM. 60 tablet 4  . donepezil (ARICEPT) 5 MG tablet Take 1 tablet (5 mg total) by mouth at bedtime. 30 tablet 11  . Ferrous Gluconate 324 (37.5 Fe) MG TABS Take 324 mg by mouth daily.     . furosemide (LASIX) 40 MG tablet Take 1 tablet (40 mg total) by mouth daily. 30 tablet 0  . isosorbide-hydrALAZINE (BIDIL) 20-37.5 MG tablet Take 1 tablet by mouth 3 (three) times daily. 90 tablet 0  . LORazepam (ATIVAN) 0.5 MG tablet Take 1 tablet (0.5 mg total) by mouth at bedtime as needed for anxiety. 30 tablet 0  . meclizine (ANTIVERT) 12.5 MG tablet TAKE 1 TABLET BY MOUTH THREE TIMES DAILY AS NEEDED FOR DIZZINESS  30 tablet 3  . metoprolol tartrate (LOPRESSOR) 25 MG tablet Take 1 tablet (25 mg total) by mouth 2 (two) times daily. 60 tablet 0  . nitroGLYCERIN (NITROSTAT) 0.4 MG SL tablet Place 1 tablet (0.4 mg total) under the tongue every 5 (five) minutes as needed. For chest pain 25 tablet 3  . polyethylene glycol (MIRALAX / GLYCOLAX) packet Take 17 g by mouth daily.    . repaglinide (PRANDIN) 0.5 MG tablet Take 1 tablet (0.5 mg total) by mouth 3 (three) times daily before meals. 270 tablet 3  . senna-docusate (SENOKOT-S) 8.6-50 MG tablet Take 1 tablet by mouth at bedtime as needed for mild constipation or moderate constipation.     No current facility-administered medications on file prior to visit.     Past Medical History:  Diagnosis Date  . ADENOCARCINOMA, PROSTATE 06/01/2008  . CORONARY ARTERY DISEASE 06/13/2007  . CVA 06/01/2008  x 2  . DIABETES MELLITUS, TYPE I, WITH OPHTHALMIC COMPLICATIONS 0/99/8338  . DM retinopathy ([place])   . HYPERKALEMIA 06/01/2008   due to DM neparopathy  . HYPERLIPIDEMIA 06/13/2007  . HYPERTENSION 06/13/2007  . NEOPLASM, MALIGNANT, SKIN, FACE 06/01/2008  . Orthostatic hypotension 12/19/2018  . Other chronic nonalcoholic liver disease 2/50/5397  . RENAL DISEASE 06/01/2008    Past Surgical History:  Procedure Laterality Date  . Carotid Duplex  09/12/2004  . CATARACT EXTRACTION    . CHOLECYSTECTOMY    . ESOPHAGOGASTRODUODENOSCOPY  06/28/1991  . FEMUR IM NAIL Right 07/17/2018  . HERNIA REPAIR     right Inguinal hernia  . INTRAMEDULLARY (IM) NAIL INTERTROCHANTERIC N/A 07/17/2018   Procedure: INTRAMEDULLARY (IM) NAIL INTERTROCHANTRIC;  Surgeon: Leandrew Koyanagi, MD;  Location: [facility];  Service: Orthopedics;  Laterality: N/A;  . Rest/Stress Cardiolite  01/22/2001    Social History   Socioeconomic History  . Marital status: Married    Spouse name: Not on file  . Number of children: Not on file  . Years of education: Not on file  . Highest education level: Not on file    Occupational History  . Occupation: Retired    Fish farm manager: RETIRED  Social Needs  . Financial resource strain: Not on file  . Food insecurity:    Worry: Not on file    Inability: Not on file  . Transportation needs:    Medical: Not on file    Non-medical: Not on file  Tobacco Use  . Smoking status: Former Smoker    Last attempt to quit: 11/18/1978    Years since quitting: 40.1  . Smokeless tobacco: Never Used  Substance and Sexual Activity  . Alcohol use: No  . Drug use: No  . Sexual activity: Not Currently  Lifestyle  . Physical activity:    Days per week: Not on file    Minutes per session: Not on file  . Stress: Not on file  Relationships  . Social connections:    Talks on phone: Not on file    Gets together: Not on file    Attends religious service: Not on file    Active member of club or organization: Not on file    Attends meetings of clubs or organizations: Not on file    Relationship status: Not on file  Other Topics Concern  . Not on file  Social History Narrative  . Not on file    Family History  Problem Relation Age of Onset  . Vision loss Son     Review of Systems  Constitutional: Negative for appetite change, chills and fever.  Respiratory: Positive for shortness of breath (with exertion) and wheezing. Negative for cough.   Cardiovascular: Negative for chest pain, palpitations and leg swelling.  Gastrointestinal: Negative for abdominal pain and nausea.  Neurological: Negative for light-headedness and headaches.       Objective:   Vitals:   12/29/18 1302  BP: (!) 160/58  Pulse: 61  Resp: 18  Temp: 97.7 F (36.5 C)  SpO2: 99%   BP Readings from Last 3 Encounters:  12/29/18 (!) 160/58  12/22/18 (!) 127/53  12/14/18 138/68   Wt Readings from Last 3 Encounters:  12/29/18 160 lb 12.8 oz (72.9 kg)  12/22/18 150 lb 6.4 oz (68.2 kg)  12/14/18 160 lb 6.4 oz (72.8 kg)   Body mass index is 21.81 kg/m.   Physical Exam    Constitutional:  Chronically ill-appearing. No distress.  HENT:  Head: Normocephalic  and atraumatic.  Neck: Neck supple. No tracheal deviation present. No thyromegaly present.  No cervical lymphadenopathy Cardiovascular: Normal rate, regular rhythm and normal heart sounds.   No murmur heard. No carotid bruit .  Mild bilateral ankle edema but is non-pitting-edema is much better than when he was here prior to his hospitalization Pulmonary/Chest: Effort normal and breath sounds normal. No respiratory distress. No has no wheezes. No rales.  Abdomen: soft, NT, ND Skin: Skin is warm and dry. Not diaphoretic.  Psychiatric: Normal mood and affect. Behavior is normal.      Assessment & Plan:    See Problem List for Assessment and Plan of chronic medical problems.

## 2018-12-28 NOTE — Telephone Encounter (Signed)
Gave ok for verbal order.

## 2018-12-29 ENCOUNTER — Encounter: Payer: Self-pay | Admitting: Internal Medicine

## 2018-12-29 ENCOUNTER — Ambulatory Visit (INDEPENDENT_AMBULATORY_CARE_PROVIDER_SITE_OTHER): Payer: Medicare Other | Admitting: Internal Medicine

## 2018-12-29 ENCOUNTER — Inpatient Hospital Stay: Payer: Medicare Other | Admitting: Internal Medicine

## 2018-12-29 ENCOUNTER — Other Ambulatory Visit (INDEPENDENT_AMBULATORY_CARE_PROVIDER_SITE_OTHER): Payer: Medicare Other

## 2018-12-29 VITALS — BP 160/58 | HR 61 | Temp 97.7°F | Resp 18 | Ht 72.0 in | Wt 160.8 lb

## 2018-12-29 DIAGNOSIS — D649 Anemia, unspecified: Secondary | ICD-10-CM | POA: Diagnosis not present

## 2018-12-29 DIAGNOSIS — I5041 Acute combined systolic (congestive) and diastolic (congestive) heart failure: Secondary | ICD-10-CM | POA: Diagnosis not present

## 2018-12-29 DIAGNOSIS — I10 Essential (primary) hypertension: Secondary | ICD-10-CM

## 2018-12-29 DIAGNOSIS — F0390 Unspecified dementia without behavioral disturbance: Secondary | ICD-10-CM | POA: Diagnosis not present

## 2018-12-29 DIAGNOSIS — E0800 Diabetes mellitus due to underlying condition with hyperosmolarity without nonketotic hyperglycemic-hyperosmolar coma (NKHHC): Secondary | ICD-10-CM

## 2018-12-29 DIAGNOSIS — N184 Chronic kidney disease, stage 4 (severe): Secondary | ICD-10-CM

## 2018-12-29 DIAGNOSIS — Z794 Long term (current) use of insulin: Secondary | ICD-10-CM

## 2018-12-29 DIAGNOSIS — L89152 Pressure ulcer of sacral region, stage 2: Secondary | ICD-10-CM

## 2018-12-29 DIAGNOSIS — R5381 Other malaise: Secondary | ICD-10-CM | POA: Insufficient documentation

## 2018-12-29 LAB — CBC WITH DIFFERENTIAL/PLATELET
BASOS PCT: 0.3 % (ref 0.0–3.0)
Basophils Absolute: 0 10*3/uL (ref 0.0–0.1)
Eosinophils Absolute: 0.1 10*3/uL (ref 0.0–0.7)
Eosinophils Relative: 1.5 % (ref 0.0–5.0)
HCT: 29 % — ABNORMAL LOW (ref 39.0–52.0)
Hemoglobin: 9.7 g/dL — ABNORMAL LOW (ref 13.0–17.0)
Lymphocytes Relative: 19.2 % (ref 12.0–46.0)
Lymphs Abs: 1 10*3/uL (ref 0.7–4.0)
MCHC: 33.5 g/dL (ref 30.0–36.0)
MCV: 95.9 fl (ref 78.0–100.0)
Monocytes Absolute: 0.5 10*3/uL (ref 0.1–1.0)
Monocytes Relative: 8.5 % (ref 3.0–12.0)
NEUTROS ABS: 3.8 10*3/uL (ref 1.4–7.7)
NEUTROS PCT: 70.5 % (ref 43.0–77.0)
PLATELETS: 129 10*3/uL — AB (ref 150.0–400.0)
RBC: 3.03 Mil/uL — ABNORMAL LOW (ref 4.22–5.81)
RDW: 15.9 % — ABNORMAL HIGH (ref 11.5–15.5)
WBC: 5.3 10*3/uL (ref 4.0–10.5)

## 2018-12-29 LAB — COMPREHENSIVE METABOLIC PANEL
ALT: 46 U/L (ref 0–53)
AST: 47 U/L — ABNORMAL HIGH (ref 0–37)
Albumin: 3.1 g/dL — ABNORMAL LOW (ref 3.5–5.2)
Alkaline Phosphatase: 159 U/L — ABNORMAL HIGH (ref 39–117)
BUN: 78 mg/dL — ABNORMAL HIGH (ref 6–23)
CHLORIDE: 105 meq/L (ref 96–112)
CO2: 25 mEq/L (ref 19–32)
Calcium: 8.2 mg/dL — ABNORMAL LOW (ref 8.4–10.5)
Creatinine, Ser: 2.61 mg/dL — ABNORMAL HIGH (ref 0.40–1.50)
GFR: 23.06 mL/min — ABNORMAL LOW (ref >60.00)
Glucose, Bld: 224 mg/dL — ABNORMAL HIGH (ref 70–99)
Potassium: 5.2 mEq/L — ABNORMAL HIGH (ref 3.5–5.1)
Sodium: 139 mEq/L (ref 135–145)
Total Bilirubin: 0.5 mg/dL (ref 0.2–1.2)
Total Protein: 6.2 g/dL (ref 6.0–8.3)

## 2018-12-29 LAB — BRAIN NATRIURETIC PEPTIDE: Pro B Natriuretic peptide (BNP): 1328 pg/mL — ABNORMAL HIGH (ref 0.0–100.0)

## 2018-12-29 NOTE — Assessment & Plan Note (Addendum)
Anemia likely anemia of chronic disease and iron def anemia - improved with iron supplementation No longer on anticoagulation Cbc today

## 2018-12-29 NOTE — Assessment & Plan Note (Signed)
Monitored by home nurse

## 2018-12-29 NOTE — Assessment & Plan Note (Addendum)
BP elevated here but has apparently been controlled at home Will monitor on current medications Advised to have nurse and PT write down numbers and call if elevated cmp

## 2018-12-29 NOTE — Assessment & Plan Note (Signed)
Stable in hospital Referred to nephrology

## 2018-12-29 NOTE — Assessment & Plan Note (Signed)
Lab Results  Component Value Date   HGBA1C 6.5 12/14/2018   Well controlled Continue prandin

## 2018-12-29 NOTE — Assessment & Plan Note (Signed)
Stable Continue aricept 

## 2018-12-29 NOTE — Assessment & Plan Note (Signed)
Would benefit from home PT Will refer for home PT

## 2018-12-29 NOTE — Assessment & Plan Note (Signed)
Appears euvolemic on exam -- his weight does not seem accurate as it was the same prior to him going into the hospital and he was very fluid overloaded  He has SOB but is not sure if it is worse - likely related to decreased EF Continue current lasix dose Will check BNP, cmp Will schedule f/u with cardiology

## 2018-12-29 NOTE — Patient Instructions (Addendum)
Schedule a follow up with Dr Harrington Challenger.   The kidney doctor office should call you to schedule an appointment.    Tests ordered today. Your results will be released to [place] [facility] (or called to you) after review, usually within 72hours after test completion. If any changes need to be made, you will be notified at that same time.   Medications reviewed and updated.  Changes include :   none     Please followup in 3 months

## 2018-12-31 ENCOUNTER — Other Ambulatory Visit: Payer: Self-pay | Admitting: Endocrinology

## 2018-12-31 NOTE — Telephone Encounter (Signed)
Please refill x 3 months Further refills would have to be considered by new PCP   

## 2019-01-01 ENCOUNTER — Telehealth: Payer: Self-pay | Admitting: Internal Medicine

## 2019-01-01 NOTE — Telephone Encounter (Signed)
Notified [name] w/verbal for PT.Marland KitchenJohny Mcmillan

## 2019-01-01 NOTE — Telephone Encounter (Signed)
Copied from [place]. Topic: Quick Communication - Home Health Verbal Orders >> Jan 01, 2019  8:48 AM Berneta Levins wrote: Caller/Agency: Elta Guadel[place] with [facility] Number: [phone], OK to leave a message Requesting OT/PT/Skilled Nursing/Social Work: PT Frequency: 1 week 1, 2 week 4 - starting on 12/31/2018

## 2019-01-04 ENCOUNTER — Encounter: Payer: Self-pay | Admitting: Cardiology

## 2019-01-04 DIAGNOSIS — E11621 Type 2 diabetes mellitus with foot ulcer: Secondary | ICD-10-CM | POA: Diagnosis not present

## 2019-01-05 ENCOUNTER — Ambulatory Visit (INDEPENDENT_AMBULATORY_CARE_PROVIDER_SITE_OTHER): Payer: Medicare Other | Admitting: Cardiology

## 2019-01-05 ENCOUNTER — Encounter (INDEPENDENT_AMBULATORY_CARE_PROVIDER_SITE_OTHER): Payer: Self-pay

## 2019-01-05 ENCOUNTER — Encounter: Payer: Self-pay | Admitting: Cardiology

## 2019-01-05 VITALS — BP 168/84 | HR 60 | Ht 72.0 in | Wt 158.8 lb

## 2019-01-05 DIAGNOSIS — E875 Hyperkalemia: Secondary | ICD-10-CM | POA: Diagnosis not present

## 2019-01-05 DIAGNOSIS — I5022 Chronic systolic (congestive) heart failure: Secondary | ICD-10-CM

## 2019-01-05 DIAGNOSIS — I10 Essential (primary) hypertension: Secondary | ICD-10-CM

## 2019-01-05 DIAGNOSIS — D696 Thrombocytopenia, unspecified: Secondary | ICD-10-CM

## 2019-01-05 DIAGNOSIS — N184 Chronic kidney disease, stage 4 (severe): Secondary | ICD-10-CM | POA: Diagnosis not present

## 2019-01-05 DIAGNOSIS — E785 Hyperlipidemia, unspecified: Secondary | ICD-10-CM

## 2019-01-05 DIAGNOSIS — I429 Cardiomyopathy, unspecified: Secondary | ICD-10-CM

## 2019-01-05 DIAGNOSIS — I2510 Atherosclerotic heart disease of native coronary artery without angina pectoris: Secondary | ICD-10-CM

## 2019-01-05 DIAGNOSIS — I480 Paroxysmal atrial fibrillation: Secondary | ICD-10-CM

## 2019-01-05 DIAGNOSIS — D649 Anemia, unspecified: Secondary | ICD-10-CM

## 2019-01-05 NOTE — Progress Notes (Signed)
Cardiology Office Note   Date:  01/05/2019   ID:  Chad Mcmillan, DOB 1924/11/17, MRN [id number]  PCP:  Binnie Rail, MD  Cardiologist:  [name]    Chief Complaint  Patient presents with  . Hospitalization Follow-up      History of Present Illness: Chad Mcmillan is a 85 y.o. male who presents for hospitalization follow up   CABG, diastolic CHF, diabetes mellitus, chronic kidney disease stage IV complicated by anemia, hypertension, mild thrombocytopenia and early dementia.  He was seen in the office on 09/01/2018 by [name] at which time he was having shortness of breath but no chest pain.  He appeared volume overloaded.  NT proBNP was 20,000.  The patient was given extra Lasix and then daily Lasix 60 mg.  Abs were followed for several weeks with slight improvements.  He was also noted to be anemic with hemoglobin of 7.8, iron was added.  Most recent 11 was 9.1 on 09/28/2018.  OV 11/27/18 He is using a walker. with a wound on his right heal being treated at wound care center. His wt was down significantly and his LE edema is resolved. He lifts his shirt and shows me the loose skin on his abdomen.   He lives with his 6 year old wife and they have people come in to help them. They eat a ham sandwich for lunch (1 slice) and frozen meals at dinner.   He has mild DOE with walking through his house, pushing his walker that is hard to roll over the carpet. He says it doesn't last long. No chest pain/pressure/tightness. He denies lightheadedness or dizziness since his fall in September.   On that visit Lasix decreased to 40 mg daily , BP was low on last visit as well and amlodipine stopped. With labs his lasix was stopped and lisinopril held.  K+ up to 6.2  Sent to hospital and in acute HF.    With hospitalization EF had dropped to 25-30%  Diuresed neg 3 L.  changed to lasix 40 mg BID.   But discharged on 40 mg po- 2 every AM.  His troponin did pk at 0.12.   Pt did not wish to have  cardiac cath with chance for acute renal failure.  He is being treated medically.   With elevated Cr CKD stage IV unable to use ACE/ARB and with recent hyperkalemia no aldactone.  He is on BB and BiDil  He sleeps on 1 pillow and is not dyspnic.  He is walking with boot in place and with walker so he does have DOE that improves with rest.   He hopes to have boot removed soon.    BP here was elevated initially but recheck was 818 systolic and at home he was 127/70 per [facility] RN.  He has [facility] coming out as well.   Past Medical History:  Diagnosis Date  . ADENOCARCINOMA, PROSTATE 06/01/2008  . CORONARY ARTERY DISEASE 06/13/2007  . CVA 06/01/2008   x 2  . DIABETES MELLITUS, TYPE I, WITH OPHTHALMIC COMPLICATIONS 5/63/1497  . 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 HYPERKALEMIA 06/01/2008   due to DM neparopathy  . HYPERLIPIDEMIA 06/13/2007  . HYPERTENSION 06/13/2007  . NEOPLASM, MALIGNANT, SKIN, FACE 06/01/2008  . Orthostatic hypotension 12/19/2018  . Other chronic nonalcoholic liver disease 0/26/3785  . RENAL DISEASE 06/01/2008    Past Surgical History:  Procedure Laterality Date  . Carotid Duplex  09/12/2004  . CATARACT EXTRACTION    .  CHOLECYSTECTOMY    . ESOPHAGOGASTRODUODENOSCOPY  06/28/1991  . FEMUR IM NAIL Right 07/17/2018  . HERNIA REPAIR     right Inguinal hernia  . INTRAMEDULLARY (IM) NAIL INTERTROCHANTERIC N/A 07/17/2018   Procedure: INTRAMEDULLARY (IM) NAIL INTERTROCHANTRIC;  Surgeon: Leandrew Koyanagi, MD;  Location: [facility];  Service: Orthopedics;  Laterality: N/A;  . Rest/Stress Cardiolite  01/22/2001     Current Outpatient Medications  Medication Sig Dispense Refill  . aspirin EC 81 MG tablet Take 1 tablet (81 mg total) by mouth daily. 90 tablet 3  . atorvastatin (LIPITOR) 40 MG tablet TAKE 2 TABLETS BY MOUTH ONCE DAILY 6 IN THE EVENING 60 tablet 2  . donepezil (ARICEPT) 5 MG tablet Take 1 tablet (5 mg total) by mouth at bedtime. 30 tablet 11  . Ferrous Gluconate 324 (37.5 Fe) MG TABS Take 324 mg  by mouth daily.     . furosemide (LASIX) 40 MG tablet Take 1 tablet (40 mg total) by mouth daily. 30 tablet 0  . isosorbide-hydrALAZINE (BIDIL) 20-37.5 MG tablet Take 1 tablet by mouth 3 (three) times daily. 90 tablet 0  . LORazepam (ATIVAN) 0.5 MG tablet Take 1 tablet (0.5 mg total) by mouth at bedtime as needed for anxiety. 30 tablet 0  . meclizine (ANTIVERT) 12.5 MG tablet TAKE 1 TABLET BY MOUTH THREE TIMES DAILY AS NEEDED FOR DIZZINESS 30 tablet 3  . metoprolol tartrate (LOPRESSOR) 25 MG tablet Take 1 tablet (25 mg total) by mouth 2 (two) times daily. 60 tablet 0  . nitroGLYCERIN (NITROSTAT) 0.4 MG SL tablet Place 1 tablet (0.4 mg total) under the tongue every 5 (five) minutes as needed. For chest pain 25 tablet 3  . polyethylene glycol (MIRALAX / GLYCOLAX) packet Take 17 g by mouth daily.    . repaglinide (PRANDIN) 0.5 MG tablet Take 1 tablet (0.5 mg total) by mouth 3 (three) times daily before meals. 270 tablet 3  . senna-docusate (SENOKOT-S) 8.6-50 MG tablet Take 1 tablet by mouth at bedtime as needed for mild constipation or moderate constipation.     No current facility-administered medications for this visit.     Allergies:   Patient has no known allergies.    Social History:  The patient  reports that he quit smoking about 40 years ago. He has never used smokeless tobacco. He reports that he does not drink alcohol or use drugs.   Family History:  The patient's family history includes Vision loss in his son.    ROS:  General:no colds or fevers, no weight changes Skin:no rashes or ulcers HEENT:no blurred vision, no congestion CV:see HPI PUL:see HPI GI:no diarrhea constipation or melena, no indigestion GU:no hematuria, no dysuria MS:no joint pain, no claudication Neuro:no syncope, no lightheadedness Endo:+ diabetes, no thyroid disease  Wt Readings from Last 3 Encounters:  01/05/19 158 lb 12.8 oz (72 kg)  12/29/18 160 lb 12.8 oz (72.9 kg)  12/22/18 150 lb 6.4 oz (68.2 kg)       PHYSICAL EXAM: VS:  BP (!) 168/84   Pulse 60   Ht 6' (1.829 m)   Wt 158 lb 12.8 oz (72 kg)   SpO2 99%   BMI 21.54 kg/m  , BMI Body mass index is 21.54 kg/m. General:Pleasant affect, NAD Skin:Warm and dry, brisk capillary refill HEENT:normocephalic, sclera clear, mucus membranes moist Neck:supple, no JVD, no bruits  Heart:S1S2 RRR without murmur, gallup, rub or click Lungs:clear without rales, rhonchi, or wheezes WGN:FAOZ, non tender, + BS, do not palpate liver  spleen or masses Ext:no to trace lower ext edema,  2+ radial pulses Neuro:alert and oriented X 3, MAE, follows commands, + facial symmetry    EKG:  EKG is NOT ordered today. Hospital EKGs were reviewed with SR and at times freq PACs   Recent Labs: 12/19/2018: B Natriuretic Peptide 1,887.1; Magnesium 2.0; TSH 5.938 12/29/2018: ALT 46; BUN 78; Creatinine, Ser 2.61; Hemoglobin 9.7; Platelets 129.0; Potassium 5.2; Pro B Natriuretic peptide (BNP) 1,328.0; Sodium 139    Lipid Panel    Component Value Date/Time   CHOL 119 12/14/2018 1415   TRIG 148.0 12/14/2018 1415   TRIG 51 09/29/2006 0801   HDL 45.80 12/14/2018 1415   CHOLHDL 3 12/14/2018 1415   VLDL 29.6 12/14/2018 1415   LDLCALC 44 12/14/2018 1415       Other studies Reviewed: Additional studies/ records that were reviewed today include:   Echo 12/20/18. IMPRESSIONS  1. The left ventricle has severely reduced systolic function of 10-17%. The cavity size is mildly increased. There is mild left ventricular wall thickness. Echo evidence of impaired relaxation diastolic filling patterns. Indeterminent filling pressures.  2. Global hypokinesis.  3. Moderately dilated left atrial size.  4. Normal right atrial size.  5. The mitral valve normal in structure. Regurgitation is mild by color flow Doppler.  6. Normal tricuspid valve.  7. The aortic valve tricuspid. There is mild thickening and mild calcification of the aortic valve.  8. The ascending aorta and aortic  rootare normal is size and structure.  9. No atrial level shunt detected by color flow Doppler.  FINDINGS  Left Ventricle: No evidence of left ventricular regional wall motion abnormalities. The left ventricle has severely reduced systolic function of 51-02%. The cavity size is mildly increased. There is mild left ventricular wall thickness. Echo evidence of  impaired relaxation diastolic filling patterns. Indeterminent filling pressures. Global hypokinesis. Right Ventricle: The right ventricle is normal in size. There is normal hypertrophy. There is normal systolic function. Right ventricular systolic pressure is normal with an estimated pressure of 31.9 mmHg. Left Atrium: The left atrium is moderately dilated. Right Atrium: The right atrial size is normal in size. Interatrial Septum: No atrial level shunt detected by color flow Doppler.  Pericardium: There is no evidence of pericardial effusion. There is pleural effusion in the left lateral region. Mitral Valve: The mitral valve normal in structure. Regurgitation is mild by color flow Doppler. Tricuspid Valve: The tricuspid valve is normal in structure. Tricuspid regurgitation is trivial by color flow Doppler. Aortic Valve: The aortic valve tricuspid. There is mild thickening and mild calcification of the aortic valve. Pulmonic Valve: The pulmonic valve is normal. Pulmonic valve regurgitation is not visualized by color flow Doppler. Aorta: The ascending aorta and aortic rootare normal is size and structure. Venous: The inferior vena cava was normal in size with greater than 50% respiratory variablity.   Cardiac tele 08/2018 SInus rhytm with first degree AV block, occsaional Type I 2nd degree AV block Occasoinal junctional beats   With slow heart rates in 20      ASSESSMENT AND PLAN:  1.  Chronic systolic HF, new drop in EF.  Pt preferred medical therapy instead of cath due to CKD 4.  prob ischemic CM.  Pt will keep appt with Richardson Dopp PA. His Pro BNP is elevated but not sure how accurate with his CKD 4.   Wt is up 7 lbs from discharge from hospital in clothes and shoes though.   2.  CM with EF 25-30% new.  On BiDil and BB but need to be careful with BB due to hx of monitor in 10/19 of HR down to 20.  Though not found in hospital this admit.    3.  PAF maintaining SR (by exam today) and has not been candidate for anticoagulation secondary to chronic anemia, did receive 1 unit PRBCs in hospital and thrombocytopenia.    4.  CKD-4 with hyperkalemia at times.  Will refer to Nephrologist.  Pt is agreeable.  This is for guidance with diuretics and further management.  Check BMP through [place] next week.  5.    HLD on statin  6.  Anemia of chronic kidney disease.  Has had work up in past and felt due to his CKD.  He was transfused 1 unit PRBCs - Hgb most recently 2/11 was 9.7   7.  Thrombocytopenia plts most recently 129   8.  HTN labile today but was 127/70 at home no changes currently.    9.  CAD with last cath 2000 LAD 50%, 70%, Diag 90% LCX 70 to 80%. LVEF 70         Current medicines are reviewed with the patient today.  The patient Has no concerns regarding medicines.  The following changes have been made:  See above Labs/ tests ordered today include:see above  Disposition:   FU:  see above  Signed, Cecilie Kicks, NP  01/05/2019 3:46 PM    [facility] [place], [place], [address] [place], [place] Phone: [phone]; Fax: [phone]

## 2019-01-05 NOTE — Patient Instructions (Signed)
Medication Instructions:  Your physician recommends that you continue on your current medications as directed. Please refer to the Current Medication list given to you today.  If you need a refill on your cardiac medications before your next appointment, please call your pharmacy.   Lab work: FUTURE: BMET on Wednesday (to be done by [facility]) If you have labs (blood work) drawn today and your tests are completely normal, you will receive your results only by: Marland Kitchen MyChart Message (if you have MyChart) OR . A paper copy in the mail If you have any lab test that is abnormal or we need to change your treatment, we will call you to review the results.  Testing/Procedures: None  Follow-Up: Follow up visit with Richardson Dopp PA-C on 01/27/2019 @ 11:15 AM   You have been referred to [place], someone will contact you with an appointment   Any Other Special Instructions Will Be Listed Below (If Applicable).

## 2019-01-12 ENCOUNTER — Telehealth: Payer: Self-pay | Admitting: Cardiology

## 2019-01-12 NOTE — Telephone Encounter (Signed)
Call [facility] and spoke with Cherlyn Cushing, and she will fax lab results to [phone].

## 2019-01-12 NOTE — Telephone Encounter (Signed)
Spoke with [place] with [facility] re: pt.  She is aware that we need a repeat BMET in 2 weeks and to fax lab results to [phone] attn:  Jeanann Lewandowsky, RMA.  Will fax lab order to [phone]

## 2019-01-12 NOTE — Telephone Encounter (Signed)
° ° °  [facility] faxing abnormal labs to [facility]

## 2019-01-12 NOTE — Telephone Encounter (Signed)
Repeat BMP through [facility] in 2 weeks.

## 2019-01-12 NOTE — Telephone Encounter (Signed)
Spoke with Delrae Rend from [facility], pt has an appt 02/15/2019 and advised that is the soonest they can see the pt.

## 2019-01-18 ENCOUNTER — Encounter (HOSPITAL_BASED_OUTPATIENT_CLINIC_OR_DEPARTMENT_OTHER): Payer: Medicare Other | Attending: Internal Medicine

## 2019-01-18 ENCOUNTER — Other Ambulatory Visit (HOSPITAL_COMMUNITY)
Admission: RE | Admit: 2019-01-18 | Discharge: 2019-01-18 | Disposition: A | Discharge status: Home / Self Care | Payer: Medicare Other | Source: Other Acute Inpatient Hospital | Attending: Internal Medicine | Admitting: Internal Medicine

## 2019-01-18 DIAGNOSIS — L97412 Non-pressure chronic ulcer of right heel and midfoot with fat layer exposed: Secondary | ICD-10-CM | POA: Diagnosis not present

## 2019-01-18 DIAGNOSIS — E11621 Type 2 diabetes mellitus with foot ulcer: Secondary | ICD-10-CM | POA: Insufficient documentation

## 2019-01-18 DIAGNOSIS — E1142 Type 2 diabetes mellitus with diabetic polyneuropathy: Secondary | ICD-10-CM | POA: Insufficient documentation

## 2019-01-18 DIAGNOSIS — B9562 Methicillin resistant Staphylococcus aureus infection as the cause of diseases classified elsewhere: Secondary | ICD-10-CM | POA: Diagnosis not present

## 2019-01-18 DIAGNOSIS — L03115 Cellulitis of right lower limb: Secondary | ICD-10-CM | POA: Insufficient documentation

## 2019-01-21 LAB — AEROBIC CULTURE W GRAM STAIN (SUPERFICIAL SPECIMEN)

## 2019-01-21 LAB — AEROBIC CULTURE  (SUPERFICIAL SPECIMEN)

## 2019-01-25 ENCOUNTER — Other Ambulatory Visit: Payer: Self-pay

## 2019-01-25 DIAGNOSIS — E11621 Type 2 diabetes mellitus with foot ulcer: Secondary | ICD-10-CM | POA: Diagnosis not present

## 2019-01-25 MED ORDER — ISOSORB DINITRATE-HYDRALAZINE 20-37.5 MG PO TABS: 1.0000 | ORAL_TABLET | Freq: Three times a day (TID) | ORAL | 2 refills | Status: AC

## 2019-01-25 MED ORDER — METOPROLOL TARTRATE 25 MG PO TABS: 25.0000 mg | ORAL_TABLET | Freq: Two times a day (BID) | ORAL | 2 refills | Status: AC

## 2019-01-27 ENCOUNTER — Other Ambulatory Visit: Payer: Self-pay

## 2019-01-27 ENCOUNTER — Encounter: Payer: Self-pay | Admitting: Physician Assistant

## 2019-01-27 ENCOUNTER — Ambulatory Visit (INDEPENDENT_AMBULATORY_CARE_PROVIDER_SITE_OTHER): Payer: Medicare Other | Admitting: Physician Assistant

## 2019-01-27 VITALS — BP 144/48 | HR 64 | Ht 72.0 in | Wt 151.6 lb

## 2019-01-27 DIAGNOSIS — I5022 Chronic systolic (congestive) heart failure: Secondary | ICD-10-CM | POA: Diagnosis not present

## 2019-01-27 DIAGNOSIS — I10 Essential (primary) hypertension: Secondary | ICD-10-CM

## 2019-01-27 DIAGNOSIS — N184 Chronic kidney disease, stage 4 (severe): Secondary | ICD-10-CM

## 2019-01-27 DIAGNOSIS — I2510 Atherosclerotic heart disease of native coronary artery without angina pectoris: Secondary | ICD-10-CM

## 2019-01-27 DIAGNOSIS — I480 Paroxysmal atrial fibrillation: Secondary | ICD-10-CM

## 2019-01-27 NOTE — Patient Instructions (Signed)
Medication Instructions:   Your physician recommends that you continue on your current medications as directed. Please refer to the Current Medication list given to you today.   If you need a refill on your cardiac medications before your next appointment, please call your pharmacy.   Lab work: NONE ORDERED  TODAY   If you have labs (blood work) drawn today and your tests are completely normal, you will receive your results only by: Marland Kitchen MyChart Message (if you have MyChart) OR . A paper copy in the mail If you have any lab test that is abnormal or we need to change your treatment, we will call you to review the results.  Testing/Procedures: NONE ORDERED  TODAY    Follow-Up: At [facility], you and your health needs are our priority.  As part of our continuing mission to provide you with exceptional heart care, we have created designated Provider Care Teams.  These Care Teams include your primary Cardiologist (physician) and Advanced Practice Providers (APPs -  Physician Assistants and Nurse Practitioners) who all work together to provide you with the care you need, when you need it. You will need a follow up appointment in:  3 months.  Please call our office 2 months in advance to schedule this appointment.  You may see Dorris Carnes, MD or one of the following Advanced Practice Providers on your designated Care Team: Richardson Dopp, PA-C [place], [place] . Daune Perch, NP  Any Other Special Instructions Will Be Listed Below (If Applicable).

## 2019-01-27 NOTE — Progress Notes (Addendum)
Cardiology Office Note:    Date:  01/27/2019   ID:  Chad Mcmillan, DOB 08-01-1924, MRN [id number]  PCP:  Binnie Rail, MD  Cardiologist:  Dorris Carnes, MD  Electrophysiologist:  None   Referring MD: Binnie Rail, MD   Chief Complaint  Patient presents with   Follow-up    CHF    History of Present Illness:    Chad Mcmillan is a 85 y.o. male with coronary artery disease, diabetes, hypertension, prior stroke, chronic kidney disease stage IV, chronic anemia, thrombocytopenia and dementia.  He had paroxysmal atrial fibrillation after surgery for hip fracture in August 2019.  Hospitalization was also complicated by acute diastolic heart failure.  He was on Eliquis for a short time but this was later discontinued secondary to worsening anemia.  His bleeding risk is felt to be elevated and therefore, anticoagulation has been deferred.    He was admitted in February 2020 with decompensated heart failure and hyperkalemia in the setting of chronic kidney disease.  He was noted to have nonsustained ventricular tachycardia.  He had mildly elevated troponin levels, likely related to demand ischemia.  Echocardiogram demonstrated reduced LV function with an EF of 25-30%.  It was suspected that his cardiomyopathy was ischemic.  However, invasive evaluation was deferred secondary to advanced renal disease.  The patient preferred conservative/medical therapy.  He was last seen in clinic by Cecilie Kicks, NP 01/05/2019.  He was referred to Nephrology.     Mr. Juhasz returns for follow-up.  He is here with his son.  He continues to have shortness of breath with minimal activity.  He denies orthopnea, PND or significant lower extremity swelling.  He has not had chest discomfort or syncope.  Prior CV studies:   The following studies were reviewed today:  Echocardiogram 12/20/2018 EF 25-30, mild LVH, impaired relaxation, global HK, moderate LAE, mild MR, mild calcification of the aortic valve  Event monitor  08/18/2018 SInus rhytm with first degree AV block, occsaional Type I 2nd degree AV block Occasoinal junctional beats   With slow heart rates in 20   Echocardiogram 07/21/2018 Mild focal basal septal hypertrophy, EF 28-36, grade 1 diastolic dysfunction, trivial MR, mild to moderate LAE, trivial TR, left pleural effusion  Myoview 06/05/2009 Small  inferior wall infarct at mid and baseal level with no ishcemia Low Risk  Cardiac Catheterization 2000 LAD 50, 70 Dx 90 LCx 70-80 EF 70  Past Medical History:  Diagnosis Date   ADENOCARCINOMA, PROSTATE 06/01/2008   CORONARY ARTERY DISEASE 06/13/2007   CVA 06/01/2008   x 2   DIABETES MELLITUS, TYPE I, WITH OPHTHALMIC COMPLICATIONS 05/16/4764   DM retinopathy ([place])    HYPERKALEMIA 06/01/2008   due to DM neparopathy   HYPERLIPIDEMIA 06/13/2007   HYPERTENSION 06/13/2007   NEOPLASM, MALIGNANT, SKIN, FACE 06/01/2008   Orthostatic hypotension 12/19/2018   Other chronic nonalcoholic liver disease 4/65/0354   RENAL DISEASE 06/01/2008   Surgical Hx: The patient  has a past surgical history that includes Cataract extraction; Cholecystectomy; Hernia repair; Esophagogastroduodenoscopy (06/28/1991); Rest/Stress Cardiolite (01/22/2001); Carotid Duplex (09/12/2004); Femur IM nail (Right, 07/17/2018); and Intramedullary (im) nail intertrochanteric (N/A, 07/17/2018).   Current Medications: Current Meds  Medication Sig   aspirin EC 81 MG tablet Take 1 tablet (81 mg total) by mouth daily.   atorvastatin (LIPITOR) 40 MG tablet TAKE 2 TABLETS BY MOUTH ONCE DAILY 6 IN THE EVENING   cephALEXin (KEFLEX) 500 MG capsule TAKE 1 CAPSULE BY MOUTH EVERY 6 HOURS FOR  7 DAYS   donepezil (ARICEPT) 5 MG tablet Take 1 tablet (5 mg total) by mouth at bedtime.   Ferrous Gluconate 324 (37.5 Fe) MG TABS Take 324 mg by mouth daily.    furosemide (LASIX) 40 MG tablet Take 40 mg by mouth daily.   isosorbide-hydrALAZINE (BIDIL) 20-37.5 MG tablet Take 1 tablet by mouth 3  (three) times daily.   LORazepam (ATIVAN) 0.5 MG tablet Take 1 tablet (0.5 mg total) by mouth at bedtime as needed for anxiety.   meclizine (ANTIVERT) 12.5 MG tablet TAKE 1 TABLET BY MOUTH THREE TIMES DAILY AS NEEDED FOR DIZZINESS   metoprolol tartrate (LOPRESSOR) 25 MG tablet Take 1 tablet (25 mg total) by mouth 2 (two) times daily.   nitroGLYCERIN (NITROSTAT) 0.4 MG SL tablet Place 1 tablet (0.4 mg total) under the tongue every 5 (five) minutes as needed. For chest pain   polyethylene glycol (MIRALAX / GLYCOLAX) packet Take 17 g by mouth daily.   repaglinide (PRANDIN) 0.5 MG tablet Take 1 tablet (0.5 mg total) by mouth 3 (three) times daily before meals.   senna-docusate (SENOKOT-S) 8.6-50 MG tablet Take 1 tablet by mouth at bedtime as needed for mild constipation or moderate constipation.   [DISCONTINUED] furosemide (LASIX) 40 MG tablet Take 1 tablet (40 mg total) by mouth daily. (Patient taking differently: Take 80 mg by mouth daily. Take 2 of the 40 mg tabs daily)     Allergies:   Patient has no known allergies.   Social History   Tobacco Use   Smoking status: Former Smoker    Last attempt to quit: 11/18/1978    Years since quitting: 40.2   Smokeless tobacco: Never Used  Substance Use Topics   Alcohol use: No   Drug use: No     Family Hx: The patient's family history includes Vision loss in his son.  ROS:   Please see the history of present illness.    ROS All other systems reviewed and are negative.   EKGs/Labs/Other Test Reviewed:    EKG:  EKG is  ordered today.  The ekg ordered today demonstrates sinus rhythm, heart rate 68, first-degree AV block, PR 314, nonspecific interventricular conduction delay, ST depression V5-V6, QTC 482, similar to prior tracings  Recent Labs: 12/19/2018: B Natriuretic Peptide 1,887.1; Magnesium 2.0; TSH 5.938 12/29/2018: ALT 46; BUN 78; Creatinine, Ser 2.61; Hemoglobin 9.7; Platelets 129.0; Potassium 5.2; Pro B Natriuretic peptide  (BNP) 1,328.0; Sodium 139   Recent Lipid Panel Lab Results  Component Value Date/Time   CHOL 119 12/14/2018 02:15 PM   TRIG 148.0 12/14/2018 02:15 PM   TRIG 51 09/29/2006 08:01 AM   HDL 45.80 12/14/2018 02:15 PM   CHOLHDL 3 12/14/2018 02:15 PM   LDLCALC 44 12/14/2018 02:15 PM     Physical Exam:    VS:  BP (!) 144/48    Pulse 64    Ht 6' (1.829 m)    Wt 151 lb 9.6 oz (68.8 kg)    SpO2 97%    BMI 20.56 kg/m     Wt Readings from Last 3 Encounters:  01/27/19 151 lb 9.6 oz (68.8 kg)  01/05/19 158 lb 12.8 oz (72 kg)  12/29/18 160 lb 12.8 oz (72.9 kg)     Physical Exam  Constitutional: He is oriented to person, place, and time. He appears well-developed and well-nourished.  He appears to display Cheyne Stokes respirations at times  HENT:  Head: Normocephalic and atraumatic.  Eyes: No scleral icterus.  Neck: No  JVD present. No thyromegaly present.  Cardiovascular: Normal rate and regular rhythm.  Pulmonary/Chest: Effort normal. He has no rales.  Abdominal: He exhibits no distension.  Musculoskeletal:        General: No edema.  Lymphadenopathy:    He has no cervical adenopathy.  Neurological: He is alert and oriented to person, place, and time.  Skin: Skin is warm and dry.  Psychiatric: He has a normal mood and affect.    ASSESSMENT & PLAN:    Chronic systolic HF (heart failure) (HCC) EF 25-30 by echo in 12/2018.  He is NYHA 3a-3b.  Cardiomyopathy is suspected to be ischemic given known CAD.  His volume status appears stable.  He is not on ACE inhibitor, angiotensin receptor blocker or spironolactone due to advanced CKD.  He is not a candidate for advanced therapies for congestive heart failure.  Given his frail status, I do not think he can tolerate adjusting his BiDil further.  He has had some bradycardia noted in the past.  Therefore, I will not adjust his beta-blocker further.  I had a long discussion with the patient and his son regarding end of life issues.  I did explain  that his heart failure will only get worse.  His comorbid illnesses will further contribute to his decline.  I asked the patient if he would want life saving measures taken should he have sudden cardiac death.  He expressed that he wants to be a full code at this time, stating "I want everything done".    Coronary artery disease  Diffuse 3 v CAD on Cardiac Catheterization in 2000.  Myoview was low risk in 2010.  With the decline in his EF, it is suspected he has an ischemic cardiomyopathy.  He is not having angina.  Continue ASA, statin, beta-blocker.  He declined aggressive/invasive ischemic testing in the hospital.  Nuclear stress testing would not provide much benefit.  He is at high extremely high risk for contrast induced nephropathy.  Continue conservative, medical management.    Paroxysmal atrial fibrillation with RVR (HCC)  He is maintaining normal sinus rhythm.  He is not a candidate for anticoagulation.    CKD (chronic kidney disease), stage IV [facility]) Nephrology referral pending.  Recent BMET obtained with home health.  I will request those labs.  Essential hypertension Borderline control.  Continue current Rx.     Dispo:  Return in about 3 months (around 04/29/2019) for Routine Follow Up, w/ [name].   Medication Adjustments/Labs and Tests Ordered: Current medicines are reviewed at length with the patient today.  Concerns regarding medicines are outlined above.  Tests Ordered: Orders Placed This Encounter  Procedures   EKG 12-Lead   Medication Changes: No orders of the defined types were placed in this encounter.   Signed, Richardson Dopp, PA-C  01/27/2019 5:06 PM    [facility] [place], [place], [place]  [address] Phone: [phone]; Fax: [phone]

## 2019-01-28 ENCOUNTER — Telehealth: Payer: Self-pay | Admitting: Physician Assistant

## 2019-01-28 NOTE — Telephone Encounter (Signed)
Scott,   Labs that was drawn on 01-26-19 were drawn at another facility?  Didn't see in Epic under labs or scanned in media.

## 2019-01-28 NOTE — Telephone Encounter (Signed)
Yes, BMET was drawn elsewhere and run at [facility] lab. They were faxed to [place] today.  I have the paper copy. Richardson Dopp, PA-C    01/28/2019 4:01 PM

## 2019-01-28 NOTE — Telephone Encounter (Signed)
Labs from 01/26/2019: K+ 4.9, BUN 92, Creatinine 2.50. Please call patient. Renal function stable.  But, elevated BUN indicates he is likely getting "too dry".   PLAN:  1. Reduce Lasix to 40 mg every other day alternated with 20 mg every other day. 2. Repeat BMET 2 weeks  Richardson Dopp, PA-C    01/28/2019 11:31 AM

## 2019-01-29 NOTE — Telephone Encounter (Signed)
SPOKE WTH PT AND HE VERBALIZED UNDERSTANDING OF LASIX 40 MG ALTERNATING 20 MG EVERY OTHER DAY.   SPOKE WITH [facility] AT [place] WILL FAX OVER LAST TWO NURSE VISIT AND REPEAT BMET WEEK OF 02-11-19

## 2019-02-01 DIAGNOSIS — E11621 Type 2 diabetes mellitus with foot ulcer: Secondary | ICD-10-CM | POA: Diagnosis not present

## 2019-02-15 ENCOUNTER — Telehealth: Payer: Self-pay | Admitting: *Deleted

## 2019-02-15 NOTE — Telephone Encounter (Signed)
DPR ok to s/w pt's son Tommie Raymond. Pt's sone has been notified of lab results by phone with verbal understanding. Pt's son does states pt saw Nephrology today and states she is in agreement with the alternating dose of lasix 40 mg alternate with 20 mg every other day per Richardson Dopp, PAC. Tommie Raymond did state that he thought pt was supposed to only do the alternating dose for 14 days then resume his regular dose of lasix. He said he has only for the last 2-3 days had pt taking lasix 40 mg daily. He did say he is going to resume the alternating dose lasix 40 mg QOD w/alternating dose lasix 20 mg QOD, per Nephrologist in agreement with alternating dose. I advised I will Richardson Dopp, PAC know of our talk today and if he has any other instructions either from him or [name] we will let them know. Tommie Raymond again thanked me for all we do for his dad.  Pt's son thanked me for the call and our help.

## 2019-02-16 DIAGNOSIS — E11621 Type 2 diabetes mellitus with foot ulcer: Secondary | ICD-10-CM | POA: Diagnosis not present

## 2019-02-16 NOTE — Telephone Encounter (Signed)
Ok.  Dr. Johnney Ou notified me yesterday. Richardson Dopp, PA-C    02/16/2019 10:19 AM

## 2019-02-24 ENCOUNTER — Telehealth: Payer: Self-pay | Admitting: Internal Medicine

## 2019-02-24 NOTE — Telephone Encounter (Signed)
Copied from [place] [id number]. Topic: Quick Communication - Home Health Verbal Orders >> Feb 24, 2019  3:36 PM Gustavus Messing wrote: Caller/Agency: [facility] Number: [phone] April Requesting OT/PT/Skilled Nursing/Social Work/Speech Therapy: Skilled nursing Frequency: 2 times a week for 9 weeks

## 2019-02-25 NOTE — Telephone Encounter (Signed)
Called April no answer LMOM w/MD response../lmb 

## 2019-02-25 NOTE — Telephone Encounter (Signed)
ok 

## 2019-03-02 ENCOUNTER — Encounter (HOSPITAL_BASED_OUTPATIENT_CLINIC_OR_DEPARTMENT_OTHER): Payer: Medicare Other | Attending: Internal Medicine

## 2019-03-02 DIAGNOSIS — I10 Essential (primary) hypertension: Secondary | ICD-10-CM | POA: Diagnosis not present

## 2019-03-02 DIAGNOSIS — Z09 Encounter for follow-up examination after completed treatment for conditions other than malignant neoplasm: Secondary | ICD-10-CM | POA: Insufficient documentation

## 2019-03-02 DIAGNOSIS — E1142 Type 2 diabetes mellitus with diabetic polyneuropathy: Secondary | ICD-10-CM | POA: Insufficient documentation

## 2019-03-02 DIAGNOSIS — Z8631 Personal history of diabetic foot ulcer: Secondary | ICD-10-CM | POA: Insufficient documentation

## 2019-03-02 DIAGNOSIS — F0390 Unspecified dementia without behavioral disturbance: Secondary | ICD-10-CM | POA: Insufficient documentation

## 2019-03-02 DIAGNOSIS — Z923 Personal history of irradiation: Secondary | ICD-10-CM | POA: Diagnosis not present

## 2019-03-05 ENCOUNTER — Other Ambulatory Visit: Payer: Self-pay | Admitting: Endocrinology

## 2019-03-05 NOTE — Telephone Encounter (Signed)
Please refill x 1 f/u is due

## 2019-03-08 ENCOUNTER — Other Ambulatory Visit: Payer: Self-pay

## 2019-03-08 DIAGNOSIS — E785 Hyperlipidemia, unspecified: Secondary | ICD-10-CM

## 2019-03-08 DIAGNOSIS — I480 Paroxysmal atrial fibrillation: Secondary | ICD-10-CM

## 2019-03-08 DIAGNOSIS — E11319 Type 2 diabetes mellitus with unspecified diabetic retinopathy without macular edema: Secondary | ICD-10-CM

## 2019-03-08 DIAGNOSIS — Z7982 Long term (current) use of aspirin: Secondary | ICD-10-CM

## 2019-03-08 DIAGNOSIS — Z8546 Personal history of malignant neoplasm of prostate: Secondary | ICD-10-CM

## 2019-03-08 DIAGNOSIS — D631 Anemia in chronic kidney disease: Secondary | ICD-10-CM

## 2019-03-08 DIAGNOSIS — N184 Chronic kidney disease, stage 4 (severe): Secondary | ICD-10-CM

## 2019-03-08 DIAGNOSIS — I951 Orthostatic hypotension: Secondary | ICD-10-CM

## 2019-03-08 DIAGNOSIS — I5043 Acute on chronic combined systolic (congestive) and diastolic (congestive) heart failure: Secondary | ICD-10-CM | POA: Diagnosis not present

## 2019-03-08 DIAGNOSIS — Z85828 Personal history of other malignant neoplasm of skin: Secondary | ICD-10-CM

## 2019-03-08 DIAGNOSIS — Z7984 Long term (current) use of oral hypoglycemic drugs: Secondary | ICD-10-CM

## 2019-03-08 DIAGNOSIS — Z8673 Personal history of transient ischemic attack (TIA), and cerebral infarction without residual deficits: Secondary | ICD-10-CM

## 2019-03-08 DIAGNOSIS — I130 Hypertensive heart and chronic kidney disease with heart failure and stage 1 through stage 4 chronic kidney disease, or unspecified chronic kidney disease: Secondary | ICD-10-CM | POA: Diagnosis not present

## 2019-03-08 DIAGNOSIS — L89612 Pressure ulcer of right heel, stage 2: Secondary | ICD-10-CM

## 2019-03-08 DIAGNOSIS — I2510 Atherosclerotic heart disease of native coronary artery without angina pectoris: Secondary | ICD-10-CM

## 2019-03-08 DIAGNOSIS — E1122 Type 2 diabetes mellitus with diabetic chronic kidney disease: Secondary | ICD-10-CM | POA: Diagnosis not present

## 2019-03-08 DIAGNOSIS — Z9181 History of falling: Secondary | ICD-10-CM

## 2019-03-08 MED ORDER — GLUCOSE BLOOD VI STRP: ORAL_STRIP | 0 refills | Status: DC

## 2019-03-15 ENCOUNTER — Ambulatory Visit: Payer: Medicare Other | Admitting: Internal Medicine

## 2019-03-15 ENCOUNTER — Telehealth: Payer: Self-pay | Admitting: Endocrinology

## 2019-03-15 NOTE — Telephone Encounter (Signed)
MEDICATION:   PHARMACY:    IS THIS A 90 DAY SUPPLY :   IS PATIENT OUT OF MEDICATION:   IF NOT; HOW MUCH IS LEFT:   LAST APPOINTMENT DATE: @4 /20/2020  NEXT APPOINTMENT DATE:@Visit  date not found  DO WE HAVE YOUR PERMISSION TO LEAVE A DETAILED MESSAGE:  OTHER COMMENTS:    **Let patient know to contact pharmacy at the end of the day to make sure medication is ready. **  ** Please notify patient to allow 48-72 hours to process**  **Encourage patient to contact the pharmacy for refills or they can request refills through [facility]**

## 2019-03-15 NOTE — Telephone Encounter (Signed)
Was this opened and sent erroneously?

## 2019-03-17 ENCOUNTER — Other Ambulatory Visit: Payer: Self-pay

## 2019-03-17 ENCOUNTER — Encounter: Payer: Self-pay | Admitting: Endocrinology

## 2019-03-17 ENCOUNTER — Ambulatory Visit (INDEPENDENT_AMBULATORY_CARE_PROVIDER_SITE_OTHER): Payer: Medicare Other | Admitting: Endocrinology

## 2019-03-17 ENCOUNTER — Other Ambulatory Visit: Payer: Self-pay | Admitting: Endocrinology

## 2019-03-17 ENCOUNTER — Telehealth: Payer: Self-pay | Admitting: Endocrinology

## 2019-03-17 DIAGNOSIS — Z794 Long term (current) use of insulin: Secondary | ICD-10-CM

## 2019-03-17 DIAGNOSIS — E1122 Type 2 diabetes mellitus with diabetic chronic kidney disease: Secondary | ICD-10-CM | POA: Diagnosis not present

## 2019-03-17 DIAGNOSIS — E0800 Diabetes mellitus due to underlying condition with hyperosmolarity without nonketotic hyperglycemic-hyperosmolar coma (NKHHC): Secondary | ICD-10-CM

## 2019-03-17 DIAGNOSIS — N184 Chronic kidney disease, stage 4 (severe): Secondary | ICD-10-CM | POA: Diagnosis not present

## 2019-03-17 DIAGNOSIS — E1121 Type 2 diabetes mellitus with diabetic nephropathy: Secondary | ICD-10-CM | POA: Insufficient documentation

## 2019-03-17 DIAGNOSIS — I2510 Atherosclerotic heart disease of native coronary artery without angina pectoris: Secondary | ICD-10-CM | POA: Diagnosis not present

## 2019-03-17 DIAGNOSIS — E119 Type 2 diabetes mellitus without complications: Secondary | ICD-10-CM

## 2019-03-17 MED ORDER — GLUCOSE BLOOD VI STRP: ORAL_STRIP | 11 refills | Status: DC

## 2019-03-17 MED ORDER — ONETOUCH DELICA LANCETS 33G MISC: 0 refills | Status: DC

## 2019-03-17 MED ORDER — GLUCOSE BLOOD VI STRP: ORAL_STRIP | 11 refills | Status: AC

## 2019-03-17 NOTE — Progress Notes (Addendum)
Subjective:    Patient ID: Chad Mcmillan, male    DOB: Aug 13, 1924, 85 y.o.   MRN: [id number]  HPI  telehealth visit today via doxy video visit.  Alternatives to telehealth are presented to this patient, and the patient agrees to the telehealth visit. Pt is advised of the cost of the visit, and agrees to this, also.   Patient is at home, and I am at the office.   Persons attending the telehealth visit: the patient, his son, and I.   Pt returns for f/u of diabetes mellitus:  DM type: 2 Dx'ed: 1989.   Complications: renal insuff, CAD, retinopathy, PAD, foot ulcer, polyneuropathy, and CVA.   Therapy: repaglinide DKA: never.  Severe hypoglycemia: never.  Pancreatitis: never.   Other: he took insulin 2004-2019.  Interval hx: pt says cbg varies from 79-330, but most are in the low-100's.  Main symptom is doe.   Past Medical History:  Diagnosis Date  . ADENOCARCINOMA, PROSTATE 06/01/2008  . CORONARY ARTERY DISEASE 06/13/2007  . CVA 06/01/2008   x 2  . DIABETES MELLITUS, TYPE I, WITH OPHTHALMIC COMPLICATIONS 9/98/3382  . DM retinopathy ([place])   . HYPERKALEMIA 06/01/2008   due to DM neparopathy  . HYPERLIPIDEMIA 06/13/2007  . HYPERTENSION 06/13/2007  . NEOPLASM, MALIGNANT, SKIN, FACE 06/01/2008  . Orthostatic hypotension 12/19/2018  . Other chronic nonalcoholic liver disease 03/23/3975  . RENAL DISEASE 06/01/2008    Past Surgical History:  Procedure Laterality Date  . Carotid Duplex  09/12/2004  . CATARACT EXTRACTION    . CHOLECYSTECTOMY    . ESOPHAGOGASTRODUODENOSCOPY  06/28/1991  . FEMUR IM NAIL Right 07/17/2018  . HERNIA REPAIR     right Inguinal hernia  . INTRAMEDULLARY (IM) NAIL INTERTROCHANTERIC N/A 07/17/2018   Procedure: INTRAMEDULLARY (IM) NAIL INTERTROCHANTRIC;  Surgeon: Leandrew Koyanagi, MD;  Location: [place];  Service: Orthopedics;  Laterality: N/A;  . Rest/Stress Cardiolite  01/22/2001    Social History   Socioeconomic History  . Marital status: Married    Spouse name: Not  on file  . Number of children: Not on file  . Years of education: Not on file  . Highest education level: Not on file  Occupational History  . Occupation: Retired    Fish farm manager: RETIRED  Social Needs  . Financial resource strain: Not on file  . Food insecurity:    Worry: Not on file    Inability: Not on file  . Transportation needs:    Medical: Not on file    Non-medical: Not on file  Tobacco Use  . Smoking status: Former Smoker    Last attempt to quit: 11/18/1978    Years since quitting: 40.3  . Smokeless tobacco: Never Used  Substance and Sexual Activity  . Alcohol use: No  . Drug use: No  . Sexual activity: Not Currently  Lifestyle  . Physical activity:    Days per week: Not on file    Minutes per session: Not on file  . Stress: Not on file  Relationships  . Social connections:    Talks on phone: Not on file    Gets together: Not on file    Attends religious service: Not on file    Active member of club or organization: Not on file    Attends meetings of clubs or organizations: Not on file    Relationship status: Not on file  . Intimate partner violence:    Fear of current or ex partner: Not on file  Emotionally abused: Not on file    Physically abused: Not on file    Forced sexual activity: Not on file  Other Topics Concern  . Not on file  Social History Narrative  . Not on file    Current Outpatient Medications on File Prior to Visit  Medication Sig Dispense Refill  . aspirin EC 81 MG tablet Take 1 tablet (81 mg total) by mouth daily. 90 tablet 3  . atorvastatin (LIPITOR) 40 MG tablet TAKE 2 TABLETS BY MOUTH ONCE DAILY 6 IN THE EVENING 60 tablet 2  . cephALEXin (KEFLEX) 500 MG capsule TAKE 1 CAPSULE BY MOUTH EVERY 6 HOURS FOR 7 DAYS    . donepezil (ARICEPT) 5 MG tablet Take 1 tablet (5 mg total) by mouth at bedtime. 30 tablet 11  . Ferrous Gluconate 324 (37.5 Fe) MG TABS Take 324 mg by mouth daily.     . furosemide (LASIX) 40 MG tablet Take 40 mg by mouth  every other day. Alternate 40 mg and 20 mg every other day     . isosorbide-hydrALAZINE (BIDIL) 20-37.5 MG tablet Take 1 tablet by mouth 3 (three) times daily. 270 tablet 2  . LORazepam (ATIVAN) 0.5 MG tablet Take 1 tablet (0.5 mg total) by mouth at bedtime as needed for anxiety. 30 tablet 0  . meclizine (ANTIVERT) 12.5 MG tablet TAKE 1 TABLET BY MOUTH THREE TIMES DAILY AS NEEDED FOR DIZZINESS 30 tablet 3  . metoprolol tartrate (LOPRESSOR) 25 MG tablet Take 1 tablet (25 mg total) by mouth 2 (two) times daily. 180 tablet 2  . nitroGLYCERIN (NITROSTAT) 0.4 MG SL tablet Place 1 tablet (0.4 mg total) under the tongue every 5 (five) minutes as needed. For chest pain 25 tablet 3  . polyethylene glycol (MIRALAX / GLYCOLAX) packet Take 17 g by mouth daily.    . repaglinide (PRANDIN) 0.5 MG tablet Take 1 tablet (0.5 mg total) by mouth 3 (three) times daily before meals. 270 tablet 3  . senna-docusate (SENOKOT-S) 8.6-50 MG tablet Take 1 tablet by mouth at bedtime as needed for mild constipation or moderate constipation.     No current facility-administered medications on file prior to visit.     No Known Allergies  Family History  Problem Relation Age of Onset  . Vision loss Son      Review of Systems He denies hypoglycemia.      Objective:   Physical Exam    Lab Results  Component Value Date   HGBA1C 6.5 12/14/2018   Lab Results  Component Value Date   CREATININE 2.61 (H) 12/29/2018   BUN 78 (H) 12/29/2018   NA 139 12/29/2018   K 5.2 (H) 12/29/2018   CL 105 12/29/2018   CO2 25 12/29/2018       Assessment & Plan:  Insulin-requiring type 2 DM, with PAD: apparently well-controlled Renal failure: in this setting, he does not need basal insulin.  Frail elderly state: he is not a candidate for aggressive glycemic control   Patient Instructions  Please come in for the A1c.   Based on the results, we'll reduce the repaglinide to twice a day.   check your blood sugar once a day.   vary the time of day when you check, between before the 3 meals, and at bedtime.  also check if you have symptoms of your blood sugar being too high or too low.  please keep a record of the readings and bring it to your next appointment here (or you  can bring the meter itself).  You can write it on any piece of paper.  please call us sooner if your blood sugar goes below 70, or if you have a lot of readings over 200. Please come back for a follow-up appointment in 3-4  months.

## 2019-03-17 NOTE — Telephone Encounter (Signed)
Overdue for an appt. Pt scheduled for virtual visit today

## 2019-03-17 NOTE — Patient Instructions (Addendum)
Please come in for the A1c.   Based on the results, we'll reduce the repaglinide to twice a day.   check your blood sugar once a day.  vary the time of day when you check, between before the 3 meals, and at bedtime.  also check if you have symptoms of your blood sugar being too high or too low.  please keep a record of the readings and bring it to your next appointment here (or you can bring the meter itself).  You can write it on any piece of paper.  please call us sooner if your blood sugar goes below 70, or if you have a lot of readings over 200. Please come back for a follow-up appointment in 3-4  months.

## 2019-03-17 NOTE — Telephone Encounter (Signed)
MEDICATION: glucose blood (ONE TOUCH ULTRA TEST) test strip  PHARMACY:  [facility]  IS THIS A 90 DAY SUPPLY :   IS PATIENT OUT OF MEDICATION: Yes  IF NOT; HOW MUCH IS LEFT:   LAST APPOINTMENT DATE: @4 /29/2020  NEXT APPOINTMENT DATE:@Visit  date not found  DO WE HAVE YOUR PERMISSION TO LEAVE A DETAILED MESSAGE:  OTHER COMMENTS:  Patients son states that insurance will cover 25 strips and wants to know if they can get a larger quantity. States they will make him a follow up once all of this is over.  **Let patient know to contact pharmacy at the end of the day to make sure medication is ready. **  ** Please notify patient to allow 48-72 hours to process**  **Encourage patient to contact the pharmacy for refills or they can request refills through [facility]**

## 2019-03-18 ENCOUNTER — Other Ambulatory Visit: Payer: Self-pay

## 2019-03-18 ENCOUNTER — Other Ambulatory Visit: Payer: Self-pay | Admitting: Endocrinology

## 2019-03-18 ENCOUNTER — Ambulatory Visit (INDEPENDENT_AMBULATORY_CARE_PROVIDER_SITE_OTHER): Payer: Medicare Other

## 2019-03-18 DIAGNOSIS — N184 Chronic kidney disease, stage 4 (severe): Secondary | ICD-10-CM | POA: Diagnosis not present

## 2019-03-18 DIAGNOSIS — E0800 Diabetes mellitus due to underlying condition with hyperosmolarity without nonketotic hyperglycemic-hyperosmolar coma (NKHHC): Secondary | ICD-10-CM

## 2019-03-18 DIAGNOSIS — Z794 Long term (current) use of insulin: Secondary | ICD-10-CM

## 2019-03-18 DIAGNOSIS — E1122 Type 2 diabetes mellitus with diabetic chronic kidney disease: Secondary | ICD-10-CM | POA: Diagnosis not present

## 2019-03-18 LAB — POCT GLYCOSYLATED HEMOGLOBIN (HGB A1C): Hemoglobin A1C: 6.3 % — AB (ref 4.0–5.6)

## 2019-03-18 MED ORDER — REPAGLINIDE 0.5 MG PO TABS: 0.5000 mg | ORAL_TABLET | Freq: Two times a day (BID) | ORAL | 3 refills | Status: AC

## 2019-03-18 NOTE — Progress Notes (Signed)
Patient here for A1c.

## 2019-03-22 ENCOUNTER — Ambulatory Visit: Payer: Medicare Other | Admitting: Internal Medicine

## 2019-03-22 ENCOUNTER — Telehealth: Payer: Self-pay | Admitting: Endocrinology

## 2019-03-22 NOTE — Telephone Encounter (Signed)
Son returned call. States he does not agree with stopping the medication. States his father is a diabetic and MUST remain on something. States his biggest concern is that his father eats a slice of toast with jelly in the morning, and maybe 1/2 a sandwich at lunch. Further added that supper is his largest meal of the day. Asked son if he would prefer his father take the medication with supper. States this is our profession and we should advise accordingly. Reminded of the following:  1.  Original order was to reduce repaglanide to BID which he did not agree with.  2.  Followed up with his concern with orders to stop repaglanide.  Asked that he advise how he would prefer to best treat his father and I would be happy to pass his suggestion along to Dr. Loanne Drilling. Son became irritated and asked me to have a nice day. Will pass information along to Dr. Loanne Drilling to determine what order is most appropriate.

## 2019-03-22 NOTE — Telephone Encounter (Signed)
Please advise if this is a reduction to 2 tablets at supper

## 2019-03-22 NOTE — Telephone Encounter (Signed)
Please reduce repaglinide to supper only.

## 2019-03-22 NOTE — Telephone Encounter (Signed)
Patients son called in regards to his dad reducing medication, states his dad does not have very good memory. Provided Louie Casa Dr.Ellisons message.  He is just concerned that his dad does not eat a very big breakfast or lunch.  Please Advise with son, Thanks  Ph # [phone] Louie Casa)

## 2019-03-22 NOTE — Telephone Encounter (Signed)
Please advise 

## 2019-03-22 NOTE — Telephone Encounter (Signed)
Called son as requested. Left detailed VM informing of new orders. Asked that he return my call if he had additional questions or concerns.

## 2019-03-22 NOTE — Telephone Encounter (Signed)
Ok, please d/c repaglinide.

## 2019-03-22 NOTE — Telephone Encounter (Signed)
1 pill with supper (0.5 mg).

## 2019-03-23 NOTE — Telephone Encounter (Signed)
Called son and made him aware of new orders. Verbalized acceptance and understanding.

## 2019-03-28 ENCOUNTER — Other Ambulatory Visit: Payer: Self-pay | Admitting: Endocrinology

## 2019-03-29 ENCOUNTER — Telehealth: Payer: Self-pay | Admitting: Endocrinology

## 2019-03-29 ENCOUNTER — Other Ambulatory Visit: Payer: Self-pay

## 2019-03-29 DIAGNOSIS — E0800 Diabetes mellitus due to underlying condition with hyperosmolarity without nonketotic hyperglycemic-hyperosmolar coma (NKHHC): Secondary | ICD-10-CM

## 2019-03-29 MED ORDER — ONETOUCH ULTRA 2 W/DEVICE KIT: 1.0000 | PACK | Freq: Two times a day (BID) | 0 refills | Status: DC

## 2019-03-29 NOTE — Telephone Encounter (Signed)
Blood Glucose Monitoring Suppl (ONE TOUCH ULTRA 2) w/Device KIT 1 each 0 03/29/2019    Sig - Route: 1 each by Does not apply route 2 (two) times a day. Use to monitor glucose levels BID; E11.9; NPI [id number] - Does not apply   Sent to pharmacy as: Blood Glucose Monitoring Suppl (ONE TOUCH ULTRA 2) w/Device Kit   E-Prescribing Status: Receipt confirmed by pharmacy (03/29/2019 12:46 PM EDT)

## 2019-03-29 NOTE — Telephone Encounter (Signed)
Please forward refill request to pt's new primary care provider.  

## 2019-03-29 NOTE — Telephone Encounter (Signed)
Per son Matin Mattioli, patients meter is broken and they need to get a new one.  Wanted to know if we had any or they needed an RX for one.  Please let them know how to proceed - [phone] Louie Casa)

## 2019-04-01 ENCOUNTER — Telehealth: Payer: Self-pay | Admitting: Endocrinology

## 2019-04-01 ENCOUNTER — Other Ambulatory Visit: Payer: Self-pay | Admitting: Endocrinology

## 2019-04-01 NOTE — Telephone Encounter (Signed)
Please forward refill request to pt's new primary care provider.  

## 2019-04-01 NOTE — Telephone Encounter (Signed)
Per [facility], "Caller states he requested a refill for town of his father's medications, pharmacy still does not have them. Sees Dr. Loanne Drilling, needs atorvastatin 40 mg and donezapril, and a new glucose meter. [place] [address]."  Placed in doctors box.

## 2019-04-01 NOTE — Telephone Encounter (Signed)
Called pt and informed Dr. Loanne Drilling no longer manages Aricept and atorvastatin. Advised to call [name] for refills. Also advised Rx for meter has already been sent and advised to follow up with pharmacy re: status of refill. Verbalized acceptance and understanding.  Blood Glucose Monitoring Suppl (ONE TOUCH ULTRA 2) w/Device KIT 1 each 0 03/29/2019    Sig - Route: 1 each by Does not apply route 2 (two) times a day. Use to monitor glucose levels BID; E11.9; NPI [id number] - Does not apply   Sent to pharmacy as: Blood Glucose Monitoring Suppl (ONE TOUCH ULTRA 2) w/Device Kit   E-Prescribing Status: Receipt confirmed by pharmacy (03/29/2019 12:46 PM EDT)

## 2019-04-02 ENCOUNTER — Other Ambulatory Visit: Payer: Self-pay | Admitting: Endocrinology

## 2019-04-04 NOTE — Telephone Encounter (Signed)
Please forward refill request to pt's new primary care provider.  

## 2019-04-07 ENCOUNTER — Telehealth: Payer: Self-pay | Admitting: Internal Medicine

## 2019-04-07 NOTE — Telephone Encounter (Signed)
Copied from [place] [id number]. Topic: Quick Communication - Home Health Verbal Orders >> Apr 07, 2019 11:29 AM Carolyn Stare wrote: Caller/Agency April with [facility] call to say pt had a fall and has  a skin tear right for arm  She is requesting orders to treat it with xeroform 2 times a week   Callback Number [phone]

## 2019-04-07 NOTE — Telephone Encounter (Signed)
Gave ok for orders per Dr. Burns.  

## 2019-04-09 ENCOUNTER — Other Ambulatory Visit: Payer: Self-pay | Admitting: Endocrinology

## 2019-04-09 NOTE — Telephone Encounter (Signed)
Hi [name],  Dr. Loanne Drilling states he no longer manages these medications and asked me to forward this request to you. Pt and his family have requested refills from Dr. Loanne Drilling multiple times. Please review and refill if appropriate. Thank you.

## 2019-04-23 ENCOUNTER — Telehealth: Payer: Self-pay | Admitting: *Deleted

## 2019-04-23 NOTE — Telephone Encounter (Signed)
Spoke with son Chad Mcmillan who will be there ata visit and consented to IF USING DOXIMITY or DOXY.ME - The patient will receive a link just prior to their visit by text.   Cnfirm consent - "In the setting of the current Covid19 crisis, you are scheduled for a (phone or video) visit with your provider on (date) at (time).  Just as we do with many in-office visits, in order for you to participate in this visit, we must obtain consent.  If you'd like, I can send this to your mychart (if signed up) or email for you to review.  Otherwise, I can obtain your verbal consent now.  All virtual visits are billed to your insurance company just like a normal visit would be.  By agreeing to a virtual visit, we'd like you to understand that the technology does not allow for your provider to perform an examination, and thus may limit your provider's ability to fully assess your condition. If your provider identifies any concerns that need to be evaluated in person, we will make arrangements to do so.  Finally, though the technology is pretty good, we cannot assure that it will always work on either your or our end, and in the setting of a video visit, we may have to convert it to a phone-only visit.  In either situation, we cannot ensure that we have a secure connection.  Are you willing to proceed?" STAFF: Did the patient verbally acknowledge consent to telehealth visit? Document YES/NO here: YES     TELEPHONE CALL NOTE  Chad Mcmillan has been deemed a candidate for a follow-up tele-health visit to limit community exposure during the Covid-19 pandemic. I spoke with the patient via phone to ensure availability of phone/video source, confirm preferred email & phone number, and discuss instructions and expectations.  I reminded Chad Mcmillan to be prepared with any vital sign and/or heart rhythm information that could potentially be obtained via home monitoring, at the time of his visit. I reminded Chad Mcmillan to expect a phone call  prior to his visit.  Claude Manges, [place] 04/23/2019 11:38 AM   FULL LENGTH CONSENT FOR TELE-HEALTH VISIT   I hereby voluntarily request, consent and authorize [facility] and its employed or contracted physicians, physician assistants, nurse practitioners or other licensed health care professionals (the Practitioner), to provide me with telemedicine health care services (the "Services") as deemed necessary by the treating Practitioner. I acknowledge and consent to receive the Services by the Practitioner via telemedicine. I understand that the telemedicine visit will involve communicating with the Practitioner through live audiovisual communication technology and the disclosure of certain medical information by electronic transmission. I acknowledge that I have been given the opportunity to request an in-person assessment or other available alternative prior to the telemedicine visit and am voluntarily participating in the telemedicine visit.  I understand that I have the right to withhold or withdraw my consent to the use of telemedicine in the course of my care at any time, without affecting my right to future care or treatment, and that the Practitioner or I may terminate the telemedicine visit at any time. I understand that I have the right to inspect all information obtained and/or recorded in the course of the telemedicine visit and may receive copies of available information for a reasonable fee.  I understand that some of the potential risks of receiving the Services via telemedicine include:  Marland Kitchen Delay or interruption in medical evaluation due to technological  equipment failure or disruption; . Information transmitted may not be sufficient (e.g. poor resolution of images) to allow for appropriate medical decision making by the Practitioner; and/or  . In rare instances, security protocols could fail, causing a breach of personal health information.  Furthermore, I acknowledge that it is my  responsibility to provide information about my medical history, conditions and care that is complete and accurate to the best of my ability. I acknowledge that Practitioner's advice, recommendations, and/or decision may be based on factors not within their control, such as incomplete or inaccurate data provided by me or distortions of diagnostic images or specimens that may result from electronic transmissions. I understand that the practice of medicine is not an exact science and that Practitioner makes no warranties or guarantees regarding treatment outcomes. I acknowledge that I will receive a copy of this consent concurrently upon execution via email to the email address I last provided but may also request a printed copy by calling the office of [place].    I understand that my insurance will be billed for this visit.   I have read or had this consent read to me. . I understand the contents of this consent, which adequately explains the benefits and risks of the Services being provided via telemedicine.  . I have been provided ample opportunity to ask questions regarding this consent and the Services and have had my questions answered to my satisfaction. . I give my informed consent for the services to be provided through the use of telemedicine in my medical care  By participating in this telemedicine visit I agree to the above.

## 2019-04-25 ENCOUNTER — Inpatient Hospital Stay (HOSPITAL_COMMUNITY)
Admission: EM | Admit: 2019-04-25 | Discharge: 2019-04-28 | DRG: 291 | Disposition: A | Discharge status: Skilled Nursing Facility | Payer: Medicare Other | Attending: Internal Medicine | Admitting: Internal Medicine

## 2019-04-25 ENCOUNTER — Emergency Department (HOSPITAL_COMMUNITY): Payer: Medicare Other

## 2019-04-25 ENCOUNTER — Other Ambulatory Visit: Payer: Self-pay

## 2019-04-25 ENCOUNTER — Encounter (HOSPITAL_COMMUNITY): Payer: Self-pay | Admitting: Emergency Medicine

## 2019-04-25 DIAGNOSIS — R55 Syncope and collapse: Secondary | ICD-10-CM | POA: Diagnosis not present

## 2019-04-25 DIAGNOSIS — Z87891 Personal history of nicotine dependence: Secondary | ICD-10-CM

## 2019-04-25 DIAGNOSIS — Z85828 Personal history of other malignant neoplasm of skin: Secondary | ICD-10-CM

## 2019-04-25 DIAGNOSIS — R918 Other nonspecific abnormal finding of lung field: Secondary | ICD-10-CM | POA: Diagnosis present

## 2019-04-25 DIAGNOSIS — Z515 Encounter for palliative care: Secondary | ICD-10-CM

## 2019-04-25 DIAGNOSIS — I480 Paroxysmal atrial fibrillation: Secondary | ICD-10-CM | POA: Diagnosis present

## 2019-04-25 DIAGNOSIS — D61818 Other pancytopenia: Secondary | ICD-10-CM | POA: Diagnosis present

## 2019-04-25 DIAGNOSIS — I5043 Acute on chronic combined systolic (congestive) and diastolic (congestive) heart failure: Secondary | ICD-10-CM | POA: Diagnosis not present

## 2019-04-25 DIAGNOSIS — Z8673 Personal history of transient ischemic attack (TIA), and cerebral infarction without residual deficits: Secondary | ICD-10-CM

## 2019-04-25 DIAGNOSIS — I10 Essential (primary) hypertension: Secondary | ICD-10-CM | POA: Diagnosis present

## 2019-04-25 DIAGNOSIS — D649 Anemia, unspecified: Secondary | ICD-10-CM | POA: Diagnosis present

## 2019-04-25 DIAGNOSIS — E1122 Type 2 diabetes mellitus with diabetic chronic kidney disease: Secondary | ICD-10-CM | POA: Diagnosis present

## 2019-04-25 DIAGNOSIS — Z9049 Acquired absence of other specified parts of digestive tract: Secondary | ICD-10-CM

## 2019-04-25 DIAGNOSIS — R29898 Other symptoms and signs involving the musculoskeletal system: Secondary | ICD-10-CM | POA: Diagnosis present

## 2019-04-25 DIAGNOSIS — Z20828 Contact with and (suspected) exposure to other viral communicable diseases: Secondary | ICD-10-CM | POA: Diagnosis present

## 2019-04-25 DIAGNOSIS — N184 Chronic kidney disease, stage 4 (severe): Secondary | ICD-10-CM | POA: Diagnosis not present

## 2019-04-25 DIAGNOSIS — I130 Hypertensive heart and chronic kidney disease with heart failure and stage 1 through stage 4 chronic kidney disease, or unspecified chronic kidney disease: Principal | ICD-10-CM | POA: Diagnosis present

## 2019-04-25 DIAGNOSIS — Z79899 Other long term (current) drug therapy: Secondary | ICD-10-CM

## 2019-04-25 DIAGNOSIS — E1121 Type 2 diabetes mellitus with diabetic nephropathy: Secondary | ICD-10-CM | POA: Diagnosis present

## 2019-04-25 DIAGNOSIS — E11319 Type 2 diabetes mellitus with unspecified diabetic retinopathy without macular edema: Secondary | ICD-10-CM | POA: Diagnosis present

## 2019-04-25 DIAGNOSIS — Z8546 Personal history of malignant neoplasm of prostate: Secondary | ICD-10-CM

## 2019-04-25 DIAGNOSIS — R531 Weakness: Secondary | ICD-10-CM

## 2019-04-25 DIAGNOSIS — F0390 Unspecified dementia without behavioral disturbance: Secondary | ICD-10-CM | POA: Diagnosis present

## 2019-04-25 DIAGNOSIS — J9601 Acute respiratory failure with hypoxia: Secondary | ICD-10-CM | POA: Diagnosis present

## 2019-04-25 DIAGNOSIS — Z9849 Cataract extraction status, unspecified eye: Secondary | ICD-10-CM

## 2019-04-25 DIAGNOSIS — I2510 Atherosclerotic heart disease of native coronary artery without angina pectoris: Secondary | ICD-10-CM | POA: Diagnosis present

## 2019-04-25 DIAGNOSIS — I951 Orthostatic hypotension: Secondary | ICD-10-CM | POA: Diagnosis present

## 2019-04-25 DIAGNOSIS — Z7189 Other specified counseling: Secondary | ICD-10-CM

## 2019-04-25 DIAGNOSIS — K7581 Nonalcoholic steatohepatitis (NASH): Secondary | ICD-10-CM | POA: Diagnosis present

## 2019-04-25 DIAGNOSIS — E785 Hyperlipidemia, unspecified: Secondary | ICD-10-CM | POA: Diagnosis present

## 2019-04-25 DIAGNOSIS — Z7982 Long term (current) use of aspirin: Secondary | ICD-10-CM

## 2019-04-25 DIAGNOSIS — D631 Anemia in chronic kidney disease: Secondary | ICD-10-CM | POA: Diagnosis present

## 2019-04-25 LAB — CBC WITH DIFFERENTIAL/PLATELET
Abs Immature Granulocytes: 0.02 10*3/uL (ref 0.00–0.07)
Basophils Absolute: 0 10*3/uL (ref 0.0–0.1)
Basophils Relative: 0 %
Eosinophils Absolute: 0 10*3/uL (ref 0.0–0.5)
Eosinophils Relative: 1 %
HCT: 27.2 % — ABNORMAL LOW (ref 39.0–52.0)
Hemoglobin: 8.3 g/dL — ABNORMAL LOW (ref 13.0–17.0)
Immature Granulocytes: 0 %
Lymphocytes Relative: 12 %
Lymphs Abs: 0.7 10*3/uL (ref 0.7–4.0)
MCH: 31.2 pg (ref 26.0–34.0)
MCHC: 30.5 g/dL (ref 30.0–36.0)
MCV: 102.3 fL — ABNORMAL HIGH (ref 80.0–100.0)
Monocytes Absolute: 0.4 10*3/uL (ref 0.1–1.0)
Monocytes Relative: 8 %
Neutro Abs: 4.4 10*3/uL (ref 1.7–7.7)
Neutrophils Relative %: 79 %
Platelets: 106 10*3/uL — ABNORMAL LOW (ref 150–400)
RBC: 2.66 MIL/uL — ABNORMAL LOW (ref 4.22–5.81)
RDW: 14 % (ref 11.5–15.5)
WBC: 5.6 10*3/uL (ref 4.0–10.5)
nRBC: 0 % (ref 0.0–0.2)

## 2019-04-25 LAB — URINALYSIS, ROUTINE W REFLEX MICROSCOPIC
Bilirubin Urine: NEGATIVE
Glucose, UA: 50 mg/dL — AB
Hgb urine dipstick: NEGATIVE
Ketones, ur: NEGATIVE mg/dL
Leukocytes,Ua: NEGATIVE
Nitrite: NEGATIVE
Protein, ur: >=300 mg/dL — AB
Specific Gravity, Urine: 1.013 (ref 1.005–1.030)
pH: 5 (ref 5.0–8.0)

## 2019-04-25 LAB — COMPREHENSIVE METABOLIC PANEL
ALT: 22 U/L (ref 0–44)
AST: 27 U/L (ref 15–41)
Albumin: 2.7 g/dL — ABNORMAL LOW (ref 3.5–5.0)
Alkaline Phosphatase: 123 U/L (ref 38–126)
Anion gap: 10 (ref 5–15)
BUN: 61 mg/dL — ABNORMAL HIGH (ref 8–23)
CO2: 29 mmol/L (ref 22–32)
Calcium: 8.6 mg/dL — ABNORMAL LOW (ref 8.9–10.3)
Chloride: 104 mmol/L (ref 98–111)
Creatinine, Ser: 2.5 mg/dL — ABNORMAL HIGH (ref 0.61–1.24)
GFR calc Af Amer: 25 mL/min — ABNORMAL LOW (ref >60)
GFR calc non Af Amer: 21 mL/min — ABNORMAL LOW (ref >60)
Glucose, Bld: 165 mg/dL — ABNORMAL HIGH (ref 70–99)
Potassium: 4.1 mmol/L (ref 3.5–5.1)
Sodium: 143 mmol/L (ref 135–145)
Total Bilirubin: 1 mg/dL (ref 0.3–1.2)
Total Protein: 6.5 g/dL (ref 6.5–8.1)

## 2019-04-25 LAB — BRAIN NATRIURETIC PEPTIDE: B Natriuretic Peptide: 2964.3 pg/mL — ABNORMAL HIGH (ref 0.0–100.0)

## 2019-04-25 LAB — GLUCOSE, CAPILLARY
Glucose-Capillary: 147 mg/dL — ABNORMAL HIGH (ref 70–99)
Glucose-Capillary: 224 mg/dL — ABNORMAL HIGH (ref 70–99)

## 2019-04-25 LAB — CBG MONITORING, ED: Glucose-Capillary: 134 mg/dL — ABNORMAL HIGH (ref 70–99)

## 2019-04-25 LAB — LACTATE DEHYDROGENASE: LDH: 196 U/L — ABNORMAL HIGH (ref 98–192)

## 2019-04-25 LAB — TRIGLYCERIDES: Triglycerides: 86 mg/dL (ref <150)

## 2019-04-25 LAB — FERRITIN: Ferritin: 464 ng/mL — ABNORMAL HIGH (ref 24–336)

## 2019-04-25 LAB — PROCALCITONIN: Procalcitonin: 0.16 ng/mL

## 2019-04-25 LAB — LACTIC ACID, PLASMA
Lactic Acid, Venous: 0.8 mmol/L (ref 0.5–1.9)
Lactic Acid, Venous: 1.1 mmol/L (ref 0.5–1.9)

## 2019-04-25 LAB — HEMOGLOBIN A1C
Hgb A1c MFr Bld: 6.7 % — ABNORMAL HIGH (ref 4.8–5.6)
Mean Plasma Glucose: 145.59 mg/dL

## 2019-04-25 LAB — FIBRINOGEN: Fibrinogen: 564 mg/dL — ABNORMAL HIGH (ref 210–475)

## 2019-04-25 LAB — D-DIMER, QUANTITATIVE: D-Dimer, Quant: 4.77 ug/mL-FEU — ABNORMAL HIGH (ref 0.00–0.50)

## 2019-04-25 LAB — C-REACTIVE PROTEIN: CRP: 2.8 mg/dL — ABNORMAL HIGH (ref <1.0)

## 2019-04-25 LAB — SARS CORONAVIRUS 2 BY RT PCR (HOSPITAL ORDER, PERFORMED IN ~~LOC~~ HOSPITAL LAB): SARS Coronavirus 2: NEGATIVE

## 2019-04-25 MED ORDER — FUROSEMIDE 10 MG/ML IJ SOLN
60.0000 mg | Freq: Once | INTRAMUSCULAR | Status: AC
  Administered 2019-04-25: 15:00:00 60 mg via INTRAVENOUS
  Filled 2019-04-25: qty 6

## 2019-04-25 MED ORDER — ACETAMINOPHEN 325 MG PO TABS: 650.0000 mg | ORAL_TABLET | ORAL | Status: DC | PRN

## 2019-04-25 MED ORDER — POLYETHYLENE GLYCOL 3350 17 G PO PACK
17.0000 g | PACK | Freq: Every day | ORAL | Status: DC
  Filled 2019-04-25 (×3): qty 1

## 2019-04-25 MED ORDER — FERROUS GLUCONATE 324 (38 FE) MG PO TABS
324.0000 mg | ORAL_TABLET | Freq: Every day | ORAL | Status: DC
  Administered 2019-04-25 – 2019-04-28 (×4): 324 mg via ORAL
  Filled 2019-04-25 (×4): qty 1

## 2019-04-25 MED ORDER — HEPARIN SODIUM (PORCINE) 5000 UNIT/ML IJ SOLN
5000.0000 [IU] | Freq: Three times a day (TID) | INTRAMUSCULAR | Status: DC
  Administered 2019-04-25 – 2019-04-28 (×9): 5000 [IU] via SUBCUTANEOUS
  Filled 2019-04-25 (×9): qty 1

## 2019-04-25 MED ORDER — REPAGLINIDE 1 MG PO TABS
0.5000 mg | ORAL_TABLET | Freq: Two times a day (BID) | ORAL | Status: DC
  Administered 2019-04-25 – 2019-04-28 (×7): 0.5 mg via ORAL
  Filled 2019-04-25 (×8): qty 0.5

## 2019-04-25 MED ORDER — SODIUM CHLORIDE 0.9% FLUSH
3.0000 mL | Freq: Two times a day (BID) | INTRAVENOUS | Status: DC
  Administered 2019-04-25 – 2019-04-28 (×7): 3 mL via INTRAVENOUS

## 2019-04-25 MED ORDER — ISOSORB DINITRATE-HYDRALAZINE 20-37.5 MG PO TABS
1.0000 | ORAL_TABLET | Freq: Three times a day (TID) | ORAL | Status: DC
  Administered 2019-04-25 – 2019-04-28 (×10): 1 via ORAL
  Filled 2019-04-25 (×10): qty 1

## 2019-04-25 MED ORDER — DONEPEZIL HCL 5 MG PO TABS
5.0000 mg | ORAL_TABLET | Freq: Every day | ORAL | Status: DC
  Administered 2019-04-25 – 2019-04-27 (×3): 5 mg via ORAL
  Filled 2019-04-25 (×3): qty 1

## 2019-04-25 MED ORDER — NITROGLYCERIN 0.4 MG SL SUBL: 0.4000 mg | SUBLINGUAL_TABLET | SUBLINGUAL | Status: DC | PRN

## 2019-04-25 MED ORDER — SENNOSIDES-DOCUSATE SODIUM 8.6-50 MG PO TABS: 1.0000 | ORAL_TABLET | Freq: Every evening | ORAL | Status: DC | PRN

## 2019-04-25 MED ORDER — INSULIN ASPART 100 UNIT/ML ~~LOC~~ SOLN
0.0000 [IU] | Freq: Three times a day (TID) | SUBCUTANEOUS | Status: DC
  Administered 2019-04-25: 2 [IU] via SUBCUTANEOUS
  Administered 2019-04-26: 8 [IU] via SUBCUTANEOUS
  Administered 2019-04-26 (×2): 2 [IU] via SUBCUTANEOUS
  Administered 2019-04-27 – 2019-04-28 (×2): 5 [IU] via SUBCUTANEOUS

## 2019-04-25 MED ORDER — FUROSEMIDE 10 MG/ML IJ SOLN
60.0000 mg | Freq: Two times a day (BID) | INTRAMUSCULAR | Status: DC
  Administered 2019-04-25 – 2019-04-28 (×6): 60 mg via INTRAVENOUS
  Filled 2019-04-25 (×6): qty 6

## 2019-04-25 MED ORDER — ATORVASTATIN CALCIUM 80 MG PO TABS
80.0000 mg | ORAL_TABLET | Freq: Every day | ORAL | Status: DC
  Administered 2019-04-25 – 2019-04-27 (×3): 80 mg via ORAL
  Filled 2019-04-25 (×3): qty 1

## 2019-04-25 MED ORDER — POTASSIUM CHLORIDE CRYS ER 20 MEQ PO TBCR
20.0000 meq | EXTENDED_RELEASE_TABLET | Freq: Every day | ORAL | Status: DC
  Administered 2019-04-25 – 2019-04-27 (×3): 20 meq via ORAL
  Filled 2019-04-25 (×4): qty 1

## 2019-04-25 MED ORDER — SODIUM CHLORIDE 0.9 % IV SOLN: 250.0000 mL | INTRAVENOUS | Status: DC | PRN

## 2019-04-25 MED ORDER — SODIUM CHLORIDE 0.9% FLUSH: 3.0000 mL | INTRAVENOUS | Status: DC | PRN

## 2019-04-25 MED ORDER — ONDANSETRON HCL 4 MG/2ML IJ SOLN: 4.0000 mg | Freq: Four times a day (QID) | INTRAMUSCULAR | Status: DC | PRN

## 2019-04-25 MED ORDER — ASPIRIN EC 81 MG PO TBEC
81.0000 mg | DELAYED_RELEASE_TABLET | Freq: Every day | ORAL | Status: DC
  Administered 2019-04-25 – 2019-04-28 (×4): 81 mg via ORAL
  Filled 2019-04-25 (×4): qty 1

## 2019-04-25 MED ORDER — VITAMIN B-12 1000 MCG PO TABS
1000.0000 ug | ORAL_TABLET | Freq: Every day | ORAL | Status: DC
  Administered 2019-04-25 – 2019-04-28 (×4): 1000 ug via ORAL
  Filled 2019-04-25 (×4): qty 1

## 2019-04-25 MED ORDER — LORAZEPAM 0.5 MG PO TABS: 0.5000 mg | ORAL_TABLET | Freq: Every evening | ORAL | Status: DC | PRN

## 2019-04-25 MED ORDER — METOPROLOL TARTRATE 25 MG PO TABS
25.0000 mg | ORAL_TABLET | Freq: Two times a day (BID) | ORAL | Status: DC
  Administered 2019-04-25 – 2019-04-28 (×6): 25 mg via ORAL
  Filled 2019-04-25 (×6): qty 1

## 2019-04-25 NOTE — ED Notes (Signed)
Spoke with son Adyen Bifulco [phone] about pts plan of care. Son appreciative of call.

## 2019-04-25 NOTE — H&P (Signed)
History and Physical    Chad Mcmillan NLG:[id number] DOB: [birth year] DOA: 04/25/2019  PCP: Binnie Rail, MD  Patient coming from: Home  I have personally briefly reviewed patient's old medical records in [place]  Chief Complaint: Weakness and shortness of breath  HPI: Chad Mcmillan is a 85 y.o. male with medical history significant of diabetes type 2, stroke, coronary artery disease, congestive heart failure with ejection fraction of 25 to 30% also with diastolic dysfunction, diabetes with nephropathy and retinopathy who presents to the emergency department complaining of increasing weakness and shortness of breath.  Symptoms may have been subacute in terms of his weakness over the past 6 months the patient states he is had difficulty walking.  He told me that when he gets up to walk his oxygen levels drop and he just feels so weak.  Apparently at approximately 2 AM he got up to go to the bathroom he felt weak and lowered himself to the ground.  His wife was present but she has some mild dementia took her a couple of hours to help him get up.  Patient and wife have 2 sons who are very involved ultimately a decision was made to call EMS.  The emergency department he was fully evaluated and attempt to ambulate the patient resulted in desaturations to 86% and he was very unsteady.  He was referred to me for further evaluation and management.  Patient does not report any change in his weight.  He states he does not have edema (he clearly does on physical exam) not sure how useful his history is he may have a component of dementia.  Patient states that he is very comfortable at rest but when he gets up to walk he gets very short of breath and cannot walk more than 20 feet.  ED Course: Hypoxic with mild ambulation unable to walk referred to me for further evaluation  Review of Systems: As per HPI otherwise all other systems reviewed and  negative.   Past Medical History:  Diagnosis Date    ADENOCARCINOMA, PROSTATE 06/01/2008   CORONARY ARTERY DISEASE 06/13/2007   CVA 06/01/2008   x 2   DIABETES MELLITUS, TYPE I, WITH OPHTHALMIC COMPLICATIONS 0/81/4481   DM retinopathy ([place])    HYPERKALEMIA 06/01/2008   due to DM neparopathy   HYPERLIPIDEMIA 06/13/2007   HYPERTENSION 06/13/2007   NEOPLASM, MALIGNANT, SKIN, FACE 06/01/2008   Orthostatic hypotension 12/19/2018   Other chronic nonalcoholic liver disease 8/56/3149   RENAL DISEASE 06/01/2008    Past Surgical History:  Procedure Laterality Date   Carotid Duplex  09/12/2004   CATARACT EXTRACTION     CHOLECYSTECTOMY     ESOPHAGOGASTRODUODENOSCOPY  06/28/1991   FEMUR IM NAIL Right 07/17/2018   HERNIA REPAIR     right Inguinal hernia   INTRAMEDULLARY (IM) NAIL INTERTROCHANTERIC N/A 07/17/2018   Procedure: INTRAMEDULLARY (IM) NAIL INTERTROCHANTRIC;  Surgeon: Leandrew Koyanagi, MD;  Location: [facility];  Service: Orthopedics;  Laterality: N/A;   Rest/Stress Cardiolite  01/22/2001    Social History   Social History Narrative   Lives with wife who also has dementia 2 sons very inviolved     reports that he quit smoking about 40 years ago. He has never used smokeless tobacco. He reports that he does not drink alcohol or use drugs.  No Known Allergies  Family History  Problem Relation Age of Onset   Vision loss Son      Prior to Admission medications  Medication Sig Start Date End Date Taking? Authorizing Provider  aspirin EC 81 MG tablet Take 1 tablet (81 mg total) by mouth daily. 08/04/17  Yes [name], MD  atorvastatin (LIPITOR) 40 MG tablet TAKE 2 TABLETS BY MOUTH ONCE DAILY AT 6 IN THE EVENING Patient taking differently: Take 80 mg by mouth daily at 6 PM.  04/09/19  Yes Burns, Claudina Lick, MD  cyanocobalamin 1000 MCG tablet Take 1,000 mcg by mouth daily.   Yes [provider]  donepezil (ARICEPT) 5 MG tablet TAKE 1 TABLET BY MOUTH AT BEDTIME Patient taking differently: Take 5 mg by mouth at bedtime.   04/09/19  Yes Burns, Claudina Lick, MD  Ferrous Gluconate 324 (37.5 Fe) MG TABS Take 324 mg by mouth daily.    Yes [provider]  furosemide (LASIX) 40 MG tablet Take 40 mg by mouth every other day.    Yes [provider]  isosorbide-hydrALAZINE (BIDIL) 20-37.5 MG tablet Take 1 tablet by mouth 3 (three) times daily. 01/25/19  Yes [name], MD  LORazepam (ATIVAN) 0.5 MG tablet Take 1 tablet (0.5 mg total) by mouth at bedtime as needed for anxiety. 12/02/18  Yes Burns, Claudina Lick, MD  meclizine (ANTIVERT) 12.5 MG tablet TAKE 1 TABLET BY MOUTH THREE TIMES DAILY AS NEEDED FOR DIZZINESS Patient taking differently: Take 12.5 mg by mouth 3 (three) times daily as needed for dizziness.  06/30/18  Yes [name], MD  metoprolol tartrate (LOPRESSOR) 25 MG tablet Take 1 tablet (25 mg total) by mouth 2 (two) times daily. 01/25/19  Yes [name], MD  nitroGLYCERIN (NITROSTAT) 0.4 MG SL tablet Place 1 tablet (0.4 mg total) under the tongue every 5 (five) minutes as needed. For chest pain 08/04/17  Yes [name], MD  polyethylene glycol [facility] / GLYCOLAX) packet Take 17 g by mouth daily. 07/25/18  Yes Hongalgi, Lenis Dickinson, MD  repaglinide (PRANDIN) 0.5 MG tablet Take 1 tablet (0.5 mg total) by mouth 2 (two) times daily before a meal. 03/18/19  Yes [name], MD  senna-docusate (SENOKOT-S) 8.6-50 MG tablet Take 1 tablet by mouth at bedtime as needed for mild constipation or moderate constipation. 07/24/18  Yes Hongalgi, Lenis Dickinson, MD  Blood Glucose Monitoring Suppl (ONE TOUCH ULTRA 2) w/Device KIT 1 each by Does not apply route 2 (two) times a day. Use to monitor glucose levels BID; E11.9; NPI [id number] 03/29/19   [name], MD  glucose blood (ONE TOUCH ULTRA TEST) test strip Use 1 test strip to monitor glucose levels BID; E11.9; NPI [id number] 03/17/19   [name], MD  OneTouch Delica Lancets 32I MISC Use 1 lancet to monitor glucose levels BID; E11.9; NPI [id number] 03/17/19   [name], MD     Physical Exam:  Constitutional: NAD, calm, comfortable Vitals:   04/25/19 1100 04/25/19 1200 04/25/19 1315 04/25/19 1330  BP:    (!) 147/82  Pulse: (!) 58 (!) 59 (!) 54 (!) 52  Resp: 18 20 15 13   Temp:      TempSrc:      SpO2: 100% 100% 100% 100%   Eyes: PERRL, lids and conjunctivae normal ENMT: Mucous membranes are moist. Posterior pharynx clear of any exudate or lesions.Normal dentition.  Neck: normal, supple, no masses, no thyromegaly Respiratory: Creased breath sounds at the bases bilaterally, mild basilar crackles normal respiratory effort. No accessory muscle use.  Cardiovascular: Regular rate and rhythm, no murmurs / rubs / gallops. extremity edema 1+ pretibeal. 2+ pedal  pulses. No carotid bruits.  Mild hepatojugular reflux Abdomen: no tenderness, no masses palpated. No hepatosplenomegaly. Bowel sounds positive.  Musculoskeletal: no clubbing / cyanosis. No joint deformity upper and lower extremities. Good ROM, no contractures. Normal muscle tone.  Skin: no rashes, lesions, ulcers. No induration Neurologic: CN 2-12 grossly intact. Sensation intact, DTR normal. Strength 5/5 in all 4.  Psychiatric: Normal judgment and insight. Alert and oriented x 3. Normal mood.    Labs on Admission: I have personally reviewed following labs and imaging studies  CBC: Recent Labs  Lab 04/25/19 1019  WBC 5.6  NEUTROABS 4.4  HGB 8.3*  HCT 27.2*  MCV 102.3*  PLT 767*   Basic Metabolic Panel: Recent Labs  Lab 04/25/19 1019  NA 143  K 4.1  CL 104  CO2 29  GLUCOSE 165*  BUN 61*  CREATININE 2.50*  CALCIUM 8.6*   GFR: CrCl cannot be calculated (Unknown ideal weight.). Liver Function Tests: Recent Labs  Lab 04/25/19 1019  AST 27  ALT 22  ALKPHOS 123  BILITOT 1.0  PROT 6.5  ALBUMIN 2.7*   BNP (last 3 results) Recent Labs    12/02/18 1416 12/14/18 1415 12/29/18 1356  PROBNP 11,875* 2,511.0* 1,328.0*   CBG: Recent Labs  Lab 04/25/19 1009  GLUCAP 134*   Urine  analysis:    Component Value Date/Time   COLORURINE YELLOW 04/25/2019 [place] 04/25/2019 0951   LABSPEC 1.013 04/25/2019 0951   PHURINE 5.0 04/25/2019 0951   GLUCOSEU 50 (A) 04/25/2019 0951   GLUCOSEU NEGATIVE 09/07/2014 1334   HGBUR NEGATIVE 04/25/2019 0951   BILIRUBINUR NEGATIVE 04/25/2019 0951   KETONESUR NEGATIVE 04/25/2019 0951   PROTEINUR >=300 (A) 04/25/2019 0951   UROBILINOGEN 0.2 09/07/2014 1334   NITRITE NEGATIVE 04/25/2019 0951   LEUKOCYTESUR NEGATIVE 04/25/2019 0951    Radiological Exams on Admission: Ct Head Wo Contrast  Result Date: 04/25/2019 CLINICAL DATA:  Syncopal episode.  Multiple falls. EXAM: CT HEAD WITHOUT CONTRAST CT CERVICAL SPINE WITHOUT CONTRAST TECHNIQUE: Multidetector CT imaging of the head and cervical spine was performed following the standard protocol without intravenous contrast. Multiplanar CT image reconstructions of the cervical spine were also generated. COMPARISON:  Brain CT 07/17/2018. FINDINGS: CT HEAD FINDINGS Brain: Ventricles and sulci are prominent compatible with atrophy. Periventricular and subcortical white matter hypodensity compatible with chronic microvascular ischemic changes. No evidence for acute cortically based infarct, intracranial hemorrhage, mass lesion mass-effect. Vascular: Unremarkable Skull: Intact. Sinuses/Orbits: Paranasal sinuses are well aerated. Focal soft tissue and mucosal thickening within the right maxillary sinus (image 6; series 4). Orbits are unremarkable. Other: None. CT CERVICAL SPINE FINDINGS Alignment: Normal anatomic alignment. Skull base and vertebrae: No acute fracture. No primary bone lesion or focal pathologic process. Soft tissues and spinal canal: No prevertebral fluid or swelling. No visible canal hematoma. Disc levels: Degenerative disc disease most pronounced C3-4 and C6-7. No evidence for acute fracture. Upper chest: Unremarkable. Other: Bilateral carotid arterial vascular calcifications.  IMPRESSION: No acute intracranial process. Atrophy and chronic microvascular ischemic changes. No acute cervical spine fracture.  Degenerative disc disease. Soft tissue density material within the right maxillary sinus is nonspecific however may represent sequelae of chronic sinusitis. Recommend clinical correlation. Electronically Signed   By: Lovey Newcomer M.D.   On: 04/25/2019 11:34   Ct Chest Wo Contrast  Result Date: 04/25/2019 CLINICAL DATA:  Hypoxia plan standing.  Increased weakness. EXAM: CT CHEST WITHOUT CONTRAST TECHNIQUE: Multidetector CT imaging of the chest was performed following the standard  protocol without IV contrast. COMPARISON:  02/27/2018 and chest x-ray 04/25/2019 FINDINGS: Cardiovascular: Mild cardiomegaly. Calcified plaque over the left main and 3 vessel coronary arteries. Calcified plaque over the thoracic aorta. Remaining vascular structures are unremarkable. Mediastinum/Nodes: No significant mediastinal or hilar adenopathy. No axillary adenopathy. Remaining mediastinal structures are unremarkable. Lungs/Pleura: Lungs are adequately inflated as there has been interval resolution of the previously seen bilateral nodular airspace process, although there is persistence of masslike heterogeneous density over the left lower lobe measuring 4.2 x 5.9 cm. The medial half of this masslike density has Hounsfield unit measurements of fat density as the lateral aspect may be due in part to compressive atelectasis. Small to moderate bilateral pleural effusions left greater than right. Stable nodular scarring over the right middle lobe. Aspirate material over the dependent portion of the upper trachea. Upper Abdomen: No acute findings. Calcified plaque over the abdominal aorta. Musculoskeletal: Degenerative change of the spine. IMPRESSION: Mild to moderate size bilateral pleural effusions left greater than right. Persistent heterogeneous masslike opacity over the left lower lobe with areas of fat  density measuring 4.2 x 5.9 cm. This masslike opacity persists despite resolution of the previously seen diffuse bilateral nodular airspace process. This raises possibility frontal lying malignancy/adenocarcinoma. No evidence of adenopathy. Consider PET-CT and pulmonary/thoracic surgical consultation for further evaluation. Stable nodular scarring over the right middle lobe. Aspirate material over the dependent portion of the upper trachea. Mild cardiomegaly. Left main and 3 vessel atherosclerotic coronary artery disease. Aortic Atherosclerosis (ICD10-I70.0). Electronically Signed   By: Marin Olp M.D.   On: 04/25/2019 14:35   Ct Cervical Spine Wo Contrast  Result Date: 04/25/2019 CLINICAL DATA:  Syncopal episode.  Multiple falls. EXAM: CT HEAD WITHOUT CONTRAST CT CERVICAL SPINE WITHOUT CONTRAST TECHNIQUE: Multidetector CT imaging of the head and cervical spine was performed following the standard protocol without intravenous contrast. Multiplanar CT image reconstructions of the cervical spine were also generated. COMPARISON:  Brain CT 07/17/2018. FINDINGS: CT HEAD FINDINGS Brain: Ventricles and sulci are prominent compatible with atrophy. Periventricular and subcortical white matter hypodensity compatible with chronic microvascular ischemic changes. No evidence for acute cortically based infarct, intracranial hemorrhage, mass lesion mass-effect. Vascular: Unremarkable Skull: Intact. Sinuses/Orbits: Paranasal sinuses are well aerated. Focal soft tissue and mucosal thickening within the right maxillary sinus (image 6; series 4). Orbits are unremarkable. Other: None. CT CERVICAL SPINE FINDINGS Alignment: Normal anatomic alignment. Skull base and vertebrae: No acute fracture. No primary bone lesion or focal pathologic process. Soft tissues and spinal canal: No prevertebral fluid or swelling. No visible canal hematoma. Disc levels: Degenerative disc disease most pronounced C3-4 and C6-7. No evidence for acute  fracture. Upper chest: Unremarkable. Other: Bilateral carotid arterial vascular calcifications. IMPRESSION: No acute intracranial process. Atrophy and chronic microvascular ischemic changes. No acute cervical spine fracture.  Degenerative disc disease. Soft tissue density material within the right maxillary sinus is nonspecific however may represent sequelae of chronic sinusitis. Recommend clinical correlation. Electronically Signed   By: Lovey Newcomer M.D.   On: 04/25/2019 11:34   Dg Chest Portable 1 View  Result Date: 04/25/2019 CLINICAL DATA:  Syncopal episode. EXAM: PORTABLE CHEST 1 VIEW COMPARISON:  Chest radiograph 12/19/2018. FINDINGS: Monitoring leads overlie the patient. Stable cardiomegaly. Aortic atherosclerosis. Moderate bilateral pleural effusions with underlying bibasilar consolidation. No pneumothorax. IMPRESSION: Cardiomegaly, bilateral pleural effusions and underlying opacities which may represent atelectasis, edema and/or infection. Electronically Signed   By: Lovey Newcomer M.D.   On: 04/25/2019 10:37    EKG: Independently  reviewed.  Supraventricular bigeminy, prolonged PR interval,  left bundle branch block, PVCs not present when compared to prior tracing from 10/08/2019.  Echocardiogram 12/20/2018:  1. The left ventricle has severely reduced systolic function of 46-27%. The cavity size is mildly increased. There is mild left ventricular wall thickness. Echo evidence of impaired relaxation diastolic filling patterns. Indeterminent filling pressures.  2. Global hypokinesis.  3. Moderately dilated left atrial size.  4. Normal right atrial size.  5. The mitral valve normal in structure. Regurgitation is mild by color flow Doppler.  6. Normal tricuspid valve.  7. The aortic valve tricuspid. There is mild thickening and mild calcification of the aortic valve.  8. The ascending aorta and aortic rootare normal is size and structure.  9. No atrial level shunt detected by color flow  Doppler.  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  CT of the chest revealed bilateral pleural effusions.  Is consistent with a mild exacerbation of congestive heart failure.  We will attempt further diuresis with IV Lasix and monitor patient's function closely.  Clearly very frail if he is not improved significantly by the a.m. may require cardiology evaluation.  2.  Near syncope: Patient not able to perform orthostatics due to hypoxemia.  Will monitor overnight.  Recent echocardiogram in February showed markedly decreased systolic function as well as diastolic dysfunction.  Suspect near syncope may have been related to hypoxemia from increase in volume.  3.  Essential hypertension: Pressures mildly elevated at 160 on arrival now more stable at 147 given patient's age would not want him to have a blood pressure less than 130.  4.  Coronary artery disease: Noted.  Continue home medications.  5.  Generalized weakness well with bilateral lower extremity weakness: Likely will need diuresis once that is completed will likely need a physical therapy evaluation.  Possible referral for home health PT.  6.  Type 2 diabetes mellitus with nephropathy and  retinopathy: Continue home medication regimen start sliding scale insulin avoid nephrotoxic agents continue eye care as at home.  7.  Dyslipidemia: Continue home medication management.  8.  History of stroke: Noted.  9.  Nonalcoholic steatohepatitis: Noted monitor liver function closely patient without any evidence of ascites or encephalopathy.  10.  Pancytopenia: Likely related to NASH.  Monitor closely.  11.  Dementia: Noted continue to monitor.  12.  History of paroxysmal atrial fibrillation.  This occurred in restive 2019 after surgery for a hip fracture.  He was on Eliquis for short time but this was later discontinued due to worsening anemia.  ##13.  Mass of left lung: Concerning for frontal line malignancy or adenocarcinoma.  Would discuss with patient and family prior to pursuing further work-up given his advanced age. Per Ct  report "Persistent heterogeneous masslike opacity over the left lower lobe with areas of fat density measuring 4.2 x 5.9 cm. This masslike opacity persists despite resolution of the previously seen diffuse bilateral nodular airspace process. This raises possibility frontal lying malignancy/adenocarcinoma. No evidence of adenopathy. Consider PET-CT and pulmonary/thoracic surgical consultation for further evaluation."  14.  Son requesting palliative care eval for help at home.   DVT prophylaxis: Enoxaparin Code Status: Full code Family Communication: son Carlson Belland [phone] POA with his brother. Discussed code status and son wishes for thoughtful discussion about advanced care planning and possible hospice Disposition Plan: Home in 1 to 2 days Consults called: None Admission status: Observation  It is my clinical opinion that referral for OBSERVATION is reasonable and necessary in this patient based on the above information provided. The aforementioned taken together are felt to place the patient at high risk for further clinical deterioration. However it is  anticipated that the patient may be medically stable for discharge from the hospital within 24 to 48 hours.  Lady Deutscher MD FACP [facility] Pager [phone]  How to contact the [facility] Attending or Consulting provider [place] or covering provider during after hours [place], for this patient?  1. Check the care team in [facility] and look for a) attending/consulting [facility] provider listed and b) the [facility] team listed 2. Log into [email or web address] and use [place]'s universal password to access. If you do not have the password, please contact the hospital operator. 3. Locate the [facility] provider you are looking for under [facility] and page to a number that you can be directly reached. 4. If you still have difficulty reaching the provider, please page the [facility] (Director on Call) for the Hospitalists listed on amion for assistance.  If 7PM-7AM, please contact night-coverage [email or web address] Password [facility]  04/25/2019, 2:48 PM

## 2019-04-25 NOTE — ED Notes (Signed)
ED TO INPATIENT HANDOFF REPORT  ED Nurse Name and Phone #: [phone]  S Name/Age/Gender Chad Mcmillan 85 y.o. male Room/Bed: 020C/020C  Code Status   Code Status: Full Code  Home/SNF/Other Home Patient oriented to: self, place, time and situation Is this baseline? Yes   Triage Complete: Triage complete  Chief Complaint Syncope  Triage Note Pt arrives to ED from home with complaints of a near syncoal vs syncopal episode last night while walking to the bathroom. Pt states he's been falling more at home recently due to increased weakness for the last couple of weeks. Pt has pain to left arm.     Allergies No Known Allergies  Level of Care/Admitting Diagnosis ED Disposition    ED Disposition Condition [facility] Area: [place] [[id number]]  Level of Care: Telemetry Cardiac [103]  I expect the patient will be discharged within 24 hours: No (not a candidate for 5C-Observation unit)  Covid Evaluation: Confirmed COVID Negative  Diagnosis: Acute on chronic combined systolic (congestive) and diastolic (congestive) heart failure [facility]) [[id number]]  Admitting Physician: Lady Deutscher [[id number]]  Attending Physician: Lady Deutscher [[id number]]  PT Class (Do Not Modify): Observation [104]  PT Acc Code (Do Not Modify): Observation [10022]       B Medical/Surgery History Past Medical History:  Diagnosis Date  . ADENOCARCINOMA, PROSTATE 06/01/2008  . CORONARY ARTERY DISEASE 06/13/2007  . CVA 06/01/2008   x 2  . DIABETES MELLITUS, TYPE I, WITH OPHTHALMIC COMPLICATIONS 2/53/6644  . 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 HYPERKALEMIA 06/01/2008   due to DM neparopathy  . HYPERLIPIDEMIA 06/13/2007  . HYPERTENSION 06/13/2007  . NEOPLASM, MALIGNANT, SKIN, FACE 06/01/2008  . Orthostatic hypotension 12/19/2018  . Other chronic nonalcoholic liver disease 0/34/7425  . RENAL DISEASE 06/01/2008   Past Surgical History:  Procedure Laterality Date  . Carotid Duplex  09/12/2004   . CATARACT EXTRACTION    . CHOLECYSTECTOMY    . ESOPHAGOGASTRODUODENOSCOPY  06/28/1991  . FEMUR IM NAIL Right 07/17/2018  . HERNIA REPAIR     right Inguinal hernia  . INTRAMEDULLARY (IM) NAIL INTERTROCHANTERIC N/A 07/17/2018   Procedure: INTRAMEDULLARY (IM) NAIL INTERTROCHANTRIC;  Surgeon: Leandrew Koyanagi, MD;  Location: [place];  Service: Orthopedics;  Laterality: N/A;  . Rest/Stress Cardiolite  01/22/2001     A IV Location/Drains/Wounds Patient Lines/Drains/Airways Status   Active Line/Drains/Airways    Name:   Placement date:   Placement time:   Site:   Days:   Peripheral IV 12/21/18 Anterior;Right Forearm   12/21/18    1340    Forearm   125   Peripheral IV 04/25/19 Right Arm   04/25/19    1253    Arm   less than 1   External Urinary Catheter   07/23/18    0100    -   276   Incision (Closed) 07/17/18 Hip Right   07/17/18    1233     282   Pressure Injury 02/27/18 Stage II -  Partial thickness loss of dermis presenting as a shallow open ulcer with a red, pink wound bed without slough.   02/27/18    2039     422   Pressure Injury 12/19/18 Stage II -  Partial thickness loss of dermis presenting as a shallow open ulcer with a red, pink wound bed without slough. Two stage two ulcers; one on coccyx, one on left lower buttocks   12/19/18    1420  127   Wound / Incision (Open or Dehisced) 12/19/18 Diabetic ulcer Heel Right;Posterior   12/19/18    1419    Heel   127          Intake/Output Last 24 hours No intake or output data in the 24 hours ending 04/25/19 1546  Labs/Imaging Results for orders placed or performed during the hospital encounter of 04/25/19 (from the past 48 hour(s))  Urinalysis, Routine w reflex microscopic     Status: Abnormal   Collection Time: 04/25/19  9:51 AM  Result Value Ref Range   Color, Urine YELLOW YELLOW   APPearance CLEAR CLEAR   Specific Gravity, Urine 1.013 1.005 - 1.030   pH 5.0 5.0 - 8.0   Glucose, UA 50 (A) NEGATIVE mg/dL   Hgb urine dipstick  NEGATIVE NEGATIVE   Bilirubin Urine NEGATIVE NEGATIVE   Ketones, ur NEGATIVE NEGATIVE mg/dL   Protein, ur >=300 (A) NEGATIVE mg/dL   Nitrite NEGATIVE NEGATIVE   Leukocytes,Ua NEGATIVE NEGATIVE   RBC / HPF 0-5 0 - 5 RBC/hpf   WBC, UA 0-5 0 - 5 WBC/hpf   Bacteria, UA RARE (A) NONE SEEN    Comment: Performed at [facility], [address] [place], [place] [address]  POC CBG, ED     Status: Abnormal   Collection Time: 04/25/19 10:09 AM  Result Value Ref Range   Glucose-Capillary 134 (H) 70 - 99 mg/dL  Comprehensive metabolic panel     Status: Abnormal   Collection Time: 04/25/19 10:19 AM  Result Value Ref Range   Sodium 143 135 - 145 mmol/L   Potassium 4.1 3.5 - 5.1 mmol/L   Chloride 104 98 - 111 mmol/L   CO2 29 22 - 32 mmol/L   Glucose, Bld 165 (H) 70 - 99 mg/dL   BUN 61 (H) 8 - 23 mg/dL   Creatinine, Ser 2.50 (H) 0.61 - 1.24 mg/dL   Calcium 8.6 (L) 8.9 - 10.3 mg/dL   Total Protein 6.5 6.5 - 8.1 g/dL   Albumin 2.7 (L) 3.5 - 5.0 g/dL   AST 27 15 - 41 U/L   ALT 22 0 - 44 U/L   Alkaline Phosphatase 123 38 - 126 U/L   Total Bilirubin 1.0 0.3 - 1.2 mg/dL   GFR calc non Af Amer 21 (L) >60 mL/min   GFR calc Af Amer 25 (L) >60 mL/min   Anion gap 10 5 - 15    Comment: Performed at [facility], [place] [address] [place], [place] [address]  CBC with Differential     Status: Abnormal   Collection Time: 04/25/19 10:19 AM  Result Value Ref Range   WBC 5.6 4.0 - 10.5 K/uL   RBC 2.66 (L) 4.22 - 5.81 MIL/uL   Hemoglobin 8.3 (L) 13.0 - 17.0 g/dL   HCT 27.2 (L) 39.0 - 52.0 %   MCV 102.3 (H) 80.0 - 100.0 fL   MCH 31.2 26.0 - 34.0 pg   MCHC 30.5 30.0 - 36.0 g/dL   RDW 14.0 11.5 - 15.5 %   Platelets 106 (L) 150 - 400 K/uL    Comment: REPEATED TO VERIFY PLATELET COUNT CONFIRMED BY SMEAR SPECIMEN CHECKED FOR CLOTS Immature Platelet Fraction may be clinically indicated, consider ordering this additional test [id number]    nRBC 0.0 0.0 - 0.2 %   Neutrophils Relative % 79 %    Neutro Abs 4.4 1.7 - 7.7 K/uL   Lymphocytes Relative 12 %   Lymphs Abs  0.7 0.7 - 4.0 K/uL   Monocytes Relative 8 %   Monocytes Absolute 0.4 0.1 - 1.0 K/uL   Eosinophils Relative 1 %   Eosinophils Absolute 0.0 0.0 - 0.5 K/uL   Basophils Relative 0 %   Basophils Absolute 0.0 0.0 - 0.1 K/uL   Immature Granulocytes 0 %   Abs Immature Granulocytes 0.02 0.00 - 0.07 K/uL   Schistocytes PRESENT     Comment: Performed at [facility] [address], [place], [place] [address]  Brain natriuretic peptide     Status: Abnormal   Collection Time: 04/25/19 10:19 AM  Result Value Ref Range   B Natriuretic Peptide 2,964.3 (H) 0.0 - 100.0 pg/mL    Comment: Performed at [facility] [address] [place], [place] [address]  Lactic acid, plasma     Status: None   Collection Time: 04/25/19 12:17 PM  Result Value Ref Range   Lactic Acid, Venous 0.8 0.5 - 1.9 mmol/L    Comment: Performed at [facility] [address] [place], [place] [address]  D-dimer, quantitative     Status: Abnormal   Collection Time: 04/25/19 12:18 PM  Result Value Ref Range   D-Dimer, Quant 4.77 (H) 0.00 - 0.50 ug/mL-FEU    Comment: (NOTE) At the manufacturer cut-off of 0.50 ug/mL FEU, this assay has been documented to exclude PE with a sensitivity and negative predictive value of 97 to 99%.  At this time, this assay has not been approved by the FDA to exclude DVT/VTE. Results should be correlated with clinical presentation. Performed at [facility], [place] [address] [place], [place] [address]   Procalcitonin     Status: None   Collection Time: 04/25/19 12:18 PM  Result Value Ref Range   Procalcitonin 0.16 ng/mL    Comment:        Interpretation: PCT (Procalcitonin) <= 0.5 ng/mL: Systemic infection (sepsis) is not likely. Local bacterial infection is possible. (NOTE)       Sepsis PCT Algorithm           Lower Respiratory Tract                                      Infection PCT Algorithm     ----------------------------     ----------------------------         PCT < 0.25 ng/mL                PCT < 0.10 ng/mL         Strongly encourage             Strongly discourage   discontinuation of antibiotics    initiation of antibiotics    ----------------------------     -----------------------------       PCT 0.25 - 0.50 ng/mL            PCT 0.10 - 0.25 ng/mL               OR       >80% decrease in PCT            Discourage initiation of                                            antibiotics  Encourage discontinuation           of antibiotics    ----------------------------     -----------------------------         PCT >= 0.50 ng/mL              PCT 0.26 - 0.50 ng/mL               AND        <80% decrease in PCT             Encourage initiation of                                             antibiotics       Encourage continuation           of antibiotics    ----------------------------     -----------------------------        PCT >= 0.50 ng/mL                  PCT > 0.50 ng/mL               AND         increase in PCT                  Strongly encourage                                      initiation of antibiotics    Strongly encourage escalation           of antibiotics                                     -----------------------------                                           PCT <= 0.25 ng/mL                                                 OR                                        > 80% decrease in PCT                                     Discontinue / Do not initiate                                             antibiotics Performed at [facility], [place] [address], [place], [place] [address]   Lactate dehydrogenase     Status: Abnormal   Collection Time: 04/25/19 12:18 PM  Result Value Ref Range  LDH 196 (H) 98 - 192 U/L    Comment: Performed at [facility], [place] [address], [place], [place] [address]  Ferritin     Status: Abnormal   Collection Time:  04/25/19 12:18 PM  Result Value Ref Range   Ferritin 464 (H) 24 - 336 ng/mL    Comment: Performed at [facility], [place] [address], [place], [place] [address]  Fibrinogen     Status: Abnormal   Collection Time: 04/25/19 12:18 PM  Result Value Ref Range   Fibrinogen 564 (H) 210 - 475 mg/dL    Comment: Performed at [facility] [address], [place], [place] [address]  C-reactive protein     Status: Abnormal   Collection Time: 04/25/19 12:18 PM  Result Value Ref Range   CRP 2.8 (H) <1.0 mg/dL    Comment: Performed at [facility] [address] [place], [place] [address]  Triglycerides     Status: None   Collection Time: 04/25/19 12:39 PM  Result Value Ref Range   Triglycerides 86 <150 mg/dL    Comment: Performed at [facility] [address], [place], [place] [address]  SARS Coronavirus 2 (CEPHEID- Performed in [place] hospital lab), Hosp Order     Status: None   Collection Time: 04/25/19 12:57 PM  Result Value Ref Range   SARS Coronavirus 2 NEGATIVE NEGATIVE    Comment: (NOTE) If result is NEGATIVE SARS-CoV-2 target nucleic acids are NOT DETECTED. The SARS-CoV-2 RNA is generally detectable in upper and lower  respiratory specimens during the acute phase of infection. The lowest  concentration of SARS-CoV-2 viral copies this assay can detect is 250  copies / mL. A negative result does not preclude SARS-CoV-2 infection  and should not be used as the sole basis for treatment or other  patient management decisions.  A negative result may occur with  improper specimen collection / handling, submission of specimen other  than nasopharyngeal swab, presence of viral mutation(s) within the  areas targeted by this assay, and inadequate number of viral copies  (<250 copies / mL). A negative result must be combined with clinical  observations, patient history, and epidemiological information. If result is POSITIVE SARS-CoV-2 target nucleic acids are  DETECTED. The SARS-CoV-2 RNA is generally detectable in upper and lower  respiratory specimens dur ing the acute phase of infection.  Positive  results are indicative of active infection with SARS-CoV-2.  Clinical  correlation with patient history and other diagnostic information is  necessary to determine patient infection status.  Positive results do  not rule out bacterial infection or co-infection with other viruses. If result is PRESUMPTIVE POSTIVE SARS-CoV-2 nucleic acids MAY BE PRESENT.   A presumptive positive result was obtained on the submitted specimen  and confirmed on repeat testing.  While 2019 novel coronavirus  (SARS-CoV-2) nucleic acids may be present in the submitted sample  additional confirmatory testing may be necessary for epidemiological  and / or clinical management purposes  to differentiate between  SARS-CoV-2 and other Sarbecovirus currently known to infect humans.  If clinically indicated additional testing with an alternate test  methodology [phone]) is advised. The SARS-CoV-2 RNA is generally  detectable in upper and lower respiratory sp ecimens during the acute  phase of infection. The expected result is Negative. Fact Sheet for Patients:  StrictlyIdeas.no Fact Sheet for Healthcare Providers: BankingDealers.co.za This test is not yet approved or cleared by the [place] FDA and has  been authorized for detection and/or diagnosis of SARS-CoV-2 by FDA under an Emergency Use Authorization (EUA).  This EUA will remain in effect (meaning this test can be used) for the duration of the COVID-19 declaration under Section 564(b)(1) of the Act, 21 U.S.C. section 360bbb-3(b)(1), unless the authorization is terminated or revoked sooner. Performed at [facility], [place] [address], [place], [place] [address]    Ct Head Wo Contrast  Result Date: 04/25/2019 CLINICAL DATA:  Syncopal episode.  Multiple falls.  EXAM: CT HEAD WITHOUT CONTRAST CT CERVICAL SPINE WITHOUT CONTRAST TECHNIQUE: Multidetector CT imaging of the head and cervical spine was performed following the standard protocol without intravenous contrast. Multiplanar CT image reconstructions of the cervical spine were also generated. COMPARISON:  Brain CT 07/17/2018. FINDINGS: CT HEAD FINDINGS Brain: Ventricles and sulci are prominent compatible with atrophy. Periventricular and subcortical white matter hypodensity compatible with chronic microvascular ischemic changes. No evidence for acute cortically based infarct, intracranial hemorrhage, mass lesion mass-effect. Vascular: Unremarkable Skull: Intact. Sinuses/Orbits: Paranasal sinuses are well aerated. Focal soft tissue and mucosal thickening within the right maxillary sinus (image 6; series 4). Orbits are unremarkable. Other: None. CT CERVICAL SPINE FINDINGS Alignment: Normal anatomic alignment. Skull base and vertebrae: No acute fracture. No primary bone lesion or focal pathologic process. Soft tissues and spinal canal: No prevertebral fluid or swelling. No visible canal hematoma. Disc levels: Degenerative disc disease most pronounced C3-4 and C6-7. No evidence for acute fracture. Upper chest: Unremarkable. Other: Bilateral carotid arterial vascular calcifications. IMPRESSION: No acute intracranial process. Atrophy and chronic microvascular ischemic changes. No acute cervical spine fracture.  Degenerative disc disease. Soft tissue density material within the right maxillary sinus is nonspecific however may represent sequelae of chronic sinusitis. Recommend clinical correlation. Electronically Signed   By: Lovey Newcomer M.D.   On: 04/25/2019 11:34   Ct Chest Wo Contrast  Result Date: 04/25/2019 CLINICAL DATA:  Hypoxia plan standing.  Increased weakness. EXAM: CT CHEST WITHOUT CONTRAST TECHNIQUE: Multidetector CT imaging of the chest was performed following the standard protocol without IV contrast.  COMPARISON:  02/27/2018 and chest x-ray 04/25/2019 FINDINGS: Cardiovascular: Mild cardiomegaly. Calcified plaque over the left main and 3 vessel coronary arteries. Calcified plaque over the thoracic aorta. Remaining vascular structures are unremarkable. Mediastinum/Nodes: No significant mediastinal or hilar adenopathy. No axillary adenopathy. Remaining mediastinal structures are unremarkable. Lungs/Pleura: Lungs are adequately inflated as there has been interval resolution of the previously seen bilateral nodular airspace process, although there is persistence of masslike heterogeneous density over the left lower lobe measuring 4.2 x 5.9 cm. The medial half of this masslike density has Hounsfield unit measurements of fat density as the lateral aspect may be due in part to compressive atelectasis. Small to moderate bilateral pleural effusions left greater than right. Stable nodular scarring over the right middle lobe. Aspirate material over the dependent portion of the upper trachea. Upper Abdomen: No acute findings. Calcified plaque over the abdominal aorta. Musculoskeletal: Degenerative change of the spine. IMPRESSION: Mild to moderate size bilateral pleural effusions left greater than right. Persistent heterogeneous masslike opacity over the left lower lobe with areas of fat density measuring 4.2 x 5.9 cm. This masslike opacity persists despite resolution of the previously seen diffuse bilateral nodular airspace process. This raises possibility frontal lying malignancy/adenocarcinoma. No evidence of adenopathy. Consider PET-CT and pulmonary/thoracic surgical consultation for further evaluation. Stable nodular scarring over the right middle lobe. Aspirate material over the dependent portion of the upper trachea. Mild cardiomegaly. Left main and  3 vessel atherosclerotic coronary artery disease. Aortic Atherosclerosis (ICD10-I70.0). Electronically Signed   By: Marin Olp M.D.   On: 04/25/2019 14:35   Ct Cervical  Spine Wo Contrast  Result Date: 04/25/2019 CLINICAL DATA:  Syncopal episode.  Multiple falls. EXAM: CT HEAD WITHOUT CONTRAST CT CERVICAL SPINE WITHOUT CONTRAST TECHNIQUE: Multidetector CT imaging of the head and cervical spine was performed following the standard protocol without intravenous contrast. Multiplanar CT image reconstructions of the cervical spine were also generated. COMPARISON:  Brain CT 07/17/2018. FINDINGS: CT HEAD FINDINGS Brain: Ventricles and sulci are prominent compatible with atrophy. Periventricular and subcortical white matter hypodensity compatible with chronic microvascular ischemic changes. No evidence for acute cortically based infarct, intracranial hemorrhage, mass lesion mass-effect. Vascular: Unremarkable Skull: Intact. Sinuses/Orbits: Paranasal sinuses are well aerated. Focal soft tissue and mucosal thickening within the right maxillary sinus (image 6; series 4). Orbits are unremarkable. Other: None. CT CERVICAL SPINE FINDINGS Alignment: Normal anatomic alignment. Skull base and vertebrae: No acute fracture. No primary bone lesion or focal pathologic process. Soft tissues and spinal canal: No prevertebral fluid or swelling. No visible canal hematoma. Disc levels: Degenerative disc disease most pronounced C3-4 and C6-7. No evidence for acute fracture. Upper chest: Unremarkable. Other: Bilateral carotid arterial vascular calcifications. IMPRESSION: No acute intracranial process. Atrophy and chronic microvascular ischemic changes. No acute cervical spine fracture.  Degenerative disc disease. Soft tissue density material within the right maxillary sinus is nonspecific however may represent sequelae of chronic sinusitis. Recommend clinical correlation. Electronically Signed   By: Lovey Newcomer M.D.   On: 04/25/2019 11:34   Dg Chest Portable 1 View  Result Date: 04/25/2019 CLINICAL DATA:  Syncopal episode. EXAM: PORTABLE CHEST 1 VIEW COMPARISON:  Chest radiograph 12/19/2018. FINDINGS:  Monitoring leads overlie the patient. Stable cardiomegaly. Aortic atherosclerosis. Moderate bilateral pleural effusions with underlying bibasilar consolidation. No pneumothorax. IMPRESSION: Cardiomegaly, bilateral pleural effusions and underlying opacities which may represent atelectasis, edema and/or infection. Electronically Signed   By: Lovey Newcomer M.D.   On: 04/25/2019 10:37    Pending Labs Unresulted Labs (From admission, onward)    Start     Ordered   04/26/19 8502  Basic metabolic panel  Daily,   R     04/25/19 1520   04/25/19 1520  Hemoglobin A1c  Add-on,   R    Comments:  To assess prior glycemic control    04/25/19 1520   04/25/19 1517  CBC  (heparin)  Once,   R    Comments:  Baseline for heparin therapy IF NOT ALREADY DRAWN.  Notify MD if PLT < 100 K.    04/25/19 1520   04/25/19 1517  Creatinine, serum  (heparin)  Once,   R    Comments:  Baseline for heparin therapy IF NOT ALREADY DRAWN.    04/25/19 1520   04/25/19 1159  Blood Culture (routine x 2)  BLOOD CULTURE X 2,   STAT     04/25/19 1158   04/25/19 1159  Lactic acid, plasma  Now then every 2 hours,   STAT     04/25/19 1158   04/25/19 0951  Urine culture  ONCE - STAT,   STAT     04/25/19 0951          Vitals/Pain Today's Vitals   04/25/19 1330 04/25/19 1515 04/25/19 1530 04/25/19 1544  BP: (!) 147/82     Pulse: (!) 52 (!) 46 (!) 49 (!) 52  Resp: 13 17 13 13   Temp:  TempSrc:      SpO2: 100% 98% 100% 99%  PainSc:        Isolation Precautions Droplet and Contact precautions  Medications Medications  aspirin EC tablet 81 mg (has no administration in time range)  atorvastatin (LIPITOR) tablet 80 mg (has no administration in time range)  isosorbide-hydrALAZINE (BIDIL) 20-37.5 MG per tablet 1 tablet (has no administration in time range)  metoprolol tartrate (LOPRESSOR) tablet 25 mg (has no administration in time range)  nitroGLYCERIN (NITROSTAT) SL tablet 0.4 mg (has no administration in time range)   donepezil (ARICEPT) tablet 5 mg (has no administration in time range)  LORazepam (ATIVAN) tablet 0.5 mg (has no administration in time range)  repaglinide (PRANDIN) tablet 0.5 mg (has no administration in time range)  polyethylene glycol (MIRALAX / GLYCOLAX) packet 17 g (has no administration in time range)  senna-docusate (Senokot-S) tablet 1 tablet (has no administration in time range)  vitamin B-12 (CYANOCOBALAMIN) tablet 1,000 mcg (has no administration in time range)  Ferrous Gluconate TABS 324 mg (has no administration in time range)  furosemide (LASIX) injection 60 mg (has no administration in time range)  sodium chloride flush (NS) 0.9 % injection 3 mL (has no administration in time range)  sodium chloride flush (NS) 0.9 % injection 3 mL (has no administration in time range)  0.9 %  sodium chloride infusion (has no administration in time range)  acetaminophen (TYLENOL) tablet 650 mg (has no administration in time range)  ondansetron (ZOFRAN) injection 4 mg (has no administration in time range)  heparin injection 5,000 Units (has no administration in time range)  potassium chloride SA (K-DUR) CR tablet 20 mEq (has no administration in time range)  insulin aspart (novoLOG) injection 0-15 Units (has no administration in time range)  furosemide (LASIX) injection 60 mg (60 mg Intravenous Given 04/25/19 1443)    Mobility walks with device High fall risk   Focused Assessments Pulmonary Assessment Handoff:  Lung sounds:   O2 Device: Nasal Cannula O2 Flow Rate (L/min): 2 L/min      R Recommendations: See Admitting Provider Note  Report given to:   Additional Notes:  Covid negative

## 2019-04-25 NOTE — Plan of Care (Signed)
  Problem: Education: Goal: Ability to demonstrate management of disease process will improve Outcome: Progressing   Problem: Activity: Goal: Capacity to carry out activities will improve Outcome: Progressing   Problem: Education: Goal: Knowledge of General Education information will improve Description Including pain rating scale, medication(s)/side effects and non-pharmacologic comfort measures Outcome: Progressing   Problem: Nutrition: Goal: Adequate nutrition will be maintained Outcome: Progressing

## 2019-04-25 NOTE — ED Triage Notes (Signed)
Pt arrives to ED from home with complaints of a near syncoal vs syncopal episode last night while walking to the bathroom. Pt states he's been falling more at home recently due to increased weakness for the last couple of weeks. Pt has pain to left arm.

## 2019-04-25 NOTE — ED Provider Notes (Signed)
[facility] EMERGENCY DEPARTMENT Provider Note   CSN: [id number] Arrival date & time: 04/25/19  0930    History   Chief Complaint Chief Complaint  Patient presents with   Near Syncope    HPI Chad Mcmillan is a 85 y.o. male with history of diabetes mellitus, CVA, CAD, diabetic retinopathy, hyperlipidemia, hypertension, diabetic nephropathy, nonalcoholic liver disease, orthostatic hypotension presents brought in by EMS for evaluation of syncopal episode.  The patient tells me that for the last 6 months or so he has had progressively worsening weakness of the bilateral lower extremities which he notices with ambulation.  He states the weakness is worse distally.  He reports that he has had generalized weakness over the last several days however.  Also notes his urine has been darker than usual and notes some dysuria at the start of his stream when urinating.  Denies fevers, chills, cough.  He does note progressively worsening shortness of breath which is chronic but thinks this has worsened over the last few days as well.  He reports that his weakness and shortness of breath is currently being evaluated by his PCP "but they have not been able to find the cause or make it better ".  He states that he has required assistance with ambulation for the last several months due to his lower extremity weakness.  This morning at around 2 AM he reports that he got up to go to the bathroom but upon taking a few steps began to feel very weak and so lowered himself to the ground.  He does report losing consciousness but does not think that he hit his head.  He states he thinks he laid on the ground for approximately 2 hours before his wife awoke and helped him get back in bed.  He states that he then rested for another few hours but when he attempted to get out of bed he was unable to do so and so called EMS.  Presently he denies headache, neck pain, vision changes, numbness or tingling of the  extremities, chest pain, abdominal pain, nausea, or vomiting.  He is full code.     The history is provided by the patient.    Past Medical History:  Diagnosis Date   ADENOCARCINOMA, PROSTATE 06/01/2008   CORONARY ARTERY DISEASE 06/13/2007   CVA 06/01/2008   x 2   DIABETES MELLITUS, TYPE I, WITH OPHTHALMIC COMPLICATIONS 9/35/7017   DM retinopathy ([place])    HYPERKALEMIA 06/01/2008   due to DM neparopathy   HYPERLIPIDEMIA 06/13/2007   HYPERTENSION 06/13/2007   NEOPLASM, MALIGNANT, SKIN, FACE 06/01/2008   Orthostatic hypotension 12/19/2018   Other chronic nonalcoholic liver disease 7/93/9030   RENAL DISEASE 06/01/2008    Patient Active Problem List   Diagnosis Date Noted   Diabetes ([place]) 03/17/2019   Physical deconditioning 12/29/2018   Acute combined systolic and diastolic heart failure (HCC)    Orthostatic hypotension 12/19/2018   Acute on chronic diastolic (congestive) heart failure ([place]) 12/19/2018   Congestive heart failure (CHF) ([place]) 12/19/2018   Pressure injury of skin 12/19/2018   Vitamin B12 deficiency 12/13/2018   Vitamin D deficiency 12/13/2018   Paroxysmal atrial fibrillation with RVR (HCC)    Chronic diastolic heart failure (HCC)    S/P CABG (coronary artery bypass graft)    Closed fracture of acromial end of left clavicle 07/19/2018    Class: Acute   Closed displaced fracture of greater trochanter of right femur ([place]) 07/17/2018   History of  thrombocytopenia 07/17/2018   Hypocalcemia 02/12/2018   Agitation    Dementia (HCC)    CKD (chronic kidney disease), stage IV ([place]) 01/14/2018   Inguinal hernia of right side without obstruction or gangrene 01/12/2018   Vertigo 01/07/2018   LBBB (left bundle branch block) 01/07/2018   Generalized weakness 01/07/2018   Foot pain, bilateral 10/07/2017   Dizzy 01/31/2016   Pancytopenia ([place]) 07/27/2012   Anemia 07/27/2012   NASH (nonalcoholic steatohepatitis) 07/17/2010    ADENOCARCINOMA, PROSTATE 06/01/2008   Hyperkalemia 06/01/2008   History of stroke 06/01/2008   NEOPLASM, MALIGNANT, SKIN, FACE 06/01/2008   Dyslipidemia 06/13/2007   Essential hypertension 06/13/2007   CAD (coronary artery disease) 06/13/2007    Past Surgical History:  Procedure Laterality Date   Carotid Duplex  09/12/2004   CATARACT EXTRACTION     CHOLECYSTECTOMY     ESOPHAGOGASTRODUODENOSCOPY  06/28/1991   FEMUR IM NAIL Right 07/17/2018   HERNIA REPAIR     right Inguinal hernia   INTRAMEDULLARY (IM) NAIL INTERTROCHANTERIC N/A 07/17/2018   Procedure: INTRAMEDULLARY (IM) NAIL INTERTROCHANTRIC;  Surgeon: Leandrew Koyanagi, MD;  Location: [facility];  Service: Orthopedics;  Laterality: N/A;   Rest/Stress Cardiolite  01/22/2001        Home Medications    Prior to Admission medications   Medication Sig Start Date End Date Taking? Authorizing Provider  aspirin EC 81 MG tablet Take 1 tablet (81 mg total) by mouth daily. 08/04/17  Yes [name], MD  atorvastatin (LIPITOR) 40 MG tablet TAKE 2 TABLETS BY MOUTH ONCE DAILY AT 6 IN THE EVENING Patient taking differently: Take 80 mg by mouth daily at 6 PM.  04/09/19  Yes Burns, Claudina Lick, MD  cyanocobalamin 1000 MCG tablet Take 1,000 mcg by mouth daily.   Yes [provider]  donepezil (ARICEPT) 5 MG tablet TAKE 1 TABLET BY MOUTH AT BEDTIME Patient taking differently: Take 5 mg by mouth at bedtime.  04/09/19  Yes Burns, Claudina Lick, MD  Ferrous Gluconate 324 (37.5 Fe) MG TABS Take 324 mg by mouth daily.    Yes [provider]  furosemide (LASIX) 40 MG tablet Take 40 mg by mouth every other day.    Yes [provider]  isosorbide-hydrALAZINE (BIDIL) 20-37.5 MG tablet Take 1 tablet by mouth 3 (three) times daily. 01/25/19  Yes [name], MD  LORazepam (ATIVAN) 0.5 MG tablet Take 1 tablet (0.5 mg total) by mouth at bedtime as needed for anxiety. 12/02/18  Yes Burns, Claudina Lick, MD  meclizine (ANTIVERT) 12.5 MG tablet  TAKE 1 TABLET BY MOUTH THREE TIMES DAILY AS NEEDED FOR DIZZINESS Patient taking differently: Take 12.5 mg by mouth 3 (three) times daily as needed for dizziness.  06/30/18  Yes [name], MD  metoprolol tartrate (LOPRESSOR) 25 MG tablet Take 1 tablet (25 mg total) by mouth 2 (two) times daily. 01/25/19  Yes [name], MD  nitroGLYCERIN (NITROSTAT) 0.4 MG SL tablet Place 1 tablet (0.4 mg total) under the tongue every 5 (five) minutes as needed. For chest pain 08/04/17  Yes [name], MD  polyethylene glycol [facility] / GLYCOLAX) packet Take 17 g by mouth daily. 07/25/18  Yes Hongalgi, Lenis Dickinson, MD  repaglinide (PRANDIN) 0.5 MG tablet Take 1 tablet (0.5 mg total) by mouth 2 (two) times daily before a meal. 03/18/19  Yes [name], MD  senna-docusate (SENOKOT-S) 8.6-50 MG tablet Take 1 tablet by mouth at bedtime as needed for mild constipation or moderate constipation. 07/24/18  Yes Hongalgi, Lenis Dickinson, MD  Blood Glucose Monitoring Suppl (ONE TOUCH ULTRA 2) w/Device KIT 1 each by Does not apply route 2 (two) times a day. Use to monitor glucose levels BID; E11.9; NPI [id number] 03/29/19   [name], MD  glucose blood (ONE TOUCH ULTRA TEST) test strip Use 1 test strip to monitor glucose levels BID; E11.9; NPI [id number] 03/17/19   [name], MD  OneTouch Delica Lancets 79X MISC Use 1 lancet to monitor glucose levels BID; E11.9; NPI [id number] 03/17/19   [name], MD    Family History Family History  Problem Relation Age of Onset   Vision loss Son     Social History Social History   Tobacco Use   Smoking status: Former Smoker    Last attempt to quit: 11/18/1978    Years since quitting: 40.4   Smokeless tobacco: Never Used  Substance Use Topics   Alcohol use: No   Drug use: No     Allergies   Patient has no known allergies.   Review of Systems Review of Systems  Constitutional: Positive for fatigue.  Eyes: Negative for photophobia and visual disturbance.  Respiratory:  Positive for shortness of breath. Negative for cough.   Cardiovascular: Negative for chest pain and leg swelling.  Gastrointestinal: Negative for abdominal pain, diarrhea, nausea and vomiting.  Genitourinary: Positive for dysuria.  Neurological: Positive for weakness and light-headedness. Negative for headaches.  All other systems reviewed and are negative.    Physical Exam Updated Vital Signs BP (!) 167/83 (BP Location: Right Arm)    Pulse 67    Temp 98.5 F (36.9 C) (Oral)    Resp 14    SpO2 97%   Physical Exam Vitals signs and nursing note reviewed.  Constitutional:      General: He is not in acute distress.    Appearance: He is well-developed.  HENT:     Head: Normocephalic and atraumatic.     Comments: No Battle's signs, no raccoon's eyes, no rhinorrhea. No hemotympanum. No tenderness to palpation of the face or skull. No deformity, crepitus, or swelling noted.  Eyes:     General:        Right eye: No discharge.        Left eye: No discharge.     Conjunctiva/sclera: Conjunctivae normal.  Neck:     Vascular: No JVD.     Trachea: No tracheal deviation.     Comments: C-collar in place.  Diffuse midline cervical spine tenderness with no deformity, crepitus, or step-off.  No focal tenderness. Cardiovascular:     Rate and Rhythm: Normal rate.     Comments: 1+ radial and DP/PT pulses bilaterally.  No lower extremity edema. Pulmonary:     Effort: Pulmonary effort is normal.     Comments: Speaking in full sentences without difficulty.  Reports he feels better with supplemental oxygen 2 L nasal cannula but SPO2 saturations 98% on room air.  Globally diminished breath sounds with scattered wheezes noted Abdominal:     General: There is no distension.  Musculoskeletal:     Comments: 4+/5 strength of BUE and BLE major muscle groups.  Pelvis appears stable.  No midline thoracic or lumbar spine tenderness.  No deformity, crepitus, or step-off noted.  He has a chronic deformity of the  left upper arm from a previous injury years ago.  No tenderness to palpation crepitus or swelling noted to the extremities.  Skin:    General: Skin is warm and dry.  Findings: No erythema.     Comments: 1 cm stage II decubitus ulcer to the right buttock with surrounding stage I decubitus ulcerations.  No active drainage.  Neurological:     Mental Status: He is alert.     Comments: Fluent speech with no evidence of dysarthria or aphasia.  No facial droop.  Cranial nerves appear grossly intact.  He is oriented to person and place but not time.  He thinks that the President of the [place] is [employer].  He is able to give a coherent history regarding events leading up to presentation to the ED.  Moving extremities spontaneously.  Sensation intact to soft touch of bilateral upper and lower extremities.  Psychiatric:        Behavior: Behavior normal.      ED Treatments / Results  Labs (all labs ordered are listed, but only abnormal results are displayed) Labs Reviewed  COMPREHENSIVE METABOLIC PANEL - Abnormal; Notable for the following components:      Result Value   Glucose, Bld 165 (*)    BUN 61 (*)    Creatinine, Ser 2.50 (*)    Calcium 8.6 (*)    Albumin 2.7 (*)    GFR calc non Af Amer 21 (*)    GFR calc Af Amer 25 (*)    All other components within normal limits  CBC WITH DIFFERENTIAL/PLATELET - Abnormal; Notable for the following components:   RBC 2.66 (*)    Hemoglobin 8.3 (*)    HCT 27.2 (*)    MCV 102.3 (*)    Platelets 106 (*)    All other components within normal limits  URINALYSIS, ROUTINE W REFLEX MICROSCOPIC - Abnormal; Notable for the following components:   Glucose, UA 50 (*)    Protein, ur >=300 (*)    Bacteria, UA RARE (*)    All other components within normal limits  LACTATE DEHYDROGENASE - Abnormal; Notable for the following components:   LDH 196 (*)    All other components within normal limits  CBG MONITORING, ED - Abnormal; Notable for the  following components:   Glucose-Capillary 134 (*)    All other components within normal limits  URINE CULTURE  SARS CORONAVIRUS 2 (HOSPITAL ORDER, [facility])  CULTURE, BLOOD (ROUTINE X 2)  CULTURE, BLOOD (ROUTINE X 2)  D-DIMER, QUANTITATIVE (NOT AT [facility])  PROCALCITONIN  FERRITIN  TRIGLYCERIDES  FIBRINOGEN  C-REACTIVE PROTEIN  LACTIC ACID, PLASMA  LACTIC ACID, PLASMA    EKG EKG Interpretation  Date/Time:  Sunday April 25 2019 09:44:57 EDT Ventricular Rate:  70 PR Interval:    QRS Duration: 164 QT Interval:  464 QTC Calculation: 460 R Axis:   -35 Text Interpretation:  Sinus rhythm Supraventricular bigeminy Prolonged PR interval Left bundle branch block PVCs not present compared to prior tracing Confirmed by Dorie Rank [phone]) on 04/25/2019 9:51:16 AM   Radiology Ct Head Wo Contrast  Result Date: 04/25/2019 CLINICAL DATA:  Syncopal episode.  Multiple falls. EXAM: CT HEAD WITHOUT CONTRAST CT CERVICAL SPINE WITHOUT CONTRAST TECHNIQUE: Multidetector CT imaging of the head and cervical spine was performed following the standard protocol without intravenous contrast. Multiplanar CT image reconstructions of the cervical spine were also generated. COMPARISON:  Brain CT 07/17/2018. FINDINGS: CT HEAD FINDINGS Brain: Ventricles and sulci are prominent compatible with atrophy. Periventricular and subcortical white matter hypodensity compatible with chronic microvascular ischemic changes. No evidence for acute cortically based infarct, intracranial hemorrhage, mass lesion mass-effect. Vascular: Unremarkable Skull:  Intact. Sinuses/Orbits: Paranasal sinuses are well aerated. Focal soft tissue and mucosal thickening within the right maxillary sinus (image 6; series 4). Orbits are unremarkable. Other: None. CT CERVICAL SPINE FINDINGS Alignment: Normal anatomic alignment. Skull base and vertebrae: No acute fracture. No primary bone lesion or focal pathologic process. Soft tissues  and spinal canal: No prevertebral fluid or swelling. No visible canal hematoma. Disc levels: Degenerative disc disease most pronounced C3-4 and C6-7. No evidence for acute fracture. Upper chest: Unremarkable. Other: Bilateral carotid arterial vascular calcifications. IMPRESSION: No acute intracranial process. Atrophy and chronic microvascular ischemic changes. No acute cervical spine fracture.  Degenerative disc disease. Soft tissue density material within the right maxillary sinus is nonspecific however may represent sequelae of chronic sinusitis. Recommend clinical correlation. Electronically Signed   By: Lovey Newcomer M.D.   On: 04/25/2019 11:34   Ct Cervical Spine Wo Contrast  Result Date: 04/25/2019 CLINICAL DATA:  Syncopal episode.  Multiple falls. EXAM: CT HEAD WITHOUT CONTRAST CT CERVICAL SPINE WITHOUT CONTRAST TECHNIQUE: Multidetector CT imaging of the head and cervical spine was performed following the standard protocol without intravenous contrast. Multiplanar CT image reconstructions of the cervical spine were also generated. COMPARISON:  Brain CT 07/17/2018. FINDINGS: CT HEAD FINDINGS Brain: Ventricles and sulci are prominent compatible with atrophy. Periventricular and subcortical white matter hypodensity compatible with chronic microvascular ischemic changes. No evidence for acute cortically based infarct, intracranial hemorrhage, mass lesion mass-effect. Vascular: Unremarkable Skull: Intact. Sinuses/Orbits: Paranasal sinuses are well aerated. Focal soft tissue and mucosal thickening within the right maxillary sinus (image 6; series 4). Orbits are unremarkable. Other: None. CT CERVICAL SPINE FINDINGS Alignment: Normal anatomic alignment. Skull base and vertebrae: No acute fracture. No primary bone lesion or focal pathologic process. Soft tissues and spinal canal: No prevertebral fluid or swelling. No visible canal hematoma. Disc levels: Degenerative disc disease most pronounced C3-4 and C6-7. No  evidence for acute fracture. Upper chest: Unremarkable. Other: Bilateral carotid arterial vascular calcifications. IMPRESSION: No acute intracranial process. Atrophy and chronic microvascular ischemic changes. No acute cervical spine fracture.  Degenerative disc disease. Soft tissue density material within the right maxillary sinus is nonspecific however may represent sequelae of chronic sinusitis. Recommend clinical correlation. Electronically Signed   By: Lovey Newcomer M.D.   On: 04/25/2019 11:34   Dg Chest Portable 1 View  Result Date: 04/25/2019 CLINICAL DATA:  Syncopal episode. EXAM: PORTABLE CHEST 1 VIEW COMPARISON:  Chest radiograph 12/19/2018. FINDINGS: Monitoring leads overlie the patient. Stable cardiomegaly. Aortic atherosclerosis. Moderate bilateral pleural effusions with underlying bibasilar consolidation. No pneumothorax. IMPRESSION: Cardiomegaly, bilateral pleural effusions and underlying opacities which may represent atelectasis, edema and/or infection. Electronically Signed   By: Lovey Newcomer M.D.   On: 04/25/2019 10:37    Procedures Procedures (including critical care time)  Medications Ordered in ED Medications  furosemide (LASIX) injection 60 mg (has no administration in time range)     Initial Impression / Assessment and Plan / ED Course  I have reviewed the triage vital signs and the nursing notes.  Pertinent labs & imaging results that were available during my care of the patient were reviewed by me and considered in my medical decision making (see chart for details).        Patient presenting for evaluation of progressively worsening generalized weakness and shortness of breath for the last 3 to 4 days.  He is afebrile, hypertensive in the ED but vital signs otherwise stable at rest.  He has a stable SPO2 saturations at rest  but is requesting supplemental oxygen for comfort which he states is helpful.  Lab work reviewed by me shows no leukocytosis.  He is anemic but  appears to be at baseline.  Also has renal insufficiency which appears to be at baseline.  No metabolic derangements.  UA does not suggest UTI or nephrolithiasis but he does have proteinuria.  EKG shows normal sinus rhythm, prolonged PR interval, left bundle branch block.  Imaging of the head and neck show no acute intracranial abnormalities or cervical spine fracture.  He does have some degenerative changes which are chronic.  His chest x-ray shows cardiomegaly, bilateral pleural effusions and underlying opacities which could represent atelectasis versus edema versus infection. On reevaluation, patient resting comfortably in bed.  We attempted to ambulate him with the aid of a walker but upon standing and steady himself on the walker his oxygen saturations dropped to 86% on room air and he became significantly tachypneic and appeared quite unsteady on his feet we did not ambulate him further.  Given hypoxia upon standing, will obtain COVID test.  Patient will require admission for further evaluation and management of his hypoxia, inability to ambulate steadily, and worsening generalized weakness.  1:11 PM CONSULT with Dr. Evangeline Gula with [facility] hospitalist service who agrees to assume care of patient and bring him into the hospital for further evaluation and management.  She requests addition of a noncontrast chest CT for further evaluation as well as a dose of 60 mg IV Lasix in the ED.  Final Clinical Impressions(s) / ED Diagnoses   Final diagnoses:  Acute respiratory failure with hypoxia [facility])  Syncope and collapse    ED Discharge Orders    None       Debroah Baller 04/25/19 1311    Dorie Rank, MD 04/27/19 1528

## 2019-04-25 NOTE — ED Notes (Signed)
Buttocks cleansed with soap and warm water. Allevyn protective dressing and xeroform gauze applied to wound on R buttocks per EDP, allevyn dressing applied to red area over right greater trochanter.

## 2019-04-26 ENCOUNTER — Encounter (HOSPITAL_COMMUNITY): Payer: Self-pay | Admitting: Primary Care

## 2019-04-26 ENCOUNTER — Telehealth: Payer: Self-pay | Admitting: Internal Medicine

## 2019-04-26 DIAGNOSIS — I5043 Acute on chronic combined systolic (congestive) and diastolic (congestive) heart failure: Secondary | ICD-10-CM | POA: Diagnosis not present

## 2019-04-26 DIAGNOSIS — D631 Anemia in chronic kidney disease: Secondary | ICD-10-CM | POA: Diagnosis present

## 2019-04-26 DIAGNOSIS — Z79899 Other long term (current) drug therapy: Secondary | ICD-10-CM | POA: Diagnosis not present

## 2019-04-26 DIAGNOSIS — Z20828 Contact with and (suspected) exposure to other viral communicable diseases: Secondary | ICD-10-CM | POA: Diagnosis present

## 2019-04-26 DIAGNOSIS — Z85828 Personal history of other malignant neoplasm of skin: Secondary | ICD-10-CM | POA: Diagnosis not present

## 2019-04-26 DIAGNOSIS — E11319 Type 2 diabetes mellitus with unspecified diabetic retinopathy without macular edema: Secondary | ICD-10-CM | POA: Diagnosis present

## 2019-04-26 DIAGNOSIS — R55 Syncope and collapse: Secondary | ICD-10-CM | POA: Diagnosis present

## 2019-04-26 DIAGNOSIS — I130 Hypertensive heart and chronic kidney disease with heart failure and stage 1 through stage 4 chronic kidney disease, or unspecified chronic kidney disease: Secondary | ICD-10-CM | POA: Diagnosis present

## 2019-04-26 DIAGNOSIS — R531 Weakness: Secondary | ICD-10-CM

## 2019-04-26 DIAGNOSIS — Z7189 Other specified counseling: Secondary | ICD-10-CM

## 2019-04-26 DIAGNOSIS — J9601 Acute respiratory failure with hypoxia: Secondary | ICD-10-CM | POA: Diagnosis present

## 2019-04-26 DIAGNOSIS — Z8673 Personal history of transient ischemic attack (TIA), and cerebral infarction without residual deficits: Secondary | ICD-10-CM | POA: Diagnosis not present

## 2019-04-26 DIAGNOSIS — E1122 Type 2 diabetes mellitus with diabetic chronic kidney disease: Secondary | ICD-10-CM | POA: Diagnosis present

## 2019-04-26 DIAGNOSIS — F0390 Unspecified dementia without behavioral disturbance: Secondary | ICD-10-CM | POA: Diagnosis present

## 2019-04-26 DIAGNOSIS — I480 Paroxysmal atrial fibrillation: Secondary | ICD-10-CM | POA: Diagnosis present

## 2019-04-26 DIAGNOSIS — Z8546 Personal history of malignant neoplasm of prostate: Secondary | ICD-10-CM | POA: Diagnosis not present

## 2019-04-26 DIAGNOSIS — R918 Other nonspecific abnormal finding of lung field: Secondary | ICD-10-CM | POA: Diagnosis not present

## 2019-04-26 DIAGNOSIS — Z87891 Personal history of nicotine dependence: Secondary | ICD-10-CM | POA: Diagnosis not present

## 2019-04-26 DIAGNOSIS — E785 Hyperlipidemia, unspecified: Secondary | ICD-10-CM | POA: Diagnosis present

## 2019-04-26 DIAGNOSIS — Z9849 Cataract extraction status, unspecified eye: Secondary | ICD-10-CM | POA: Diagnosis not present

## 2019-04-26 DIAGNOSIS — Z515 Encounter for palliative care: Secondary | ICD-10-CM | POA: Diagnosis not present

## 2019-04-26 DIAGNOSIS — D61818 Other pancytopenia: Secondary | ICD-10-CM | POA: Diagnosis present

## 2019-04-26 DIAGNOSIS — N184 Chronic kidney disease, stage 4 (severe): Secondary | ICD-10-CM | POA: Diagnosis present

## 2019-04-26 DIAGNOSIS — K7581 Nonalcoholic steatohepatitis (NASH): Secondary | ICD-10-CM | POA: Diagnosis present

## 2019-04-26 DIAGNOSIS — I951 Orthostatic hypotension: Secondary | ICD-10-CM | POA: Diagnosis present

## 2019-04-26 DIAGNOSIS — Z7982 Long term (current) use of aspirin: Secondary | ICD-10-CM | POA: Diagnosis not present

## 2019-04-26 DIAGNOSIS — I2510 Atherosclerotic heart disease of native coronary artery without angina pectoris: Secondary | ICD-10-CM | POA: Diagnosis present

## 2019-04-26 LAB — GLUCOSE, CAPILLARY
Glucose-Capillary: 132 mg/dL — ABNORMAL HIGH (ref 70–99)
Glucose-Capillary: 266 mg/dL — ABNORMAL HIGH (ref 70–99)
Glucose-Capillary: 143 mg/dL — ABNORMAL HIGH (ref 70–99)
Glucose-Capillary: 87 mg/dL (ref 70–99)

## 2019-04-26 LAB — URINE CULTURE: Culture: <10000 — AB

## 2019-04-26 LAB — BASIC METABOLIC PANEL
Anion gap: 10 (ref 5–15)
BUN: 63 mg/dL — ABNORMAL HIGH (ref 8–23)
CO2: 28 mmol/L (ref 22–32)
Calcium: 8.4 mg/dL — ABNORMAL LOW (ref 8.9–10.3)
Chloride: 102 mmol/L (ref 98–111)
Creatinine, Ser: 2.64 mg/dL — ABNORMAL HIGH (ref 0.61–1.24)
GFR calc Af Amer: 23 mL/min — ABNORMAL LOW (ref >60)
GFR calc non Af Amer: 20 mL/min — ABNORMAL LOW (ref >60)
Glucose, Bld: 152 mg/dL — ABNORMAL HIGH (ref 70–99)
Potassium: 4.4 mmol/L (ref 3.5–5.1)
Sodium: 140 mmol/L (ref 135–145)

## 2019-04-26 LAB — MRSA PCR SCREENING: MRSA by PCR: POSITIVE — AB

## 2019-04-26 MED ORDER — MUPIROCIN 2 % EX OINT
1.0000 "application " | TOPICAL_OINTMENT | Freq: Two times a day (BID) | CUTANEOUS | Status: DC
  Administered 2019-04-26 – 2019-04-28 (×5): 1 via NASAL
  Filled 2019-04-26 (×2): qty 22

## 2019-04-26 MED ORDER — CHLORHEXIDINE GLUCONATE CLOTH 2 % EX PADS
6.0000 | MEDICATED_PAD | Freq: Every day | CUTANEOUS | Status: DC
  Administered 2019-04-27 – 2019-04-28 (×2): 6 via TOPICAL

## 2019-04-26 MED ORDER — POLYVINYL ALCOHOL 1.4 % OP SOLN
1.0000 [drp] | OPHTHALMIC | Status: DC | PRN
  Administered 2019-04-26 – 2019-04-28 (×2): 1 [drp] via OPHTHALMIC
  Filled 2019-04-26: qty 15

## 2019-04-26 NOTE — Progress Notes (Signed)
Pt c/o feeling sob,lungs still diminshed in bases, applied 2l [employer] for comfort, ra was 99%, pt noted to have rapid breathing " I just keep thinking about what that lady said about breakingall my ribs and I just understand" pt repositioned in bed with o2 in placed and breathing more controlled after allowing pt to verbalize feelings, no distress when nurse left room, instructed pt to  Call for any issue

## 2019-04-26 NOTE — Progress Notes (Signed)
[facility] PROGRESS NOTE  Chad Mcmillan OEV:[id number] DOB: [birth year] DOA: 04/25/2019 PCP: Binnie Rail, MD  Assessment/Plan:   1. Acute respiratory failure with hypoxia secondary to hypertensive heart and kidney disease with acute on chronic combined systolic and diastolic congestive heart failure and stage IV chronic kidney disease: CT of the chest revealed bilateral pleural effusions  consistent with a mild exacerbation of congestive heart failure. BNP 2964. Oxygen saturation level greater than 90% on room air at rest.  Volume status -1.5L this am after 2 doses Lasix IV. Eco 2/20 with EF 25% and mild left ventricular wall thickness, global hypokinesis. -continue with lasix IV -intake and output -daily weight -continue home BB -wean oxygen as able -mobilize  2.  Near syncope: likely related to above. Suspect near syncope may have been related to hypoxemia from increase in volume. No events on tele. n signs infection. No metabolic derangements Reports "unsteady" with ambulation -obtain orthostatic vital signs -PT eval  3.  Essential hypertension: Fair control. Home meds include lasix, isosorbide-hydralazine, metoprolol -lasix IV as noted above -continue home med -monitor closely  4.  Coronary artery disease: Noted. Denies pain. Continue home medications.  5.  Generalized weakness  with bilateral lower extremity weakness: likely deconditioning in above setting -PT -check TSH -mobilize  6.  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 creatinine 2.6 which appears to be close to baseline.  - Continue home medication regimen -sliding scale insulin - avoid nephrotoxic agents - continue eye care as at home.  7.  Dyslipidemia: Continue home medication management.  8.  History of stroke: Noted. -PT eval  9.  Nonalcoholic steatohepatitis: Noted monitor liver function closely patient without any evidence of ascites or encephalopathy.  10.   Pancytopenia: Likely related to NASH.appears stable.  Monitor closely.  11.  Dementia: very mild.  Noted continue to monitor.  12.  History of paroxysmal atrial fibrillation. SR per ekg. No anticoag due to hx anemia  ##13.  Mass of left lung: Concerning for frontal line malignancy or adenocarcinoma.  Would discuss with patient and family prior to pursuing further work-up given his advanced age. Per Ct report "Persistent heterogeneous masslike opacity over the left lower lobe with areas of fat density measuring 4.2 x 5.9 cm. This masslike opacity persists despite resolution of the previously seen diffuse bilateral nodular airspace process. This raises possibility frontal lying malignancy/adenocarcinoma. No evidence of adenopathy. Consider PET-CT and pulmonary/thoracic surgical consultation for further evaluation."  14.  Son requesting palliative care eval for help at home. Spoke to patient regarding code status. He does not want compressions or intubation but does want "everything else".    Code Status: partial Family Communication: [name] (son) cell phone [phone] Disposition Plan: home hopefully tomorrow   Consultants:  none  Procedures:  none  Antibiotics:    HPI/Subjective: Sitting up in bed. Reports sleeping well and breathing better.   Admitted 6/7 with acute respiratory failure related to chf exacerbation. Incidental finding of lung mass on CT. Lasix provided and diuressing well.   Objective: Vitals:   04/26/19 0515 04/26/19 0809  BP: (!) 164/65 (!) 146/52  Pulse: (!) 58 60  Resp: 18 17  Temp: 98.4 F (36.9 C) 97.8 F (36.6 C)  SpO2: 99% 100%    Intake/Output Summary (Last 24 hours) at 04/26/2019 1016 Last data filed at 04/26/2019 7893 Gross per 24 hour  Intake 846 ml  Output 1151 ml  Net -305 ml   [employer]  04/25/19 1517 04/25/19 1608 04/26/19 0515  Weight: 66.8 kg 66.8 kg 66.8 kg    Exam:   General:  Awake alert in no acute  distress  Cardiovascular: rrr no mgr HS distant no LE edema  Respiratory: normal effort air flow fair. No crackles no wheeze  Abdomen: non-distended non-tender+ BS no guarding or rebounding  Musculoskeletal: joints without swelling/erythema   Data Reviewed: Basic Metabolic Panel: Recent Labs  Lab 04/25/19 1019 04/26/19 0540  NA 143 140  K 4.1 4.4  CL 104 102  CO2 29 28  GLUCOSE 165* 152*  BUN 61* 63*  CREATININE 2.50* 2.64*  CALCIUM 8.6* 8.4*   Liver Function Tests: Recent Labs  Lab 04/25/19 1019  AST 27  ALT 22  ALKPHOS 123  BILITOT 1.0  PROT 6.5  ALBUMIN 2.7*   No results for input(s): LIPASE, AMYLASE in the last 168 hours. No results for input(s): AMMONIA in the last 168 hours. CBC: Recent Labs  Lab 04/25/19 1019  WBC 5.6  NEUTROABS 4.4  HGB 8.3*  HCT 27.2*  MCV 102.3*  PLT 106*   Cardiac Enzymes: No results for input(s): CKTOTAL, CKMB, CKMBINDEX, TROPONINI in the last 168 hours. BNP (last 3 results) Recent Labs    12/19/18 1028 04/25/19 1019  BNP 1,887.1* 2,964.3*    ProBNP (last 3 results) Recent Labs    12/02/18 1416 12/14/18 1415 12/29/18 1356  PROBNP 11,875* 2,511.0* 1,328.0*    CBG: Recent Labs  Lab 04/25/19 1009 04/25/19 1648 04/25/19 2116 04/26/19 0633  GLUCAP 134* 147* 224* 132*    Recent Results (from the past 240 hour(s))  Urine culture     Status: Abnormal   Collection Time: 04/25/19  9:51 AM  Result Value Ref Range Status   Specimen Description URINE, RANDOM  Final   Special Requests NONE  Final   Culture (A)  Final    <10,000 COLONIES/mL INSIGNIFICANT GROWTH Performed at [facility], [place] [address] [place], [place] [address]    Report Status 04/26/2019 FINAL  Final  SARS Coronavirus 2 (CEPHEID- Performed in [place] hospital lab), Hosp Order     Status: None   Collection Time: 04/25/19 12:57 PM  Result Value Ref Range Status   SARS Coronavirus 2 NEGATIVE NEGATIVE Final    Comment: (NOTE) If  result is NEGATIVE SARS-CoV-2 target nucleic acids are NOT DETECTED. The SARS-CoV-2 RNA is generally detectable in upper and lower  respiratory specimens during the acute phase of infection. The lowest  concentration of SARS-CoV-2 viral copies this assay can detect is 250  copies / mL. A negative result does not preclude SARS-CoV-2 infection  and should not be used as the sole basis for treatment or other  patient management decisions.  A negative result may occur with  improper specimen collection / handling, submission of specimen other  than nasopharyngeal swab, presence of viral mutation(s) within the  areas targeted by this assay, and inadequate number of viral copies  (<250 copies / mL). A negative result must be combined with clinical  observations, patient history, and epidemiological information. If result is POSITIVE SARS-CoV-2 target nucleic acids are DETECTED. The SARS-CoV-2 RNA is generally detectable in upper and lower  respiratory specimens dur ing the acute phase of infection.  Positive  results are indicative of active infection with SARS-CoV-2.  Clinical  correlation with patient history and other diagnostic information is  necessary to determine patient infection status.  Positive results do  not rule out bacterial infection or  co-infection with other viruses. If result is PRESUMPTIVE POSTIVE SARS-CoV-2 nucleic acids MAY BE PRESENT.   A presumptive positive result was obtained on the submitted specimen  and confirmed on repeat testing.  While 2019 novel coronavirus  (SARS-CoV-2) nucleic acids may be present in the submitted sample  additional confirmatory testing may be necessary for epidemiological  and / or clinical management purposes  to differentiate between  SARS-CoV-2 and other Sarbecovirus currently known to infect humans.  If clinically indicated additional testing with an alternate test  methodology [phone]) is advised. The SARS-CoV-2 RNA is generally   detectable in upper and lower respiratory sp ecimens during the acute  phase of infection. The expected result is Negative. Fact Sheet for Patients:  StrictlyIdeas.no Fact Sheet for Healthcare Providers: BankingDealers.co.za This test is not yet approved or cleared by the [place] FDA and has been authorized for detection and/or diagnosis of SARS-CoV-2 by FDA under an Emergency Use Authorization (EUA).  This EUA will remain in effect (meaning this test can be used) for the duration of the COVID-19 declaration under Section 564(b)(1) of the Act, 21 U.S.C. section 360bbb-3(b)(1), unless the authorization is terminated or revoked sooner. Performed at [facility], [place] [address], [place], [place] [address]      Studies: Ct Head Wo Contrast  Result Date: 04/25/2019 CLINICAL DATA:  Syncopal episode.  Multiple falls. EXAM: CT HEAD WITHOUT CONTRAST CT CERVICAL SPINE WITHOUT CONTRAST TECHNIQUE: Multidetector CT imaging of the head and cervical spine was performed following the standard protocol without intravenous contrast. Multiplanar CT image reconstructions of the cervical spine were also generated. COMPARISON:  Brain CT 07/17/2018. FINDINGS: CT HEAD FINDINGS Brain: Ventricles and sulci are prominent compatible with atrophy. Periventricular and subcortical white matter hypodensity compatible with chronic microvascular ischemic changes. No evidence for acute cortically based infarct, intracranial hemorrhage, mass lesion mass-effect. Vascular: Unremarkable Skull: Intact. Sinuses/Orbits: Paranasal sinuses are well aerated. Focal soft tissue and mucosal thickening within the right maxillary sinus (image 6; series 4). Orbits are unremarkable. Other: None. CT CERVICAL SPINE FINDINGS Alignment: Normal anatomic alignment. Skull base and vertebrae: No acute fracture. No primary bone lesion or focal pathologic process. Soft tissues and spinal canal: No  prevertebral fluid or swelling. No visible canal hematoma. Disc levels: Degenerative disc disease most pronounced C3-4 and C6-7. No evidence for acute fracture. Upper chest: Unremarkable. Other: Bilateral carotid arterial vascular calcifications. IMPRESSION: No acute intracranial process. Atrophy and chronic microvascular ischemic changes. No acute cervical spine fracture.  Degenerative disc disease. Soft tissue density material within the right maxillary sinus is nonspecific however may represent sequelae of chronic sinusitis. Recommend clinical correlation. Electronically Signed   By: Lovey Newcomer M.D.   On: 04/25/2019 11:34   Ct Chest Wo Contrast  Result Date: 04/25/2019 CLINICAL DATA:  Hypoxia plan standing.  Increased weakness. EXAM: CT CHEST WITHOUT CONTRAST TECHNIQUE: Multidetector CT imaging of the chest was performed following the standard protocol without IV contrast. COMPARISON:  02/27/2018 and chest x-ray 04/25/2019 FINDINGS: Cardiovascular: Mild cardiomegaly. Calcified plaque over the left main and 3 vessel coronary arteries. Calcified plaque over the thoracic aorta. Remaining vascular structures are unremarkable. Mediastinum/Nodes: No significant mediastinal or hilar adenopathy. No axillary adenopathy. Remaining mediastinal structures are unremarkable. Lungs/Pleura: Lungs are adequately inflated as there has been interval resolution of the previously seen bilateral nodular airspace process, although there is persistence of masslike heterogeneous density over the left lower lobe measuring 4.2 x 5.9 cm. The medial half of this masslike density has Hounsfield  unit measurements of fat density as the lateral aspect may be due in part to compressive atelectasis. Small to moderate bilateral pleural effusions left greater than right. Stable nodular scarring over the right middle lobe. Aspirate material over the dependent portion of the upper trachea. Upper Abdomen: No acute findings. Calcified plaque over  the abdominal aorta. Musculoskeletal: Degenerative change of the spine. IMPRESSION: Mild to moderate size bilateral pleural effusions left greater than right. Persistent heterogeneous masslike opacity over the left lower lobe with areas of fat density measuring 4.2 x 5.9 cm. This masslike opacity persists despite resolution of the previously seen diffuse bilateral nodular airspace process. This raises possibility frontal lying malignancy/adenocarcinoma. No evidence of adenopathy. Consider PET-CT and pulmonary/thoracic surgical consultation for further evaluation. Stable nodular scarring over the right middle lobe. Aspirate material over the dependent portion of the upper trachea. Mild cardiomegaly. Left main and 3 vessel atherosclerotic coronary artery disease. Aortic Atherosclerosis (ICD10-I70.0). Electronically Signed   By: Marin Olp M.D.   On: 04/25/2019 14:35   Ct Cervical Spine Wo Contrast  Result Date: 04/25/2019 CLINICAL DATA:  Syncopal episode.  Multiple falls. EXAM: CT HEAD WITHOUT CONTRAST CT CERVICAL SPINE WITHOUT CONTRAST TECHNIQUE: Multidetector CT imaging of the head and cervical spine was performed following the standard protocol without intravenous contrast. Multiplanar CT image reconstructions of the cervical spine were also generated. COMPARISON:  Brain CT 07/17/2018. FINDINGS: CT HEAD FINDINGS Brain: Ventricles and sulci are prominent compatible with atrophy. Periventricular and subcortical white matter hypodensity compatible with chronic microvascular ischemic changes. No evidence for acute cortically based infarct, intracranial hemorrhage, mass lesion mass-effect. Vascular: Unremarkable Skull: Intact. Sinuses/Orbits: Paranasal sinuses are well aerated. Focal soft tissue and mucosal thickening within the right maxillary sinus (image 6; series 4). Orbits are unremarkable. Other: None. CT CERVICAL SPINE FINDINGS Alignment: Normal anatomic alignment. Skull base and vertebrae: No acute  fracture. No primary bone lesion or focal pathologic process. Soft tissues and spinal canal: No prevertebral fluid or swelling. No visible canal hematoma. Disc levels: Degenerative disc disease most pronounced C3-4 and C6-7. No evidence for acute fracture. Upper chest: Unremarkable. Other: Bilateral carotid arterial vascular calcifications. IMPRESSION: No acute intracranial process. Atrophy and chronic microvascular ischemic changes. No acute cervical spine fracture.  Degenerative disc disease. Soft tissue density material within the right maxillary sinus is nonspecific however may represent sequelae of chronic sinusitis. Recommend clinical correlation. Electronically Signed   By: Lovey Newcomer M.D.   On: 04/25/2019 11:34   Dg Chest Portable 1 View  Result Date: 04/25/2019 CLINICAL DATA:  Syncopal episode. EXAM: PORTABLE CHEST 1 VIEW COMPARISON:  Chest radiograph 12/19/2018. FINDINGS: Monitoring leads overlie the patient. Stable cardiomegaly. Aortic atherosclerosis. Moderate bilateral pleural effusions with underlying bibasilar consolidation. No pneumothorax. IMPRESSION: Cardiomegaly, bilateral pleural effusions and underlying opacities which may represent atelectasis, edema and/or infection. Electronically Signed   By: Lovey Newcomer M.D.   On: 04/25/2019 10:37    Scheduled Meds: . aspirin EC  81 mg Oral Daily  . atorvastatin  80 mg Oral q1800  . donepezil  5 mg Oral QHS  . ferrous gluconate  324 mg Oral Daily  . furosemide  60 mg Intravenous BID  . heparin  5,000 Units Subcutaneous Q8H  . insulin aspart  0-15 Units Subcutaneous TID WC  . isosorbide-hydrALAZINE  1 tablet Oral TID  . metoprolol tartrate  25 mg Oral BID  . polyethylene glycol  17 g Oral Daily  . potassium chloride  20 mEq Oral Daily  . repaglinide  0.5 mg Oral BID AC  . sodium chloride flush  3 mL Intravenous Q12H  . cyanocobalamin  1,000 mcg Oral Daily   Continuous Infusions: . 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, password [facility] 04/26/2019, 10:16 AM  LOS: 0 days

## 2019-04-26 NOTE — Telephone Encounter (Signed)
Called and spoke with pt's son to review Pt currently in hospital   CT scan susp for lung cancer Given pt's age and his other medical problems I agree with plans for palliative care Will follow post discharge for volume, work with palliative service with lasix (again for comfort) Arrange that pt has televisit in next week

## 2019-04-26 NOTE — Telephone Encounter (Signed)
New Message      Chad Mcmillan is calling to let Dr Harrington Challenger know Pt has been admitted for Fluid and a possible spot on his lungs may be cancer

## 2019-04-26 NOTE — Evaluation (Signed)
Physical Therapy Evaluation Patient Details Name: Chad Mcmillan MRN: [id number] DOB: 06/13/1924 Today's Date: 04/26/2019   History of Present Illness  Pt is a 85 y.o. 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 He presented to the ED complaining of increasing weakness, falls and shortness of breath.     Clinical Impression  Pt admitted with above diagnosis. Pt currently with functional limitations due to the deficits listed below (see PT Problem List). PTA pt lived at home with his wife. He was independent with ADLs and ambulated with RW. On eval he required min assist bed mobility and min assist transfers. Mobility limited by weakness and orthostatic hypotension. Pt with c/o dizziness and SOB upon sitting and worsening upon standing. Orthostatic BP as follows: sup 162/55, sitting 140/54, and initial stand 119/47. Pt unable to tolerate standing 3 minutes for additional BP reading. Pt will benefit from skilled PT to increase their independence and safety with mobility to allow discharge to the venue listed below.  Pt agreeable to considering SNF with his preference being [facility]. Although he is accepting of PT recommendation for SNF, he states he is hopeful to instead go home with home health.      Follow Up Recommendations SNF    Equipment Recommendations  None recommended by PT    Recommendations for Other Services       Precautions / Restrictions Precautions Precautions: Fall;Other (comment) Precaution Comments: orthostatic      Mobility  Bed Mobility Overal bed mobility: Needs Assistance Bed Mobility: Supine to Sit     Supine to sit: HOB elevated;Min assist     General bed mobility comments: +rail, increased time and effort  Transfers Overall transfer level: Needs assistance Equipment  used: Rolling walker (2 wheeled) Transfers: Sit to/from [facility] Sit to Stand: Min assist Stand pivot transfers: Min assist       General transfer comment: cues for hand placement. Assist to power up and stabilize balance.   Ambulation/Gait             General Gait Details: unable due to orthostatic  Stairs            Wheelchair Mobility    Modified Rankin (Stroke Patients Only)       Balance Overall balance assessment: Needs assistance Sitting-balance support: No upper extremity supported;Feet supported Sitting balance-Leahy Scale: Good     Standing balance support: Bilateral upper extremity supported;During functional activity Standing balance-Leahy Scale: Poor Standing balance comment: reliant on UE support                             Pertinent Vitals/Pain Pain Assessment: No/denies pain    Home Living Family/patient expects to be discharged to:: Private residence Living Arrangements: Spouse/significant other Available Help at Discharge: Family;Available 24 hours/day Type of Home: House Home Access: Ramped entrance;Stairs to enter Entrance Stairs-Rails: None Entrance Stairs-Number of Steps: 1 + 2 Home Layout: One level Home Equipment: Walker - 4 wheels;Cane - single point;Walker - 2 wheels;Wheelchair - manual;Toilet riser;Grab bars - toilet;Shower seat Additional Comments: Pt reports he is the primary caregiver for his wife (who has dementia) and has help from his two sons.     Prior Function Level of Independence: Independent with assistive device(s)  Comments: Uses RW for ambulation. Has aide come in 5 days per week for 4 hrs per day to help with IADLs- cooking. cleaning.      Hand Dominance   Dominant Hand: Right    Extremity/Trunk Assessment   Upper Extremity Assessment Upper Extremity Assessment: LUE deficits/detail LUE Deficits / Details: humeral deformity due to previous fx and failed sx  repair    Lower Extremity Assessment Lower Extremity Assessment: Generalized weakness    Cervical / Trunk Assessment Cervical / Trunk Assessment: Kyphotic  Communication   Communication: HOH  Cognition Arousal/Alertness: Awake/alert Behavior During Therapy: WFL for tasks assessed/performed Overall Cognitive Status: Within Functional Limits for tasks assessed                                        General Comments General comments (skin integrity, edema, etc.): Orthostatic BP: supine 162/55, sitting 140/54, initial stance 119/47. Pt unable to tolerate standing 3 minutes for additional BP reading.    Exercises     Assessment/Plan    PT Assessment Patient needs continued PT services  PT Problem List Decreased strength;Decreased balance;Decreased mobility;Cardiopulmonary status limiting activity;Decreased activity tolerance       PT Treatment Interventions Functional mobility training;Balance training;Patient/family education;Gait training;Therapeutic activities;Therapeutic exercise;Stair training    PT Goals (Current goals can be found in the Care Plan section)  Acute Rehab PT Goals Patient Stated Goal: get stronger and go home PT Goal Formulation: With patient Time For Goal Achievement: 05/10/19 Potential to Achieve Goals: Good    Frequency Min 3X/week   Barriers to discharge        Co-evaluation               AM-PAC PT "6 Clicks" Mobility  Outcome Measure Help needed turning from your back to your side while in a flat bed without using bedrails?: A Little Help needed moving from lying on your back to sitting on the side of a flat bed without using bedrails?: A Lot Help needed moving to and from a bed to a chair (including a wheelchair)?: A Little Help needed standing up from a chair using your arms (e.g., wheelchair or bedside chair)?: A Little Help needed to walk in hospital room?: A Lot Help needed climbing 3-5 steps with a railing? : A  Lot 6 Click Score: 15    End of Session Equipment Utilized During Treatment: Gait belt Activity Tolerance: Treatment limited secondary to medical complications (Comment)(orthostatic) Patient left: in chair;with call bell/phone within reach Nurse Communication: Mobility status;Other (comment)(orthostatic BP) PT Visit Diagnosis: Muscle weakness (generalized) (M62.81);Repeated falls (R29.6);Difficulty in walking, not elsewhere classified (R26.2);Dizziness and giddiness (R42)    Time: 0938-1829 PT Time Calculation (min) (ACUTE ONLY): 26 min   Charges:   PT Evaluation $PT Eval Moderate Complexity: 1 Mod PT Treatments $Therapeutic Activity: 8-22 mins        Lorrin Goodell, PT  Office # [phone] Pager [phone]   Lorriane Shire 04/26/2019, 12:28 PM

## 2019-04-26 NOTE — Progress Notes (Signed)
Nurse went to place dnr band and clarify " that's a tricky questions and I think that is hard to ask" pt very unsure andwas not willing to make final decision" spoke to Dr Eliseo Squires and says palliative is consulted and will clariy. Informed pt and verbalied understanding

## 2019-04-26 NOTE — Consult Note (Signed)
Consultation Note Date: 04/26/2019   Patient Name: Chad Mcmillan  DOB: [birth year]  MRN: [id number]  Age / Sex: 85 y.o., male  PCP: Binnie Rail, MD Referring Physician: Geradine Girt, DO  Reason for Consultation: Establishing goals of care  HPI/Patient Profile: 85 y.o. male  with past medical history of CHF, CKD 4, near syncope, HTN, CAD, weakness, DM2 w nephropathy, diabetic retinopathy, lung mass, DLD, hx CVA, NASH, pancytopenia, dementia, PAF not on AC w RVR, bilat pleural effusions, HTN admitted on 04/25/2019 with acute respiratory failure 2/2 HTN heart and CKD.   Clinical Assessment and Goals of Care: Chad Mcmillan is lying quietly in bed.  He will make and mostly keep eye contact.  He appears chronically ill and frail.  He is able to tell me his name, and at first thinks we are in a SNF.  We talk about his current health issues and he is unable to tell me why he is in the hospital.  As our conversation progresses, Chad Mcmillan becomes more mistrustful. I reassure him and leave.  There is no family at bedside at this time due to visitor restrictions.   Call to son Chad Mcmillan.  We discuss orthostatic hypotension, HF, and lung mass. Chad Mcmillan tells me that he and his brother share caring for their parents.  Chad Mcmillan shares that Chad Mcmillan has been stable with weight 145# over last 12 month, but had lost about 75# in last year, he was not eating as much.  Chad Mcmillan shares that he has seen a big decline in his fathers functional status over the last 4 weeks.    Chad Mcmillan asks about rehab and shares that Chad Mcmillan has been at [facility] in the past, and is their choice of facility.  Agreeable to out patient palliative care. We talk about how much testing/workup for mass. Chad Mcmillan shares that PCP has also suggested no workup.   We plan for a conference call to review MOST form tomorrow in the morning with Chad Mcmillan and Chad Mcmillan.   HCPOA   HCPOA - both sons,  Chad Mcmillan.  Married 38 years.    SUMMARY OF RECOMMENDATIONS   Rehab if qualified, [facility] preference Out patient palliative to follow Considering no work up for lung mass.   Code Status/Advance Care Planning:  Limited code - Code status not discussed with patient dt advanced age, no family present at bedside, and known memory loss.   Symptom Management:   Per hospitalist, no additional needs at this time.   Palliative Prophylaxis:   Frequent Pain Assessment  Additional Recommendations (Limitations, Scope, Preferences):  treat the treatable, no cpr, no intubation.   Psycho-social/Spiritual:   Desire for further Chaplaincy support:no  Additional Recommendations: Caregiving  Support/Resources and Education on Hospice  Prognosis:   Unable to determine, based on outcomes.  6 months or less would not be surprising based on advanced age, 2 hospitalizations in 6 months, memory loss, possible lung mass.   Discharge Planning: to be determined, based on outcomes, patient/family choice.  Primary Diagnoses: Present on Admission: . Hypertensive heart and kidney disease with acute on chronic combined systolic and diastolic congestive heart failure and stage 4 chronic kidney disease (Chad Mcmillan) . Near syncope . Bilateral leg weakness . Dyslipidemia . Essential hypertension . CAD (coronary artery disease) . NASH (nonalcoholic steatohepatitis) . Pancytopenia (Chad Mcmillan) . Dementia (Chad Mcmillan) . Paroxysmal atrial fibrillation with RVR (Chad Mcmillan) . Type 2 diabetes mellitus with nephropathy (Chad Mcmillan) . Diabetic retinopathy (Chad Mcmillan) . Acute on chronic combined systolic (congestive) and diastolic (congestive) heart failure (Chad Mcmillan) . Acute on chronic combined systolic and diastolic CHF (congestive heart failure) (Chad Mcmillan)   I have reviewed the medical record, interviewed the patient and family, and examined the patient. The following aspects are pertinent.  Past Medical History:  Diagnosis  Date  . ADENOCARCINOMA, PROSTATE 06/01/2008  . CORONARY ARTERY DISEASE 06/13/2007  . CVA 06/01/2008   x 2  . DIABETES MELLITUS, TYPE I, WITH OPHTHALMIC COMPLICATIONS 1/61/0960  . DM retinopathy (Chad Mcmillan)   . HYPERKALEMIA 06/01/2008   due to DM neparopathy  . HYPERLIPIDEMIA 06/13/2007  . HYPERTENSION 06/13/2007  . NEOPLASM, MALIGNANT, SKIN, FACE 06/01/2008  . Orthostatic hypotension 12/19/2018  . Other chronic nonalcoholic liver disease 4/54/0981  . RENAL DISEASE 06/01/2008   Social History   Socioeconomic History  . Marital status: Married    Spouse name: Not on file  . Number of children: Not on file  . Years of education: Not on file  . Highest education level: Not on file  Occupational History  . Occupation: Retired    Fish farm manager: RETIRED  Social Needs  . Financial resource strain: Not on file  . Food insecurity:    Worry: Not on file    Inability: Not on file  . Transportation needs:    Medical: Not on file    Non-medical: Not on file  Tobacco Use  . Smoking status: Former Smoker    Last attempt to quit: 11/18/1978    Years since quitting: 40.4  . Smokeless tobacco: Never Used  Substance and Sexual Activity  . Alcohol use: No  . Drug use: No  . Sexual activity: Not Currently  Lifestyle  . Physical activity:    Days per week: Not on file    Minutes per session: Not on file  . Stress: Not on file  Relationships  . Social connections:    Talks on phone: Not on file    Gets together: Not on file    Attends religious service: Not on file    Active member of club or organization: Not on file    Attends meetings of clubs or organizations: Not on file    Relationship status: Not on file  Other Topics Concern  . Not on file  Social History Narrative   Lives with wife who also has dementia 2 sons very inviolved   Family History  Problem Relation Age of Onset  . Vision loss Son    Scheduled Meds: . aspirin EC  81 mg Oral Daily  . atorvastatin  80 mg Oral q1800  .  Chlorhexidine Gluconate Cloth  6 each Topical Q0600  . donepezil  5 mg Oral QHS  . ferrous gluconate  324 mg Oral Daily  . furosemide  60 mg Intravenous BID  . heparin  5,000 Units Subcutaneous Q8H  . insulin aspart  0-15 Units Subcutaneous TID WC  . isosorbide-hydrALAZINE  1 tablet Oral TID  . metoprolol tartrate  25 mg Oral BID  . mupirocin ointment  1 application Nasal BID  .  polyethylene glycol  17 g Oral Daily  . potassium chloride  20 mEq Oral Daily  . repaglinide  0.5 mg Oral BID AC  . sodium chloride flush  3 mL Intravenous Q12H  . cyanocobalamin  1,000 mcg Oral Daily   Continuous Infusions: . sodium chloride     PRN Meds:.sodium chloride, acetaminophen, LORazepam, nitroGLYCERIN, ondansetron (ZOFRAN) IV, polyvinyl alcohol, senna-docusate, sodium chloride flush Medications Prior to Admission:  Prior to Admission medications   Medication Sig Start Date End Date Taking? Authorizing Provider  aspirin EC 81 MG tablet Take 1 tablet (81 mg total) by mouth daily. 08/04/17  Yes [name], MD  atorvastatin (LIPITOR) 40 MG tablet TAKE 2 TABLETS BY MOUTH ONCE DAILY AT 6 IN THE EVENING Patient taking differently: Take 80 mg by mouth daily at 6 PM.  04/09/19  Yes Burns, Claudina Lick, MD  cyanocobalamin 1000 MCG tablet Take 1,000 mcg by mouth daily.   Yes [provider]  donepezil (ARICEPT) 5 MG tablet TAKE 1 TABLET BY MOUTH AT BEDTIME Patient taking differently: Take 5 mg by mouth at bedtime.  04/09/19  Yes Burns, Claudina Lick, MD  Ferrous Gluconate 324 (37.5 Fe) MG TABS Take 324 mg by mouth daily.    Yes [provider]  furosemide (LASIX) 40 MG tablet Take 40 mg by mouth every other day.    Yes [provider]  isosorbide-hydrALAZINE (BIDIL) 20-37.5 MG tablet Take 1 tablet by mouth 3 (three) times daily. 01/25/19  Yes [name], MD  LORazepam (ATIVAN) 0.5 MG tablet Take 1 tablet (0.5 mg total) by mouth at bedtime as needed for anxiety. 12/02/18  Yes Burns, Claudina Lick, MD   meclizine (ANTIVERT) 12.5 MG tablet TAKE 1 TABLET BY MOUTH THREE TIMES DAILY AS NEEDED FOR DIZZINESS Patient taking differently: Take 12.5 mg by mouth 3 (three) times daily as needed for dizziness.  06/30/18  Yes [name], MD  metoprolol tartrate (LOPRESSOR) 25 MG tablet Take 1 tablet (25 mg total) by mouth 2 (two) times daily. 01/25/19  Yes [name], MD  nitroGLYCERIN (NITROSTAT) 0.4 MG SL tablet Place 1 tablet (0.4 mg total) under the tongue every 5 (five) minutes as needed. For chest pain 08/04/17  Yes [name], MD  polyethylene glycol [facility] / GLYCOLAX) packet Take 17 g by mouth daily. 07/25/18  Yes Hongalgi, Lenis Dickinson, MD  repaglinide (PRANDIN) 0.5 MG tablet Take 1 tablet (0.5 mg total) by mouth 2 (two) times daily before a meal. 03/18/19  Yes [name], MD  senna-docusate (SENOKOT-S) 8.6-50 MG tablet Take 1 tablet by mouth at bedtime as needed for mild constipation or moderate constipation. 07/24/18  Yes Hongalgi, Lenis Dickinson, MD  Blood Glucose Monitoring Suppl (ONE TOUCH ULTRA 2) w/Device KIT 1 each by Does not apply route 2 (two) times a day. Use to monitor glucose levels BID; E11.9; NPI [id number] 03/29/19   [name], MD  glucose blood (ONE TOUCH ULTRA TEST) test strip Use 1 test strip to monitor glucose levels BID; E11.9; NPI [id number] 03/17/19   [name], MD  OneTouch Delica Lancets 05L MISC Use 1 lancet to monitor glucose levels BID; E11.9; NPI [id number] 03/17/19   [name], MD   No Known Allergies Review of Systems  Unable to perform ROS: Age    Physical Exam Vitals signs and nursing note reviewed.  HENT:     Head: Atraumatic.     Comments: Some temporal wasting Cardiovascular:     Rate and Rhythm:  Normal rate.  Pulmonary:     Effort: Pulmonary effort is normal. No respiratory distress.  Skin:    General: Skin is warm and dry.  Neurological:     Comments: Oriented to person and place   Psychiatric:     Comments: Mistrustful      Vital Signs: BP (!)  146/52 (BP Location: Left Arm)   Pulse 60   Temp 97.8 F (36.6 C) (Oral)   Resp 17   Ht 6' (1.829 m)   Wt 66.8 kg   SpO2 99%   BMI 19.96 kg/m  Pain Scale: 0-10   Pain Score: 0-No pain   SpO2: SpO2: 99 % O2 Device:SpO2: 99 % O2 Flow Rate: .O2 Flow Rate (L/min): 2 L/min  IO: Intake/output summary:   Intake/Output Summary (Last 24 hours) at 04/26/2019 1636 Last data filed at 04/26/2019 1500 Gross per 24 hour  Intake 846 ml  Output 1751 ml  Net -905 ml    LBM: Last BM Date: 04/26/19 Baseline Weight: Weight: 66.8 kg Most recent weight: Weight: 66.8 kg     Palliative Assessment/Data:   Flowsheet Rows     Most Recent Value  Intake Tab  Referral Department  Hospitalist  Unit at Time of Referral  Cardiac/Telemetry Unit  Palliative Care Primary Diagnosis  Pulmonary  Date Notified  04/25/19  Palliative Care Type  New Palliative care  Reason for referral  Clarify Goals of Care  Date of Admission  04/26/19  Date first seen by Palliative Care  04/26/19  # of days Palliative referral response time  1 Day(s)  # of days IP prior to Palliative referral  -1  Clinical Assessment  Palliative Performance Scale Score  40%  Pain Max last 24 hours  Not able to report  Pain Min Last 24 hours  Not able to report  Dyspnea Max Last 24 Hours  Not able to report  Dyspnea Min Last 24 hours  Not able to report  Psychosocial & Spiritual Assessment  Palliative Care Outcomes      Time In: 1620 Time Out: 1730 Time Total: 70 minutes  Greater than 50%  of this time was spent counseling and coordinating care related to the above assessment and plan.  Signed by: Drue Novel, NP   Please contact Palliative Medicine Team phone at [phone] for questions and concerns.  For individual provider: See Shea Evans

## 2019-04-27 ENCOUNTER — Telehealth: Payer: Self-pay | Admitting: Internal Medicine

## 2019-04-27 LAB — RETICULOCYTES
Immature Retic Fract: 6.1 % (ref 2.3–15.9)
RBC.: 2.24 MIL/uL — ABNORMAL LOW (ref 4.22–5.81)
Retic Count, Absolute: 41.4 10*3/uL (ref 19.0–186.0)
Retic Ct Pct: 1.8 % (ref 0.4–3.1)

## 2019-04-27 LAB — GLUCOSE, CAPILLARY
Glucose-Capillary: 100 mg/dL — ABNORMAL HIGH (ref 70–99)
Glucose-Capillary: 207 mg/dL — ABNORMAL HIGH (ref 70–99)
Glucose-Capillary: 99 mg/dL (ref 70–99)
Glucose-Capillary: 187 mg/dL — ABNORMAL HIGH (ref 70–99)

## 2019-04-27 LAB — BASIC METABOLIC PANEL
Anion gap: 8 (ref 5–15)
BUN: 68 mg/dL — ABNORMAL HIGH (ref 8–23)
CO2: 26 mmol/L (ref 22–32)
Calcium: 8 mg/dL — ABNORMAL LOW (ref 8.9–10.3)
Chloride: 103 mmol/L (ref 98–111)
Creatinine, Ser: 2.72 mg/dL — ABNORMAL HIGH (ref 0.61–1.24)
GFR calc Af Amer: 22 mL/min — ABNORMAL LOW (ref >60)
GFR calc non Af Amer: 19 mL/min — ABNORMAL LOW (ref >60)
Glucose, Bld: 115 mg/dL — ABNORMAL HIGH (ref 70–99)
Potassium: 4.4 mmol/L (ref 3.5–5.1)
Sodium: 137 mmol/L (ref 135–145)

## 2019-04-27 LAB — CBC
HCT: 22.4 % — ABNORMAL LOW (ref 39.0–52.0)
Hemoglobin: 7.1 g/dL — ABNORMAL LOW (ref 13.0–17.0)
MCH: 31.6 pg (ref 26.0–34.0)
MCHC: 31.7 g/dL (ref 30.0–36.0)
MCV: 99.6 fL (ref 80.0–100.0)
Platelets: 95 10*3/uL — ABNORMAL LOW (ref 150–400)
RBC: 2.25 MIL/uL — ABNORMAL LOW (ref 4.22–5.81)
RDW: 14.1 % (ref 11.5–15.5)
WBC: 5.7 10*3/uL (ref 4.0–10.5)
nRBC: 0 % (ref 0.0–0.2)

## 2019-04-27 LAB — TSH: TSH: 7.135 u[IU]/mL — ABNORMAL HIGH (ref 0.350–4.500)

## 2019-04-27 LAB — PREPARE RBC (CROSSMATCH)

## 2019-04-27 LAB — FOLATE: Folate: 7.2 ng/mL (ref >5.9)

## 2019-04-27 LAB — IRON AND TIBC
Iron: 42 ug/dL — ABNORMAL LOW (ref 45–182)
Saturation Ratios: 24 % (ref 17.9–39.5)
TIBC: 175 ug/dL — ABNORMAL LOW (ref 250–450)
UIBC: 133 ug/dL

## 2019-04-27 LAB — VITAMIN B12: Vitamin B-12: 750 pg/mL (ref 180–914)

## 2019-04-27 LAB — FERRITIN: Ferritin: 348 ng/mL — ABNORMAL HIGH (ref 24–336)

## 2019-04-27 MED ORDER — SODIUM CHLORIDE 0.9% IV SOLUTION
Freq: Once | INTRAVENOUS | Status: AC
  Administered 2019-04-27: 14:00:00 via INTRAVENOUS

## 2019-04-27 NOTE — ACP (Advance Care Planning) (Signed)
MOST form completed with Chad Mcmillan and Chad Mcmillan via phone Section A :  DNR Section B : Limited  Section C: limited use of antibiotics.  Section D Limited use of IV fluid,  NO PEG tube

## 2019-04-27 NOTE — Plan of Care (Signed)
  Problem: Clinical Measurements: Goal: Will remain free from infection Outcome: Progressing Note:  Pt is has not shown any signs of infection during my care.    Problem: Pain Managment: Goal: General experience of comfort will improve Outcome: Progressing Note:  Pt has had no complaints of pain during my care.   Problem: Activity: Goal: Capacity to carry out activities will improve Outcome: Not Progressing Note:  Pt was placed in the chair and has been there since around 10am. Pt was no able to ambulate this afternoon during my shift.

## 2019-04-27 NOTE — TOC Initial Note (Signed)
Transition of Care [facility]) - Initial/Assessment Note    Patient Details  Name: Chad Mcmillan MRN: [id number] Date of Birth: 1924/09/05  [facility] CM/SW Contact:    Candie Chroman, LCSW Phone Number: 04/27/2019, 9:19 AM  Clinical Narrative:  Readmission prevention screening complete.  CSW met with patient, introduced role, and explained that PT recommendations would be discussed. Patient is agreeable to SNF and confirmed that [facility] is his first preference. Reviewed CMS Medicare scores for this facility and he is still agreeable. Patient was last there about 8 months ago. Sent referral and left message for admissions coordinator. Patient hopes he will qualify for oxygen when he returns home. He stated it was "the best thing that has happened to me in 6 months." Patient reports periodically feeling short of breath at home. He already has a walker and cane at home. Patient gave CSW permission to call his son Louie Casa to notify him of our conversation. Gave son CMS Medicare scores for [facility] and he is still agreeable as well. No further concerns. CSW encouraged patient to contact CSW as needed. CSW will continue to follow patient and his son for support and facilitate discharge to SNF once medically stable. Patient will need COVID test results within 48 hours of discharge.      Expected Discharge Plan: Skilled Nursing Facility Barriers to Discharge: Continued Medical Work up   Patient Goals and CMS Choice Patient states their goals for this hospitalization and ongoing recovery are:: "I hope I can go home on oxygen. This is the best thing that's happened to me in 6 months." CMS Medicare.gov Compare Post Acute Care list provided to:: Patient(Gave son scores for [facility] over the phone.)    Expected Discharge Plan and Services Expected Discharge Plan: [place] Acute Care Choice: [place] arrangements for the past 2 months:  Single Family Home                                      Prior Living Arrangements/Services Living arrangements for the past 2 months: Single Family Home Lives with:: Spouse Patient language and need for interpreter reviewed:: Yes(No needs) Do you feel safe going back to the place where you live?: Yes      Need for Family Participation in Patient Care: Yes (Comment) Care giver support system in place?: Yes (comment) Current home services: DME Criminal Activity/Legal Involvement Pertinent to Current Situation/Hospitalization: No - Comment as needed  Activities of Daily Living Home Assistive Devices/Equipment: Environmental consultant (specify type), Cane (specify quad or straight), Wheelchair, CBG Meter ADL Screening (condition at time of admission) Patient's cognitive ability adequate to safely complete daily activities?: Yes Is the patient deaf or have difficulty hearing?: Yes Does the patient have difficulty seeing, even when wearing glasses/contacts?: No Does the patient have difficulty concentrating, remembering, or making decisions?: Yes Patient able to express need for assistance with ADLs?: Yes Does the patient have difficulty dressing or bathing?: Yes Independently performs ADLs?: No Communication: Independent Dressing (OT): Needs assistance Is this a change from baseline?: Pre-admission baseline Grooming: Needs assistance Is this a change from baseline?: Pre-admission baseline Feeding: Independent Bathing: Needs assistance Is this a change from baseline?: Pre-admission baseline Toileting: Needs assistance Is this a change from baseline?: Pre-admission baseline In/Out Bed: Needs assistance Is this a change from baseline?: Pre-admission baseline Walks in Home: Needs assistance Is this  a change from baseline?: Pre-admission baseline Does the patient have difficulty walking or climbing stairs?: Yes Weakness of Legs: Both Weakness of Arms/Hands: Both  Permission  Sought/Granted Permission sought to share information with : Facility Sport and exercise psychologist, Family Supports Permission granted to share information with : Yes, Verbal Permission Granted  Share Information with NAME: Furkan Keenum  Permission granted to share info w AGENCY: [facility]  Permission granted to share info w Relationship: Son/Primary HCPOA  Permission granted to share info w Contact Information: [phone]  Emotional Assessment Appearance:: Appears stated age Attitude/Demeanor/Rapport: Engaged, Gracious Affect (typically observed): Accepting, Appropriate, Calm, Pleasant Orientation: : Oriented to Self, Oriented to Place, Oriented to  Time, Oriented to Situation Alcohol / Substance Use: Never Used Psych Involvement: No (comment)  Admission diagnosis:  Syncope and collapse [R55] Acute respiratory failure with hypoxia ([place]) [J96.01] Patient Active Problem List   Diagnosis Date Noted  . Acute respiratory failure with hypoxia ([place]) 04/26/2019  . Acute on chronic combined systolic and diastolic CHF (congestive heart failure) ([place]) 04/26/2019  . DNR (do not resuscitate) discussion   . 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 Near syncope 04/25/2019  . Bilateral leg weakness 04/25/2019  . Diabetic retinopathy ([place]) 04/25/2019  . Mass of left lung 04/25/2019  . Acute on chronic combined systolic (congestive) and diastolic (congestive) heart failure ([place]) 04/25/2019  . Type 2 diabetes mellitus with nephropathy ([place]) 03/17/2019  . Physical deconditioning 12/29/2018  . Acute combined systolic and diastolic heart failure ([place])   . Orthostatic hypotension 12/19/2018  . Acute on chronic diastolic (congestive) heart failure ([place]) 12/19/2018  . Congestive heart failure (CHF) ([place]) 12/19/2018  . Pressure injury of skin 12/19/2018  . Vitamin B12  deficiency 12/13/2018  . Vitamin D deficiency 12/13/2018  . Paroxysmal atrial fibrillation with RVR ([place])   . Chronic diastolic heart failure ([place])   . S/P CABG (coronary artery bypass graft)   . Closed fracture of acromial end of left clavicle 07/19/2018    Class: Acute  . Closed displaced fracture of greater trochanter of right femur ([place]) 07/17/2018  . History of thrombocytopenia 07/17/2018  . Hypocalcemia 02/12/2018  . Agitation   . Dementia ([place])   . Palliative care by specialist   . CKD (chronic kidney disease), stage IV ([place]) 01/14/2018  . Inguinal hernia of right side without obstruction or gangrene 01/12/2018  . Vertigo 01/07/2018  . LBBB (left bundle branch block) 01/07/2018  . Generalized weakness 01/07/2018  . Foot pain, bilateral 10/07/2017  . Dizzy 01/31/2016  . Pancytopenia ([place]) 07/27/2012  . Goals of care, counseling/discussion 07/27/2012  . Anemia 07/27/2012  . NASH (nonalcoholic steatohepatitis) 07/17/2010  . ADENOCARCINOMA, PROSTATE 06/01/2008  . Hyperkalemia 06/01/2008  . History of stroke 06/01/2008  . NEOPLASM, MALIGNANT, SKIN, FACE 06/01/2008  . Dyslipidemia 06/13/2007  . Essential hypertension 06/13/2007  . CAD (coronary artery disease) 06/13/2007   PCP:  Binnie Rail, MD Pharmacy:   [facility] [address], [place] - [address] [place] [place] [address] Phone: [phone] Fax: [phone]     Social Determinants of Health (SDOH) Interventions    Readmission Risk Interventions Readmission Risk Prevention Plan 04/27/2019  Transportation Screening Complete  PCP or Specialist appointment within 3-5 days of discharge Complete  SW Recovery Care/Counseling Consult Complete  Palliative Care Screening Complete  Skilled Nursing Facility Complete  Some recent data might be  hidden

## 2019-04-27 NOTE — NC FL2 (Signed)
[place] LEVEL OF CARE SCREENING TOOL     IDENTIFICATION  Patient Name: Chad Mcmillan Birthdate: 13-Apr-1924 Sex: male Admission Date (Current Location): 04/25/2019  [facility] Number:  Herbalist and Address:  [facility], [place] [address], [place], [place] [address]      Provider Number: [id number]  Attending Physician Name and Address:  Geradine Girt, DO  Relative Name and Phone Number:       Current Level of Care: Hospital Recommended Level of Care: [place] Prior Approval Number:    Date Approved/Denied:   PASRR Number: [id number]  Discharge Plan: SNF    Current Diagnoses: Patient Active Problem List   Diagnosis Date Noted  . Acute respiratory failure with hypoxia ([place]) 04/26/2019  . Acute on chronic combined systolic and diastolic CHF (congestive heart failure) ([place]) 04/26/2019  . DNR (do not resuscitate) discussion   . 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 Near syncope 04/25/2019  . Bilateral leg weakness 04/25/2019  . Diabetic retinopathy ([place]) 04/25/2019  . Mass of left lung 04/25/2019  . Acute on chronic combined systolic (congestive) and diastolic (congestive) heart failure ([place]) 04/25/2019  . Type 2 diabetes mellitus with nephropathy ([place]) 03/17/2019  . Physical deconditioning 12/29/2018  . Acute combined systolic and diastolic heart failure ([place])   . Orthostatic hypotension 12/19/2018  . Acute on chronic diastolic (congestive) heart failure ([place]) 12/19/2018  . Congestive heart failure (CHF) ([place]) 12/19/2018  . Pressure injury of skin 12/19/2018  . Vitamin B12 deficiency 12/13/2018  . Vitamin D deficiency 12/13/2018  . Paroxysmal atrial fibrillation with RVR ([place])   . Chronic diastolic heart failure ([place])   . S/P CABG (coronary artery bypass graft)    . Closed fracture of acromial end of left clavicle 07/19/2018    Class: Acute  . Closed displaced fracture of greater trochanter of right femur ([place]) 07/17/2018  . History of thrombocytopenia 07/17/2018  . Hypocalcemia 02/12/2018  . Agitation   . Dementia ([place])   . Palliative care by specialist   . CKD (chronic kidney disease), stage IV ([place]) 01/14/2018  . Inguinal hernia of right side without obstruction or gangrene 01/12/2018  . Vertigo 01/07/2018  . LBBB (left bundle branch block) 01/07/2018  . Generalized weakness 01/07/2018  . Foot pain, bilateral 10/07/2017  . Dizzy 01/31/2016  . Pancytopenia ([place]) 07/27/2012  . Goals of care, counseling/discussion 07/27/2012  . Anemia 07/27/2012  . NASH (nonalcoholic steatohepatitis) 07/17/2010  . ADENOCARCINOMA, PROSTATE 06/01/2008  . Hyperkalemia 06/01/2008  . History of stroke 06/01/2008  . NEOPLASM, MALIGNANT, SKIN, FACE 06/01/2008  . Dyslipidemia 06/13/2007  . Essential hypertension 06/13/2007  . CAD (coronary artery disease) 06/13/2007    Orientation RESPIRATION BLADDER Height & Weight     Self, Time, Situation, Place  O2(Nasal Canula 2 L. Patient hopes he can qualify for home oxygen.) Continent, External catheter Weight: (Refused because of confusion.) Height:  6' (182.9 cm)  BEHAVIORAL SYMPTOMS/MOOD NEUROLOGICAL BOWEL NUTRITION STATUS  (None) (Dementia. History of stroke.) Continent Diet(Heart healthy/carb modified. Fluid restriction 1200 mL.)  AMBULATORY STATUS COMMUNICATION OF NEEDS Skin   Extensive Assist Verbally Skin abrasions, PU Stage and Appropriate Care, Other (Comment)(Diabetic ulcer on right posterior heel: No dressing.)   PU Stage 2 Dressing: (Mid left buttocks: Foam (lift dressing to assess site every shift) prn.)  Personal Care Assistance Level of Assistance              Functional Limitations Info  Sight, Hearing, Speech Sight Info: Adequate Hearing Info: Adequate Speech Info:  Adequate    SPECIAL CARE FACTORS FREQUENCY  PT (By licensed PT)     PT Frequency: 5 x week              Contractures Contractures Info: Not present    Additional Factors Info  Code Status, Allergies Code Status Info: Partial: No CPR, No Intubation/Mechanical Ventilation Allergies Info: NKDA           Current Medications (04/27/2019):  This is the current hospital active medication list Current Facility-Administered Medications  Medication Dose Route Frequency Provider Last Rate Last Dose  . 0.9 %  sodium chloride infusion  250 mL Intravenous PRN Lady Deutscher, MD      . acetaminophen (TYLENOL) tablet 650 mg  650 mg Oral Q4H PRN Lady Deutscher, MD      . aspirin EC tablet 81 mg  81 mg Oral Daily Lady Deutscher, MD   81 mg at 04/26/19 0820  . atorvastatin (LIPITOR) tablet 80 mg  80 mg Oral q1800 Lady Deutscher, MD   80 mg at 04/26/19 1801  . Chlorhexidine Gluconate Cloth 2 % PADS 6 each  6 each Topical Q0600 Geradine Girt, DO   6 each at 04/27/19 4098  . donepezil (ARICEPT) tablet 5 mg  5 mg Oral QHS Lady Deutscher, MD   5 mg at 04/26/19 2233  . ferrous gluconate (FERGON) tablet 324 mg  324 mg Oral Daily Lady Deutscher, MD   324 mg at 04/26/19 0820  . furosemide (LASIX) injection 60 mg  60 mg Intravenous BID Lady Deutscher, MD   60 mg at 04/27/19 1191  . heparin injection 5,000 Units  5,000 Units Subcutaneous Q8H Lady Deutscher, MD   5,000 Units at 04/27/19 [phone]  . insulin aspart (novoLOG) injection 0-15 Units  0-15 Units Subcutaneous TID WC Lady Deutscher, MD   2 Units at 04/26/19 1802  . isosorbide-hydrALAZINE (BIDIL) 20-37.5 MG per tablet 1 tablet  1 tablet Oral TID Lady Deutscher, MD   1 tablet at 04/26/19 2233  . LORazepam (ATIVAN) tablet 0.5 mg  0.5 mg Oral QHS PRN Lady Deutscher, MD      . metoprolol tartrate (LOPRESSOR) tablet 25 mg  25 mg Oral BID Lady Deutscher, MD   25 mg at 04/26/19 2231  . mupirocin ointment  (BACTROBAN) 2 % 1 application  1 application Nasal BID Geradine Girt, DO   1 application at 95/62/13 2234  . nitroGLYCERIN (NITROSTAT) SL tablet 0.4 mg  0.4 mg Sublingual Q5 min PRN Lady Deutscher, MD      . ondansetron [facility]) injection 4 mg  4 mg Intravenous Q6H PRN Lady Deutscher, MD      . polyethylene glycol (MIRALAX / GLYCOLAX) packet 17 g  17 g Oral Daily Lady Deutscher, MD      . polyvinyl alcohol (LIQUIFILM TEARS) 1.4 % ophthalmic solution 1 drop  1 drop Both Eyes PRN Eulogio Bear U, DO   1 drop at 04/26/19 0818  . potassium chloride SA (K-DUR) CR tablet 20 mEq  20 mEq Oral Daily Lady Deutscher, MD   20 mEq at 04/26/19 0865  . repaglinide (PRANDIN) tablet 0.5 mg  0.5 mg Oral BID AC Lady Deutscher, MD  0.5 mg at 04/27/19 0812  . senna-docusate (Senokot-S) tablet 1 tablet  1 tablet Oral QHS PRN Lady Deutscher, MD      . sodium chloride flush (NS) 0.9 % injection 3 mL  3 mL Intravenous Q12H Lady Deutscher, MD   3 mL at 04/26/19 2234  . sodium chloride flush (NS) 0.9 % injection 3 mL  3 mL Intravenous PRN Lady Deutscher, MD      . vitamin B-12 (CYANOCOBALAMIN) tablet 1,000 mcg  1,000 mcg Oral Daily Lady Deutscher, MD   1,000 mcg at 04/26/19 1460     Discharge Medications: Please see discharge summary for a list of discharge medications.  Relevant Imaging Results:  Relevant Lab Results:   Additional Information SS#: [id number]  Candie Chroman, LCSW

## 2019-04-27 NOTE — Telephone Encounter (Signed)
Copied from [place] [id number]. Topic: Quick Communication - Home Health Verbal Orders >> Apr 27, 2019  8:15 AM Jodie Echevaria wrote: Caller/Agency: April / [facility] Number: [phone] Requesting OT/PT/Skilled Nursing/Social Work/Speech Therapy: Skilled nursing and PT Frequency: SN 2 wk 5, 1 PRN PT evaluation

## 2019-04-27 NOTE — Progress Notes (Signed)
Unable to complete walking Oxygen assessment. Pt unable to walk. MD notified

## 2019-04-27 NOTE — Telephone Encounter (Signed)
LVM giving verbal orders per Dr. Burns.  

## 2019-04-27 NOTE — Progress Notes (Signed)
[facility] PROGRESS NOTE  Chad Mcmillan ERD:[id number] DOB: July 10, 1924 DOA: 04/25/2019 PCP: Binnie Rail, MD  Assessment/Plan:  1. Acute respiratory failure with hypoxia secondary to hypertensive heart and kidney disease with acute on chronic combined systolic and diastolic congestive heart failure and stage IV chronic kidney disease in setting of anemia:  CT of the chest revealed bilateral pleural effusions  consistent with a mild exacerbation of congestive heart failure. BNP 2964. Oxygen saturation level greater than 90% on room air at rest.  Volume status -2.0L.Echo 2/20 with EF 25% and mild left ventricular wall thickness, global hypokinesis. Improved this am. Oxygen saturation level 100% on 2L. Patient reports feeling better "with oxygen and I hope I get to keep it". Hg 7.1 after diuresing. likely contributing to shortness of breath -continue with lasix IV -intake and output -daily weight -continue home BB -wean oxygen  -anemia panel -fobt -mobilize  2. Near syncope: likely related to above. Quite orthostatic. Hg 7.1 this am. TED hose applied yesterday.  No events on tele. no signs infection. No metabolic derangements Reports "unsteady" with ambulation. Agreeable to snf -repeat orthostatic vital signs -see above -PT eval yields snf recommendation  3. Essential hypertension: Fair control. Home meds include lasix, isosorbide-hydralazine, metoprolol -lasix IV as noted above -continue home med -monitor closely  4. Coronary artery disease: Noted. Denies pain. Continue home medications.  5. Generalized weakness  with bilateral lower extremity weakness: likely deconditioning in above setting. TSH 7.1. PT recommends snf -snf when ready -check T4 -mobilize as able  6. 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 creatinine 2.6 which appears to be close to baseline.  -Continue home medication regimen -sliding scale insulin - avoid nephrotoxic  agents  7. Dyslipidemia: Continue home medication management.  8. History of stroke: Noted. -PT eval as noted above  9. Nonalcoholic steatohepatitis: Noted monitor liver function closely patient without any evidence of ascites or encephalopathy.  10. Pancytopenia: Likely related to NASH.appears stable.Monitor closely.  11. Dementia: very mild.  Noted continue to monitor.  12. History of paroxysmal atrial fibrillation. SR per ekg. No anticoag due to hx anemia  ##13.Mass of left lung: Concerning for frontal line malignancy or adenocarcinoma. Would discuss with patient and family prior to pursuing further work-up given his advanced age. Per Ct report "Persistent heterogeneous masslike opacity over the left lower lobe with areas of fat density measuring 4.2 x 5.9 cm. This masslike opacity persists despite resolution of the previously seen diffuse bilateral nodular airspace process. This raises possibility frontal lying malignancy/adenocarcinoma. No evidence of adenopathy. Palliative care consult yesterday. family and patient not likely to work up.   23. Son requesting palliative care eval for help at home. Spoke to patient regarding code status. He does not want compressions or intubation but does want "everything else".   15. Anemia. Hg 7.1 today. Down from 9.7 54months ago. -fobt -anemia panel   Code Status: limited Family Communication: Chad Mcmillan on phone Disposition Plan: likely snf when ready   Consultants:    Procedures:    Antibiotics:    HPI/Subjective: Admitted 04/25/19 with acute respiratory failure related to chf exacerbation in setting of ckd and anemia. Incidental finding of lung mass on CT. Getting lasix.   Objective: Vitals:   04/27/19 0630 04/27/19 0903  BP: (!) 149/53 (!) 140/53  Pulse: 61 (!) 58  Resp: 18 16  Temp: 97.7 F (36.5 C) 98.7 F (37.1 C)  SpO2: 100% 100%    Intake/Output Summary (Last 24  hours) at 04/27/2019 1038 Last data  filed at 04/27/2019 7591 Gross per 24 hour  Intake 582 ml  Output 1900 ml  Net -1318 ml   Filed Weights   04/25/19 1517 04/25/19 1608 04/26/19 0515  Weight: 66.8 kg 66.8 kg 66.8 kg    Exam:   General:  Awake, pale and frail appearing  Cardiovascular: rrr no mgr no LE edema  Respiratory: normal effort BS distant but clear  Abdomen: non-distended +BS no guarding or rebounding  Musculoskeletal: joints without swelling/erythema. Ted hose intact   Data Reviewed: Basic Metabolic Panel: Recent Labs  Lab 04/25/19 1019 04/26/19 0540 04/27/19 0357  NA 143 140 137  K 4.1 4.4 4.4  CL 104 102 103  CO2 29 28 26   GLUCOSE 165* 152* 115*  BUN 61* 63* 68*  CREATININE 2.50* 2.64* 2.72*  CALCIUM 8.6* 8.4* 8.0*   Liver Function Tests: Recent Labs  Lab 04/25/19 1019  AST 27  ALT 22  ALKPHOS 123  BILITOT 1.0  PROT 6.5  ALBUMIN 2.7*   No results for input(s): LIPASE, AMYLASE in the last 168 hours. No results for input(s): AMMONIA in the last 168 hours. CBC: Recent Labs  Lab 04/25/19 1019 04/27/19 0357  WBC 5.6 5.7  NEUTROABS 4.4  --   HGB 8.3* 7.1*  HCT 27.2* 22.4*  MCV 102.3* 99.6  PLT 106* 95*   Cardiac Enzymes: No results for input(s): CKTOTAL, CKMB, CKMBINDEX, TROPONINI in the last 168 hours. BNP (last 3 results) Recent Labs    12/19/18 1028 04/25/19 1019  BNP 1,887.1* 2,964.3*    ProBNP (last 3 results) Recent Labs    12/02/18 1416 12/14/18 1415 12/29/18 1356  PROBNP 11,875* 2,511.0* 1,328.0*    CBG: Recent Labs  Lab 04/26/19 0633 04/26/19 1120 04/26/19 1719 04/26/19 2146 04/27/19 0630  GLUCAP 132* 266* 143* 87 100*    Recent Results (from the past 240 hour(s))  Urine culture     Status: Abnormal   Collection Time: 04/25/19  9:51 AM  Result Value Ref Range Status   Specimen Description URINE, RANDOM  Final   Special Requests NONE  Final   Culture (A)  Final    <10,000 COLONIES/mL INSIGNIFICANT GROWTH Performed at [facility], [address] [place], [place] [address]    Report Status 04/26/2019 FINAL  Final  SARS Coronavirus 2 (CEPHEID- Performed in [place] hospital lab), Hosp Order     Status: None   Collection Time: 04/25/19 12:57 PM  Result Value Ref Range Status   SARS Coronavirus 2 NEGATIVE NEGATIVE Final    Comment: (NOTE) If result is NEGATIVE SARS-CoV-2 target nucleic acids are NOT DETECTED. The SARS-CoV-2 RNA is generally detectable in upper and lower  respiratory specimens during the acute phase of infection. The lowest  concentration of SARS-CoV-2 viral copies this assay can detect is 250  copies / mL. A negative result does not preclude SARS-CoV-2 infection  and should not be used as the sole basis for treatment or other  patient management decisions.  A negative result may occur with  improper specimen collection / handling, submission of specimen other  than nasopharyngeal swab, presence of viral mutation(s) within the  areas targeted by this assay, and inadequate number of viral copies  (<250 copies / mL). A negative result must be combined with clinical  observations, patient history, and epidemiological information. If result is POSITIVE SARS-CoV-2 target nucleic acids are DETECTED. The SARS-CoV-2 RNA is generally detectable in upper and lower  respiratory specimens dur ing the acute phase of infection.  Positive  results are indicative of active infection with SARS-CoV-2.  Clinical  correlation with patient history and other diagnostic information is  necessary to determine patient infection status.  Positive results do  not rule out bacterial infection or co-infection with other viruses. If result is PRESUMPTIVE POSTIVE SARS-CoV-2 nucleic acids MAY BE PRESENT.   A presumptive positive result was obtained on the submitted specimen  and confirmed on repeat testing.  While 2019 novel coronavirus  (SARS-CoV-2) nucleic acids may be present in the submitted sample  additional  confirmatory testing may be necessary for epidemiological  and / or clinical management purposes  to differentiate between  SARS-CoV-2 and other Sarbecovirus currently known to infect humans.  If clinically indicated additional testing with an alternate test  methodology [phone]) is advised. The SARS-CoV-2 RNA is generally  detectable in upper and lower respiratory sp ecimens during the acute  phase of infection. The expected result is Negative. Fact Sheet for Patients:  StrictlyIdeas.no Fact Sheet for Healthcare Providers: BankingDealers.co.za This test is not yet approved or cleared by the [place] FDA and has been authorized for detection and/or diagnosis of SARS-CoV-2 by FDA under an Emergency Use Authorization (EUA).  This EUA will remain in effect (meaning this test can be used) for the duration of the COVID-19 declaration under Section 564(b)(1) of the Act, 21 U.S.C. section 360bbb-3(b)(1), unless the authorization is terminated or revoked sooner. Performed at [facility], [place] [address] [place], [place] [address]   Blood Culture (routine x 2)     Status: None (Preliminary result)   Collection Time: 04/25/19 12:57 PM  Result Value Ref Range Status   Specimen Description BLOOD RIGHT ARM  Final   Special Requests   Final    BOTTLES DRAWN AEROBIC AND ANAEROBIC Blood Culture adequate volume   Culture   Final    NO GROWTH < 24 HOURS Performed at [facility], [place] [address], [place], [place] [address]    Report Status PENDING  Incomplete  Blood Culture (routine x 2)     Status: None (Preliminary result)   Collection Time: 04/25/19  1:07 PM  Result Value Ref Range Status   Specimen Description BLOOD RIGHT ANTECUBITAL  Final   Special Requests   Final    BOTTLES DRAWN AEROBIC AND ANAEROBIC Blood Culture adequate volume   Culture   Final    NO GROWTH < 24 HOURS Performed at [facility], [place] [address] [place], [place] [address]    Report Status PENDING  Incomplete  MRSA PCR Screening     Status: Abnormal   Collection Time: 04/26/19  8:25 AM  Result Value Ref Range Status   MRSA by PCR POSITIVE (A) NEGATIVE Final    Comment:        The GeneXpert MRSA Assay (FDA approved for NASAL specimens only), is one component of a comprehensive MRSA colonization surveillance program. It is not intended to diagnose MRSA infection nor to guide or monitor treatment for MRSA infections. RESULT CALLED TO, READ BACK BY AND VERIFIED WITH: A. Eglinger RN 10:30 04/26/19 ([email or web address]) Performed at [facility], [place] [address] [place], [place] [address]      Studies: Ct Head Wo Contrast  Result Date: 04/25/2019 CLINICAL DATA:  Syncopal episode.  Multiple falls. EXAM: CT HEAD WITHOUT CONTRAST CT CERVICAL SPINE WITHOUT CONTRAST TECHNIQUE: Multidetector CT imaging of the head and cervical spine was  performed following the standard protocol without intravenous contrast. Multiplanar CT image reconstructions of the cervical spine were also generated. COMPARISON:  Brain CT 07/17/2018. FINDINGS: CT HEAD FINDINGS Brain: Ventricles and sulci are prominent compatible with atrophy. Periventricular and subcortical white matter hypodensity compatible with chronic microvascular ischemic changes. No evidence for acute cortically based infarct, intracranial hemorrhage, mass lesion mass-effect. Vascular: Unremarkable Skull: Intact. Sinuses/Orbits: Paranasal sinuses are well aerated. Focal soft tissue and mucosal thickening within the right maxillary sinus (image 6; series 4). Orbits are unremarkable. Other: None. CT CERVICAL SPINE FINDINGS Alignment: Normal anatomic alignment. Skull base and vertebrae: No acute fracture. No primary bone lesion or focal pathologic process. Soft tissues and spinal canal: No prevertebral fluid or swelling. No visible canal hematoma. Disc levels: Degenerative disc disease most pronounced C3-4 and  C6-7. No evidence for acute fracture. Upper chest: Unremarkable. Other: Bilateral carotid arterial vascular calcifications. IMPRESSION: No acute intracranial process. Atrophy and chronic microvascular ischemic changes. No acute cervical spine fracture.  Degenerative disc disease. Soft tissue density material within the right maxillary sinus is nonspecific however may represent sequelae of chronic sinusitis. Recommend clinical correlation. Electronically Signed   By: Lovey Newcomer M.D.   On: 04/25/2019 11:34   Ct Chest Wo Contrast  Result Date: 04/25/2019 CLINICAL DATA:  Hypoxia plan standing.  Increased weakness. EXAM: CT CHEST WITHOUT CONTRAST TECHNIQUE: Multidetector CT imaging of the chest was performed following the standard protocol without IV contrast. COMPARISON:  02/27/2018 and chest x-ray 04/25/2019 FINDINGS: Cardiovascular: Mild cardiomegaly. Calcified plaque over the left main and 3 vessel coronary arteries. Calcified plaque over the thoracic aorta. Remaining vascular structures are unremarkable. Mediastinum/Nodes: No significant mediastinal or hilar adenopathy. No axillary adenopathy. Remaining mediastinal structures are unremarkable. Lungs/Pleura: Lungs are adequately inflated as there has been interval resolution of the previously seen bilateral nodular airspace process, although there is persistence of masslike heterogeneous density over the left lower lobe measuring 4.2 x 5.9 cm. The medial half of this masslike density has Hounsfield unit measurements of fat density as the lateral aspect may be due in part to compressive atelectasis. Small to moderate bilateral pleural effusions left greater than right. Stable nodular scarring over the right middle lobe. Aspirate material over the dependent portion of the upper trachea. Upper Abdomen: No acute findings. Calcified plaque over the abdominal aorta. Musculoskeletal: Degenerative change of the spine. IMPRESSION: Mild to moderate size bilateral pleural  effusions left greater than right. Persistent heterogeneous masslike opacity over the left lower lobe with areas of fat density measuring 4.2 x 5.9 cm. This masslike opacity persists despite resolution of the previously seen diffuse bilateral nodular airspace process. This raises possibility frontal lying malignancy/adenocarcinoma. No evidence of adenopathy. Consider PET-CT and pulmonary/thoracic surgical consultation for further evaluation. Stable nodular scarring over the right middle lobe. Aspirate material over the dependent portion of the upper trachea. Mild cardiomegaly. Left main and 3 vessel atherosclerotic coronary artery disease. Aortic Atherosclerosis (ICD10-I70.0). Electronically Signed   By: Marin Olp M.D.   On: 04/25/2019 14:35   Ct Cervical Spine Wo Contrast  Result Date: 04/25/2019 CLINICAL DATA:  Syncopal episode.  Multiple falls. EXAM: CT HEAD WITHOUT CONTRAST CT CERVICAL SPINE WITHOUT CONTRAST TECHNIQUE: Multidetector CT imaging of the head and cervical spine was performed following the standard protocol without intravenous contrast. Multiplanar CT image reconstructions of the cervical spine were also generated. COMPARISON:  Brain CT 07/17/2018. FINDINGS: CT HEAD FINDINGS Brain: Ventricles and sulci are prominent compatible with atrophy. Periventricular and subcortical white matter hypodensity compatible  with chronic microvascular ischemic changes. No evidence for acute cortically based infarct, intracranial hemorrhage, mass lesion mass-effect. Vascular: Unremarkable Skull: Intact. Sinuses/Orbits: Paranasal sinuses are well aerated. Focal soft tissue and mucosal thickening within the right maxillary sinus (image 6; series 4). Orbits are unremarkable. Other: None. CT CERVICAL SPINE FINDINGS Alignment: Normal anatomic alignment. Skull base and vertebrae: No acute fracture. No primary bone lesion or focal pathologic process. Soft tissues and spinal canal: No prevertebral fluid or swelling. No  visible canal hematoma. Disc levels: Degenerative disc disease most pronounced C3-4 and C6-7. No evidence for acute fracture. Upper chest: Unremarkable. Other: Bilateral carotid arterial vascular calcifications. IMPRESSION: No acute intracranial process. Atrophy and chronic microvascular ischemic changes. No acute cervical spine fracture.  Degenerative disc disease. Soft tissue density material within the right maxillary sinus is nonspecific however may represent sequelae of chronic sinusitis. Recommend clinical correlation. Electronically Signed   By: Lovey Newcomer M.D.   On: 04/25/2019 11:34    Scheduled Meds: . aspirin EC  81 mg Oral Daily  . atorvastatin  80 mg Oral q1800  . Chlorhexidine Gluconate Cloth  6 each Topical Q0600  . donepezil  5 mg Oral QHS  . ferrous gluconate  324 mg Oral Daily  . furosemide  60 mg Intravenous BID  . heparin  5,000 Units Subcutaneous Q8H  . insulin aspart  0-15 Units Subcutaneous TID WC  . isosorbide-hydrALAZINE  1 tablet Oral TID  . metoprolol tartrate  25 mg Oral BID  . mupirocin ointment  1 application Nasal BID  . polyethylene glycol  17 g Oral Daily  . potassium chloride  20 mEq Oral Daily  . repaglinide  0.5 mg Oral BID AC  . sodium chloride flush  3 mL Intravenous Q12H  . cyanocobalamin  1,000 mcg Oral Daily   Continuous Infusions: . 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 and stage 4 chronic kidney disease (HCC)   Near syncope   Acute on chronic combined systolic (congestive) and diastolic (congestive) heart failure (HCC)   Dyslipidemia   CAD (coronary artery disease)   NASH (nonalcoholic steatohepatitis)   Paroxysmal atrial fibrillation with RVR (HCC)   Type 2 diabetes mellitus  with nephropathy (HCC)   Bilateral leg weakness   Diabetic retinopathy (HCC)   Mass of left lung   History of stroke   Dementia (HCC)   Goals of care, counseling/discussion   Palliative care by specialist   Acute on chronic combined systolic and diastolic CHF (congestive heart failure) ([place])   DNR (do not resuscitate) discussion    Time spent: 32 minutes    [facility] M NP  [facility]  If 7PM-7AM, please contact night-coverage at [email or web address], password [facility] 04/27/2019, 10:38 AM  LOS: 1 day

## 2019-04-27 NOTE — Consult Note (Addendum)
   [facility] CM Inpatient Consult   04/27/2019  Chad Mcmillan 1924/03/25 [id number]    Patient was assessed for extreme high risk score for unplanned readmission with 2 hospitalizations in the past 6 months, and to check for potential [facility] Care Management needs for services as a benefit from his Medicare/ Next[employer] plan. Patient was active with [facility] in the past.     History and physical dated 04/25/19 shows as:  "Chad Mcmillan is a 85 y.o. male with medical history significant of diabetes type 2, stroke, coronary artery disease, congestive heart failure with ejection fraction of 25 to 30% also with diastolic dysfunction, diabetes with nephropathy and retinopathy who presents to the emergency department complaining of increasing weakness and shortness of breath.  Symptoms may have been subacute in terms of his weakness over the past 6 months the patient states he is had difficulty walking.  He told me that when he gets up to walk his oxygen levels drop and he just feels so weak."    (Hypertensive heart and kidney disease with acute on chronic combined systolic and diastolic congestive heart failure and stage IV chronic kidney disease: likely having mild exacerbation, Mass of left lung: concerning for frontal line malignancy or adenocarcinoma, near syncope)   With his multiple medical issues, palliative care consulted and recommends to continue outpatient palliative services.  Per PT evaluation, patient was recommended for SNF (skilled nursing facility). MD note shows SNF for D/C when medically stable. Transition of care CM/ SW note review, states that patient is agreeable to SNF at discharge.  Primary care Provider is Dr. Billey Gosling with [facility], listed as providing transition of care follow-up.  Will follow for progress and disposition, if there are changes with disposition and needs, please place a referral to [facility] care management for follow-up as appropriate.  Of note, [facility]  Management services does not replace or interfere with any services that are arranged by inpatient transition of care CM or social work.  Will notify post acute RN coordinator if patient going to skilled nursing facility covered by [facility].   For additional questions or referrals please contact:  Edwena Felty A. Logan [place], BSN, RN-BC [facility] Liaison Cell: [phone]

## 2019-04-27 NOTE — Progress Notes (Addendum)
Palliative:   Mr. Kruse is sitting up in the [place] chair in his room.  He is sleeping comfortably.  He wakes easily when I touch his arm and call his name.  He tells me that he is not having any pain, no complaints.  We talked about going to rehab, and he tells me that he is agreeable.  I share that his sons Louie Casa and [name] are caring for his wife and he smiles and nods his head.  As I am leaving Mr. Deliah Boston tells me that he feels that the oxygen we have given him has helped him better than anything else that we have done.  He asks if he could have oxygen at SNF/home.  Request nursing staff evaluate for oxygen needs.  Conference with bedside nursing related to patient needs, condition, hemoglobin trend. Conference with social worker related to patient needs, CODE STATUS, disposition.  Call to son Louie Casa at [phone].  Left detailed voicemail message related to patient condition, request for oxygen, disposition discussion with Education officer, museum.  I encourage Louie Casa to continue to review MOST form.  HGB trend and blood transfusions.  Encouraged to call as needed.  Please continue outpatient palliative services, please have Outpatient palliative call to son Louie Casa.   We review and completed MOST form with sons Louie Casa and [name] via phone. Hard copy placed on chart.  DNR, Limited interventions, limited use of antibiotics and IV fluids, NO PEG tube.   Louie Casa and [name] share that they do not want to prolong their father suffering.  Appropriately, they do not discuss CODE STATUS or the possibility of lung cancer with him.  Louie Casa and [name] state that they will not seek cancer work-up, instead focusing on treating the treatable, balance and keeping Mr. Stavola comfortable.  We talked about future pneumonia related to postobstructive lung cancer.  Louie Casa and [name] thankfully see their father clearly and are focused on what is best for him.  65 minutes, extended time  Quinn Axe, NP Palliative Medicine Team Team Phone #  [phone] Greater than 50% of this time was spent counseling and coordinating care related to the above assessment and plan

## 2019-04-28 ENCOUNTER — Telehealth: Payer: Medicare Other | Admitting: Physician Assistant

## 2019-04-28 DIAGNOSIS — E1121 Type 2 diabetes mellitus with diabetic nephropathy: Secondary | ICD-10-CM

## 2019-04-28 DIAGNOSIS — F0390 Unspecified dementia without behavioral disturbance: Secondary | ICD-10-CM

## 2019-04-28 DIAGNOSIS — D631 Anemia in chronic kidney disease: Secondary | ICD-10-CM

## 2019-04-28 DIAGNOSIS — Z515 Encounter for palliative care: Secondary | ICD-10-CM

## 2019-04-28 DIAGNOSIS — K7581 Nonalcoholic steatohepatitis (NASH): Secondary | ICD-10-CM

## 2019-04-28 DIAGNOSIS — I480 Paroxysmal atrial fibrillation: Secondary | ICD-10-CM

## 2019-04-28 LAB — TYPE AND SCREEN
ABO/RH(D): A POS
Antibody Screen: NEGATIVE
Unit division: 0

## 2019-04-28 LAB — GLUCOSE, CAPILLARY
Glucose-Capillary: 84 mg/dL (ref 70–99)
Glucose-Capillary: 245 mg/dL — ABNORMAL HIGH (ref 70–99)
Glucose-Capillary: 91 mg/dL (ref 70–99)

## 2019-04-28 LAB — BASIC METABOLIC PANEL
Anion gap: 10 (ref 5–15)
BUN: 76 mg/dL — ABNORMAL HIGH (ref 8–23)
CO2: 29 mmol/L (ref 22–32)
Calcium: 8.3 mg/dL — ABNORMAL LOW (ref 8.9–10.3)
Chloride: 98 mmol/L (ref 98–111)
Creatinine, Ser: 2.85 mg/dL — ABNORMAL HIGH (ref 0.61–1.24)
GFR calc Af Amer: 21 mL/min — ABNORMAL LOW (ref >60)
GFR calc non Af Amer: 18 mL/min — ABNORMAL LOW (ref >60)
Glucose, Bld: 106 mg/dL — ABNORMAL HIGH (ref 70–99)
Potassium: 5.1 mmol/L (ref 3.5–5.1)
Sodium: 137 mmol/L (ref 135–145)

## 2019-04-28 LAB — CBC
HCT: 25.2 % — ABNORMAL LOW (ref 39.0–52.0)
Hemoglobin: 8.1 g/dL — ABNORMAL LOW (ref 13.0–17.0)
MCH: 31 pg (ref 26.0–34.0)
MCHC: 32.1 g/dL (ref 30.0–36.0)
MCV: 96.6 fL (ref 80.0–100.0)
Platelets: 98 10*3/uL — ABNORMAL LOW (ref 150–400)
RBC: 2.61 MIL/uL — ABNORMAL LOW (ref 4.22–5.81)
RDW: 15.3 % (ref 11.5–15.5)
WBC: 5.6 10*3/uL (ref 4.0–10.5)
nRBC: 0 % (ref 0.0–0.2)

## 2019-04-28 LAB — BPAM RBC
Blood Product Expiration Date: 202006242359
ISSUE DATE / TIME: 202006091624
Unit Type and Rh: 6200

## 2019-04-28 LAB — T4, FREE: Free T4: 1.01 ng/dL (ref 0.61–1.12)

## 2019-04-28 MED ORDER — ACETAMINOPHEN 325 MG PO TABS: 650.0000 mg | ORAL_TABLET | ORAL | Status: AC | PRN

## 2019-04-28 MED ORDER — LORAZEPAM 0.5 MG PO TABS: 0.5000 mg | ORAL_TABLET | Freq: Every evening | ORAL | 0 refills | Status: DC | PRN

## 2019-04-28 NOTE — Discharge Summary (Addendum)
Physician Discharge Summary  Chad Mcmillan JKD:[id number] DOB: October 23, 1924 DOA: 04/25/2019  PCP: Binnie Rail, MD  Admit date: 04/25/2019 Discharge date: 04/28/2019  Admitted From: home Disposition:  SNF   Recommendations for Outpatient Follow-up:  1. Follow daily weights and daily pulse ox- adjust dose of Lasix as needed 2. Orthostatic vitals QOD x 4 occurrences 3. TEDS- on in AM- off in PM please 4. Palliative care to follow at SNF please       Discharge Condition:  stable   CODE STATUS:  No CPR, no Intubation  Diet recommendation:  Heart healthy Consultations:  Palliative care    Discharge Diagnoses:  Principal Problem:   Acute respiratory failure with hypoxia - acute systolic CHF Active Problems:   Mass of left lung Orthostatic hypotension   Dyslipidemia   Essential hypertension   CAD (coronary artery disease)   History of stroke   NASH (nonalcoholic steatohepatitis)   Goals of care, counseling/discussion   Anemia   Generalized weakness   Dementia (HCC)   Palliative care by specialist   Paroxysmal atrial fibrillation with RVR ([place])   Type 2 diabetes mellitus with nephropathy (HCC)   Stage 4 chronic kidney disease (HCC)   Bilateral leg weakness   Diabetic retinopathy ([place])         Brief Summary: Chad Mcmillan is a 85 y.o. male with medical history significant of diabetes type 2, stroke, diffuse 3 vessel coronary artery disease, congestive heart failure with ejection fraction of 25 to 30% also with diastolic dysfunction, diabetes with nephropathy (CKD4) and retinopathy who presents to the emergency department complaining of increasing weakness and shortness of breath.  CT chest w/o contrast in ED> Mild to moderate size bilateral pleural effusions left greater than right. Persistent heterogeneous masslike opacity over the left lower lobe with areas of fat density measuring 4.2 x 5.9 cm. This masslike opacity persists despite resolution of the previously seen  diffuse bilateral nodular airspace process. This raises possibility frontal lying malignancy/adenocarcinoma.  Suspected to be having a CHF exacerbation and also noted to have orthostatic hypotension.  I am seeing him for the first time today. He will be going to SNF.   Hospital Course:  Acute resp failure with hypoxia, acute systolic CHF - see above CT Report - last ECHO was on 2/220 and therefore not repeated- showed EF 25-30% with global hypokinesis an indeterminate filling pressures - diuresed with IV Lasix, weaned off of O2 to room air - will place back on home dose of Lasix for now- he will be going to SNF where I will recommend daily weights and pulse ox be monitored and Lasix be adjusted as needed - Lopressor and Bidil being continued at prior doses  Orthostatic hypotension - interestingly, despite pleural effusions/ fluid overload, he was also orthostatic - TEDS placed- this AM his orthostatic vitals are negative- he was able to stand for 3 min w/o feeling light headed - please follow at SNF  Lung mass - see above CT report - discussed with patient and family- palliative care consult also requested for [place] - patient and family do not want further work up  PAF - on baby aspirin only for Anticoagulation - cont Metoprolol  H/o NASH - no signs of encephalopathy or ascites  - Pancytopenia - ? Due to cirrhosis  CKD 4 - Cr ranges ~ 2.5- 2.9 - stable  Anemia of chronic disease - anemia panel reviewed-  normal Folate, B12 and elevated ferretin with low IBC - 1  U PRBC given yesterday for Hb of 7.1 which has improved to 8.1  DM2- controlled - A1c 6.7 - cont Repaglinide  Mild dementia - cont Aricept   Discharge Exam: Vitals:   04/28/19 0602 04/28/19 0818  BP: (!) 141/50 (!) 160/54  Pulse: (!) 58 (!) 59  Resp: 18   Temp: 98.1 F (36.7 C)   SpO2: 97% 99%   Vitals:   04/27/19 1933 04/27/19 1953 04/28/19 0602 04/28/19 0818  BP: (!) 122/59 (!) 141/58 (!) 141/50  (!) 160/54  Pulse: (!) 57 79 (!) 58 (!) 59  Resp:  18 18   Temp: 97.9 F (36.6 C) 98.3 F (36.8 C) 98.1 F (36.7 C)   TempSrc: Oral Oral Oral   SpO2: 100% 100% 97% 99%  Weight:    67.7 kg  Height:        General: Pt is alert, awake, not in acute distress Cardiovascular: RRR, S1/S2 +, no rubs, no gallops Respiratory: CTA bilaterally, no wheezing, no rhonchi Abdominal: Soft, NT, ND, bowel sounds + Extremities: no edema, no cyanosis   Discharge Instructions  Discharge Instructions    Diet - low sodium heart healthy   Complete by:  As directed    Increase activity slowly   Complete by:  As directed      Allergies as of 04/28/2019   No Known Allergies     Medication List    STOP taking these medications   meclizine 12.5 MG tablet Commonly known as:  ANTIVERT   ONE TOUCH ULTRA 2 w/Device Kit   OneTouch Delica Lancets 44R Misc     TAKE these medications   acetaminophen 325 MG tablet Commonly known as:  TYLENOL Take 2 tablets (650 mg total) by mouth every 4 (four) hours as needed for headache or mild pain.   aspirin EC 81 MG tablet Take 1 tablet (81 mg total) by mouth daily.   atorvastatin 40 MG tablet Commonly known as:  LIPITOR TAKE 2 TABLETS BY MOUTH ONCE DAILY AT 6 IN THE EVENING What changed:  See the new instructions.   cyanocobalamin 1000 MCG tablet Take 1,000 mcg by mouth daily.   donepezil 5 MG tablet Commonly known as:  ARICEPT TAKE 1 TABLET BY MOUTH AT BEDTIME   Ferrous Gluconate 324 (37.5 Fe) MG Tabs Take 324 mg by mouth daily.   furosemide 40 MG tablet Commonly known as:  LASIX Take 40 mg by mouth every other day.   glucose blood test strip Commonly known as:  ONE TOUCH ULTRA TEST Use 1 test strip to monitor glucose levels BID; E11.9; NPI [id number]   isosorbide-hydrALAZINE 20-37.5 MG tablet Commonly known as:  BIDIL Take 1 tablet by mouth 3 (three) times daily.   LORazepam 0.5 MG tablet Commonly known as:  ATIVAN Take 1 tablet (0.5  mg total) by mouth at bedtime as needed for anxiety.   metoprolol tartrate 25 MG tablet Commonly known as:  LOPRESSOR Take 1 tablet (25 mg total) by mouth 2 (two) times daily.   nitroGLYCERIN 0.4 MG SL tablet Commonly known as:  NITROSTAT Place 1 tablet (0.4 mg total) under the tongue every 5 (five) minutes as needed. For chest pain   polyethylene glycol 17 g packet Commonly known as:  MIRALAX / GLYCOLAX Take 17 g by mouth daily.   repaglinide 0.5 MG tablet Commonly known as:  PRANDIN Take 1 tablet (0.5 mg total) by mouth 2 (two) times daily before a meal.   senna-docusate 8.6-50 MG tablet Commonly known  as:  Senokot-S Take 1 tablet by mouth at bedtime as needed for mild constipation or moderate constipation.      Contact information for after-discharge care    Destination    [facility] .   Service:  Skilled Nursing Contact information: [place] [phone]             No Known Allergies   Procedures/Studies:    Ct Head Wo Contrast  Result Date: 04/25/2019 CLINICAL DATA:  Syncopal episode.  Multiple falls. EXAM: CT HEAD WITHOUT CONTRAST CT CERVICAL SPINE WITHOUT CONTRAST TECHNIQUE: Multidetector CT imaging of the head and cervical spine was performed following the standard protocol without intravenous contrast. Multiplanar CT image reconstructions of the cervical spine were also generated. COMPARISON:  Brain CT 07/17/2018. FINDINGS: CT HEAD FINDINGS Brain: Ventricles and sulci are prominent compatible with atrophy. Periventricular and subcortical white matter hypodensity compatible with chronic microvascular ischemic changes. No evidence for acute cortically based infarct, intracranial hemorrhage, mass lesion mass-effect. Vascular: Unremarkable Skull: Intact. Sinuses/Orbits: Paranasal sinuses are well aerated. Focal soft tissue and mucosal thickening within the right maxillary sinus (image 6;  series 4). Orbits are unremarkable. Other: None. CT CERVICAL SPINE FINDINGS Alignment: Normal anatomic alignment. Skull base and vertebrae: No acute fracture. No primary bone lesion or focal pathologic process. Soft tissues and spinal canal: No prevertebral fluid or swelling. No visible canal hematoma. Disc levels: Degenerative disc disease most pronounced C3-4 and C6-7. No evidence for acute fracture. Upper chest: Unremarkable. Other: Bilateral carotid arterial vascular calcifications. IMPRESSION: No acute intracranial process. Atrophy and chronic microvascular ischemic changes. No acute cervical spine fracture.  Degenerative disc disease. Soft tissue density material within the right maxillary sinus is nonspecific however may represent sequelae of chronic sinusitis. Recommend clinical correlation. Electronically Signed   By: Lovey Newcomer M.D.   On: 04/25/2019 11:34   Ct Chest Wo Contrast  Result Date: 04/25/2019 CLINICAL DATA:  Hypoxia plan standing.  Increased weakness. EXAM: CT CHEST WITHOUT CONTRAST TECHNIQUE: Multidetector CT imaging of the chest was performed following the standard protocol without IV contrast. COMPARISON:  02/27/2018 and chest x-ray 04/25/2019 FINDINGS: Cardiovascular: Mild cardiomegaly. Calcified plaque over the left main and 3 vessel coronary arteries. Calcified plaque over the thoracic aorta. Remaining vascular structures are unremarkable. Mediastinum/Nodes: No significant mediastinal or hilar adenopathy. No axillary adenopathy. Remaining mediastinal structures are unremarkable. Lungs/Pleura: Lungs are adequately inflated as there has been interval resolution of the previously seen bilateral nodular airspace process, although there is persistence of masslike heterogeneous density over the left lower lobe measuring 4.2 x 5.9 cm. The medial half of this masslike density has Hounsfield unit measurements of fat density as the lateral aspect may be due in part to compressive atelectasis.  Small to moderate bilateral pleural effusions left greater than right. Stable nodular scarring over the right middle lobe. Aspirate material over the dependent portion of the upper trachea. Upper Abdomen: No acute findings. Calcified plaque over the abdominal aorta. Musculoskeletal: Degenerative change of the spine. IMPRESSION: Mild to moderate size bilateral pleural effusions left greater than right. Persistent heterogeneous masslike opacity over the left lower lobe with areas of fat density measuring 4.2 x 5.9 cm. This masslike opacity persists despite resolution of the previously seen diffuse bilateral nodular airspace process. This raises possibility frontal lying malignancy/adenocarcinoma. No evidence of adenopathy. Consider PET-CT and pulmonary/thoracic surgical consultation for further evaluation. Stable nodular scarring over the right middle lobe. Aspirate material over the dependent portion of  the upper trachea. Mild cardiomegaly. Left main and 3 vessel atherosclerotic coronary artery disease. Aortic Atherosclerosis (ICD10-I70.0). Electronically Signed   By: Marin Olp M.D.   On: 04/25/2019 14:35   Ct Cervical Spine Wo Contrast  Result Date: 04/25/2019 CLINICAL DATA:  Syncopal episode.  Multiple falls. EXAM: CT HEAD WITHOUT CONTRAST CT CERVICAL SPINE WITHOUT CONTRAST TECHNIQUE: Multidetector CT imaging of the head and cervical spine was performed following the standard protocol without intravenous contrast. Multiplanar CT image reconstructions of the cervical spine were also generated. COMPARISON:  Brain CT 07/17/2018. FINDINGS: CT HEAD FINDINGS Brain: Ventricles and sulci are prominent compatible with atrophy. Periventricular and subcortical white matter hypodensity compatible with chronic microvascular ischemic changes. No evidence for acute cortically based infarct, intracranial hemorrhage, mass lesion mass-effect. Vascular: Unremarkable Skull: Intact. Sinuses/Orbits: Paranasal sinuses are well  aerated. Focal soft tissue and mucosal thickening within the right maxillary sinus (image 6; series 4). Orbits are unremarkable. Other: None. CT CERVICAL SPINE FINDINGS Alignment: Normal anatomic alignment. Skull base and vertebrae: No acute fracture. No primary bone lesion or focal pathologic process. Soft tissues and spinal canal: No prevertebral fluid or swelling. No visible canal hematoma. Disc levels: Degenerative disc disease most pronounced C3-4 and C6-7. No evidence for acute fracture. Upper chest: Unremarkable. Other: Bilateral carotid arterial vascular calcifications. IMPRESSION: No acute intracranial process. Atrophy and chronic microvascular ischemic changes. No acute cervical spine fracture.  Degenerative disc disease. Soft tissue density material within the right maxillary sinus is nonspecific however may represent sequelae of chronic sinusitis. Recommend clinical correlation. Electronically Signed   By: Lovey Newcomer M.D.   On: 04/25/2019 11:34   Dg Chest Portable 1 View  Result Date: 04/25/2019 CLINICAL DATA:  Syncopal episode. EXAM: PORTABLE CHEST 1 VIEW COMPARISON:  Chest radiograph 12/19/2018. FINDINGS: Monitoring leads overlie the patient. Stable cardiomegaly. Aortic atherosclerosis. Moderate bilateral pleural effusions with underlying bibasilar consolidation. No pneumothorax. IMPRESSION: Cardiomegaly, bilateral pleural effusions and underlying opacities which may represent atelectasis, edema and/or infection. Electronically Signed   By: Lovey Newcomer M.D.   On: 04/25/2019 10:37     The results of significant diagnostics from this hospitalization (including imaging, microbiology, ancillary and laboratory) are listed below for reference.     Microbiology: Recent Results (from the past 240 hour(s))  Urine culture     Status: Abnormal   Collection Time: 04/25/19  9:51 AM  Result Value Ref Range Status   Specimen Description URINE, RANDOM  Final   Special Requests NONE  Final   Culture  (A)  Final    <10,000 COLONIES/mL INSIGNIFICANT GROWTH Performed at [facility], [place] [address], [place], [place] [address]    Report Status 04/26/2019 FINAL  Final  SARS Coronavirus 2 (CEPHEID- Performed in [place] hospital lab), Hosp Order     Status: None   Collection Time: 04/25/19 12:57 PM  Result Value Ref Range Status   SARS Coronavirus 2 NEGATIVE NEGATIVE Final    Comment: (NOTE) If result is NEGATIVE SARS-CoV-2 target nucleic acids are NOT DETECTED. The SARS-CoV-2 RNA is generally detectable in upper and lower  respiratory specimens during the acute phase of infection. The lowest  concentration of SARS-CoV-2 viral copies this assay can detect is 250  copies / mL. A negative result does not preclude SARS-CoV-2 infection  and should not be used as the sole basis for treatment or other  patient management decisions.  A negative result may occur with  improper specimen collection / handling, submission of specimen other  than nasopharyngeal swab, presence of viral mutation(s) within the  areas targeted by this assay, and inadequate number of viral copies  (<250 copies / mL). A negative result must be combined with clinical  observations, patient history, and epidemiological information. If result is POSITIVE SARS-CoV-2 target nucleic acids are DETECTED. The SARS-CoV-2 RNA is generally detectable in upper and lower  respiratory specimens dur ing the acute phase of infection.  Positive  results are indicative of active infection with SARS-CoV-2.  Clinical  correlation with patient history and other diagnostic information is  necessary to determine patient infection status.  Positive results do  not rule out bacterial infection or co-infection with other viruses. If result is PRESUMPTIVE POSTIVE SARS-CoV-2 nucleic acids MAY BE PRESENT.   A presumptive positive result was obtained on the submitted specimen  and confirmed on repeat testing.  While 2019 novel coronavirus   (SARS-CoV-2) nucleic acids may be present in the submitted sample  additional confirmatory testing may be necessary for epidemiological  and / or clinical management purposes  to differentiate between  SARS-CoV-2 and other Sarbecovirus currently known to infect humans.  If clinically indicated additional testing with an alternate test  methodology [phone]) is advised. The SARS-CoV-2 RNA is generally  detectable in upper and lower respiratory sp ecimens during the acute  phase of infection. The expected result is Negative. Fact Sheet for Patients:  StrictlyIdeas.no Fact Sheet for Healthcare Providers: BankingDealers.co.za This test is not yet approved or cleared by the [place] FDA and has been authorized for detection and/or diagnosis of SARS-CoV-2 by FDA under an Emergency Use Authorization (EUA).  This EUA will remain in effect (meaning this test can be used) for the duration of the COVID-19 declaration under Section 564(b)(1) of the Act, 21 U.S.C. section 360bbb-3(b)(1), unless the authorization is terminated or revoked sooner. Performed at [facility], [place] [address], [place], [place] [address]   Blood Culture (routine x 2)     Status: None (Preliminary result)   Collection Time: 04/25/19 12:57 PM  Result Value Ref Range Status   Specimen Description BLOOD RIGHT ARM  Final   Special Requests   Final    BOTTLES DRAWN AEROBIC AND ANAEROBIC Blood Culture adequate volume   Culture   Final    NO GROWTH 2 DAYS Performed at [facility], [address] [place], [place] [address]    Report Status PENDING  Incomplete  Blood Culture (routine x 2)     Status: None (Preliminary result)   Collection Time: 04/25/19  1:07 PM  Result Value Ref Range Status   Specimen Description BLOOD RIGHT ANTECUBITAL  Final   Special Requests   Final    BOTTLES DRAWN AEROBIC AND ANAEROBIC Blood Culture adequate volume   Culture   Final     NO GROWTH 2 DAYS Performed at [facility], [place] [address] [place], [place] [address]    Report Status PENDING  Incomplete  MRSA PCR Screening     Status: Abnormal   Collection Time: 04/26/19  8:25 AM  Result Value Ref Range Status   MRSA by PCR POSITIVE (A) NEGATIVE Final    Comment:        The GeneXpert MRSA Assay (FDA approved for NASAL specimens only), is one component of a comprehensive MRSA colonization surveillance program. It is not intended to diagnose MRSA infection nor to guide or monitor treatment for MRSA infections. RESULT CALLED TO, READ BACK BY AND VERIFIED WITH: A. Eglinger  RN 10:30 04/26/19 ([email or web address]) Performed at [facility], [place] [address], [place], [place] [address]      Labs: BNP (last 3 results) Recent Labs    12/19/18 1028 04/25/19 1019  BNP 1,887.1* 1,740.8*   Basic Metabolic Panel: Recent Labs  Lab 04/25/19 1019 04/26/19 0540 04/27/19 0357 04/28/19 0338  NA 143 140 137 137  K 4.1 4.4 4.4 5.1  CL 104 102 103 98  CO2 29 28 26 29   GLUCOSE 165* 152* 115* 106*  BUN 61* 63* 68* 76*  CREATININE 2.50* 2.64* 2.72* 2.85*  CALCIUM 8.6* 8.4* 8.0* 8.3*   Liver Function Tests: Recent Labs  Lab 04/25/19 1019  AST 27  ALT 22  ALKPHOS 123  BILITOT 1.0  PROT 6.5  ALBUMIN 2.7*   No results for input(s): LIPASE, AMYLASE in the last 168 hours. No results for input(s): AMMONIA in the last 168 hours. CBC: Recent Labs  Lab 04/25/19 1019 04/27/19 0357 04/28/19 0338  WBC 5.6 5.7 5.6  NEUTROABS 4.4  --   --   HGB 8.3* 7.1* 8.1*  HCT 27.2* 22.4* 25.2*  MCV 102.3* 99.6 96.6  PLT 106* 95* 98*   Cardiac Enzymes: No results for input(s): CKTOTAL, CKMB, CKMBINDEX, TROPONINI in the last 168 hours. BNP: Invalid input(s): POCBNP CBG: Recent Labs  Lab 04/27/19 0630 04/27/19 1112 04/27/19 1625 04/27/19 2119 04/28/19 0604  GLUCAP 100* 207* 99 187* 84   D-Dimer Recent Labs    04/25/19 1218  DDIMER 4.77*   Hgb A1c Recent  Labs    04/25/19 1645  HGBA1C 6.7*   Lipid Profile Recent Labs    04/25/19 1239  TRIG 86   Thyroid function studies Recent Labs    04/27/19 0400  TSH 7.135*   Anemia work up Recent Labs    04/25/19 1218 04/27/19 1001 04/27/19 1030  VITAMINB12  --  750  --   FOLATE  --   --  7.2  FERRITIN 464* 348*  --   TIBC  --  175*  --   IRON  --  42*  --   RETICCTPCT  --  1.8  --    Urinalysis    Component Value Date/Time   COLORURINE YELLOW 04/25/2019 [place] 04/25/2019 0951   LABSPEC 1.013 04/25/2019 0951   PHURINE 5.0 04/25/2019 0951   GLUCOSEU 50 (A) 04/25/2019 0951   GLUCOSEU NEGATIVE 09/07/2014 1334   [place] NEGATIVE 04/25/2019 0951   BILIRUBINUR NEGATIVE 04/25/2019 0951   KETONESUR NEGATIVE 04/25/2019 0951   PROTEINUR >=300 (A) 04/25/2019 0951   UROBILINOGEN 0.2 09/07/2014 1334   NITRITE NEGATIVE 04/25/2019 0951   LEUKOCYTESUR NEGATIVE 04/25/2019 0951   Sepsis Labs Invalid input(s): PROCALCITONIN,  WBC,  LACTICIDVEN Microbiology Recent Results (from the past 240 hour(s))  Urine culture     Status: Abnormal   Collection Time: 04/25/19  9:51 AM  Result Value Ref Range Status   Specimen Description URINE, RANDOM  Final   Special Requests NONE  Final   Culture (A)  Final    <10,000 COLONIES/mL INSIGNIFICANT GROWTH Performed at [facility], [address] [place], [place] [address]    Report Status 04/26/2019 FINAL  Final  SARS Coronavirus 2 (CEPHEID- Performed in [place] hospital lab), Hosp Order     Status: None   Collection Time: 04/25/19 12:57 PM  Result Value Ref Range Status   SARS Coronavirus 2 NEGATIVE NEGATIVE Final    Comment: (NOTE) If result is NEGATIVE  SARS-CoV-2 target nucleic acids are NOT DETECTED. The SARS-CoV-2 RNA is generally detectable in upper and lower  respiratory specimens during the acute phase of infection. The lowest  concentration of SARS-CoV-2 viral copies this assay can detect is 250  copies / mL.  A negative result does not preclude SARS-CoV-2 infection  and should not be used as the sole basis for treatment or other  patient management decisions.  A negative result may occur with  improper specimen collection / handling, submission of specimen other  than nasopharyngeal swab, presence of viral mutation(s) within the  areas targeted by this assay, and inadequate number of viral copies  (<250 copies / mL). A negative result must be combined with clinical  observations, patient history, and epidemiological information. If result is POSITIVE SARS-CoV-2 target nucleic acids are DETECTED. The SARS-CoV-2 RNA is generally detectable in upper and lower  respiratory specimens dur ing the acute phase of infection.  Positive  results are indicative of active infection with SARS-CoV-2.  Clinical  correlation with patient history and other diagnostic information is  necessary to determine patient infection status.  Positive results do  not rule out bacterial infection or co-infection with other viruses. If result is PRESUMPTIVE POSTIVE SARS-CoV-2 nucleic acids MAY BE PRESENT.   A presumptive positive result was obtained on the submitted specimen  and confirmed on repeat testing.  While 2019 novel coronavirus  (SARS-CoV-2) nucleic acids may be present in the submitted sample  additional confirmatory testing may be necessary for epidemiological  and / or clinical management purposes  to differentiate between  SARS-CoV-2 and other Sarbecovirus currently known to infect humans.  If clinically indicated additional testing with an alternate test  methodology [phone]) is advised. The SARS-CoV-2 RNA is generally  detectable in upper and lower respiratory sp ecimens during the acute  phase of infection. The expected result is Negative. Fact Sheet for Patients:  StrictlyIdeas.no Fact Sheet for Healthcare Providers: BankingDealers.co.za This test is not  yet approved or cleared by the [place] FDA and has been authorized for detection and/or diagnosis of SARS-CoV-2 by FDA under an Emergency Use Authorization (EUA).  This EUA will remain in effect (meaning this test can be used) for the duration of the COVID-19 declaration under Section 564(b)(1) of the Act, 21 U.S.C. section 360bbb-3(b)(1), unless the authorization is terminated or revoked sooner. Performed at [facility], [place] [address] [place], [place] [address]   Blood Culture (routine x 2)     Status: None (Preliminary result)   Collection Time: 04/25/19 12:57 PM  Result Value Ref Range Status   Specimen Description BLOOD RIGHT ARM  Final   Special Requests   Final    BOTTLES DRAWN AEROBIC AND ANAEROBIC Blood Culture adequate volume   Culture   Final    NO GROWTH 2 DAYS Performed at [facility], [address] [place], [place] [address]    Report Status PENDING  Incomplete  Blood Culture (routine x 2)     Status: None (Preliminary result)   Collection Time: 04/25/19  1:07 PM  Result Value Ref Range Status   Specimen Description BLOOD RIGHT ANTECUBITAL  Final   Special Requests   Final    BOTTLES DRAWN AEROBIC AND ANAEROBIC Blood Culture adequate volume   Culture   Final    NO GROWTH 2 DAYS Performed at [facility], [place] [address], [place], [place] [address]    Report Status PENDING  Incomplete  MRSA PCR Screening  Status: Abnormal   Collection Time: 04/26/19  8:25 AM  Result Value Ref Range Status   MRSA by PCR POSITIVE (A) NEGATIVE Final    Comment:        The GeneXpert MRSA Assay (FDA approved for NASAL specimens only), is one component of a comprehensive MRSA colonization surveillance program. It is not intended to diagnose MRSA infection nor to guide or monitor treatment for MRSA infections. RESULT CALLED TO, READ BACK BY AND VERIFIED WITH: A. Eglinger RN 10:30 04/26/19 ([email or web address]) Performed at [facility], [place] [address], [place], [place] [address]      Time coordinating discharge in minutes: 48  SIGNED:   Debbe Odea, MD  [facility] 04/28/2019, 9:34 AM Pager   If 7PM-7AM, please contact night-coverage [email or web address] Password [email or web address]

## 2019-04-28 NOTE — TOC Transition Note (Signed)
Transition of Care [facility]) - CM/SW Discharge Note   Patient Details  Name: Chad Mcmillan MRN: [id number] Date of Birth: [birth year]  [facility]) CM/SW Contact:  Candie Chroman, LCSW Phone Number: 04/28/2019, 10:35 AM   Clinical Narrative: CSW facilitated patient discharge including contacting patient family and facility to confirm patient discharge plans. Clinical information faxed to facility and family agreeable with plan. CSW arranged ambulance transport via [facility] to [facility] at 3:00. RN to call report prior to discharge [phone] Room 3247).  CSW will sign off for now as social work intervention is no longer needed. Please consult [place] again if new needs arise.  Final next level of care: Skilled Nursing Facility Barriers to Discharge: Barriers Resolved   Patient Goals and CMS Choice Patient states their goals for this hospitalization and ongoing recovery are:: "I hope I can go home on oxygen. This is the best thing that's happened to me in 6 months." CMS Medicare.gov Compare Post Acute Care list provided to:: Patient(Gave son scores for [facility] over the phone.) Choice offered to / list presented to : Patient, Adult Children  Discharge Placement   Existing PASRR number confirmed : 04/27/19          Patient chooses bed at: [facility] Patient to be transferred to facility by: [place] Name of family member notified: Pete Pelt Patient and family notified of of transfer: 04/28/19  Discharge Plan and Services     Post Acute Care Choice: [place]                               Social Determinants of Health (SDOH) Interventions     Readmission Risk Interventions Readmission Risk Prevention Plan 04/27/2019  Transportation Screening Complete  PCP or Specialist appointment within 3-5 days of discharge Complete  SW Recovery Care/Counseling Consult Complete  Palliative Care Screening Complete  Skilled Nursing Facility  Complete  Some recent data might be hidden

## 2019-04-28 NOTE — Progress Notes (Signed)
Called and spoke to RN at [facility] report- let them know about Pressure wound on butt and diabetic ulcer that has calloused over on right heel.

## 2019-04-28 NOTE — TOC Progression Note (Signed)
Transition of Care [facility]) - Progression Note    Patient Details  Name: ASHLEY MONTMINY MRN: [id number] Date of Birth: 11-09-1924  Transition of Care [facility]) CM/SW [place], LCSW Phone Number: 04/28/2019, 9:12 AM  Clinical Narrative:  SNF unsure if they will have a bed tomorrow so they are requesting family come to the facility to complete the admissions paperwork at 2:30 today to secure bed just in case. Discussed with son Louie Casa and either him or his brother Chriss Czar will be there.  Expected Discharge Plan: [place] Barriers to Discharge: Continued Medical Work up  Expected Discharge Plan and Services Expected Discharge Plan: [place] Choice: [place] arrangements for the past 2 months: Single Family Home                                       Social Determinants of Health (SDOH) Interventions    Readmission Risk Interventions Readmission Risk Prevention Plan 04/27/2019  Transportation Screening Complete  PCP or Specialist appointment within 3-5 days of discharge Complete  SW Recovery Care/Counseling Consult Complete  Palliative Care Screening Complete  Skilled Nursing Facility Complete  Some recent data might be hidden

## 2019-04-29 ENCOUNTER — Telehealth: Payer: Self-pay | Admitting: *Deleted

## 2019-04-29 LAB — NOVEL CORONAVIRUS, NAA (HOSP ORDER, SEND-OUT TO REF LAB; TAT 18-24 HRS): SARS-CoV-2, NAA: NOT DETECTED

## 2019-04-29 NOTE — Telephone Encounter (Signed)
Pt was on TCM report admitted 04/25/19 for Acute respiratory failure with hypoxia - acute systolic CHF. CT chest w/o contrast in ED> Mild to moderate size bilateral pleural effusions left greater than right. Persistent heterogeneous masslike opacity over the left lower lobe with areas of fat density measuring 4.2 x 5.9 cm. This masslike opacity persists despite resolution of the previously seen diffuse bilateral nodular airspace process. This raises possibility frontal lying malignancy/adenocarcinoma. Pt d/c 04/28/19 to SNF...Johny Chess

## 2019-04-30 ENCOUNTER — Ambulatory Visit: Payer: Medicare Other | Admitting: Internal Medicine

## 2019-04-30 LAB — CULTURE, BLOOD (ROUTINE X 2)
Culture: NO GROWTH
Culture: NO GROWTH
Special Requests: ADEQUATE
Special Requests: ADEQUATE

## 2019-05-07 ENCOUNTER — Encounter: Payer: Self-pay | Admitting: Internal Medicine

## 2019-05-07 ENCOUNTER — Other Ambulatory Visit: Payer: Self-pay | Admitting: *Deleted

## 2019-05-07 ENCOUNTER — Telehealth: Payer: Self-pay | Admitting: Licensed Clinical Social Worker

## 2019-05-07 NOTE — Telephone Encounter (Signed)
Palliative Care SW has communicated with Chad Mcmillan, the SW at [facility] SN[facility] to schedule a Zoom meeting with patient.  Chad Mcmillan has not confirmed a date or time.  SW verified with the facility that they are not allowing any visitors or health care professionals into the building.

## 2019-05-07 NOTE — Patient Outreach (Addendum)
Member assessed for potential [facility] Care Management needs as a benefit of his [place] Medicare.  Mr. Depuy is currently receiving rehab therapy at [facility]. Chart reviewed and noted [facility] is also following member at [facility].   Spoke with [facility] RN who indicates facility reports that member's disposition plan is for home with home health services. Member lives with wife and has supportive sons that assist. Noted in hospital records that member's son Rhydian Baldi is HCPOA.   Writer to plan on outreaching to son/family for potential [facility] Care Management needs.  Will also outreach to facility discharge planner regarding disposition planning.   Marthenia Rolling, MSN-Ed, RN,BSN [place] Acute Care Coordinator [phone] [facility]) [phone]  (Toll free office)

## 2019-05-12 ENCOUNTER — Other Ambulatory Visit: Payer: Self-pay | Admitting: *Deleted

## 2019-05-12 ENCOUNTER — Other Ambulatory Visit: Payer: Medicare Other | Admitting: Licensed Clinical Social Worker

## 2019-05-12 DIAGNOSIS — Z515 Encounter for palliative care: Secondary | ICD-10-CM

## 2019-05-12 NOTE — Patient Outreach (Signed)
Member assessed for potential [facility] Care Management needs as a benefit of his [place] Medicare.  Chad Mcmillan is currently at [facility] receiving rehab therapy.   Facility discharge planner responded last week to writer stating that member's son Chad Mcmillan will be the best contact to discuss [place] Management services.   Telephone call to Chad Mcmillan at [phone]. Patient identifiers confirmed. Explained [place] Management services in detail.   Chad Mcmillan was very pleasant and honest. Chad Mcmillan states " I am just not sure what is going to happen with my father. He is not doing well currently and we are at our wits end. I do not want to say no to your services right now because we might   need you. Right now it is too early to tell."  Chad Mcmillan went on to say that the discharge plan, as it stands is for his father to return home with wife. Chad Mcmillan states they have caregivers currently for Chad Mcmillan wife because she cannot stay alone at all. States "in the event my father passes, we will not be able to maintain 24/7 care of my mom. We may have to look into long term care for her if my father passes." Chad Mcmillan also asked if his mother Chad Mcmillan is eligible for [place] management services.  Writer inquired whether or not [name] is active with palliative care services. Chad Mcmillan states "not that I am aware of. I thought we would be speaking with someone from palliative after hospital discharge. I would love to speak with them." Discussed that the current pandemic has put restraints on facility visits and the like. Chad Mcmillan expressed understanding of this.    Discussed that writer will pass Chad Mcmillan's request to speak with someone from [place] thru [place], with Chad Mcmillan's permission. Noted in chart that Palliative care team previously outreached to [facility]' discharge planner to arrange zoom meeting. Chad Mcmillan states he does not think his father will be able to fully participate in a palliative discussion due  to his current state.  Chad Mcmillan was very appreciative of writer's call. Writer's contact information was provided. Discussed that writer will follow back with him closer to Mr. Gilvin's discharge from [facility].   Sent notification to Chad Mcmillan with Community Palliative program with [facility] to make aware of Chad Mcmillan [place]'s request to please contact him regarding palliative care services.   Will also send update to [place] and [facility] discharge planner.   Will continue to follow for disposition plans, progression, and potential [facility] Care Management needs.    Chad Rolling, MSN-Ed, RN,BSN [place] Acute Care Coordinator [phone] [facility]) [phone]  (Toll free office)

## 2019-05-12 NOTE — Progress Notes (Signed)
COMMUNITY PALLIATIVE CARE SW NOTE  PATIENT NAME: Chad Mcmillan DOB: 03-01-1924 MRN: [id number]  PRIMARY CARE PROVIDER: Binnie Rail, MD  RESPONSIBLE PARTY:  Acct ID - Guarantor Home Phone Work Phone Relationship Acct Type  [id number] - COLVIN, BLATT [phone]  Self P/F     [place], [place], [place] [address]     PLAN OF CARE and INTERVENTIONS:             1. GOALS OF CARE/ ADVANCE CARE PLANNING:  Goal for son, Chad Mcmillan, is for patient to return home with his wife.  Patient is a full code. 2. SOCIAL/EMOTIONAL/SPIRITUAL ASSESSMENT/ INTERVENTIONS:  SW conducted a Sales executive visit with patient's son, Chad Mcmillan.  Provided active listening and supportive counseling.  He stated his parents have been married for 56 years.  Chad Mcmillan and his brother alternate every two days staying with his parents at home.  Chad Mcmillan expressed concern regarding a lung tumor his father has.  SW encouraged him to contact Palliative Care for any further concerns or questions. 3. PATIENT/CAREGIVER EDUCATION/ COPING:  Chad Mcmillan copes by expressing his feelings openly.  SW provided education regarding Palliative Care/THN.  He stated he was familiar with Hospice and Palliative Care. 4. PERSONAL EMERGENCY PLAN:  Per facility protocol. 5. COMMUNITY RESOURCES COORDINATION/ HEALTH CARE NAVIGATION:  None. 6. FINANCIAL/LEGAL CONCERNS/INTERVENTIONS:  None.     SOCIAL HX:  Social History   Tobacco Use  . Smoking status: Former Smoker    Quit date: 11/18/1978    Years since quitting: 40.5  . Smokeless tobacco: Never Used  Substance Use Topics  . Alcohol use: No    CODE STATUS:  Full Code  ADVANCED DIRECTIVES: N MOST FORM COMPLETE:  N HOSPICE EDUCATION PROVIDED:  N PPS: SW to assess during scheduled Zoom meeting tomorrow at facility. Duration of visit and documentation:  45 minutes.      Creola Corn Shakirra Buehler, LCSW

## 2019-05-13 ENCOUNTER — Other Ambulatory Visit: Payer: Self-pay

## 2019-05-14 ENCOUNTER — Telehealth: Payer: Self-pay | Admitting: Licensed Clinical Social Worker

## 2019-05-14 NOTE — Telephone Encounter (Signed)
Son Chad Mcmillan said pt is still at [employer] [employer].  Today is day 15. Chad Mcmillan's is arranging virtual visit for him with palliative care.  Per son it is okay to request virtual visit with [name].  Currently treating for UTI. Weak.     Called [facility].  Spoke with Chad Mcmillan, the supervisor who will assist pt with video visit. Use her mobile number: [phone]      Virtual Visit Pre-Appointment Phone Call  "(Name), I am calling you today to discuss your upcoming appointment. We are currently trying to limit exposure to the virus that causes COVID-19 by seeing patients at home rather than in the office."  1. "What is the BEST phone number to call the day of the visit?" - include this in appointment notes  2. "Do you have or have access to (through a family member/friend) a smartphone with video capability that we can use for your visit?" a. If yes - list this number in appt notes as "cell" (if different from BEST phone #) and list the appointment type as a VIDEO visit in appointment notes b. If no - list the appointment type as a PHONE visit in appointment notes  3. Confirm consent - "In the setting of the current Covid19 crisis, you are scheduled for a (phone or video) visit with your provider on (date) at (time).  Just as we do with many in-office visits, in order for you to participate in this visit, we must obtain consent.  If you'd like, I can send this to your mychart (if signed up) or email for you to review.  Otherwise, I can obtain your verbal consent now.  All virtual visits are billed to your insurance company just like a normal visit would be.  By agreeing to a virtual visit, we'd like you to understand that the technology does not allow for your provider to perform an examination, and thus may limit your provider's ability to fully assess your condition. If your provider identifies any concerns that need to be evaluated in person, we will make arrangements to do so.  Finally, though  the technology is pretty good, we cannot assure that it will always work on either your or our end, and in the setting of a video visit, we may have to convert it to a phone-only visit.  In either situation, we cannot ensure that we have a secure connection.  Are you willing to proceed?" THE PT'S SON, Chad Mcmillan IS IN AGREEMENT WITH VISIT- YES  4. Advise patient to be prepared - "Two hours prior to your appointment, go ahead and check your blood pressure, pulse, oxygen saturation, and your weight (if you have the equipment to check those) and write them all down. When your visit starts, your provider will ask you for this information. If you have an Apple Watch or Kardia device, please plan to have heart rate information ready on the day of your appointment. Please have a pen and paper handy nearby the day of the visit as well."  5. Give patient instructions for MyChart download to smartphone OR Doximity/Doxy.me as below if video visit (depending on what platform provider is using)  6. Inform patient they will receive a phone call 15 minutes prior to their appointment time (may be from unknown caller ID) so they should be prepared to answer    TELEPHONE CALL NOTE  DRESHON PROFFIT has been deemed a candidate for a follow-up tele-health visit to limit community exposure during the Covid-19 pandemic. I spoke  with the patient via phone to ensure availability of phone/video source, confirm preferred email & phone number, and discuss instructions and expectations.  I reminded Chad Mcmillan to be prepared with any vital sign and/or heart rhythm information that could potentially be obtained via home monitoring, at the time of his visit. I reminded Chad Mcmillan to expect a phone call prior to his visit.  Chad Key, RN 05/14/2019 11:40 AM   INSTRUCTIONS FOR DOWNLOADING THE MYCHART APP TO SMARTPHONE  - The patient must first make sure to have activated MyChart and know their login information - If Apple, go  to [employer] and type in MyChart in the search bar and download the app. If Android, ask patient to go to [employer] and type in [place] in the search bar and download the app. The app is free but as with any other app downloads, their phone may require them to verify saved payment information or Apple/Android password.  - The patient will need to then log into the app with their MyChart username and password, and select [facility] as their healthcare provider to link the account. When it is time for your visit, go to the MyChart app, find appointments, and click Begin Video Visit. Be sure to Select Allow for your device to access the Microphone and Camera for your visit. You will then be connected, and your provider will be with you shortly.  **If they have any issues connecting, or need assistance please contact MyChart service desk [phone])**  **If using a computer, in order to ensure the best quality for their visit they will need to use either of the following Internet Browsers: [employer], or Google Chrome**  IF USING DOXIMITY or DOXY.ME - The patient will receive a link just prior to their visit by text.     FULL LENGTH CONSENT FOR TELE-HEALTH VISIT   I hereby voluntarily request, consent and authorize [facility] and its employed or contracted physicians, physician assistants, nurse practitioners or other licensed health care professionals (the Practitioner), to provide me with telemedicine health care services (the "Services") as deemed necessary by the treating Practitioner. I acknowledge and consent to receive the Services by the Practitioner via telemedicine. I understand that the telemedicine visit will involve communicating with the Practitioner through live audiovisual communication technology and the disclosure of certain medical information by electronic transmission. I acknowledge that I have been given the opportunity to request an in-person  assessment or other available alternative prior to the telemedicine visit and am voluntarily participating in the telemedicine visit.  I understand that I have the right to withhold or withdraw my consent to the use of telemedicine in the course of my care at any time, without affecting my right to future care or treatment, and that the Practitioner or I may terminate the telemedicine visit at any time. I understand that I have the right to inspect all information obtained and/or recorded in the course of the telemedicine visit and may receive copies of available information for a reasonable fee.  I understand that some of the potential risks of receiving the Services via telemedicine include:  [place] Kitchen Delay or interruption in medical evaluation due to technological equipment failure or disruption; . Information transmitted may not be sufficient (e.g. poor resolution of images) to allow for appropriate medical decision making by the Practitioner; and/or  . In rare instances, security protocols could fail, causing a breach of personal health information.  Furthermore, I acknowledge that  it is my responsibility to provide information about my medical history, conditions and care that is complete and accurate to the best of my ability. I acknowledge that Practitioner's advice, recommendations, and/or decision may be based on factors not within their control, such as incomplete or inaccurate data provided by me or distortions of diagnostic images or specimens that may result from electronic transmissions. I understand that the practice of medicine is not an exact science and that Practitioner makes no warranties or guarantees regarding treatment outcomes. I acknowledge that I will receive a copy of this consent concurrently upon execution via email to the email address I last provided but may also request a printed copy by calling the office of [place].    I understand that my insurance will be billed for this  visit.   I have read or had this consent read to me. . I understand the contents of this consent, which adequately explains the benefits and risks of the Services being provided via telemedicine.  . I have been provided ample opportunity to ask questions regarding this consent and the Services and have had my questions answered to my satisfaction. . I give my informed consent for the services to be provided through the use of telemedicine in my medical care  By participating in this telemedicine visit I agree to the above.

## 2019-05-14 NOTE — Telephone Encounter (Signed)
Palliative Care SW received an email from [place] at [facility], yesterday.  She stated she would not be able to conduct a Zoom meeting with patient.

## 2019-05-20 ENCOUNTER — Other Ambulatory Visit: Payer: Self-pay

## 2019-05-20 ENCOUNTER — Telehealth (INDEPENDENT_AMBULATORY_CARE_PROVIDER_SITE_OTHER): Payer: Medicare Other | Admitting: Internal Medicine

## 2019-05-20 ENCOUNTER — Encounter: Payer: Self-pay | Admitting: Internal Medicine

## 2019-05-20 ENCOUNTER — Telehealth: Payer: Medicare Other | Admitting: Internal Medicine

## 2019-05-20 VITALS — BP 183/89 | HR 77 | Ht 72.0 in | Wt 133.7 lb

## 2019-05-20 DIAGNOSIS — I480 Paroxysmal atrial fibrillation: Secondary | ICD-10-CM | POA: Diagnosis not present

## 2019-05-20 DIAGNOSIS — I951 Orthostatic hypotension: Secondary | ICD-10-CM | POA: Diagnosis not present

## 2019-05-20 DIAGNOSIS — I5043 Acute on chronic combined systolic (congestive) and diastolic (congestive) heart failure: Secondary | ICD-10-CM

## 2019-05-20 NOTE — Patient Instructions (Addendum)
Patient Instructions:   WE HAVE REQUESTED LABS FROM THE NURSE AT [facility]. SHE IS GOING TO FAX OVER THESE RESULTS AND WE WILL GET BACK TO YOU WITH [name]' RECOMMENDATIONS

## 2019-05-20 NOTE — Progress Notes (Signed)
Virtual Visit via Video Note   This visit type was conducted due to national recommendations for restrictions regarding the COVID-19 Pandemic (e.g. social distancing) in an effort to limit this patient's exposure and mitigate transmission in our community.  Due to his co-morbid illnesses, this patient is at least at moderate risk for complications without adequate follow up.  This format is felt to be most appropriate for this patient at this time.  All issues noted in this document were discussed and addressed.  A limited physical exam was performed with this format.  Please refer to the patient's chart for his consent to telehealth for [facility].   Date:  05/20/2019   ID:  Chad Mcmillan, DOB 08-Oct-1924, MRN [id number]  Patient Location: [place] Provider Location: Home  PCP:  Binnie Rail, MD  Cardiologist:  Dorris Carnes, MD    Evaluation Performed:  Follow-Up Visit  Chief Complaint:  F/U of CHF    History of Present Illness:    Chad Mcmillan is a 85 y.o. male with with coronary artery disease, diabetes, hypertension, prior stroke, chronic kidney disease stage IV,  anemia, thrombocytopenia and dementia.  He had paroxysmal atrial fibrillation after surgery for hip fracture in August 2019.NOt on Xarelot due to hx anemia, bleeding risk    Pt also with a hx of diastoic and systolic CHF   He was admitted in Feb 2020 with CHF   Echo with LVEF of 25-30%.  It was suspected that his cardiomyopathy was ischemic. Invasive evaluation was deferred secondary to advanced renal disease and patient preference. He was also seen   I saw the pt in March  The pt was admitted to [facility] in June 2020 with resp failure, hypoxia felt due to CHF    Also orthostatic symptoms   CT showed density in lung that did not resolve from previous CT  Poss for underlying malignancy The pt was discharged to a SNF on 6/10  The pt is in bed today during televisit He is sleepy but answers questions    Nurse says he is a little confused   Sats have been good in upper 90s He is laying relatively flat   Asks why he is not on oxygen  Told him sats are goo  He deneis CP   Has some pain in feet   Breathing is short at times   NO dizziness No nausea  The patient does not have symptoms concerning for COVID-19 infection (fever, chills, cough, or new shortness of breath).    Past Medical History:  Diagnosis Date  . ADENOCARCINOMA, PROSTATE 06/01/2008  . CORONARY ARTERY DISEASE 06/13/2007  . CVA 06/01/2008   x 2  . DIABETES MELLITUS, TYPE I, WITH OPHTHALMIC COMPLICATIONS 05/09/2978  . 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 HYPERKALEMIA 06/01/2008   due to DM neparopathy  . HYPERLIPIDEMIA 06/13/2007  . HYPERTENSION 06/13/2007  . NEOPLASM, MALIGNANT, SKIN, FACE 06/01/2008  . Orthostatic hypotension 12/19/2018  . Other chronic nonalcoholic liver disease 8/92/1194  . RENAL DISEASE 06/01/2008   Past Surgical History:  Procedure Laterality Date  . Carotid Duplex  09/12/2004  . CATARACT EXTRACTION    . CHOLECYSTECTOMY    . ESOPHAGOGASTRODUODENOSCOPY  06/28/1991  . FEMUR IM NAIL Right 07/17/2018  . HERNIA REPAIR     right Inguinal hernia  . INTRAMEDULLARY (IM) NAIL INTERTROCHANTERIC N/A 07/17/2018   Procedure: INTRAMEDULLARY (IM) NAIL INTERTROCHANTRIC;  Surgeon: Leandrew Koyanagi, MD;  Location: [facility];  Service: Orthopedics;  Laterality: N/A;  . Rest/Stress Cardiolite  01/22/2001     Current Meds  Medication Sig  . acetaminophen (TYLENOL) 325 MG tablet Take 2 tablets (650 mg total) by mouth every 4 (four) hours as needed for headache or mild pain.  . Amino Acids-Protein Hydrolys (FEEDING SUPPLEMENT, PRO-STAT SUGAR FREE 64,) LIQD Take 30 mLs by mouth 2 (two) times a day.  Marland Kitchen atorvastatin (LIPITOR) 40 MG tablet TAKE 2 TABLETS BY MOUTH ONCE DAILY AT 6 IN THE EVENING (Patient taking differently: Take 80 mg by mouth daily at 6 PM. )  . cyanocobalamin 1000 MCG tablet Take 1,000 mcg by mouth daily.  . Ferrous Gluconate 324  (37.5 Fe) MG TABS Take 324 mg by mouth daily.   . furosemide (LASIX) 40 MG tablet Take 40 mg by mouth every other day.   Marland Kitchen glucose blood (ONE TOUCH ULTRA TEST) test strip Use 1 test strip to monitor glucose levels BID; E11.9; NPI [id number]  . isosorbide-hydrALAZINE (BIDIL) 20-37.5 MG tablet Take 1 tablet by mouth 3 (three) times daily.  . metoprolol tartrate (LOPRESSOR) 25 MG tablet Take 1 tablet (25 mg total) by mouth 2 (two) times daily.  . nitroGLYCERIN (NITROSTAT) 0.4 MG SL tablet Place 1 tablet (0.4 mg total) under the tongue every 5 (five) minutes as needed. For chest pain  . NON FORMULARY becubi vite  . polyethylene glycol (MIRALAX / GLYCOLAX) packet Take 17 g by mouth daily.  . repaglinide (PRANDIN) 0.5 MG tablet Take 1 tablet (0.5 mg total) by mouth 2 (two) times daily before a meal.  . senna-docusate (SENOKOT-S) 8.6-50 MG tablet Take 1 tablet by mouth at bedtime as needed for mild constipation or moderate constipation.     Allergies:   Patient has no known allergies.   Social History   Tobacco Use  . Smoking status: Former Smoker    Quit date: 11/18/1978    Years since quitting: 40.5  . Smokeless tobacco: Never Used  Substance Use Topics  . Alcohol use: No  . Drug use: No     Family Hx: The patient's family history includes Vision loss in his son.  ROS:   Please see the history of present illness.     All other systems reviewed and are negative.   Prior CV studies:   The following studies were reviewed today:    Labs/Other Tests and Data Reviewed:    EKG:  No ECG reviewed.  Recent Labs: 12/19/2018: Magnesium 2.0 12/29/2018: Pro B Natriuretic peptide (BNP) 1,328.0 04/25/2019: ALT 22; B Natriuretic Peptide 2,964.3 04/27/2019: TSH 7.135 04/28/2019: BUN 76; Creatinine, Ser 2.85; Hemoglobin 8.1; Platelets 98; Potassium 5.1; Sodium 137   Recent Lipid Panel Lab Results  Component Value Date/Time   CHOL 119 12/14/2018 02:15 PM   TRIG 86 04/25/2019 12:39 PM   TRIG 51  09/29/2006 08:01 AM   HDL 45.80 12/14/2018 02:15 PM   CHOLHDL 3 12/14/2018 02:15 PM   LDLCALC 44 12/14/2018 02:15 PM    Wt Readings from Last 3 Encounters:  05/20/19 133 lb 11.2 oz (60.6 kg)  04/28/19 149 lb 4.8 oz (67.7 kg)  01/27/19 151 lb 9.6 oz (68.8 kg)     Objective:    Vital Signs:  BP (!) 183/89   Pulse 77   Ht 6' (1.829 m)   Wt 133 lb 11.2 oz (60.6 kg)   BMI 18.13 kg/m    VITAL SIGNS:  reviewed  Difficult to assess JVP  Trace edema on ankles  ASSESSMENT & PLAN:  1. Systolic CHF  Pt with severe systolic CHF   Nurse says he is on low salt diet   WIll review labs from admit  ? If adjust lasix.   No changes for now  2  CAD  Pt with known CAD    Presumed etiology for worseing EF   Plan for conservative Rx  Pt is without CP  3  PAF  Denies palpitations  4  HTN  BP is good   5   CKD    WIll review labs    COVID-19 Education:  Pt in facility   [place] wearing masks  Time:   Today, I have spent 15 minutes with the patient with telehealth technology discussing the above problems.     Medication Adjustments/Labs and Tests Ordered: Current medicines are reviewed at length with the patient today.  Concerns regarding medicines are outlined above.   Tests Ordered: No orders of the defined types were placed in this encounter.   Medication Changes: No orders of the defined types were placed in this encounter.   Follow Up:  F/U in later summer WIll review labs    Signed, Dorris Carnes, MD  05/20/2019 11:50 AM    [place]

## 2019-05-21 NOTE — Progress Notes (Signed)
Cardiology Office Note   Date:  05/21/2019   ID:  Chad Mcmillan, DOB [birth year], MRN [id number]  PCP:  Binnie Rail, MD  Cardiologist:   Dorris Carnes, MD       History of Present Illness: Chad Mcmillan is a 85 y.o. male with a history of       Current Meds  Medication Sig  . acetaminophen (TYLENOL) 325 MG tablet Take 2 tablets (650 mg total) by mouth every 4 (four) hours as needed for headache or mild pain.  . Amino Acids-Protein Hydrolys (FEEDING SUPPLEMENT, PRO-STAT SUGAR FREE 64,) LIQD Take 30 mLs by mouth 2 (two) times a day.  Marland Kitchen atorvastatin (LIPITOR) 40 MG tablet TAKE 2 TABLETS BY MOUTH ONCE DAILY AT 6 IN THE EVENING (Patient taking differently: Take 80 mg by mouth daily at 6 PM. )  . cyanocobalamin 1000 MCG tablet Take 1,000 mcg by mouth daily.  . Ferrous Gluconate 324 (37.5 Fe) MG TABS Take 324 mg by mouth daily.   . furosemide (LASIX) 40 MG tablet Take 40 mg by mouth every other day.   Marland Kitchen glucose blood (ONE TOUCH ULTRA TEST) test strip Use 1 test strip to monitor glucose levels BID; E11.9; NPI [id number]  . isosorbide-hydrALAZINE (BIDIL) 20-37.5 MG tablet Take 1 tablet by mouth 3 (three) times daily.  . metoprolol tartrate (LOPRESSOR) 25 MG tablet Take 1 tablet (25 mg total) by mouth 2 (two) times daily.  . nitroGLYCERIN (NITROSTAT) 0.4 MG SL tablet Place 1 tablet (0.4 mg total) under the tongue every 5 (five) minutes as needed. For chest pain  . NON FORMULARY becubi vite  . polyethylene glycol (MIRALAX / GLYCOLAX) packet Take 17 g by mouth daily.  . repaglinide (PRANDIN) 0.5 MG tablet Take 1 tablet (0.5 mg total) by mouth 2 (two) times daily before a meal.  . senna-docusate (SENOKOT-S) 8.6-50 MG tablet Take 1 tablet by mouth at bedtime as needed for mild constipation or moderate constipation.     Allergies:   Patient has no known allergies.   Past Medical History:  Diagnosis Date  . ADENOCARCINOMA, PROSTATE 06/01/2008  . CORONARY ARTERY DISEASE 06/13/2007  . CVA  06/01/2008   x 2  . DIABETES MELLITUS, TYPE I, WITH OPHTHALMIC COMPLICATIONS 2/63/3354  . 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 HYPERKALEMIA 06/01/2008   due to DM neparopathy  . HYPERLIPIDEMIA 06/13/2007  . HYPERTENSION 06/13/2007  . NEOPLASM, MALIGNANT, SKIN, FACE 06/01/2008  . Orthostatic hypotension 12/19/2018  . Other chronic nonalcoholic liver disease 5/62/5638  . RENAL DISEASE 06/01/2008    Past Surgical History:  Procedure Laterality Date  . Carotid Duplex  09/12/2004  . CATARACT EXTRACTION    . CHOLECYSTECTOMY    . ESOPHAGOGASTRODUODENOSCOPY  06/28/1991  . FEMUR IM NAIL Right 07/17/2018  . HERNIA REPAIR     right Inguinal hernia  . INTRAMEDULLARY (IM) NAIL INTERTROCHANTERIC N/A 07/17/2018   Procedure: INTRAMEDULLARY (IM) NAIL INTERTROCHANTRIC;  Surgeon: Leandrew Koyanagi, MD;  Location: [facility];  Service: Orthopedics;  Laterality: N/A;  . Rest/Stress Cardiolite  01/22/2001     Social History:  The patient  reports that he quit smoking about 40 years ago. He has never used smokeless tobacco. He reports that he does not drink alcohol or use drugs.   Family History:  The patient's family history includes Vision loss in his son.    ROS:  Please see the history of present illness. All other systems are reviewed and  Negative to the above problem  except as noted.    PHYSICAL EXAM: VS:  BP (!) 183/89   Pulse 77   Ht 6' (1.829 m)   Wt 133 lb 11.2 oz (60.6 kg)   BMI 18.13 kg/m   GEN: Well nourished, well developed, in no acute distress  HEENT: normal  Neck: no JVD, carotid bruits, or masses Cardiac: RRR; no murmurs, rubs, or gallops,no edema  Respiratory:  clear to auscultation bilaterally, normal work of breathing GI: soft, nontender, nondistended, + BS  No hepatomegaly  MS: no deformity Moving all extremities   Skin: warm and dry, no rash Neuro:  Strength and sensation are intact Psych: euthymic mood, full affect   EKG:  EKG is ordered today.   Lipid Panel    Component Value  Date/Time   CHOL 119 12/14/2018 1415   TRIG 86 04/25/2019 1239   TRIG 51 09/29/2006 0801   HDL 45.80 12/14/2018 1415   CHOLHDL 3 12/14/2018 1415   VLDL 29.6 12/14/2018 1415   LDLCALC 44 12/14/2018 1415      Wt Readings from Last 3 Encounters:  05/20/19 133 lb 11.2 oz (60.6 kg)  04/28/19 149 lb 4.8 oz (67.7 kg)  01/27/19 151 lb 9.6 oz (68.8 kg)      ASSESSMENT AND PLAN:     Current medicines are reviewed at length with the patient today.  The patient does not have concerns regarding medicines.  Signed, Dorris Carnes, MD  05/21/2019 8:21 AM    [facility] [address], [place], [place]  [address] Phone: [phone]; Fax: [phone]

## 2019-06-01 ENCOUNTER — Telehealth: Payer: Self-pay

## 2019-06-01 ENCOUNTER — Other Ambulatory Visit: Payer: Self-pay | Admitting: *Deleted

## 2019-06-01 NOTE — Patient Outreach (Signed)
Member assessed for potential [facility] Care Management needs as a benefit of [place] [employer].  Updated information received from [place] indicating, per facility, Mr. Ledvina is declining medically and labs and xrays are being ordered. Mr. Vidales is currently at [facility].   [facility] UM RN requests that writer follow up with member's son Louie Casa regarding disposition plans.  Telephone call made to Kassem Kibbe at [phone]. Patient identifiers confirmed. Louie Casa remembers speaking with Probation officer in the recent past.  Louie Casa confirms that he has spoken with his father and "he doesn't sound good. I can tell by his breathing and his coughing."  Louie Casa states he doesn't really want his father to return to the hospital. Louie Casa states his main goal is for his father and mother to reunite at [place] [facility]. States " I want them to be together as long as they can." Louie Casa states he is hopeful that member will be able to transition to ALF.  Louie Casa states he understands that there is "nothing they can do" regarding Mr. Alabi's lung mass. Discussed that it sounds like his goal is inline with hospice. Louie Casa is agreeable to [place] Team reaching out to him again regarding palliative vs hospice services.  Telephone call made to [facility] with Palliative services with Manufacturing engineer to discuss member's situation. Request made, if possible, that  [employer] Palliative team outreach to member's son to discuss goals again.  Will continue to collaborate with [facility] UM team, SNF facility, [facility], and member's family.   Marthenia Rolling, MSN-Ed, RN,BSN [place] Acute Care Coordinator [phone] [facility]) [phone]  (Toll free office)

## 2019-06-01 NOTE — Telephone Encounter (Signed)
Phone call received from [place] with [facility]. Received report that patient has displayed decline and worsening symptoms. Primary Palliative team updated.

## 2019-06-08 ENCOUNTER — Other Ambulatory Visit: Payer: Self-pay | Admitting: *Deleted

## 2019-06-08 NOTE — Patient Outreach (Signed)
Made aware by [facility] UM that Mr. Friedel unfortunately expired at [facility].   Will sign off for [facility] Management services.   Marthenia Rolling, MSN-Ed, RN,BSN [place] Acute Care Coordinator [phone] [facility]) [phone]  (Toll free office)

## 2019-06-19 DEATH — deceased

## 2019-07-05 ENCOUNTER — Ambulatory Visit: Payer: Medicare Other | Admitting: Endocrinology

## 2019-10-05 NOTE — Progress Notes (Signed)
Kolton, Kienle ([id number]) Visit Report for 02/16/2019 Debridement Details Patient Name: Date of Service: Clent, Damore 02/16/2019 12:30 PM Medical Record [id number] Patient Account Number: Date of Birth/Sex: February 22, 1924 (85 y.o. Male) Treating RN: Carlene Coria Primary Care Provider: Billey Gosling Other Clinician: Referring Provider: Treating Provider/Extender:Robson, Eduard Roux, Naiping Weeks in Treatment: 19 Debridement Performed for Wound #1 Right Calcaneus Assessment: Performed By: Physician Ricard Dillon., MD Debridement Type: Debridement Severity of Tissue Pre Fat layer exposed Debridement: Level of Consciousness (Pre- Awake and Alert procedure): Pre-procedure Verification/Time Out Taken: Yes - 13:09 Start Time: 13:09 Pain Control: Lidocaine 4% Topical Solution Total Area Debrided (L x W): 0.2 (cm) x 0.2 (cm) = 0.04 (cm) Tissue and other material Viable, Non-Viable, Slough, Subcutaneous, Skin: Dermis , Skin: Epidermis, Slough debrided: Level: Skin/Subcutaneous Tissue Debridement Description: Excisional Instrument: Curette Bleeding: Minimum Hemostasis Achieved: Pressure End Time: 13:11 Procedural Pain: 0 Post Procedural Pain: 0 Response to Treatment: Procedure was tolerated well Level of Consciousness Awake and Alert (Post-procedure): Post Debridement Measurements of Total Wound Length: (cm) 0.2 Width: (cm) 0.2 Depth: (cm) 0.2 Volume: (cm) 0.006 Character of Wound/Ulcer Post Improved Debridement: Severity of Tissue Post Debridement: Fat layer exposed Post Procedure Diagnosis Same as Pre-procedure Electronic Signature(s) Signed: 02/16/2019 2:21:53 PM By: Linton Ham MD Signed: 04/06/2019 1:55:11 PM By: Carlene Coria RN Entered By: Linton Ham on 02/16/2019 14:03:30 -------------------------------------------------------------------------------- HPI Details Patient Name: Date of Service: [name] 02/16/2019 12:30 PM Medical Record  [id number] Patient Account Number: Date of Birth/Sex: 1924-07-24 (85 y.o. Male) Treating RN: Carlene Coria Primary Care Provider: Billey Gosling Other Clinician: Referring Provider: Treating Provider/Extender:Robson, Eduard Roux, Naiping Weeks in Treatment: 19 History of Present Illness HPI Description: ADMISSION 10/01/2018 This is a 85 year old man who lives at home with his wife. He has in-home caregiving through [employer]. He has a Marine scientist changing his dressing. The history is that he suffered a right hip fracture at the end of August 2019 and came home with a pressure related area on his heel. He is a type II diabetic with known diabetic neuropathy. He does not have a history of PAD. They are using silver alginate to the wound. He was at [facility] skilled facility for a period of time and they gave him a heel offloading boot but it was apparently too cumbersome for him to walk in. The patient has a past history of stage IV chronic renal failure, type 2 diabetes on oral agents, atrial fibrillation, right hip fracture and 07/17/2018, anemia, dementia, skin cancer of the face, coronary artery disease, hypertension and right inguinal hernia ABIs in our clinic were 0.95 on the right and noncompressible on the left 10/26/2018; patient readmitted to the clinic 3-1/2 weeks ago. He has a diabetic ulcer on his right heel. We have been using silver collagen. We have home health changing the dressing. He has a heel offloading boot 11/02/2018; diabetic ulcer on the right heel. We have been using silver collagen. He has a heel offloading boot. Most of this wound is superficial over the superior one third still has a significant amount of necrotic tissue that is requiring debridement. 11/16/2018; diabetic ulcer on the right heel. We have been using silver collagen. He has a heel offloading boot. Most of the wound is superficially however superiorly there is still a considerable amount of depth and  necrotic tissue. This does not probe to bone. 11/24/2018. Right heel ulcer. Been using silver collagen not much in the way of improvement largely because of a punched  out area they continuously has adherent necrotic material. Today he arrives with a small superficial wound at the base of his right great toenail. The toenail is mycotic but I do not think this had anything to do with the area here. The patient states he is not really sure how this happened 1/13; right heel ulcer. Using silver collagen. Culture I did last time grew a few enterococcus, Providencia and MRSA. As the medial area of this wound on his heel had gone down quite far I have elected to aggressively treat this. I have not ordered an x-ray. 1/20; right heel ulcer. We have been using silver collagen. He still has the same area which looks like a, on the tip of his heel [not a weightbearing surface. The head of the, has considerable depth and recurrent necrotic debris which I am having to debride. Changed him to Iodoflex today 1/28; right heel ulcer. Switch to Iodoflex last week. This is on the tip of his heel but not really a weightbearing surface. The small deeper area still has necrotic debris but looks better. This does not probe to bone. 2/10; right heel ulcer. Since the patient was last in the clinic he was admitted to hospital for congestive heart failure. We have still been using Iodoflex on the heel wound which generally looks smaller. The small deeper area that is still open most of the rest of this is closed there is no probing to bone 2/17; right heel ulcer. The patient has a small open area with about 2 mm of depth is although that is left of this. This does not probe there is no undermining 3/2 right heel ulcer. Not as good this week. Necrotic debris in the wound including some purulence which I have cultured. Undermining 3/9 right heel ulcer. Drainage that was cultured last week grew MRSA. The cephalexin I gave him  empirically I changed to doxycycline on Friday. Things look a lot better this week. Better looking surface and no purulent drainage. I put him on silver alginate last week, changed to silver collagen today 3/16- RIght heel ulcer follow up, doing better, no drainage, very minimal debris, slightly callused lower edge, Continue silver collagen this week 3/31; right heel ulcer follow-up. Using silver collagen. Electronic Signature(s) Signed: 02/16/2019 2:21:53 PM By: Linton Ham MD Entered By: Linton Ham on 02/16/2019 14:03:55 -------------------------------------------------------------------------------- Physical Exam Details Patient Name: Date of Service: Yuki, Purves 02/16/2019 12:30 PM Medical Record [id number] Patient Account Number: Date of Birth/Sex: 02/19/1924 (85 y.o. Male) Treating RN: Carlene Coria Primary Care Provider: Billey Gosling Other Clinician: Referring Provider: Treating Provider/Extender:Robson, Eduard Roux, Naiping Weeks in Treatment: 52 Constitutional Patient is hypertensive.. Pulse regular and within target range for patient.Marland Kitchen Respirations regular, non-labored and within target range.. Temperature is normal and within the target range for the patient.. Notes Wound exam right heel. Eschar around the wound edge removed with a #3 curette and necrotic debris from the wound surface. Hemostasis with direct pressure there is no evidence of surrounding infection Electronic Signature(s) Signed: 02/16/2019 2:21:53 PM By: Linton Ham MD Entered By: Linton Ham on 02/16/2019 14:04:59 -------------------------------------------------------------------------------- Physician Orders Details Patient Name: Date of Service: Umair, Rosiles 02/16/2019 12:30 PM Medical Record [id number] Patient Account Number: Date of Birth/Sex: 1924/06/22 (85 y.o. Male) Treating RN: Carlene Coria Primary Care Provider: Billey Gosling Other Clinician: Referring Provider:  Treating Provider/Extender:Robson, Eduard Roux, Naiping Weeks in Treatment: 19 Verbal / Phone Orders: No Diagnosis Coding ICD-10 Coding Code Description E11.621 Type 2 diabetes mellitus with  foot ulcer E11.42 Type 2 diabetes mellitus with diabetic polyneuropathy L97.412 Non-pressure chronic ulcer of right heel and midfoot with fat layer exposed L97.511 Non-pressure chronic ulcer of other part of right foot limited to breakdown of skin L03.115 Cellulitis of right lower limb Electronic Signature(s) Signed: 04/06/2019 1:55:11 PM By: Carlene Coria RN Signed: 10/05/2019 8:26:20 AM By: Linton Ham MD Previous Signature: 02/16/2019 2:21:53 PM Version By: Linton Ham MD Entered By: Carlene Coria on 03/02/2019 12:11:09 -------------------------------------------------------------------------------- Problem List Details Patient Name: Date of Service: [name]. 02/16/2019 12:30 PM Medical Record [id number] Patient Account Number: Date of Birth/Sex: 15-Jan-1924 (85 y.o. Male) Treating RN: Carlene Coria Primary Care Provider: Billey Gosling Other Clinician: Referring Provider: Treating Provider/Extender:Robson, Eduard Roux, Naiping Weeks in Treatment: 19 Active Problems ICD-10 Evaluated Encounter Code Description Active Date Today Diagnosis E11.621 Type 2 diabetes mellitus with foot ulcer 10/01/2018 No Yes E11.42 Type 2 diabetes mellitus with diabetic polyneuropathy 10/01/2018 No Yes L97.412 Non-pressure chronic ulcer of right heel and midfoot 10/26/2018 No Yes with fat layer exposed L97.511 Non-pressure chronic ulcer of other part of right foot 11/24/2018 No Yes limited to breakdown of skin L03.115 Cellulitis of right lower limb 11/30/2018 No Yes Inactive Problems ICD-10 Code Description Active Date Inactive Date L97.411 Non-pressure chronic ulcer of right heel and midfoot limited to 10/01/2018 10/01/2018 breakdown of skin Resolved Problems Electronic Signature(s) Signed:  02/16/2019 2:21:53 PM By: Linton Ham MD Entered By: Linton Ham on 02/16/2019 14:03:02 -------------------------------------------------------------------------------- Progress Note Details Patient Name: Date of Service: [name] 02/16/2019 12:30 PM Medical Record [id number] Patient Account Number: Date of Birth/Sex: 1924-11-18 (85 y.o. Male) Treating RN: Carlene Coria Primary Care Provider: Billey Gosling Other Clinician: Referring Provider: Treating Provider/Extender:Robson, Eduard Roux, Naiping Weeks in Treatment: 19 Subjective History of Present Illness (HPI) ADMISSION 10/01/2018 This is a 85 year old man who lives at home with his wife. He has in-home caregiving through [employer]. He has a Marine scientist changing his dressing. The history is that he suffered a right hip fracture at the end of August 2019 and came home with a pressure related area on his heel. He is a type II diabetic with known diabetic neuropathy. He does not have a history of PAD. They are using silver alginate to the wound. He was at [facility] skilled facility for a period of time and they gave him a heel offloading boot but it was apparently too cumbersome for him to walk in. The patient has a past history of stage IV chronic renal failure, type 2 diabetes on oral agents, atrial fibrillation, right hip fracture and 07/17/2018, anemia, dementia, skin cancer of the face, coronary artery disease, hypertension and right inguinal hernia ABIs in our clinic were 0.95 on the right and noncompressible on the left 10/26/2018; patient readmitted to the clinic 3-1/2 weeks ago. He has a diabetic ulcer on his right heel. We have been using silver collagen. We have home health changing the dressing. He has a heel offloading boot 11/02/2018; diabetic ulcer on the right heel. We have been using silver collagen. He has a heel offloading boot. Most of this wound is superficial over the superior one third still has a  significant amount of necrotic tissue that is requiring debridement. 11/16/2018; diabetic ulcer on the right heel. We have been using silver collagen. He has a heel offloading boot. Most of the wound is superficially however superiorly there is still a considerable amount of depth and necrotic tissue. This does not probe to bone. 11/24/2018. Right heel ulcer. Been  using silver collagen not much in the way of improvement largely because of a punched out area they continuously has adherent necrotic material. ooToday he arrives with a small superficial wound at the base of his right great toenail. The toenail is mycotic but I do not think this had anything to do with the area here. The patient states he is not really sure how this happened 1/13; right heel ulcer. Using silver collagen. Culture I did last time grew a few enterococcus, Providencia and MRSA. As the medial area of this wound on his heel had gone down quite far I have elected to aggressively treat this. I have not ordered an x-ray. 1/20; right heel ulcer. We have been using silver collagen. He still has the same area which looks like a, on the tip of his heel [not a weightbearing surface. The head of the, has considerable depth and recurrent necrotic debris which I am having to debride. Changed him to Iodoflex today 1/28; right heel ulcer. Switch to Iodoflex last week. This is on the tip of his heel but not really a weightbearing surface. The small deeper area still has necrotic debris but looks better. This does not probe to bone. 2/10; right heel ulcer. Since the patient was last in the clinic he was admitted to hospital for congestive heart failure. We have still been using Iodoflex on the heel wound which generally looks smaller. The small deeper area that is still open most of the rest of this is closed there is no probing to bone 2/17; right heel ulcer. The patient has a small open area with about 2 mm of depth is although that is  left of this. This does not probe there is no undermining 3/2 right heel ulcer. Not as good this week. Necrotic debris in the wound including some purulence which I have cultured. Undermining 3/9 right heel ulcer. Drainage that was cultured last week grew MRSA. The cephalexin I gave him empirically I changed to doxycycline on Friday. Things look a lot better this week. Better looking surface and no purulent drainage. I put him on silver alginate last week, changed to silver collagen today 3/16- RIght heel ulcer follow up, doing better, no drainage, very minimal debris, slightly callused lower edge, Continue silver collagen this week 3/31; right heel ulcer follow-up. Using silver collagen. Objective Constitutional Patient is hypertensive.. Pulse regular and within target range for patient.Marland Kitchen Respirations regular, non-labored and within target range.. Temperature is normal and within the target range for the patient.. Vitals Time Taken: 12:40 PM, Height: 69 in, Source: Stated, Weight: 164 lbs, Source: Stated, BMI: 24.2, Temperature: 97.7 F, Pulse: 62 bpm, Respiratory Rate: 18 breaths/min, Blood Pressure: 163/42 mmHg, Capillary Blood Glucose: 127 mg/dl. General Notes: glucose per pt report this am General Notes: Wound exam right heel. Eschar around the wound edge removed with a #3 curette and necrotic debris from the wound surface. Hemostasis with direct pressure there is no evidence of surrounding infection Integumentary (Hair, Skin) Wound #1 status is Open. Original cause of wound was Pressure Injury. The wound is located on the Right Calcaneus. The wound measures 0.2cm length x 0.2cm width x 0.2cm depth; 0.031cm^2 area and 0.006cm^3 volume. There is Fat Layer (Subcutaneous Tissue) Exposed exposed. There is no tunneling or undermining noted. There is a small amount of serous drainage noted. The wound margin is flat and intact. There is large (67-100%) red granulation within the wound bed.  There is no necrotic tissue within the wound bed. The  periwound skin appearance exhibited: Callus. The periwound skin appearance did not exhibit: Crepitus, Excoriation, Induration, Rash, Scarring, Dry/Scaly, Maceration, Atrophie Blanche, Cyanosis, Ecchymosis, Hemosiderin Staining, Mottled, Pallor, Rubor, Erythema. Periwound temperature was noted as No Abnormality. Assessment Active Problems ICD-10 Type 2 diabetes mellitus with foot ulcer Type 2 diabetes mellitus with diabetic polyneuropathy Non-pressure chronic ulcer of right heel and midfoot with fat layer exposed Non-pressure chronic ulcer of other part of right foot limited to breakdown of skin Cellulitis of right lower limb Procedures Wound #1 Pre-procedure diagnosis of Wound #1 is a Diabetic Wound/Ulcer of the Lower Extremity located on the Right Calcaneus .Severity of Tissue Pre Debridement is: Fat layer exposed. There was a Excisional Skin/Subcutaneous Tissue Debridement with a total area of 0.04 sq cm performed by Ricard Dillon., MD. With the following instrument(s): Curette to remove Viable and Non-Viable tissue/material. Material removed includes Subcutaneous Tissue, Slough, Skin: Dermis, and Skin: Epidermis after achieving pain control using Lidocaine 4% Topical Solution. No specimens were taken. A time out was conducted at 13:09, prior to the start of the procedure. A Minimum amount of bleeding was controlled with Pressure. The procedure was tolerated well with a pain level of 0 throughout and a pain level of 0 following the procedure. Post Debridement Measurements: 0.2cm length x 0.2cm width x 0.2cm depth; 0.006cm^3 volume. Character of Wound/Ulcer Post Debridement is improved. Severity of Tissue Post Debridement is: Fat layer exposed. Post procedure Diagnosis Wound #1: Same as Pre-Procedure Plan Follow-up Appointments: Return Appointment in 2 weeks. Dressing Change Frequency: Wound #1 Right Calcaneus: Change dressing  three times week. Wound Cleansing: Wound #1 Right Calcaneus: May shower and wash wound with soap and water. Primary Wound Dressing: Wound #1 Right Calcaneus: Silver Collagen - moisten with saline or hydrogel Secondary Dressing: Wound #1 Right Calcaneus: Foam - heel cup Kerlix/Rolled Gauze Dry Gauze Off-Loading: Heel suspension boot to: - globoped heel offloading sandal right foot Turn and reposition every 2 hours Other: - float heels off bed and chair with pillows under calves Home Health: [facility] for wound care. - [facility]. Continue with silver collagen 2. Dimensions of the wound seem better Electronic Signature(s) Signed: 02/16/2019 2:21:53 PM By: Linton Ham MD Entered By: Linton Ham on 02/16/2019 14:05:30 -------------------------------------------------------------------------------- SuperBill Details Patient Name: Date of Service: [name] 02/16/2019 Medical Record [id number] Patient Account Number: Date of Birth/Sex: Treating RN: April 22, 1924 (85 y.o. Male) Carlene Coria Primary Care Provider: Billey Gosling Other Clinician: Referring Provider: Treating Provider/Extender:Robson, Eduard Roux, Naiping Weeks in Treatment: 19 Diagnosis Coding ICD-10 Codes Code Description E11.621 Type 2 diabetes mellitus with foot ulcer E11.42 Type 2 diabetes mellitus with diabetic polyneuropathy L97.412 Non-pressure chronic ulcer of right heel and midfoot with fat layer exposed L97.511 Non-pressure chronic ulcer of other part of right foot limited to breakdown of skin L03.115 Cellulitis of right lower limb Facility Procedures CPT4 Code Description: [id number] - DEB SUBQ TISSUE 20 SQ CM/< ICD-10 Diagnosis Description L97.412 Non-pressure chronic ulcer of right heel and midfoot with Modifier: fat layer expo Quantity: 1 sed Physician Procedures CPT4 Code Description: [id number] - WC PHYS SUBQ TISS 20 SQ CM ICD-10 Diagnosis Description L97.412  Non-pressure chronic ulcer of right heel and midfoot with Modifier: fat layer expo Quantity: 1 sed Electronic Signature(s) Signed: 02/16/2019 2:21:53 PM By: Linton Ham MD Entered By: Linton Ham on 02/16/2019 14:06:21

## 2019-11-07 IMAGING — DX DG CHEST 1V PORT
1 series · 1 of 1 positions shown · non-contrast
Comparison: 12/30/2017 chest radiograph

CLINICAL DATA: [AGE]/o M; increased confusion and weakness over a
few months.

EXAM:
PORTABLE CHEST 1 VIEW

[chest ap]
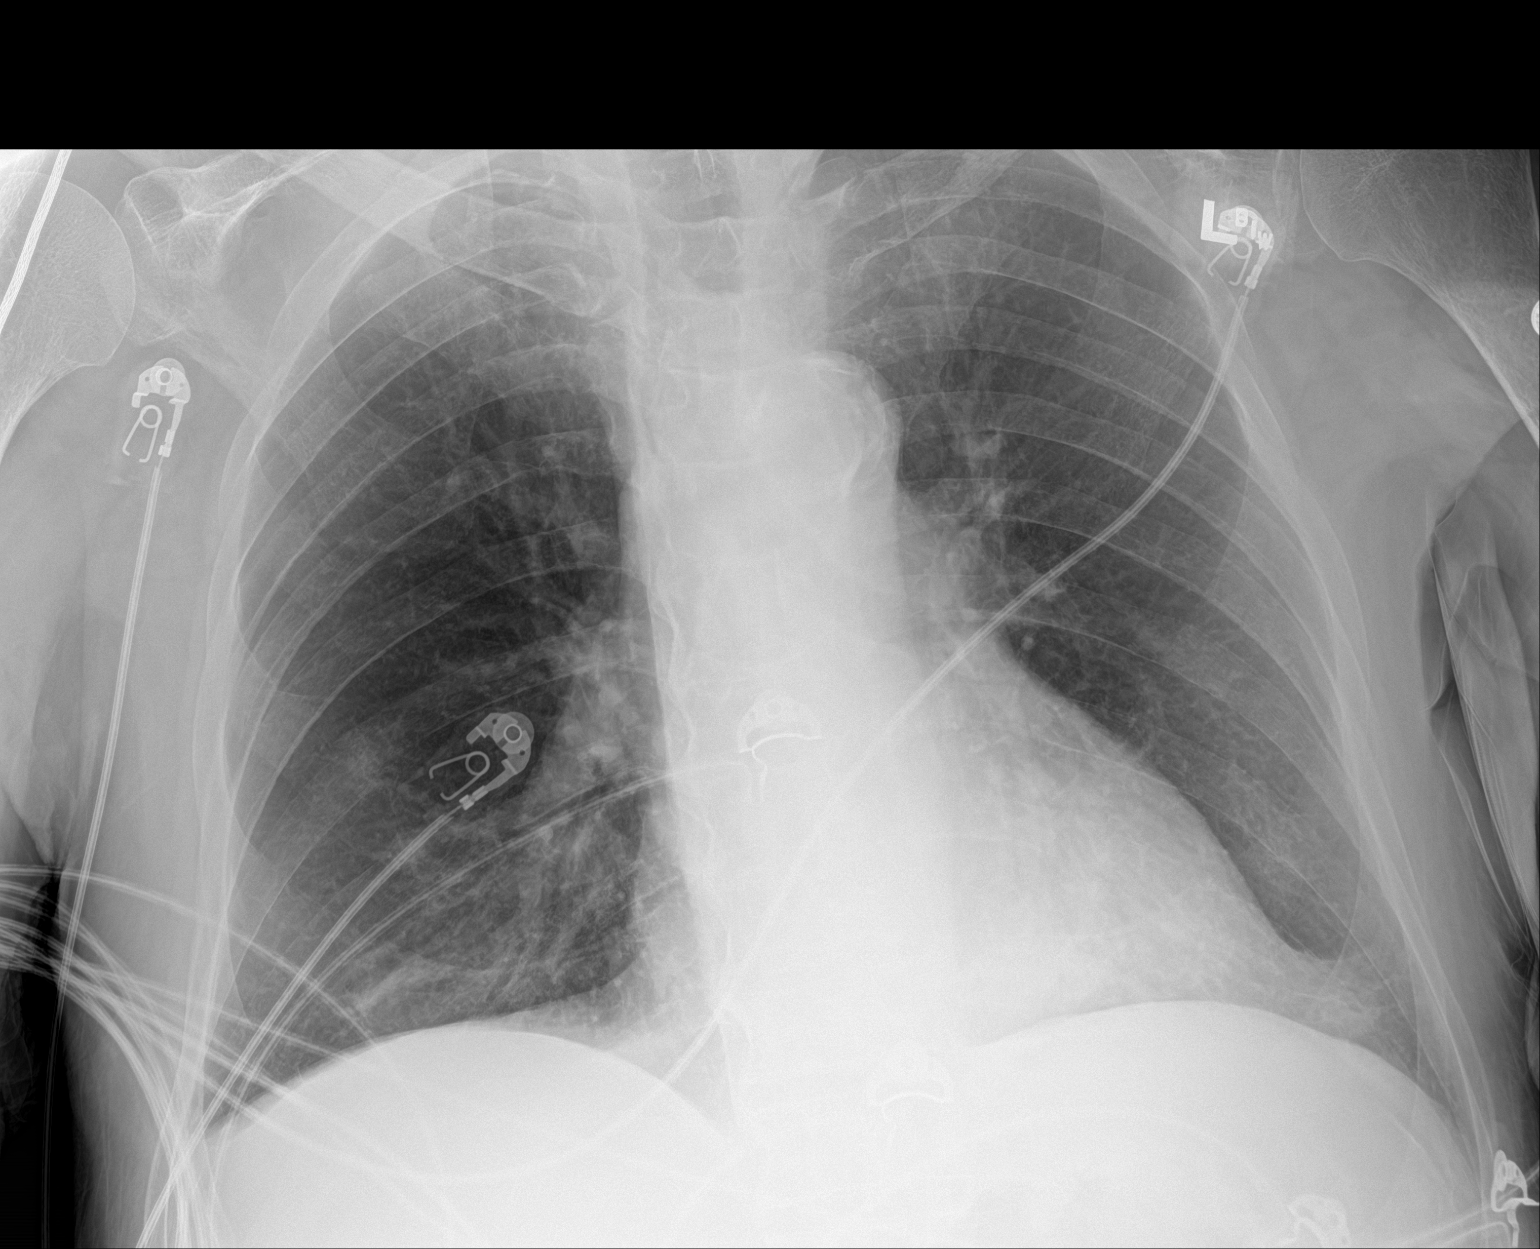

[1 of 1 positions shown; findings below may reference images not displayed]

FINDINGS: Stable normal cardiac silhouette given projection and technique.
Aortic atherosclerosis with calcification. Improved aeration of
right middle lobe. No new focal consolidation. Emphysema. No pleural
effusion or pneumothorax. No acute osseous abnormality is evident.
IMPRESSION: Improved aeration in right middle lobe. No new focal consolidation.
Aortic atherosclerosis. Stable cardiac silhouette.

By: Kimmona Friginette M.D.

## 2019-11-07 IMAGING — CT CT HEAD W/O CM
4 series · 16 of 47 positions shown, 18 images · non-contrast
Comparison: Brain MRI 12/30/2017.  Head CT 11/15/2017

CLINICAL DATA: Altered level of consciousness.

EXAM:
CT HEAD WITHOUT CONTRAST
TECHNIQUE: Contiguous axial images were obtained from the base of the skull
through the vertex without intravenous contrast.

[Series 3: head wo · axial · 0.45mm/px · z∈[-107,+18]mm · 6 of 36 slices shown, 8 images]
[im 6/36  brain]
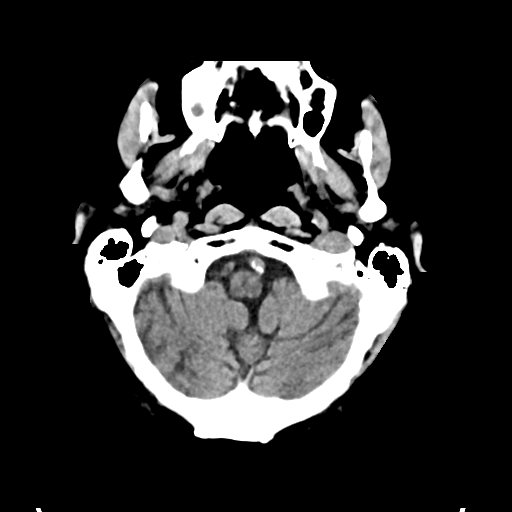
[im 6/36  bone]
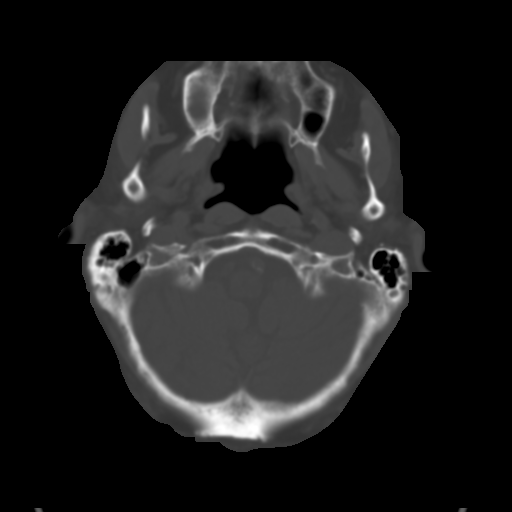
[im 11/36  brain]
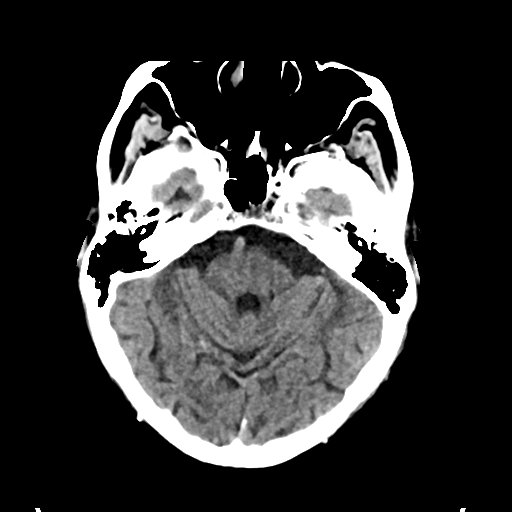
[im 16/36  brain]
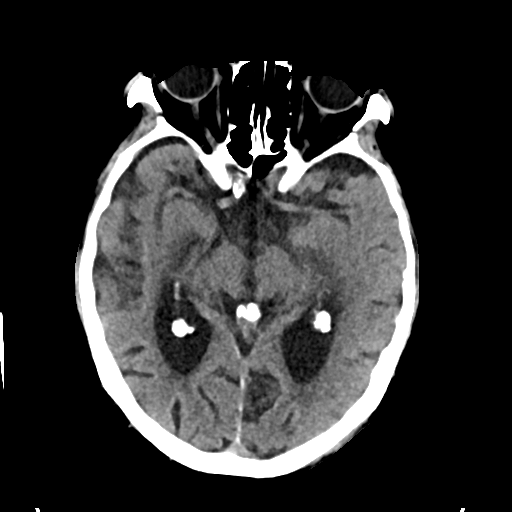
[im 21/36  brain]
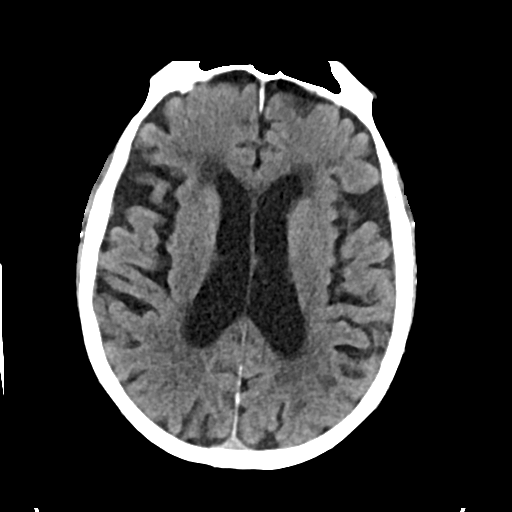
[im 26/36  brain]
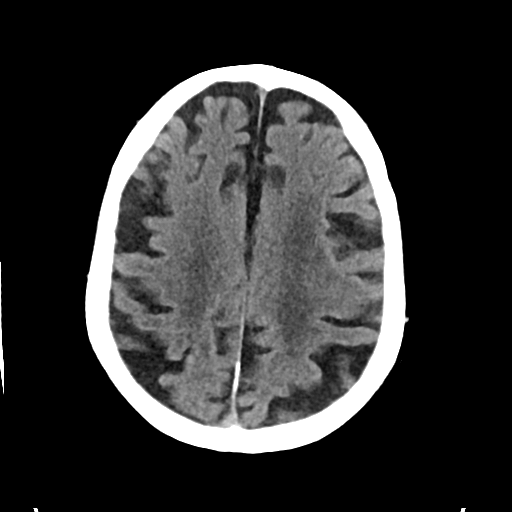
[im 26/36  bone]
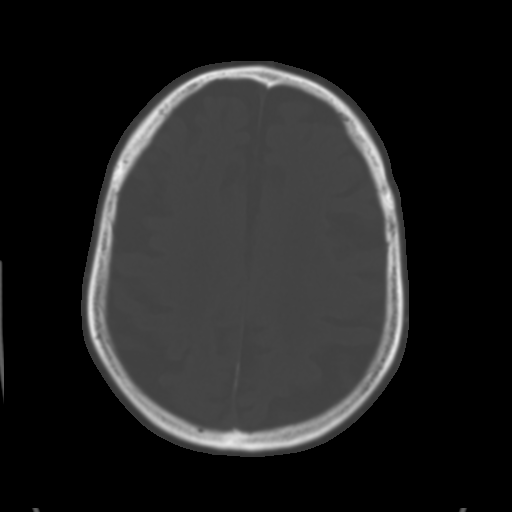
[im 31/36  brain]
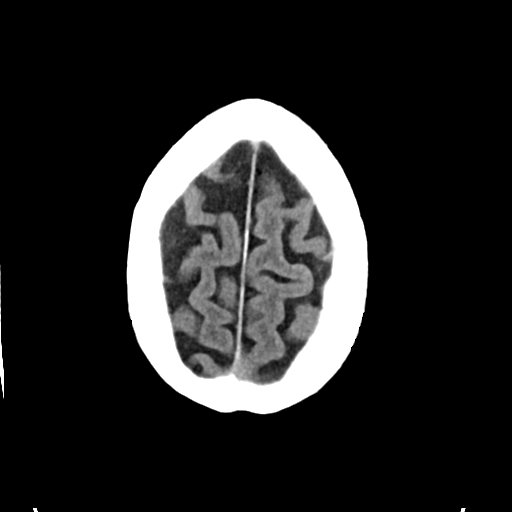

[Series 4: head bone · axial · 0.45mm/px · z∈[-116,-54]mm · 4 of 93 slices shown]
[im 9/93  bone]
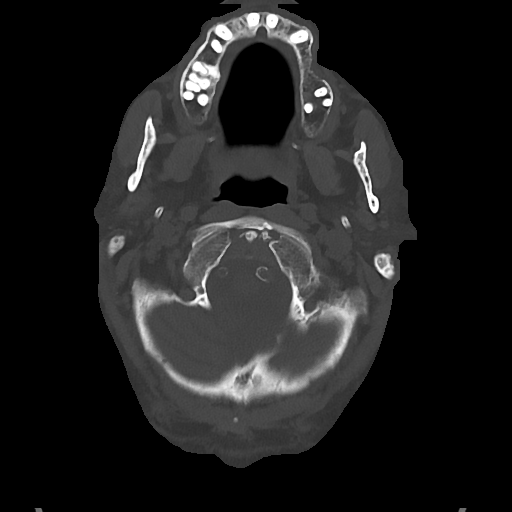
[im 18/93  bone]
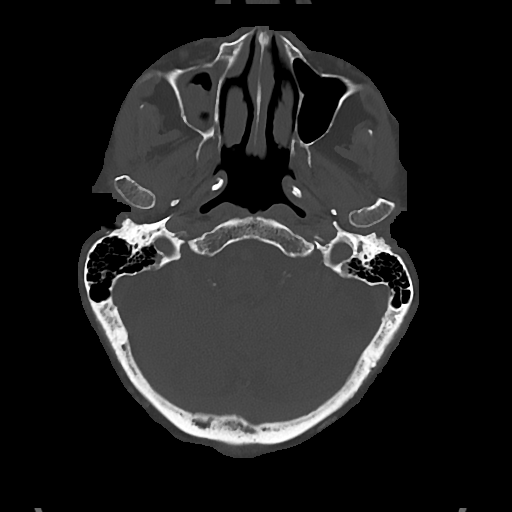
[im 31/93  bone]
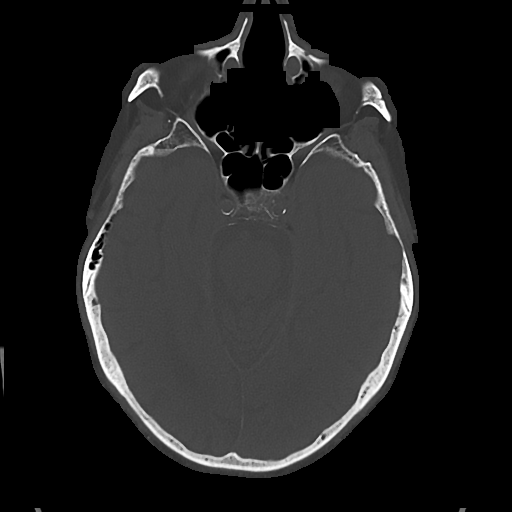
[im 40/93  bone]
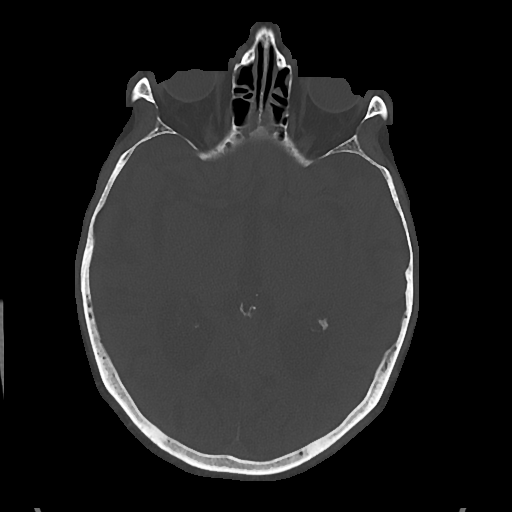

[Series 5: cor soft · coronal · 0.37mm/px · 3 of 73 slices shown]
[im 25/73  brain]
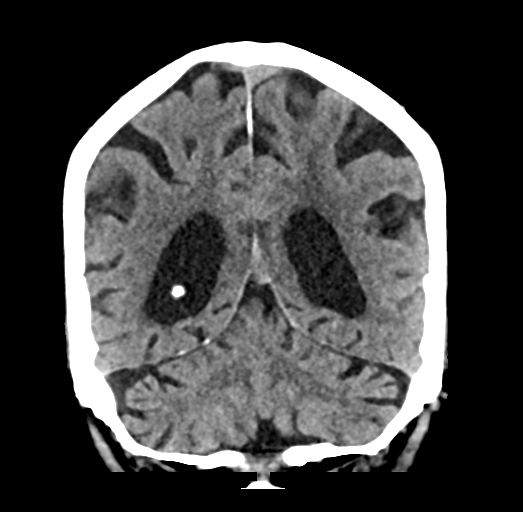
[im 33/73  brain]
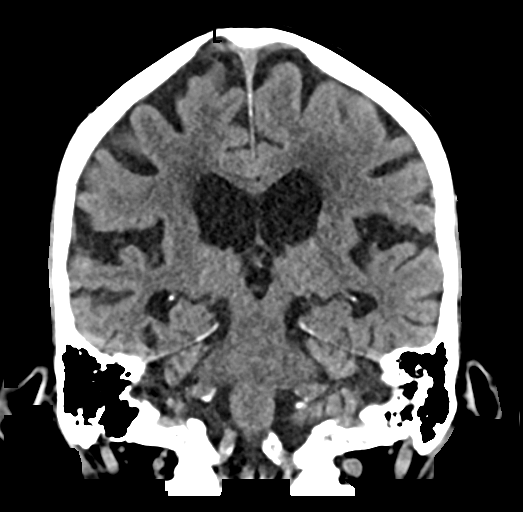
[im 41/73  brain]
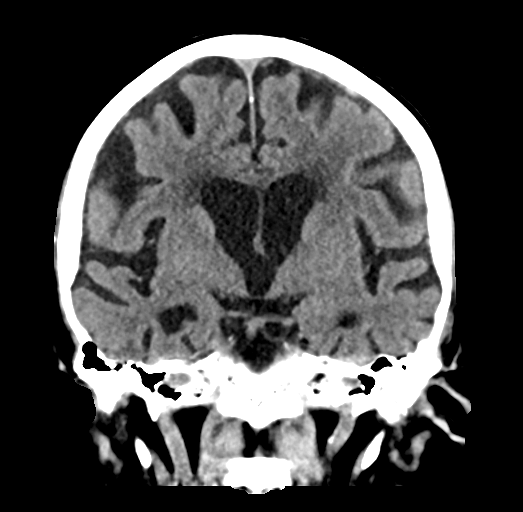

[Series 6: sag soft · sagittal · 0.37mm/px · 3 of 62 slices shown]
[im 21/62  brain]
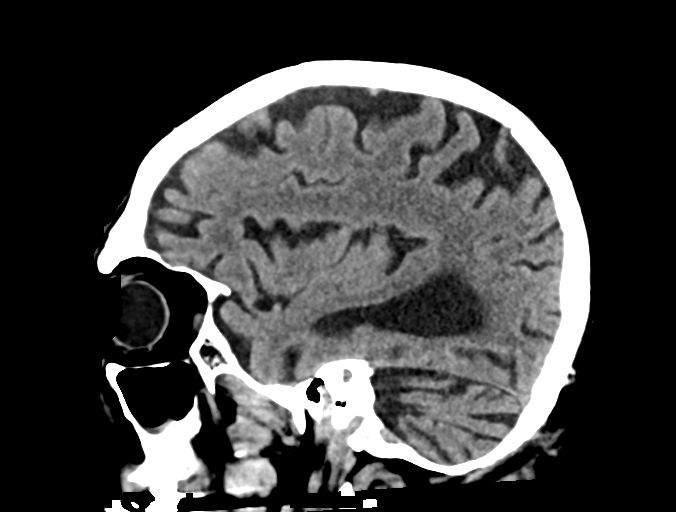
[im 31/62  brain]
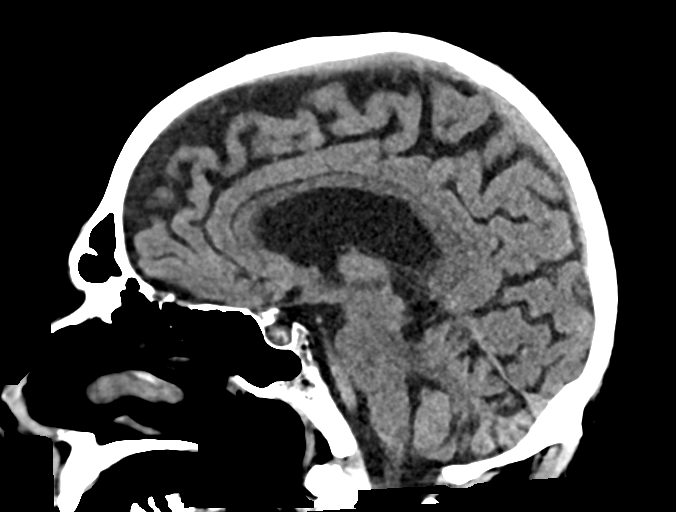
[im 41/62  brain]
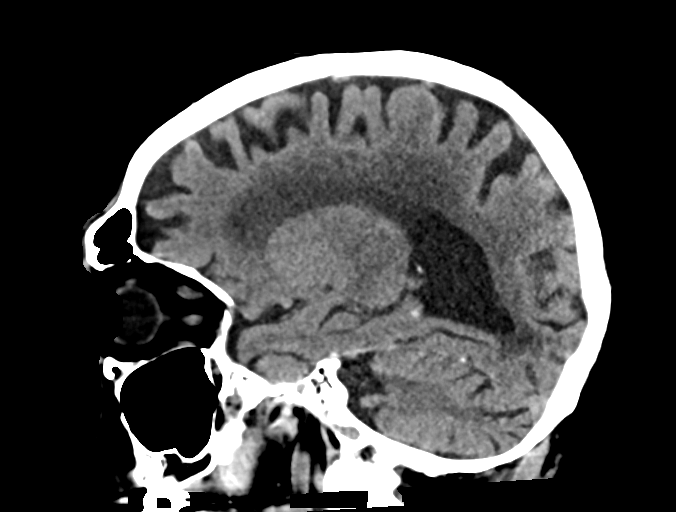

[16 of 47 positions shown; findings below may reference images not displayed]

FINDINGS: Brain: Generalized atrophy unchanged from prior exam. Cerebral and
cerebellar chronic small vessel ischemia, also unchanged. No
intracranial hemorrhage, mass effect, or midline shift. No
hydrocephalus. The basilar cisterns are patent. No evidence of
territorial infarct or acute ischemia. No extra-axial or
intracranial fluid collection.

Vascular: Atherosclerosis of skullbase vasculature without
hyperdense vessel or abnormal calcification.

Skull: No fracture or focal lesion.

Sinuses/Orbits: Mucosal thickening in the right maxillary sinus,
chronic. No acute finding.

Other: None.
IMPRESSION: 1.  No acute intracranial abnormality.
2. Unchanged atrophy and chronic small vessel ischemia.
